# Patient Record
Sex: Female | Born: 1950 | Race: White | Hispanic: No | Marital: Married | Smoking: Never smoker
Health system: Southern US, Community
[De-identification: ages and names within clinical notes are randomized; demographics above are authoritative.]

## PROBLEM LIST (undated history)

## (undated) DIAGNOSIS — M8580 Other specified disorders of bone density and structure, unspecified site: Secondary | ICD-10-CM

## (undated) DIAGNOSIS — K219 Gastro-esophageal reflux disease without esophagitis: Secondary | ICD-10-CM

## (undated) DIAGNOSIS — H3090 Unspecified chorioretinal inflammation, unspecified eye: Secondary | ICD-10-CM

## (undated) DIAGNOSIS — D509 Iron deficiency anemia, unspecified: Secondary | ICD-10-CM

## (undated) DIAGNOSIS — Z8719 Personal history of other diseases of the digestive system: Secondary | ICD-10-CM

## (undated) DIAGNOSIS — I219 Acute myocardial infarction, unspecified: Secondary | ICD-10-CM

## (undated) DIAGNOSIS — N289 Disorder of kidney and ureter, unspecified: Secondary | ICD-10-CM

## (undated) DIAGNOSIS — R790 Abnormal level of blood mineral: Secondary | ICD-10-CM

## (undated) DIAGNOSIS — I10 Essential (primary) hypertension: Secondary | ICD-10-CM

## (undated) DIAGNOSIS — I471 Supraventricular tachycardia, unspecified: Secondary | ICD-10-CM

## (undated) DIAGNOSIS — K222 Esophageal obstruction: Secondary | ICD-10-CM

## (undated) DIAGNOSIS — H547 Unspecified visual loss: Secondary | ICD-10-CM

## (undated) DIAGNOSIS — I213 ST elevation (STEMI) myocardial infarction of unspecified site: Secondary | ICD-10-CM

## (undated) DIAGNOSIS — E785 Hyperlipidemia, unspecified: Secondary | ICD-10-CM

## (undated) DIAGNOSIS — M810 Age-related osteoporosis without current pathological fracture: Secondary | ICD-10-CM

## (undated) DIAGNOSIS — F419 Anxiety disorder, unspecified: Secondary | ICD-10-CM

## (undated) DIAGNOSIS — E039 Hypothyroidism, unspecified: Secondary | ICD-10-CM

## (undated) DIAGNOSIS — E871 Hypo-osmolality and hyponatremia: Secondary | ICD-10-CM

## (undated) DIAGNOSIS — E538 Deficiency of other specified B group vitamins: Secondary | ICD-10-CM

## (undated) DIAGNOSIS — D649 Anemia, unspecified: Secondary | ICD-10-CM

## (undated) DIAGNOSIS — R112 Nausea with vomiting, unspecified: Secondary | ICD-10-CM

## (undated) DIAGNOSIS — Z9889 Other specified postprocedural states: Secondary | ICD-10-CM

## (undated) DIAGNOSIS — N189 Chronic kidney disease, unspecified: Secondary | ICD-10-CM

## (undated) HISTORY — DX: Supraventricular tachycardia, unspecified: I47.10

## (undated) HISTORY — DX: Abnormal level of blood mineral: R79.0

## (undated) HISTORY — PX: COLONOSCOPY: SHX174

## (undated) HISTORY — DX: Esophageal obstruction: K22.2

## (undated) HISTORY — DX: Disorder of kidney and ureter, unspecified: N28.9

## (undated) HISTORY — PX: HERNIA REPAIR: SHX51

## (undated) HISTORY — DX: Unspecified visual loss: H54.7

## (undated) HISTORY — DX: Hypothyroidism, unspecified: E03.9

## (undated) HISTORY — DX: Iron deficiency anemia, unspecified: D50.9

## (undated) HISTORY — DX: Supraventricular tachycardia: I47.1

## (undated) HISTORY — DX: Chronic kidney disease, unspecified: N18.9

## (undated) HISTORY — PX: BACK SURGERY: SHX140

## (undated) HISTORY — PX: UPPER GASTROINTESTINAL ENDOSCOPY: SHX188

## (undated) HISTORY — DX: Hyperlipidemia, unspecified: E78.5

## (undated) HISTORY — PX: CARPAL TUNNEL RELEASE: SHX101

## (undated) HISTORY — DX: Anemia, unspecified: D64.9

## (undated) HISTORY — DX: Hypo-osmolality and hyponatremia: E87.1

## (undated) HISTORY — DX: Other specified disorders of bone density and structure, unspecified site: M85.80

## (undated) HISTORY — DX: Unspecified chorioretinal inflammation, unspecified eye: H30.90

## (undated) HISTORY — DX: Deficiency of other specified B group vitamins: E53.8

## (undated) NOTE — *Deleted (*Deleted)
Transition of Care [facility]) - Initial/Assessment Note    Patient Details  Name: Jessica [name] MRN: [id number] Date of Birth: April 25, 1951  Transition of Care [facility]) CM/SW Contact:    Ella Bodo, RN Phone Number: 05/15/2020, 3:51 PM  Clinical Narrative:  24 y.o. year-old female with a history of CAD with MI and congenital blindness presenting to the ED with chief complaint of fall against countertop. Pt found to have large left abdominal wall hematoma.  PTA, pt independent, lives at home with spouse.  PT recommending OP follow up at discharge.                 Expected Discharge Plan: OP Rehab Barriers to Discharge: Continued Medical Work up   Patient Goals and CMS Choice        Expected Discharge Plan and Services Expected Discharge Plan: OP Rehab   Discharge Planning Services: CM Consult                                          Prior Living Arrangements/Services   Lives with:: Spouse Patient language and need for interpreter reviewed:: Yes Do you feel safe going back to the place where you live?: Yes      Need for Family Participation in Patient Care: Yes (Comment) Care giver support system in place?: Yes (comment)   Criminal Activity/Legal Involvement Pertinent to Current Situation/Hospitalization: No - Comment as needed  Activities of Daily Living Home Assistive Devices/Equipment: Communication device (specify type) (Talking clock) ADL Screening (condition at time of admission) Patient's cognitive ability adequate to safely complete daily activities?: Yes Is the patient deaf or have difficulty hearing?: No Does the patient have difficulty seeing, even when wearing glasses/contacts?: Yes Does the patient have difficulty concentrating, remembering, or making decisions?: No Patient able to express need for assistance with ADLs?: Yes Does the patient have difficulty dressing or bathing?: No Independently performs ADLs?: Yes (appropriate for developmental  age) Does the patient have difficulty walking or climbing stairs?: No Weakness of Legs: None Weakness of Arms/Hands: None  Permission Sought/Granted                  Emotional Assessment Appearance:: Appears stated age Attitude/Demeanor/Rapport: Engaged Affect (typically observed): Accepting Orientation: : Oriented to Self, Oriented to Place, Oriented to  Time, Oriented to Situation      Admission diagnosis:  Fall [W19.XXXA] Anticoagulated [Z79.01] Abdominal wall hematoma [S30.1XXA] Abdominal wall hematoma, initial encounter [S30.1XXA] Patient Active Problem List   Diagnosis Date Noted  . Abdominal wall hematoma 05/13/2020   PCP:  Marton Redwood, MD Pharmacy:  No Pharmacies Listed    Social Determinants of Health (SDOH) Interventions    Readmission Risk Interventions No flowsheet data found.

---
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
## 1983-07-27 HISTORY — PX: CHOLECYSTECTOMY: SHX55

## 1991-07-27 HISTORY — PX: OTHER SURGICAL HISTORY: SHX169

## 1991-07-27 HISTORY — PX: TOTAL ABDOMINAL HYSTERECTOMY W/ BILATERAL SALPINGOOPHORECTOMY: SHX83

## 1998-05-11 ENCOUNTER — Emergency Department (HOSPITAL_COMMUNITY): Admission: EM | Admit: 1998-05-11 | Discharge: 1998-05-11 | Payer: Self-pay | Admitting: Emergency Medicine

## 1999-10-11 ENCOUNTER — Inpatient Hospital Stay (HOSPITAL_COMMUNITY): Admission: EM | Admit: 1999-10-11 | Discharge: 1999-10-16 | Payer: Self-pay | Admitting: Emergency Medicine

## 1999-10-11 ENCOUNTER — Encounter: Payer: Self-pay | Admitting: Otolaryngology

## 1999-10-12 ENCOUNTER — Encounter: Payer: Self-pay | Admitting: Otolaryngology

## 1999-12-16 ENCOUNTER — Encounter: Admission: RE | Admit: 1999-12-16 | Discharge: 1999-12-16 | Payer: Self-pay | Admitting: Surgery

## 1999-12-16 ENCOUNTER — Encounter: Payer: Self-pay | Admitting: Surgery

## 2005-01-12 ENCOUNTER — Encounter: Admission: RE | Admit: 2005-01-12 | Discharge: 2005-01-12 | Payer: Self-pay | Admitting: Internal Medicine

## 2005-05-14 ENCOUNTER — Encounter: Admission: RE | Admit: 2005-05-14 | Discharge: 2005-05-14 | Payer: Self-pay | Admitting: Internal Medicine

## 2005-05-26 ENCOUNTER — Encounter: Admission: RE | Admit: 2005-05-26 | Discharge: 2005-05-26 | Payer: Self-pay | Admitting: Internal Medicine

## 2005-06-07 ENCOUNTER — Encounter (INDEPENDENT_AMBULATORY_CARE_PROVIDER_SITE_OTHER): Payer: Self-pay | Admitting: Specialist

## 2005-06-07 ENCOUNTER — Encounter: Admission: RE | Admit: 2005-06-07 | Discharge: 2005-06-07 | Payer: Self-pay | Admitting: Internal Medicine

## 2005-06-07 ENCOUNTER — Other Ambulatory Visit: Admission: RE | Admit: 2005-06-07 | Discharge: 2005-06-07 | Payer: Self-pay | Admitting: Interventional Radiology

## 2005-07-26 HISTORY — PX: OTHER SURGICAL HISTORY: SHX169

## 2005-07-29 ENCOUNTER — Emergency Department (HOSPITAL_COMMUNITY): Admission: EM | Admit: 2005-07-29 | Discharge: 2005-07-29 | Payer: Self-pay | Admitting: Emergency Medicine

## 2005-07-31 ENCOUNTER — Emergency Department (HOSPITAL_COMMUNITY): Admission: EM | Admit: 2005-07-31 | Discharge: 2005-07-31 | Payer: Self-pay | Admitting: Emergency Medicine

## 2005-09-02 ENCOUNTER — Encounter (INDEPENDENT_AMBULATORY_CARE_PROVIDER_SITE_OTHER): Payer: Self-pay | Admitting: Specialist

## 2005-09-02 ENCOUNTER — Ambulatory Visit (HOSPITAL_COMMUNITY): Admission: RE | Admit: 2005-09-02 | Discharge: 2005-09-03 | Payer: Self-pay | Admitting: Surgery

## 2005-10-17 ENCOUNTER — Emergency Department (HOSPITAL_COMMUNITY): Admission: EM | Admit: 2005-10-17 | Discharge: 2005-10-17 | Payer: Self-pay | Admitting: Emergency Medicine

## 2006-02-01 ENCOUNTER — Encounter: Admission: RE | Admit: 2006-02-01 | Discharge: 2006-02-01 | Payer: Self-pay | Admitting: Internal Medicine

## 2006-04-17 ENCOUNTER — Emergency Department (HOSPITAL_COMMUNITY): Admission: EM | Admit: 2006-04-17 | Discharge: 2006-04-18 | Payer: Self-pay | Admitting: Emergency Medicine

## 2006-12-29 ENCOUNTER — Inpatient Hospital Stay (HOSPITAL_COMMUNITY): Admission: EM | Admit: 2006-12-29 | Discharge: 2006-12-30 | Payer: Self-pay | Admitting: Emergency Medicine

## 2006-12-29 ENCOUNTER — Ambulatory Visit: Payer: Self-pay | Admitting: Cardiology

## 2006-12-30 ENCOUNTER — Encounter: Payer: Self-pay | Admitting: Cardiology

## 2007-01-13 ENCOUNTER — Encounter: Admission: RE | Admit: 2007-01-13 | Discharge: 2007-01-13 | Payer: Self-pay | Admitting: Internal Medicine

## 2007-02-03 ENCOUNTER — Ambulatory Visit: Payer: Self-pay | Admitting: Internal Medicine

## 2007-02-08 ENCOUNTER — Ambulatory Visit: Payer: Self-pay | Admitting: Internal Medicine

## 2007-02-08 DIAGNOSIS — K219 Gastro-esophageal reflux disease without esophagitis: Secondary | ICD-10-CM | POA: Insufficient documentation

## 2007-02-08 DIAGNOSIS — K222 Esophageal obstruction: Secondary | ICD-10-CM | POA: Insufficient documentation

## 2007-02-08 DIAGNOSIS — K449 Diaphragmatic hernia without obstruction or gangrene: Secondary | ICD-10-CM | POA: Insufficient documentation

## 2007-02-16 ENCOUNTER — Ambulatory Visit: Payer: Self-pay | Admitting: Cardiology

## 2007-02-24 ENCOUNTER — Ambulatory Visit: Payer: Self-pay | Admitting: Internal Medicine

## 2007-03-06 ENCOUNTER — Ambulatory Visit (HOSPITAL_COMMUNITY): Admission: RE | Admit: 2007-03-06 | Discharge: 2007-03-06 | Payer: Self-pay | Admitting: Internal Medicine

## 2007-03-06 ENCOUNTER — Encounter: Payer: Self-pay | Admitting: Internal Medicine

## 2007-03-14 ENCOUNTER — Ambulatory Visit: Payer: Self-pay | Admitting: Internal Medicine

## 2007-11-15 DIAGNOSIS — E119 Type 2 diabetes mellitus without complications: Secondary | ICD-10-CM | POA: Insufficient documentation

## 2007-11-15 DIAGNOSIS — H5310 Unspecified subjective visual disturbances: Secondary | ICD-10-CM | POA: Insufficient documentation

## 2007-11-15 DIAGNOSIS — E118 Type 2 diabetes mellitus with unspecified complications: Secondary | ICD-10-CM | POA: Insufficient documentation

## 2007-11-15 DIAGNOSIS — H547 Unspecified visual loss: Secondary | ICD-10-CM | POA: Insufficient documentation

## 2007-11-15 DIAGNOSIS — E785 Hyperlipidemia, unspecified: Secondary | ICD-10-CM | POA: Insufficient documentation

## 2007-11-15 DIAGNOSIS — E039 Hypothyroidism, unspecified: Secondary | ICD-10-CM | POA: Insufficient documentation

## 2010-12-08 ENCOUNTER — Other Ambulatory Visit: Payer: Self-pay | Admitting: Internal Medicine

## 2010-12-08 DIAGNOSIS — Z1231 Encounter for screening mammogram for malignant neoplasm of breast: Secondary | ICD-10-CM

## 2010-12-08 NOTE — Assessment & Plan Note (Signed)
[facility]                         GASTROENTEROLOGY OFFICE NOTE   NAME:Jessica [name], Jessica [name]                      MRN:          [id number]  DATE:02/03/2007                            DOB:          [birth year]    REFERRING PHYSICIAN:  Janalyn Rouse, M.D.   OFFICE CONSULTATION NOTE.   REASON FOR CONSULTATION:  Reflux disease, dysphagia, iron deficiency  anemia.   HISTORY:  This is a 60 year old white female with a history of  hypothyroidism, dyslipidemia, type 2 diabetes mellitus, gastroesophageal  reflux disease complicated by peptic stricture, and visual impairment.  She is referred through the courtesy of Dr. Brigitte Pulse regarding the above  listed problems.  The patient was recently noted to have a microcytic  anemia with a hemoglobin of 9.1, MCV of 78.1.  White blood cell count  and platelets were normal.  Ferritin level was low at 21.  The patient  has been on B12, though I do not see a B12 level documented.  This has  not affected her hemoglobin.  She is intolerant to oral iron therapy.  She has undergone prior GI evaluations in [place].  Approximately 3  years ago she underwent upper endoscopy with esophageal dilatation  because of dysphagia.  This helped.  She also underwent colonoscopy.  She cannot recall that she had any problem such as polyps.  For her  reflux, currently she is on Protonix 40 mg daily.  This controls  heartburn and water brash.  However, she still continues with  intermittent problems with regurgitation.  She has had worsening  intermittent solid food dysphagia over the past 6 months.  Rarely  problems with liquids.  For breakthrough heartburn, she will take Zantac  or an additional Protonix.  There is associated nausea.  She also has  postprandial epigastric discomfort on occasion.  She does tell me that  her stools tend to be loose and she does have a history of  diverticulitis  No lower abdominal pain or fevers currently.  No  melena  or hematochezia.  She has had a slight decrease in her appetite as well  as 5 pound weight loss over the past 3 months.   PAST MEDICAL HISTORY:  As above.   PAST SURGICAL HISTORY:  1. Cholecystectomy.  2. Hysterectomy.   ALLERGIES:  1. LEVAQUIN.  2. ERYTHROMYCIN.  3. CODEINE.   CURRENT MEDICATIONS:  1. Zocor 40 mg daily.  2. Synthroid 50 mcg daily.  3. Cardizem 120 mg daily.  4. Metformin 1000 mg b.i.d.  5. Ativan 0.5 mg daily.  6. Protonix 40 mg daily.  7. Diovan/HCT 160/12.5 mg daily.   FAMILY HISTORY:  Negative for gastrointestinal malignancy.   SOCIAL HISTORY:  1. The patient is married with 4 children.  She is accompanied by her      daughter.  2. She lives with her husband.  3. She is retired from [employer].  4. She does not smoke or use alcohol.   REVIEW OF SYSTEMS:  Per diagnostic evaluation form.   PHYSICAL EXAM:  A pleasant female.  She appears older than her stated  age.  She is alert and oriented and in no acute distress.  Blood  pressure is 124/72, heart rate is 80, weight is 134 pounds.  HEENT:  Sclera anicteric, conjunctiva are pale.  Oral mucosa is intact.  There is no adenopathy.  LUNGS:  Clear.  HEART:  Regular.  ABDOMEN:  Soft without tenderness, mass or hernia.  Good bowel sounds  heard.  EXTREMITIES:  Without edema.   X-RAY FINDINGS:  A CT scan of the abdomen and pelvis without contrast  was performed in September of 2007, for nausea and abdominal pain.  This  was negative.   IMPRESSION:  1. Gastroesophageal reflux disease with recurrent dysphagia likely due      to recurrent peptic stricture.  2. Problems with epigastric pain with meals and mild weight loss.      Possibly due to refractory reflux.  Rule out ulcer disease.  Rule      out neoplasm.  Rule out pancreatic causes.  3. History of diverticulitis.  4. Iron deficiency anemia.  5. Intolerance to oral iron therapy.  6. Multiple other general medical problems as  discussed.   RECOMMENDATIONS:  1. Colonoscopy to evaluate iron deficiency anemia.  The nature of the      procedure as well as the risks, benefits and alternatives have been      reviewed.  She understood and agreed to proceed.  2. Upper endoscopy to evaluate iron deficiency anemia and epigastric      pain.  As well, esophageal dilation to address recurrent dysphagia.      The nature of the procedure as well as the risks, benefits and      alternatives have been reviewed.  She understood and agreed to      proceed.  3. Patient will need iron replacement therapy.  If she cannot tolerate      oral iron therapy then she will need intravenous iron therapy.      This should be under the direction of Dr. Brigitte Pulse.     Docia Chuck. Henrene Pastor, MD  Electronically Signed    JNP/MedQ  DD: 02/03/2007  DT: 02/05/2007  Job #: [id number]   cc:   Janalyn Rouse, M.D.

## 2010-12-08 NOTE — Assessment & Plan Note (Signed)
[facility] OFFICE NOTE   NAME:[name], Jessica DINGA                      MRN:          [id number]  DATE:02/16/2007                            DOB:          [birth year]    Jessica [name] comes back today post hospitalization.  She was admitted  with SVT, terminated with Adenosine via EMS.   Follow-up 2D echo in the office on December 30, 2006 was normal.   She does have several risk factors for coronary disease, including  hypertension, hyperlipidemia, and diabetes.   She has some chest pressure, but it is very hard to know if this is from  her esophageal stricture or reflux.  Sometimes she has classic water  brash and reflux symptoms, but other times she just has a dull ache in  her chest.  It does not seem to be exertional-related.   Although she is legally blind, she is very active.   She is scheduled for an esophageal dilatation with Dr. Henrene Pastor.  Recent  endoscopy and colonoscopy for severe anemia was negative except for  esophageal stricture.   CURRENT MEDICATIONS:  1. Zocor 40 mg a day.  2. Synthroid 50 mcg a day.  3. Cardizem 120 daily.  4. Metformin 1000 mg b.i.d.  5. Ativan 0.5 daily.  6. Protonix 40 mg daily.  7. Diovan/HCTZ 160/12.5 daily.   PHYSICAL EXAMINATION:  Blood pressure today is 126/78.  Her pulse is 60  and irregular.  Her electrocardiogram is normal.  Specifically, a normal  P-R interval.  HEENT:  Normocephalic and atraumatic.  Sclerae are clear.  Facial  symmetry is normal.  NECK:  Carotids are equal bilaterally without bruits.  No JVD.  Thyroid  is not enlarged.  Trachea is midline.  LUNGS:  Clear.  HEART:  A nondisplaced PMI.  She has normal S1 and S2.  No gallop.  ABDOMEN:  Soft with good bowel sounds.  EXTREMITIES:  No edema.  Pulses are intact.  NEUROLOGIC:  Grossly intact.   ASSESSMENT:  1. Supraventricular tachycardia, currently without recurrence on low      dose Cardizem.  2.  Hypertension.  3. Hyperlipidemia.  4. Chest discomfort.   PLAN:  1. Adenosine rest/stress Myoview to rule out obstructive coronary      disease.  2. Esophageal dilatation per Dr. Henrene Pastor.  3. Continue current medications.  4. Follow up with me in a year.   If she continues to have recurrent SVT, her and her daughter were made  aware of the radiofrequency ablation procedure.     Thomas C. Verl Blalock, MD, [facility]  Electronically Signed    TCW/MedQ  DD: 02/16/2007  DT: 02/16/2007  Job #: [id number]   cc:   Janalyn Rouse, M.D.

## 2010-12-08 NOTE — Discharge Summary (Signed)
NAMEREATHER, HALILOVIC NO.:  [id number]   MEDICAL RECORD NO.:  [id number]          PATIENT TYPE:  INP   LOCATION:  [id number]                         FACILITY:  [facility]   PHYSICIAN:  Deboraha Sprang, MD, FACCDATE OF BIRTH:  1951/03/01   DATE OF ADMISSION:  12/29/2006  DATE OF DISCHARGE:  12/30/2006                               DISCHARGE SUMMARY   PRIMARY CARDIOLOGIST:  Dr. Jenell Milliner.   PRIMARY CARE Rosealyn Little:  Dr. Janalyn Rouse.   DISCHARGE DIAGNOSIS:  Supraventricular tachycardia.   SECONDARY DIAGNOSES:  1. Hyperlipidemia.  2. Hypertension.  3. Type 2 diabetes mellitus.  4. Gastroesophageal reflux disease.  5. Hiatal hernia.  6. Cone-rod dystrophy causing blindness.  7. Right colon diverticulitis.  8. History of lung nodules.  9. Anemia with monthly B12 shots.  10.Cesarean section.  11.Right thyroid nodule.  12.Right thyroid lobe removal.  13.Cholecystectomy.  14.Hysterectomy.  15.Bilateral tubal ligation with subsequent reversal.  16.Removal of right ovary secondary to right ovarian pregnancy.   ALLERGIES:  1. CODEINE.  2. ERYTHROMYCIN.  3. LEVAQUIN.   PROCEDURE:  None.   HISTORY OF PRESENT ILLNESS:  A 60 year old Caucasian female who was in  her usual state of health until the evening of December 29, 2006, when while  washing dishes, she developed the sudden onset of tachy-palpitations  with mild nausea and lightheadedness.  She called the primary care  Willia Lampert, who recommended that she active EMS.  Upon their arrival, she  was noted to be in SVT at a rate of 180 beats per minute. She was given  adenosine 6 mg IV x1 with immediate reversion to sinus rhythm. The  patient was taken to [facility] for further evaluation.  ECG in the ED  showed some lateral T-wave flattening in V5 and V6, as well as flattened  T-wave in lead III.  She was admitted for further evaluation.   HOSPITAL COURSE:  [name] ruled out for MI, and has had no  additional  arrhythmias on the monitor.  We have discontinued her Norvasc  in favor of Diltiazem therapy, given her SVT, and she will be discharged  home today in satisfactory condition.   Discharge laboratory studies are hemoglobin 9.7, hematocrit 29.6, WBC  6.3, platelets 492, MCV 77.4.  Sodium 136, potassium 3.9, chloride 102,  CO2 22, BUN 8, creatinine 1.1, glucose 110.  PT 12.2, INR 8.9, PTT 30.  Cardiac enzymes negative x3.  Total cholesterol 126, triglycerides 130,  HDL 34, LDL 66.  Calcium 9.4, magnesium 1.4.  Urinalysis was negative.  TSH 2.409, free T4 1.22, free T3 2.4.   DISPOSITION:  The patient is being discharged home today in good  condition.  Followup plans and appointment:  She is asked to follow up  with her primary care Kannan Proia in 1-2 weeks. She can follow up with  cardiology p.r.n.   DISCHARGE MEDICATIONS:  1. Diltiazem ER 120 mg daily.  2. Diovan 160 mg daily.  3. Hydrochlorothiazide 12.5 mg daily.  4. Zocor 40 mg daily.  5. Metformin 1000 mg b.i.d.  6. Prilosec 20 mg daily.  7. Ativan 0.25 mg p.r.n. at bedtime.  8. B12 injection as previously prescribed.   OUTSTANDING LAB STUDIES:  None.   DURATION OF DISCHARGE ENCOUNTER:  Twenty-five minutes including  physician time.      Murray Hodgkins, ANP      Deboraha Sprang, MD, [facility]  Electronically Signed    CB/MEDQ  D:  12/30/2006  T:  12/30/2006  Job:  [id number]   cc:   Janalyn Rouse, M.D.

## 2010-12-08 NOTE — H&P (Signed)
NAMEFRANI, BOGUE               ACCOUNT NO.:  [id number]   MEDICAL RECORD NO.:  [id number]          PATIENT TYPE:  EMS   LOCATION:  MAJO                         FACILITY:  [facility] [facility]   PHYSICIAN:  Thomas C. Wall, MD, FACCDATE OF BIRTH:  31-Dec-1950   DATE OF ADMISSION:  12/29/2006  DATE OF DISCHARGE:                              HISTORY & PHYSICAL   PRIMARY CARDIOLOGIST:  Marcello Moores C. Verl Blalock, MD, [facility]   PRIMARY CARE PHYSICIAN:  Janalyn Rouse, M.D.   HISTORY OF PRESENT ILLNESS:  This is a 60 year old Caucasian female who,  while washing dishes this morning, began to feel weak and had sudden  onset of diaphoresis and then began to feel her heart racing.  She had  some low-grade nausea and she had a presyncopal episode.  The patient  laid down on the couch and her heart began to race more.  She got up and  she said that she felt worse and her heart rate went up further.  She  called her primary care physician, Dr. Brigitte Pulse, who advised her to call the  EMS squad.  EMS did arrive.  An EKG strip was obtained showing sinus  tachycardia, SVT, at a rate of 180 beats per minute. The patient was  given IV adenosine 6 mg x1 with return to normal sinus rhythm.  There  were no flutter waves noted after adenosine was instituted.  The patient  immediately began to feel better and breathing status improved.  The  patient was transferred to [facility] emergency room where we are asked  to evaluate.   The patient states that she had one episode like this approximately  three years ago and had no cardiac workup at that time.  The patient  states that, over the last two to three years, she continued to have  palpitations on and off but they usually go away on their own and this,  again, was the worst episode she had experienced in the last three  years.   REVIEW OF SYSTEMS:  CARDIAC:  Positive for chest pressure, shortness of  breath, near syncope, diaphoresis and weakness.  Otherwise, negative.   PAST  MEDICAL HISTORY:  Includes:  1. Hiatal hernia  2. GERD.  3. Hypercholesterolemia.  4. Cone-rod dystrophy causing blindness.  5. Right colon diverticulitis.  6. Diabetes.  7. Lung nodules.  8. Hypertension.  9. Anemia with monthly B12 shots.   PAST SURGICAL HISTORY:  1. C-section.  2. Right thyroid nodule.  3. Right thyroid lobe removal.  4. Cholecystectomy.  5. Hysterectomy.  6. Bilateral tubal ligation with subsequent reversal.  7. Removal of right ovary secondary to right ovarian pregnancy.   SOCIAL HISTORY:  The patient lives in [place] with her husband.  She  is retired.  She has four children.  She does not smoke, she does not  drink, she does not use illicit drugs.   FAMILY HISTORY:  Her mother died of CHF and CVA.  Her father died of an  MI at age 52.  She also has a younger sister that has nonobstructive  coronary artery disease.  CURRENT MEDICATIONS:  1. Ativan 0.5 mg t.i.d. p.r.n.  2. Simvastatin 40 mg at h.s.  3. Diovan 160 mg once a day.  4. Metformin 1000 mg b.i.d.  5. Prilosec 40 mg once a day.  6. Norvasc 5 mg once a day.  7. Levothyroxine once a day.  8. B12 shots monthly.   CURRENT LABS:  Hemoglobin 9.7, hematocrit 29.6, white blood cells 6.3,  platelets 492.  Sodium 135, potassium 3.8, chloride 104, CO2 21, BUN 7,  creatinine 0.9, glucose 130.  Point of care markers:  Troponin less than  0.05, CK-MB at less than 1, myoglobin 110.   PHYSICAL EXAMINATION:  VITAL SIGNS:  Blood pressure 153/97, pulse 115,  respirations 19, temperature 97.2, O2 saturation 100% on 2 liters.  GENERAL:  A 60 year old Caucasian female looking older than stated age,  chronically ill appearing, in no acute distress.  HEENT:  Head is normocephalic and atraumatic.  Eyes do move  independently.  NECK:  Supple without bruits.  There is no JVD, no thyromegaly is noted.  CARDIOVASCULAR:  Regular rate and rhythm without murmurs, rubs or  gallops.  Pulses are 1+ and equal  bilaterally.  LUNGS:  Are clear to auscultation.  ABDOMEN:  Is soft with some lower quadrant tenderness noted on  palpation.  EXTREMITIES:  Without clubbing, cyanosis, or edema.  SKIN:  Tanned.  NEUROLOGIC:  Intact with the exception of vision diminished secondary  the above mentioned diagnosis.   EKG revealing normal sinus rhythm with a ventricular rate of 113 beats  per minute.  There is some lateral T-wave flattening in V5, V6 and  flattening of the T wave in lead 3.   IMPRESSION:  1. Supraventricular tachycardia with  recurrent signs and symptoms in      the past.  2. Hypertension.  3. Hypercholesterolemia.  4. Diabetes, type 2.  5. History of thyroid nodule, status post right thyroidectomy.  6. Anemia, multifactorial, with B12 shots.   PLAN:  This is a 60 year old Caucasian female looking older than stated  age who had sudden onset of supraventricular tachycardia which was  converted to normal sinus rhythm with 6 mg of adenosine IV per EMS with  prior history of same event approximately three to four years ago with  no prior cardiac workup.  The patient has been seen and examined by  myself and Dr. Marcello Moores C. Wall in the emergency room.  The patient will  be admitted for observation with cardiovascular risk factors to include  hypertension, hypercholesterolemia, diabetes and family history.  We  will cycle cardiac enzymes.  We will check TSH, T3 and T4.  A 2D  echocardiogram will be completed.  We will discontinue Norvasc and begin  diltiazem 60 mg q.i.d. and monitor the patient's heart rate and response  to treatment.  The patient will have fasting lipids and liver function  tests as well.  We will make further recommendations throughout the  hospital course.  An EP consultation has been requested for further  cardiac intervention if necessary at their discretion after their  assessment.      Phill Myron. Purcell Nails, NP     [place] Verl Blalock, MD, [facility]  Electronically  Signed    KML/MEDQ  D:  12/29/2006  T:  12/29/2006  Job:  [id number]   cc:   Janalyn Rouse, M.D.

## 2010-12-08 NOTE — Consult Note (Signed)
Jessica [name], ALBERTO NO.:  [id number]   MEDICAL RECORD NO.:  [id number]          PATIENT TYPE:  INP   LOCATION:  [id number]                         FACILITY:  [facility]   PHYSICIAN:  Deboraha Sprang, MD, FACCDATE OF BIRTH:  Mar 19, 1951   DATE OF CONSULTATION:  12/30/2006  DATE OF DISCHARGE:                                 CONSULTATION   Thank you very much for asking [place] to see Ms. Higgins in  electrophysiological consultation for recurrent supraventricular  tachycardia.   She his a 60 year old woman with a cone-rod dystrophy causing blindness,  who has multiple cardiac risk factors, including diabetes,  hypercholesterolemia, hypertension, and who has had a recurrent history  over the last 8 years of abrupt onset or offset tachy palpitations.  These are not associated particularly with any position.  Their symptoms  include severe lightheadedness, shortness of breath, chest discomfort  and diaphoresis.  She has had to seek attention on a number of  occasions, but this was the first time that the rhythm was recorded.  Previously, she had been diagnosed with panic attacks.  She also has a  history of nonsustained palpitations.   I should note that these episodes are frog negative and diuretic  negative.   Her cardiac evaluation here to date has included routine stress testing  in Dr. Trena Platt office, which has been unremarkable.   REVIEW OF SYSTEMS:  Otherwise broadly negative.   PAST MEDICAL HISTORY:  Notable for:  1. Hiatal hernia and GE reflux disease.  2. Diverticulitis.  3. Lung nodules.  4. Anemia with B12 shots.   PAST SURGICAL HISTORY:  Notable for:  1. C-section.  2. Thyroid nodule removal.  3. Cholecystectomy.  4. Hysterectomy.  5. Bilaterally tubal ligation with reversal.   SOCIAL HISTORY:  She lives with her husband.  Both of them are visually  impaired.  She has 4 children.   MEDICATIONS:  Included:  1. Simvastatin 40.  2. Diovan 160.  3.  Metformin 100.  4. Norvasc 5.  5. Prilosec 40.  6. Levothyroxine.   PHYSICAL EXAMINATION:  VITAL SIGNS:  Blood pressure is 104/67, her pulse  is 87.  NECK:  Neck veins were flat.  Carotids were brisk.  LUNGS:  Clear.  HEART:  Heart sounds were regular.  ABDOMEN:  Soft with active bowel sounds, without murmurs, rubs or  gallops.  EXTREMITIES:  Distal pulses were intact.  There is no clubbing, cyanosis  or edema.  NEUROLOGICAL:  Notable for disconjugate gaze and visual impairment.   We have no electrocardiogram with a tachycardia.  There is a 3-lead  rhythm strip of the II, III and F of the tachycardia, which __________  cycle length of about 300 milliseconds.  It is not clear where it is  inscribed the retrograde P wave.  There appears to be an upward  deflection in the proximal portion of the ST segment.   Her cardiac enzymes were negative.   Other laboratories were notable for a normal creatinine.  Hemoglobin was  11.  LDL was 66 with an HDL of 34.   IMPRESSION:  1. 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  2. Multiple cardiac risk factors, including:      a.     Hypertension.      b.     Hypercholesterolemia.      c.     Diabetes.  3. Visual impairment.  4. Anemia.   [name] has recurrent supraventricular tachycardia.  These episodes  are infrequent, but quite symptomatic.  We discussed a variety of  treatment options, including daily medication, p.r.n. medication,  carotid sinus maneuvers as well as EP study and RF catheter ablation.  At this point, she would like to just continue on medical course.  I  think that this is reasonable.  Dr. Winnifred Friar recommendation was to change  the Norvasc to a long-acting, non-dihydropyridine calcium blocker, which  I think is very reasonable.  So we will plan to discharge her on  Cardizem 120.   She is to follow up with Dr. Manuella Ghazi.  We will plan to see her again as  needed.   We will plan to review her echo as  well.      Deboraha Sprang, MD, [facility]  Electronically Signed     SCK/MEDQ  D:  12/30/2006  T:  12/30/2006  Job:  [id number]   cc:   Bimal R. Manuella Ghazi, MD

## 2010-12-11 NOTE — H&P (Signed)
[place]. [facility]  Patient:    Jessica [name], LABAN                     MRN: [id number] Adm. Date:  10/11/99 Attending:  Fenton Malling. Lucia Gaskins, M.D. CC:         [facility]                         History and Physical  DATE OF BIRTH:  1950/12/21.  HISTORY OF PRESENT ILLNESS:  This is a 60 year old white female who identifies he [facility] as her primary physicians.  She started having vague diffuse abdominal pain on Friday evening, October 09, 1999.  She had some nausea with this. The pain was fairly diffuse but seemed to localize more to the right lower quadrant as time went by.  She went to see [facility] this morning.  There was a concern  about a possible appendicitis, more need for further evaluation, and was sent to [facility] Emergency Room.  She has had some nausea and vomiting.  She has had some loose stools this morning. She has had multiple prior abdominal operations which include as best I can tell a cholecystectomy in 1985.  She had tubal ligation then reversal of a tubal ligation. She had a right ovarian pregnancy which required removal of right tube and ovary. She had a cesarean then had a vaginal hysterectomy in 1991 that required a left  ovarian removal in 1993.  She cannot remember specifically if her appendix was ever removed.  She has had a history of reflux with a hiatal hernia for which she is on Zantac. She had jaundice as a teenager but had no further liver trouble or problem. She has had no pancreatic trouble and no prior colonic trouble that she is aware of  such as diverticulosis.  PAST MEDICAL HISTORY:  ALLERGIES:  She is allergic to both ERYTHROMYCIN and LEVAQUIN which lead to shortness of breath.  CURRENT MEDICATIONS:  Zantac and Premarin.  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  Pulmonary: She has no history of pneumonia or  tuberculosis though she had pneumonia as a teenager.  Cardiac: She says she has had some rapid heartbeats but has never been evaluated by a cardiologist.  Her last event was ome two years ago.  Gastrointestinal: See history of present illness.  Urologic: No  history of kidney infections.  SOCIAL HISTORY:  She works at the [employer] center and she is accompanied by her daughter.  PHYSICAL EXAMINATION:  VITAL SIGNS:  Her temperature is 100.4, pulse 111, respirations 20.  GENERAL:  She is a well-nourished, white female.  HEENT:  Kind of wandering nonconjugate eyes.  NECK:  Her neck is supple.  She has no masses or thyromegaly.  LUNGS:  Her lungs are clear to auscultation.  ABDOMEN:  Her abdomen is mildly distended.  She is slightly obese.  She has a right upper quadrant incision and a lower midline incision.  She has bowel sounds present on the left side of her abdomen.  She has some tenderness referred to her right  lower quadrant.  She has some guarding but not really much rebound.  It is attributed to the right lower quadrant.  I do not feel any masses.  I do not see any hernias.  RECTAL:  Her rectal exam reveals guaiac-negative stool without  palpable rectal mass.  LABORATORY DATA:  She has a white blood count of 12,200 with 79% neutrophils. er serum electrolytes were okay.  IMPRESSION: 1. Abdominal pain of unclear etiology.  I think there is about a 60% chance of    having appendicitis.  I have a big question whether she actually even has an    appendix.  She certainly has some symptoms that would go along with    gastroenteritis such as her diarrheal stools.  Because of her age and her    somewhat atypical symptoms, a question of whether she has an appendix, I am    going to get a CT of her abdomen first. 2. Reflux - on Zantac. 3. Retinitis pigmentosa. 4. Multiple prior abdominal operations. DD:  10/11/99 TD:  10/11/99 Job: [id number]

## 2010-12-11 NOTE — Discharge Summary (Signed)
[place]. [facility]  Patient:    Jessica [name], Jessica [name]                        MRN: [id number] Adm. Date:  10/11/99 Disc. Date: 10/16/99 Attending:  Fenton Malling. Lucia Gaskins, M.D.                           Discharge Summary  DATE OF BIRTH:  June 08, 1952  DISCHARGE DIAGNOSES:  1. Right colon diverticulitis.  2. Reflux esophagitis.  3. Retinitis pigmentosa.  4. Remote history of tachycardia without specific diagnosis.  5. Jaundice as a teenager.  OPERATION PERFORMED:  None.  HISTORY OF PRESENT ILLNESS:  The patient is a 60 year old white female patient of [facility] who started having abdominal pain on the evening of October 09, 1999.  She describes the pain as being diffuse in her abdomen and causing some nausea, then localized to her right side right lower quadrant.  She was seen in [place] then sent to the [facility] Emergency Department for further evaluation.  She had had multiple prior abdominal operations including cholecystectomy in 1985, a tubal ligation and then a reversal of her tubal ligation.  She had a right ovarian pregnancy.  She had a vaginal hysterectomy in 1991 and then a left ovary removed in 1993.  She was unsure whether she still had her appendix through all these operations.  PAST MEDICAL HISTORY:  She has allergies to ERYTHROMYCIN and LEVAQUIN.  CURRENT MEDICATIONS:  Zantac and Premarin.  REVIEW OF SYSTEMS:  Significant in that she had retinitis pigmentosa.  She had a history of some tachycardia some two years prior to this admission but had no cardiac evaluation.  She remembers being jaundiced as a teenager.  PHYSICAL EXAMINATION:  VITAL SIGNS:  Her temperature is 100.4.  She had good bowel sounds with diffuse tenderness probably more localized to the right lower quadrant and a white blood cell count of 12,200.  She underwent a CT scan which showed inflammatory changes involving the cecum and right ascending  colon which was felt to be most likely due to a right-sided diverticulitis.  She was placed on IV antibiotics and cefotetan, kept n.p.o. and over the next two or three days, her pain and symptoms slowly improved.  About the third hospital day.  Her white blood cell count was 5000 with a normal differential.  She had minimal tenderness. Diet was advanced. By the fifth postoperative day she was afebrile.  She had no abdominal tenderness.  She was tolerating her diet well.  Her last white blood cell count was 3600.  She was ready for discharge.  DISCHARGE INSTRUCTIONS:  Being on Augmentin for seven more days.  She will resume her home medication.  She will be on a low residue diet.  She will see me back in two weeks with probable plan to do either colonoscopy or barium enema some six to eight weeks after discharge to make sure there is no other identified problem with the right colon. DD:  07/28/00 TD:  07/28/00 Job: [id number]

## 2010-12-11 NOTE — Op Note (Signed)
Jessica, [name]               ACCOUNT NO.:  [id number]   MEDICAL RECORD NO.:  [id number]          PATIENT TYPE:  OIB   LOCATION:  2550                         FACILITY:  [facility]   PHYSICIAN:  Fenton Malling. Lucia Gaskins, M.D.  DATE OF BIRTH:  1951-05-04   DATE OF PROCEDURE:  09/02/2005  DATE OF DISCHARGE:                                 OPERATIVE REPORT   PREOPERATIVE DIAGNOSIS:  Right thyroid nodule.   POSTOPERATIVE DIAGNOSIS:  Right superior pole thyroid nodule, benign on  frozen section by Dr. Hetty Ely.   PROCEDURE:  Right thyroid lobectomy.   SURGEON:  Fenton Malling. Lucia Gaskins, M.D.   FIRST ASSISTANT:  Timothy E. Rosana Hoes, M.D.   ANESTHESIA:  General endotracheal.   ESTIMATED BLOOD LOSS:  Minimal.   INDICATIONS FOR PROCEDURE:  Ms. Jessica [name] is a 60 year old white female who  has a right thyroid nodule which gives her some sensations of choking.  On  biopsy, has some follicular lesion but no clear malignant changes.  She now  comes for excision of this right thyroid mass.   The indication and potential complications were explained to the patient.  Potential complications include but are not limited to bleeding, infection,  recurrent laryngeal nerve injury and parathyroid injury.   OPERATIVE NOTE:  The patient was placed in a supine position with her head  mildly hyperextended with a roll under her shoulders to expose her neck.  The neck was prepped with Betadine solution and sterilely draped.  A skin  incision was made approximately 2.5 cm above her supracervical notch.  Sharp  dissection was carried down through the platysma muscle.  Then, the platysma  muscle was elevated superiorly and inferiorly, superiorly to the thyroid  cartilage notch, inferiorly to the sternal notch.  The strap muscles were  then divided in the midline, and sharp dissection carried down to the  thyroid gland itself.   Attention was turned mainly to the right thyroid gland.  It was noted during  the dissection  that she had exceptionally fragile tissues, something like  someone who is much older  or on chronic steroids.   I identified the upper pole, which I ligated with a 2-0 silk suture and  doubly endoclipped.  I identified the recurrent laryngeal nerve that I  thought I dropped posterior to the dissection.  Interestingly, the thyroid  itself was doubled back, where the upper pole was actually posterior to the  main thyroid gland, and it was bent in a 180-degree fashion, but once I got  the upper pole freed I was able to flip up this nodule on the upper pole.  I  divided this with baby clips.  I never did identify a parathyroid gland on  the right side.  However, there was a piece of fat off the inferior pole  that would probably housed a parathyroid gland.  Again, I thought I had  identified the recurrent laryngeal nerve and stayed away from this during  the dissection.   I divided the thyroid at the isthmus.  The isthmus was only about 1 cm in  length and fairly  tenuous.  I then divided the right thyroid gland and sent  it to pathology.  I ligated the isthmus with a 3-0 chromic suture.   Dr. Hetty Ely said the lesion looked like a cystic lesion with some  fibrosis.  He said it was benign initially, with final pathology pending.   I then irrigated the wound with saline, placed Surgicel in the bed of the  thyroid gland.  There was no bleeding noted.  I then closed the strap  muscles with interrupted 3-0 Vicryl sutures.  I closed the platysma with 3-0  Vicryl sutures and the skin with a 5-0 Vicryl.  I painted the wound with  tincture of Benzoin and Steri-Stripped it.   The patient tolerated the procedure well and was transported to the recovery  room in good condition.  Sponge and needle count were correct at the end of  the case.      Fenton Malling. Lucia Gaskins, M.D.  Electronically Signed     DHN/MEDQ  D:  09/02/2005  T:  09/02/2005  Job:  [id number]   cc:   Janalyn Rouse, M.D.  Fax:  [phone]

## 2010-12-16 ENCOUNTER — Ambulatory Visit
Admission: RE | Admit: 2010-12-16 | Discharge: 2010-12-16 | Disposition: A | Discharge status: Home / Self Care | Payer: Medicare HMO | Source: Ambulatory Visit | Attending: Internal Medicine | Admitting: Internal Medicine

## 2010-12-16 DIAGNOSIS — Z1231 Encounter for screening mammogram for malignant neoplasm of breast: Secondary | ICD-10-CM

## 2010-12-17 ENCOUNTER — Other Ambulatory Visit: Payer: Self-pay | Admitting: Internal Medicine

## 2010-12-17 DIAGNOSIS — R928 Other abnormal and inconclusive findings on diagnostic imaging of breast: Secondary | ICD-10-CM

## 2010-12-25 ENCOUNTER — Ambulatory Visit
Admission: RE | Admit: 2010-12-25 | Discharge: 2010-12-25 | Disposition: A | Discharge status: Home / Self Care | Payer: Medicare HMO | Source: Ambulatory Visit | Attending: Internal Medicine | Admitting: Internal Medicine

## 2010-12-25 DIAGNOSIS — R928 Other abnormal and inconclusive findings on diagnostic imaging of breast: Secondary | ICD-10-CM

## 2010-12-30 ENCOUNTER — Other Ambulatory Visit: Payer: Self-pay | Admitting: Internal Medicine

## 2010-12-30 DIAGNOSIS — N63 Unspecified lump in unspecified breast: Secondary | ICD-10-CM

## 2011-01-05 ENCOUNTER — Other Ambulatory Visit: Payer: Self-pay | Admitting: Internal Medicine

## 2011-01-05 ENCOUNTER — Ambulatory Visit
Admission: RE | Admit: 2011-01-05 | Discharge: 2011-01-05 | Disposition: A | Discharge status: Home / Self Care | Payer: Medicare HMO | Source: Ambulatory Visit | Attending: Internal Medicine | Admitting: Internal Medicine

## 2011-01-05 ENCOUNTER — Other Ambulatory Visit: Payer: Self-pay | Admitting: Radiology

## 2011-01-05 DIAGNOSIS — N63 Unspecified lump in unspecified breast: Secondary | ICD-10-CM

## 2011-05-13 LAB — CBC
HCT: 29.6 — ABNORMAL LOW
Hemoglobin: 9.1 — ABNORMAL LOW
MCV: 77.4 — ABNORMAL LOW
Platelets: 492 — ABNORMAL HIGH
RBC: 3.65 — ABNORMAL LOW
RBC: 3.83 — ABNORMAL LOW
RDW: 15.6 — ABNORMAL HIGH
RDW: 16 — ABNORMAL HIGH
WBC: 6.3

## 2011-05-13 LAB — CK TOTAL AND CKMB (NOT AT ARMC)
CK, MB: 0.8
Total CK: 77

## 2011-05-13 LAB — I-STAT 8, (EC8 V) (CONVERTED LAB)
Bicarbonate: 21.5
Chloride: 104
Glucose, Bld: 130 — ABNORMAL HIGH
Hemoglobin: 11.2 — ABNORMAL LOW
pH, Ven: 7.531 — ABNORMAL HIGH

## 2011-05-13 LAB — MAGNESIUM: Magnesium: 1.4 — ABNORMAL LOW

## 2011-05-13 LAB — DIFFERENTIAL
Basophils Relative: 0
Eosinophils Absolute: 0.1
Lymphocytes Relative: 22
Monocytes Relative: 11
Neutro Abs: 4.1

## 2011-05-13 LAB — URINALYSIS, ROUTINE W REFLEX MICROSCOPIC
Bilirubin Urine: NEGATIVE
Hgb urine dipstick: NEGATIVE
Ketones, ur: NEGATIVE
Protein, ur: NEGATIVE
pH: 7.5

## 2011-05-13 LAB — T4, FREE: Free T4: 1.22

## 2011-05-13 LAB — POCT CARDIAC MARKERS
CKMB, poc: <1 — ABNORMAL LOW
Myoglobin, poc: 110
Operator id: 146091

## 2011-05-13 LAB — CARDIAC PANEL(CRET KIN+CKTOT+MB+TROPI)
CK, MB: 0.7
CK, MB: 0.8
Total CK: 68

## 2011-05-13 LAB — POCT I-STAT CREATININE
Creatinine, Ser: 0.9
Operator id: 146091

## 2011-05-13 LAB — BASIC METABOLIC PANEL
Calcium: 9.4
Chloride: 102
GFR calc non Af Amer: 51 — ABNORMAL LOW
Sodium: 136

## 2011-05-13 LAB — LIPID PANEL
HDL: 34 — ABNORMAL LOW
LDL Cholesterol: 66
Total CHOL/HDL Ratio: 3.7
Triglycerides: 130
VLDL: 26

## 2011-05-13 LAB — T3, FREE: T3, Free: 2.4 (ref 2.3–4.2)

## 2011-12-17 ENCOUNTER — Ambulatory Visit (HOSPITAL_COMMUNITY)
Admission: RE | Admit: 2011-12-17 | Discharge: 2011-12-17 | Disposition: A | Discharge status: Home / Self Care | Payer: Medicare HMO | Source: Ambulatory Visit | Attending: Internal Medicine | Admitting: Internal Medicine

## 2011-12-17 ENCOUNTER — Encounter (HOSPITAL_COMMUNITY): Payer: Self-pay

## 2011-12-17 DIAGNOSIS — M810 Age-related osteoporosis without current pathological fracture: Secondary | ICD-10-CM | POA: Insufficient documentation

## 2011-12-17 HISTORY — DX: Essential (primary) hypertension: I10

## 2011-12-17 HISTORY — DX: Gastro-esophageal reflux disease without esophagitis: K21.9

## 2011-12-17 HISTORY — DX: Other specified postprocedural states: R11.2

## 2011-12-17 HISTORY — DX: Personal history of other diseases of the digestive system: Z87.19

## 2011-12-17 HISTORY — DX: Anxiety disorder, unspecified: F41.9

## 2011-12-17 HISTORY — DX: Other specified postprocedural states: Z98.890

## 2011-12-17 MED ORDER — ZOLEDRONIC ACID 5 MG/100ML IV SOLN
5.0000 mg | Freq: Once | INTRAVENOUS | Status: AC
  Administered 2011-12-17: 5 mg via INTRAVENOUS
  Filled 2011-12-17: qty 100

## 2011-12-17 MED ORDER — SODIUM CHLORIDE 0.9 % IV SOLN
Freq: Once | INTRAVENOUS | Status: AC
  Administered 2011-12-17: 250 mL via INTRAVENOUS

## 2011-12-17 NOTE — Discharge Instructions (Signed)
Zoledronic Acid injection (Paget's Disease, Osteoporosis) What is this medicine? ZOLEDRONIC ACID (ZOE le dron ik AS id) lowers the amount of calcium loss from bone. It is used to treat Paget's disease and osteoporosis in women. This medicine may be used for other purposes; ask your health care provider or pharmacist if you have questions. What should I tell my health care provider before I take this medicine? They need to know if you have any of these conditions: -aspirin-sensitive asthma -dental disease -kidney disease -low levels of calcium in the blood -past surgery on the parathyroid gland or intestines -an unusual or allergic reaction to zoledronic acid, other medicines, foods, dyes, or preservatives -pregnant or trying to get pregnant -breast-feeding How should I use this medicine? This medicine is for infusion into a vein. It is given by a health care professional in a hospital or clinic setting. Talk to your pediatrician regarding the use of this medicine in children. This medicine is not approved for use in children. Overdosage: If you think you have taken too much of this medicine contact a poison control center or emergency room at once. NOTE: This medicine is only for you. Do not share this medicine with others. What if I miss a dose? It is important not to miss your dose. Call your doctor or health care professional if you are unable to keep an appointment. What may interact with this medicine? -certain antibiotics given by injection -NSAIDs, medicines for pain and inflammation, like ibuprofen or naproxen -some diuretics like bumetanide, furosemide -teriparatide This list may not describe all possible interactions. Give your health care provider a list of all the medicines, herbs, non-prescription drugs, or dietary supplements you use. Also tell them if you smoke, drink alcohol, or use illegal drugs. Some items may interact with your medicine. What should I watch for while  using this medicine? Visit your doctor or health care professional for regular checkups. It may be some time before you see the benefit from this medicine. Do not stop taking your medicine unless your doctor tells you to. Your doctor may order blood tests or other tests to see how you are doing. Women should inform their doctor if they wish to become pregnant or think they might be pregnant. There is a potential for serious side effects to an unborn child. Talk to your health care professional or pharmacist for more information. You should make sure that you get enough calcium and vitamin D while you are taking this medicine. Discuss the foods you eat and the vitamins you take with your health care professional. Some people who take this medicine have severe bone, joint, and/or muscle pain. This medicine may also increase your risk for a broken thigh bone. Tell your doctor right away if you have pain in your upper leg or groin. Tell your doctor if you have any pain that does not go away or that gets worse. What side effects may I notice from receiving this medicine? Side effects that you should report to your doctor or health care professional as soon as possible: -allergic reactions like skin rash, itching or hives, swelling of the face, lips, or tongue -breathing problems -changes in vision -feeling faint or lightheaded, falls -jaw burning, cramping, or pain -muscle cramps, stiffness, or weakness -trouble passing urine or change in the amount of urine Side effects that usually do not require medical attention (report to your doctor or health care professional if they continue or are bothersome): -bone, joint, or muscle pain -fever -  irritation at site where injected -loss of appetite -nausea, vomiting -stomach upset -tired This list may not describe all possible side effects. Call your doctor for medical advice about side effects. You may report side effects to FDA at 1-800-FDA-1088. Where  should I keep my medicine? This drug is given in a hospital or clinic and will not be stored at home. NOTE: This sheet is a summary. It may not cover all possible information. If you have questions about this medicine, talk to your doctor, pharmacist, or health care provider.  2012, Elsevier/Gold Standard. (01/08/2011 9:08:15 AM) 

## 2013-03-09 ENCOUNTER — Ambulatory Visit (HOSPITAL_COMMUNITY): Admission: RE | Admit: 2013-03-09 | Payer: Medicare HMO | Source: Ambulatory Visit

## 2013-03-09 ENCOUNTER — Other Ambulatory Visit (HOSPITAL_COMMUNITY): Payer: Self-pay | Admitting: Internal Medicine

## 2013-03-16 ENCOUNTER — Ambulatory Visit (HOSPITAL_COMMUNITY)
Admission: RE | Admit: 2013-03-16 | Discharge: 2013-03-16 | Disposition: A | Discharge status: Home / Self Care | Payer: Medicare HMO | Source: Ambulatory Visit | Attending: Internal Medicine | Admitting: Internal Medicine

## 2013-03-16 ENCOUNTER — Encounter (HOSPITAL_COMMUNITY): Payer: Self-pay

## 2013-03-16 DIAGNOSIS — M810 Age-related osteoporosis without current pathological fracture: Secondary | ICD-10-CM | POA: Insufficient documentation

## 2013-03-16 DIAGNOSIS — H543 Unqualified visual loss, both eyes: Secondary | ICD-10-CM | POA: Insufficient documentation

## 2013-03-16 HISTORY — DX: Age-related osteoporosis without current pathological fracture: M81.0

## 2013-03-16 MED ORDER — ZOLEDRONIC ACID 5 MG/100ML IV SOLN
5.0000 mg | Freq: Once | INTRAVENOUS | Status: AC
  Administered 2013-03-16: 5 mg via INTRAVENOUS
  Filled 2013-03-16: qty 100

## 2013-03-16 MED ORDER — SODIUM CHLORIDE 0.9 % IV SOLN
Freq: Once | INTRAVENOUS | Status: AC
  Administered 2013-03-16: 11:00:00 via INTRAVENOUS

## 2013-05-15 ENCOUNTER — Other Ambulatory Visit (HOSPITAL_COMMUNITY): Payer: Self-pay | Admitting: Internal Medicine

## 2013-05-15 ENCOUNTER — Encounter: Payer: Self-pay | Admitting: Nurse Practitioner

## 2013-05-15 DIAGNOSIS — R112 Nausea with vomiting, unspecified: Secondary | ICD-10-CM

## 2013-05-21 ENCOUNTER — Ambulatory Visit (INDEPENDENT_AMBULATORY_CARE_PROVIDER_SITE_OTHER): Payer: Medicare HMO | Admitting: Nurse Practitioner

## 2013-05-21 ENCOUNTER — Encounter: Payer: Self-pay | Admitting: *Deleted

## 2013-05-21 VITALS — BP 124/60 | HR 92 | Ht 61.5 in | Wt 124.0 lb

## 2013-05-21 DIAGNOSIS — R110 Nausea: Secondary | ICD-10-CM

## 2013-05-21 DIAGNOSIS — R1310 Dysphagia, unspecified: Secondary | ICD-10-CM

## 2013-05-21 DIAGNOSIS — R112 Nausea with vomiting, unspecified: Secondary | ICD-10-CM | POA: Insufficient documentation

## 2013-05-21 DIAGNOSIS — K219 Gastro-esophageal reflux disease without esophagitis: Secondary | ICD-10-CM

## 2013-05-21 NOTE — Patient Instructions (Signed)
You have been scheduled for an endoscopy with propofol. Please follow written instructions given to you at your visit today. If you use inhalers (even only as needed), please bring them with you on the day of your procedure. 

## 2013-05-21 NOTE — Progress Notes (Signed)
Agree with initial assessment and plans 

## 2013-05-21 NOTE — Progress Notes (Signed)
HPI :  Patient is a 62 year old female known remotely to Dr. Henrene Pastor. She was evaluated in Aug 2008 for dysphagia and found on EGD to have a stricture. 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  Patient referred for evaluation of nausea, vomiting and recurrent solid food dysphagia, a two month history of nausea and vomiting, and heartburn refractory to BID PPI plus an H2 antagonist. Zofran doesn't help. Phenergan makes her sleepy though she does take it. Weight is stable. CBGs in 90's. Patient is scheduled for gastric emptying study on Thursday. She hasn't started any new medications recently. No involuntary weight loss. She had a normal colonoscopy (done for anemia) in 2008.   Past Medical History  Diagnosis Date  . Postoperative nausea and vomiting   . Hypertension   . Diabetes mellitus   . Anxiety   . GERD (gastroesophageal reflux disease)   . H/O hiatal hernia   . Diverticula of small intestine   . Osteoporosis   . Hyperlipidemia   . Hypothyroidism   . Iron deficiency anemia   . Congenital blindness   . Retinitis   . CKD (chronic kidney disease)     CKD III per PCP notes    Family History  Problem Relation Age of Onset  . Coronary artery disease Father   . COPD Father   . CVA Mother   . Diabetes Mellitus II Mother   . Diabetes Mellitus II Maternal Grandmother   . Uterine cancer Sister   . Hypertension Sister     Brother also  . Hyperlipidemia Sister     Brother also  . Colon cancer Neg Hx    History  Substance Use Topics  . Smoking status: Never Smoker   . Smokeless tobacco: Never Used  . Alcohol Use: No   Current Outpatient Prescriptions  Medication Sig Dispense Refill  . atorvastatin (LIPITOR) 20 MG tablet Take 20 mg by mouth daily.      . Calcium Carbonate-Vitamin D (CALTRATE 600+D PO) Take by mouth 2 (two) times daily. One tablet by mouth twice daily      . cholecalciferol (VITAMIN D) 1000 UNITS tablet Take  1,000 Units by mouth 2 (two) times daily.      . diclofenac sodium (VOLTAREN) 1 % GEL Apply 4 g topically every 6 (six) hours as needed.      Marland Kitchen levothyroxine (SYNTHROID, LEVOTHROID) 50 MCG tablet Take 50 mcg by mouth daily.      Marland Kitchen LORazepam (ATIVAN) 0.5 MG tablet Take 0.5 mg by mouth daily.      . metFORMIN (GLUCOPHAGE) 1000 MG tablet Take 1,000 mg by mouth 2 (two) times daily with a meal.      . metoprolol succinate (TOPROL-XL) 25 MG 24 hr tablet Take 25 mg by mouth daily.      . Omeprazole ([employer] OMEPRAZOLE) 20 MG TBEC Take 20 mg by mouth 2 (two) times daily.      . promethazine (PHENERGAN) 25 MG tablet Take 25 mg by mouth every 8 (eight) hours as needed for nausea.      . ranitidine (ZANTAC) 300 MG tablet Take 300 mg by mouth daily.      . sertraline (ZOLOFT) 50 MG tablet Take 50 mg by mouth daily.      . traMADol (ULTRAM) 50 MG tablet Take 50 mg by mouth every 8 (eight) hours as needed for pain.      . valsartan-hydrochlorothiazide (DIOVAN-HCT) 320-25 MG  per tablet Take 1 tablet by mouth daily.      . zoledronic acid (RECLAST) 5 MG/100ML SOLN injection Inject 5 mg into the vein once. Once per year       No current facility-administered medications for this visit.   Allergies  Allergen Reactions  . Codeine   . Erythromycin    Review of Systems: Positive for muscle pain, swelling of feet / legs. All other systems reviewed and negative except where noted in HPI.   Physical Exam: BP 124/60  Pulse 92  Ht 5' 1.5" (1.562 m)  Wt 124 lb (56.246 kg)  BMI 23.05 kg/m2 Constitutional: Pleasant,well-developed, white female in no acute distress. HEENT: Normocephalic and atraumatic. Conjunctivae are normal. No scleral icterus. Neck supple.  Cardiovascular: Normal rate, regular rhythm.  Pulmonary/chest: Effort normal and breath sounds normal. No wheezing, rales or rhonchi. Abdominal: Soft, nondistended, nontender. Bowel sounds active throughout. There are no masses palpable. No  hepatomegaly. Extremities: trace BLE edema Lymphadenopathy: No cervical adenopathy noted. Neurological: Alert and oriented to person place and time. Skin: Skin is warm and dry. No rashes noted. Psychiatric: Normal mood and affect. Behavior is normal.   ASSESSMENT AND PLAN: 102. 62 year old female with 6 month history of solid food dysphagia. She is having breakthrough heartburn on high dose PPI plus H2 antagonist.We dilated a distal esophageal stricture in 2008. Rule out recurrent stricture. For further evaluation patient will be scheduled for EGD. The benefits, risks, and potential complications of EGD with possible biopsies and/or dilation were discussed with the patient and she agrees to proceed.   2. Two month history of nausea and vomiting, mainly postprandial.. Further evaluation at time of EGD. PCP scheduled her for gastric emptying study on Thursday  3. GERD, symptoms suboptimally controlled on high dose PPI plus an H2 antagonish  3. Colon cancer screening. She is up to date on screening.   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

## 2013-05-22 ENCOUNTER — Encounter: Payer: Self-pay | Admitting: Internal Medicine

## 2013-05-24 ENCOUNTER — Encounter (HOSPITAL_COMMUNITY): Payer: Medicare HMO

## 2013-05-28 ENCOUNTER — Telehealth: Payer: Self-pay | Admitting: Internal Medicine

## 2013-05-28 NOTE — Telephone Encounter (Signed)
Jessica [name], Agree if vomiting blood then should go to ED. She is scheduled for EGD for evaluation of nausea and dysphagia. Her PCP ordered a gastric emptying study but for some reason it was cancelled??  Hopefully EGD will give [place] some answers.

## 2013-05-28 NOTE — Telephone Encounter (Signed)
Spoke with patient and she states she had nausea and vomiting when riding the [place] to work. She took Phenergan after she had nausea but still vomited. She is at home now resting and is feeling better. She states she thought she tasted blood when she vomited but it was dark and she could not see it. Patient advised that if she is vomiting blood she will need to go to ED. She reports she is not vomiting now. She has eaten dry toast and is sipping on water. Suggested she try taking the Phenergan on a routine basis to avoid nausea and vomiting. She is taking her Omeprazole and Zantac.

## 2013-05-28 NOTE — Telephone Encounter (Signed)
Patient notified of recommendations. 

## 2013-06-06 ENCOUNTER — Encounter: Payer: Self-pay | Admitting: Internal Medicine

## 2013-06-06 ENCOUNTER — Ambulatory Visit (AMBULATORY_SURGERY_CENTER): Payer: Medicare HMO | Admitting: Internal Medicine

## 2013-06-06 VITALS — BP 116/78 | HR 81 | Temp 96.8°F | Resp 16 | Ht 61.5 in | Wt 124.0 lb

## 2013-06-06 DIAGNOSIS — K219 Gastro-esophageal reflux disease without esophagitis: Secondary | ICD-10-CM

## 2013-06-06 DIAGNOSIS — K222 Esophageal obstruction: Secondary | ICD-10-CM

## 2013-06-06 DIAGNOSIS — R112 Nausea with vomiting, unspecified: Secondary | ICD-10-CM

## 2013-06-06 DIAGNOSIS — R1310 Dysphagia, unspecified: Secondary | ICD-10-CM

## 2013-06-06 LAB — GLUCOSE, CAPILLARY
Glucose-Capillary: 95 mg/dL (ref 70–99)
Glucose-Capillary: 103 mg/dL — ABNORMAL HIGH (ref 70–99)

## 2013-06-06 MED ORDER — SODIUM CHLORIDE 0.9 % IV SOLN: 500.0000 mL | INTRAVENOUS | Status: DC

## 2013-06-06 NOTE — Progress Notes (Signed)
Called to room to assist during endoscopic procedure.  Patient ID and intended procedure confirmed with present staff. Received instructions for my participation in the procedure from the performing physician.  

## 2013-06-06 NOTE — Progress Notes (Signed)
Patient did not have preoperative order for IV antibiotic SSI prophylaxis. (G8918)  Patient did not experience any of the following events: a burn prior to discharge; a fall within the facility; wrong site/side/patient/procedure/implant event; or a hospital transfer or hospital admission upon discharge from the facility. (G8907)  

## 2013-06-06 NOTE — Progress Notes (Signed)
No egg or soy allergy. ewm Pt states she has hx of post op nausea/vomiting. ewm

## 2013-06-06 NOTE — Op Note (Signed)
[facility]  [address] [place], [id number]   ENDOSCOPY PROCEDURE REPORT  PATIENT: Jessica, [name].  MR#: [id number] BIRTHDATE: [birth year] , 36  yrs. old GENDER: Female ENDOSCOPIST: Eustace Quail, MD REFERRED BY:  Alva Garnet, M.D. PROCEDURE DATE:  06/06/2013 PROCEDURE:  Balloon dilation of esophagus - 15,16.5,29mm ASA CLASS:     Class III INDICATIONS:  Nausea.   Vomiting.   Dysphagia.   Therapeutic procedure. MEDICATIONS: MAC sedation, administered by CRNA and propofol (Diprivan) 140mg  IV TOPICAL ANESTHETIC: none  DESCRIPTION OF PROCEDURE: After the risks benefits and alternatives of the procedure were thoroughly explained, informed consent was obtained.  The LB GIF-H180 Loaner [id number] endoscope was introduced through the mouth and advanced to the second portion of the duodenum. Without limitations.  The instrument was slowly withdrawn as the mucosa was fully examined.  EXAM:The esophagus was normal except for a ringlike stricture at 30 cm.  The stomach revealed a sliding hernia but was otherwise normal.  The duodenum was normal.  Retroflexed views revealed a hiatal hernia.     The scope was then withdrawn from the patient and the procedure completed. THERAPY: TTS sequential balloon was passed through the endoscope and placed across the stricture. This was sequentially insufflated from 15, 16.5, and 18 mm. No significant resistance or heme. Tolerated well  COMPLICATIONS: There were no complications. ENDOSCOPIC IMPRESSION: 1. Distal esophageal stricture status post balloon dilation to 18 mm 2. Otherwise normal EGD 3. GERD 4. Chronic nausea with intermittent vomiting. Etiology yet to be determined.  RECOMMENDATIONS: 1.  Clear liquids until 6 PM, then soft foods rest of day.  Resume prior diet tomorrow. 2.  Continue current meds 3. Keep plans for your already scheduled gastric emptying scan. 4. Followup with Dr. Henrene Pastor in his office after gastric  emptying scan completed. Call for appointment  REPEAT EXAM:  eSigned:  Eustace Quail, MD 06/06/2013 4:25 PM   CC:W.  Lutricia Feil, MD and The Patient

## 2013-06-06 NOTE — Patient Instructions (Signed)
GERD - handout given. Esophageal stricture - handout given Dilation diet - handout given  YOU HAD AN ENDOSCOPIC PROCEDURE TODAY AT [facility]: Refer to the procedure report that was given to you for any specific questions about what was found during the examination.  If the procedure report does not answer your questions, please call your gastroenterologist to clarify.  If you requested that your care partner not be given the details of your procedure findings, then the procedure report has been included in a sealed envelope for you to review at your convenience later.  YOU SHOULD EXPECT: Some feelings of bloating in the abdomen. Passage of more gas than usual.  Walking can help get rid of the air that was put into your GI tract during the procedure and reduce the bloating. If you had a lower endoscopy (such as a colonoscopy or flexible sigmoidoscopy) you may notice spotting of blood in your stool or on the toilet paper. If you underwent a bowel prep for your procedure, then you may not have a normal bowel movement for a few days.  DIET: Your first meal following the procedure should be a light meal and then it is ok to progress to your normal diet.  A half-sandwich or bowl of soup is an example of a good first meal.  Heavy or fried foods are harder to digest and may make you feel nauseous or bloated.  Likewise meals heavy in dairy and vegetables can cause extra gas to form and this can also increase the bloating.  Drink plenty of fluids but you should avoid alcoholic beverages for 24 hours.  ACTIVITY: Your care partner should take you home directly after the procedure.  You should plan to take it easy, moving slowly for the rest of the day.  You can resume normal activity the day after the procedure however you should NOT DRIVE or use heavy machinery for 24 hours (because of the sedation medicines used during the test).    SYMPTOMS TO REPORT IMMEDIATELY: A gastroenterologist can be  reached at any hour.  During normal business hours, 8:30 AM to 5:00 PM Monday through Friday, call [phone].  After hours and on weekends, please call the GI answering service at [phone] who will take a message and have the physician on call contact you.   Following upper endoscopy (EGD)  Vomiting of blood or coffee ground material  New chest pain or pain under the shoulder blades  Painful or persistently difficult swallowing  New shortness of breath  Fever of 100F or higher  Black, tarry-looking stools  FOLLOW UP: If any biopsies were taken you will be contacted by phone or by letter within the next 1-3 weeks.  Call your gastroenterologist if you have not heard about the biopsies in 3 weeks.  Our staff will call the home number listed on your records the next business day following your procedure to check on you and address any questions or concerns that you may have at that time regarding the information given to you following your procedure. This is a courtesy call and so if there is no answer at the home number and we have not heard from you through the emergency physician on call, we will assume that you have returned to your regular daily activities without incident.  SIGNATURES/CONFIDENTIALITY: You and/or your care partner have signed paperwork which will be entered into your electronic medical record.  These signatures attest to the fact that that the information  above on your After Visit Summary has been reviewed and is understood.  Full responsibility of the confidentiality of this discharge information lies with you and/or your care-partner. 

## 2013-06-06 NOTE — Progress Notes (Signed)
A/ox3 pleased with MAC, report to Karol RN 

## 2013-06-07 ENCOUNTER — Telehealth: Payer: Self-pay | Admitting: *Deleted

## 2013-06-07 NOTE — Telephone Encounter (Signed)
Left message that we called for f/u 

## 2013-06-25 ENCOUNTER — Encounter (HOSPITAL_COMMUNITY)
Admission: RE | Admit: 2013-06-25 | Discharge: 2013-06-25 | Disposition: A | Discharge status: Home / Self Care | Payer: Medicare HMO | Source: Ambulatory Visit | Attending: Internal Medicine | Admitting: Internal Medicine

## 2013-06-25 DIAGNOSIS — R112 Nausea with vomiting, unspecified: Secondary | ICD-10-CM | POA: Insufficient documentation

## 2013-06-25 MED ORDER — TECHNETIUM TC 99M SULFUR COLLOID
2.0000 | Freq: Once | INTRAVENOUS | Status: AC | PRN
  Administered 2013-06-25: 2 via INTRAVENOUS

## 2013-07-09 ENCOUNTER — Ambulatory Visit: Payer: Medicare HMO | Admitting: Internal Medicine

## 2013-08-17 ENCOUNTER — Other Ambulatory Visit: Payer: Self-pay | Admitting: Internal Medicine

## 2013-08-17 DIAGNOSIS — Z1231 Encounter for screening mammogram for malignant neoplasm of breast: Secondary | ICD-10-CM

## 2013-08-31 ENCOUNTER — Ambulatory Visit: Payer: Medicare HMO

## 2014-04-10 ENCOUNTER — Ambulatory Visit (HOSPITAL_COMMUNITY): Admission: RE | Admit: 2014-04-10 | Payer: Medicare HMO | Source: Ambulatory Visit

## 2014-04-10 ENCOUNTER — Other Ambulatory Visit (HOSPITAL_COMMUNITY): Payer: Self-pay | Admitting: Internal Medicine

## 2014-04-22 ENCOUNTER — Encounter: Payer: Self-pay | Admitting: Internal Medicine

## 2014-04-22 ENCOUNTER — Encounter (HOSPITAL_COMMUNITY): Payer: Medicare HMO

## 2014-04-22 MED ORDER — SODIUM CHLORIDE 0.9 % IV SOLN: 250.0000 mL | Freq: Once | INTRAVENOUS | Status: DC

## 2014-04-22 MED ORDER — ZOLEDRONIC ACID 5 MG/100ML IV SOLN: 5.0000 mg | Freq: Once | INTRAVENOUS | Status: DC

## 2014-04-25 ENCOUNTER — Encounter (HOSPITAL_COMMUNITY): Payer: Self-pay

## 2014-04-25 ENCOUNTER — Ambulatory Visit (HOSPITAL_COMMUNITY)
Admission: RE | Admit: 2014-04-25 | Discharge: 2014-04-25 | Disposition: A | Discharge status: Home / Self Care | Payer: Medicare HMO | Source: Ambulatory Visit | Attending: Internal Medicine | Admitting: Internal Medicine

## 2014-04-25 ENCOUNTER — Other Ambulatory Visit (HOSPITAL_COMMUNITY): Payer: Self-pay | Admitting: Internal Medicine

## 2014-04-25 DIAGNOSIS — M810 Age-related osteoporosis without current pathological fracture: Secondary | ICD-10-CM | POA: Insufficient documentation

## 2014-04-25 MED ORDER — ZOLEDRONIC ACID 5 MG/100ML IV SOLN
5.0000 mg | Freq: Once | INTRAVENOUS | Status: AC
  Administered 2014-04-25: 5 mg via INTRAVENOUS
  Filled 2014-04-25: qty 100

## 2014-04-25 MED ORDER — SODIUM CHLORIDE 0.9 % IV SOLN
Freq: Once | INTRAVENOUS | Status: AC
  Administered 2014-04-25: 11:00:00 via INTRAVENOUS

## 2014-04-25 NOTE — Discharge Instructions (Signed)

## 2014-09-02 DIAGNOSIS — E1129 Type 2 diabetes mellitus with other diabetic kidney complication: Secondary | ICD-10-CM | POA: Diagnosis not present

## 2014-09-02 DIAGNOSIS — I10 Essential (primary) hypertension: Secondary | ICD-10-CM | POA: Diagnosis not present

## 2014-09-02 DIAGNOSIS — N183 Chronic kidney disease, stage 3 (moderate): Secondary | ICD-10-CM | POA: Diagnosis not present

## 2014-09-02 DIAGNOSIS — D509 Iron deficiency anemia, unspecified: Secondary | ICD-10-CM | POA: Diagnosis not present

## 2014-09-02 DIAGNOSIS — E039 Hypothyroidism, unspecified: Secondary | ICD-10-CM | POA: Diagnosis not present

## 2014-09-02 DIAGNOSIS — E538 Deficiency of other specified B group vitamins: Secondary | ICD-10-CM | POA: Diagnosis not present

## 2014-09-02 DIAGNOSIS — M8580 Other specified disorders of bone density and structure, unspecified site: Secondary | ICD-10-CM | POA: Diagnosis not present

## 2014-09-02 DIAGNOSIS — E785 Hyperlipidemia, unspecified: Secondary | ICD-10-CM | POA: Diagnosis not present

## 2014-11-04 DIAGNOSIS — G5602 Carpal tunnel syndrome, left upper limb: Secondary | ICD-10-CM | POA: Diagnosis not present

## 2014-11-04 DIAGNOSIS — M67432 Ganglion, left wrist: Secondary | ICD-10-CM | POA: Diagnosis not present

## 2014-11-04 DIAGNOSIS — M65331 Trigger finger, right middle finger: Secondary | ICD-10-CM | POA: Diagnosis not present

## 2014-11-04 DIAGNOSIS — G5601 Carpal tunnel syndrome, right upper limb: Secondary | ICD-10-CM | POA: Diagnosis not present

## 2015-01-23 DIAGNOSIS — N183 Chronic kidney disease, stage 3 (moderate): Secondary | ICD-10-CM | POA: Diagnosis not present

## 2015-01-23 DIAGNOSIS — E1129 Type 2 diabetes mellitus with other diabetic kidney complication: Secondary | ICD-10-CM | POA: Diagnosis not present

## 2015-01-23 DIAGNOSIS — D509 Iron deficiency anemia, unspecified: Secondary | ICD-10-CM | POA: Diagnosis not present

## 2015-01-23 DIAGNOSIS — I10 Essential (primary) hypertension: Secondary | ICD-10-CM | POA: Diagnosis not present

## 2015-01-23 DIAGNOSIS — E785 Hyperlipidemia, unspecified: Secondary | ICD-10-CM | POA: Diagnosis not present

## 2015-01-23 DIAGNOSIS — E039 Hypothyroidism, unspecified: Secondary | ICD-10-CM | POA: Diagnosis not present

## 2015-01-23 DIAGNOSIS — Z1389 Encounter for screening for other disorder: Secondary | ICD-10-CM | POA: Diagnosis not present

## 2015-01-23 DIAGNOSIS — M8580 Other specified disorders of bone density and structure, unspecified site: Secondary | ICD-10-CM | POA: Diagnosis not present

## 2015-05-05 DIAGNOSIS — E538 Deficiency of other specified B group vitamins: Secondary | ICD-10-CM | POA: Diagnosis not present

## 2015-05-06 DIAGNOSIS — E1129 Type 2 diabetes mellitus with other diabetic kidney complication: Secondary | ICD-10-CM | POA: Diagnosis not present

## 2015-05-06 DIAGNOSIS — E039 Hypothyroidism, unspecified: Secondary | ICD-10-CM | POA: Diagnosis not present

## 2015-05-06 DIAGNOSIS — M8580 Other specified disorders of bone density and structure, unspecified site: Secondary | ICD-10-CM | POA: Diagnosis not present

## 2015-05-06 DIAGNOSIS — Z0000 Encounter for general adult medical examination without abnormal findings: Secondary | ICD-10-CM | POA: Diagnosis not present

## 2015-05-12 DIAGNOSIS — I10 Essential (primary) hypertension: Secondary | ICD-10-CM | POA: Diagnosis not present

## 2015-05-12 DIAGNOSIS — E1129 Type 2 diabetes mellitus with other diabetic kidney complication: Secondary | ICD-10-CM | POA: Diagnosis not present

## 2015-05-12 DIAGNOSIS — E039 Hypothyroidism, unspecified: Secondary | ICD-10-CM | POA: Diagnosis not present

## 2015-05-12 DIAGNOSIS — Z0000 Encounter for general adult medical examination without abnormal findings: Secondary | ICD-10-CM | POA: Diagnosis not present

## 2015-05-12 DIAGNOSIS — R05 Cough: Secondary | ICD-10-CM | POA: Diagnosis not present

## 2015-05-12 DIAGNOSIS — M8580 Other specified disorders of bone density and structure, unspecified site: Secondary | ICD-10-CM | POA: Diagnosis not present

## 2015-05-12 DIAGNOSIS — Z1389 Encounter for screening for other disorder: Secondary | ICD-10-CM | POA: Diagnosis not present

## 2015-05-12 DIAGNOSIS — E785 Hyperlipidemia, unspecified: Secondary | ICD-10-CM | POA: Diagnosis not present

## 2015-10-09 DIAGNOSIS — K760 Fatty (change of) liver, not elsewhere classified: Secondary | ICD-10-CM | POA: Diagnosis not present

## 2015-10-09 DIAGNOSIS — R918 Other nonspecific abnormal finding of lung field: Secondary | ICD-10-CM | POA: Diagnosis not present

## 2015-10-09 DIAGNOSIS — Z9049 Acquired absence of other specified parts of digestive tract: Secondary | ICD-10-CM | POA: Diagnosis not present

## 2015-10-09 DIAGNOSIS — K449 Diaphragmatic hernia without obstruction or gangrene: Secondary | ICD-10-CM | POA: Diagnosis not present

## 2015-10-09 DIAGNOSIS — R911 Solitary pulmonary nodule: Secondary | ICD-10-CM | POA: Diagnosis not present

## 2015-10-09 DIAGNOSIS — R0602 Shortness of breath: Secondary | ICD-10-CM | POA: Diagnosis not present

## 2015-10-09 DIAGNOSIS — K210 Gastro-esophageal reflux disease with esophagitis: Secondary | ICD-10-CM | POA: Diagnosis not present

## 2015-10-09 DIAGNOSIS — R079 Chest pain, unspecified: Secondary | ICD-10-CM | POA: Diagnosis not present

## 2015-10-09 DIAGNOSIS — I10 Essential (primary) hypertension: Secondary | ICD-10-CM | POA: Diagnosis not present

## 2015-10-09 DIAGNOSIS — R069 Unspecified abnormalities of breathing: Secondary | ICD-10-CM | POA: Diagnosis not present

## 2015-10-09 DIAGNOSIS — E119 Type 2 diabetes mellitus without complications: Secondary | ICD-10-CM | POA: Diagnosis not present

## 2015-10-10 DIAGNOSIS — E1129 Type 2 diabetes mellitus with other diabetic kidney complication: Secondary | ICD-10-CM | POA: Diagnosis not present

## 2015-10-10 DIAGNOSIS — K219 Gastro-esophageal reflux disease without esophagitis: Secondary | ICD-10-CM | POA: Diagnosis not present

## 2015-10-10 DIAGNOSIS — Z6823 Body mass index (BMI) 23.0-23.9, adult: Secondary | ICD-10-CM | POA: Diagnosis not present

## 2015-10-10 DIAGNOSIS — I10 Essential (primary) hypertension: Secondary | ICD-10-CM | POA: Diagnosis not present

## 2015-10-10 DIAGNOSIS — K209 Esophagitis, unspecified: Secondary | ICD-10-CM | POA: Diagnosis not present

## 2015-12-09 ENCOUNTER — Ambulatory Visit: Payer: Medicare HMO | Admitting: Internal Medicine

## 2016-02-09 DIAGNOSIS — I10 Essential (primary) hypertension: Secondary | ICD-10-CM | POA: Diagnosis not present

## 2016-02-09 DIAGNOSIS — E1129 Type 2 diabetes mellitus with other diabetic kidney complication: Secondary | ICD-10-CM | POA: Diagnosis not present

## 2016-02-09 DIAGNOSIS — E784 Other hyperlipidemia: Secondary | ICD-10-CM | POA: Diagnosis not present

## 2016-02-09 DIAGNOSIS — Z6823 Body mass index (BMI) 23.0-23.9, adult: Secondary | ICD-10-CM | POA: Diagnosis not present

## 2016-02-09 DIAGNOSIS — R1013 Epigastric pain: Secondary | ICD-10-CM | POA: Diagnosis not present

## 2016-02-09 DIAGNOSIS — E038 Other specified hypothyroidism: Secondary | ICD-10-CM | POA: Diagnosis not present

## 2016-02-09 DIAGNOSIS — E538 Deficiency of other specified B group vitamins: Secondary | ICD-10-CM | POA: Diagnosis not present

## 2016-03-31 DIAGNOSIS — Z6823 Body mass index (BMI) 23.0-23.9, adult: Secondary | ICD-10-CM | POA: Diagnosis not present

## 2016-03-31 DIAGNOSIS — S60922A Unspecified superficial injury of left hand, initial encounter: Secondary | ICD-10-CM | POA: Diagnosis not present

## 2016-05-11 DIAGNOSIS — E538 Deficiency of other specified B group vitamins: Secondary | ICD-10-CM | POA: Diagnosis not present

## 2016-05-11 DIAGNOSIS — M859 Disorder of bone density and structure, unspecified: Secondary | ICD-10-CM | POA: Diagnosis not present

## 2016-05-11 DIAGNOSIS — E038 Other specified hypothyroidism: Secondary | ICD-10-CM | POA: Diagnosis not present

## 2016-05-11 DIAGNOSIS — I10 Essential (primary) hypertension: Secondary | ICD-10-CM | POA: Diagnosis not present

## 2016-05-11 DIAGNOSIS — E1129 Type 2 diabetes mellitus with other diabetic kidney complication: Secondary | ICD-10-CM | POA: Diagnosis not present

## 2016-05-18 DIAGNOSIS — K219 Gastro-esophageal reflux disease without esophagitis: Secondary | ICD-10-CM | POA: Diagnosis not present

## 2016-05-18 DIAGNOSIS — I10 Essential (primary) hypertension: Secondary | ICD-10-CM | POA: Diagnosis not present

## 2016-05-18 DIAGNOSIS — E538 Deficiency of other specified B group vitamins: Secondary | ICD-10-CM | POA: Diagnosis not present

## 2016-05-18 DIAGNOSIS — R1319 Other dysphagia: Secondary | ICD-10-CM | POA: Diagnosis not present

## 2016-05-18 DIAGNOSIS — E784 Other hyperlipidemia: Secondary | ICD-10-CM | POA: Diagnosis not present

## 2016-05-18 DIAGNOSIS — N183 Chronic kidney disease, stage 3 (moderate): Secondary | ICD-10-CM | POA: Diagnosis not present

## 2016-05-18 DIAGNOSIS — E038 Other specified hypothyroidism: Secondary | ICD-10-CM | POA: Diagnosis not present

## 2016-05-18 DIAGNOSIS — M8580 Other specified disorders of bone density and structure, unspecified site: Secondary | ICD-10-CM | POA: Diagnosis not present

## 2016-05-18 DIAGNOSIS — E1129 Type 2 diabetes mellitus with other diabetic kidney complication: Secondary | ICD-10-CM | POA: Diagnosis not present

## 2016-08-23 ENCOUNTER — Ambulatory Visit: Payer: Commercial Managed Care - HMO | Admitting: Internal Medicine

## 2016-10-12 DIAGNOSIS — M859 Disorder of bone density and structure, unspecified: Secondary | ICD-10-CM | POA: Diagnosis not present

## 2016-10-12 DIAGNOSIS — Z6824 Body mass index (BMI) 24.0-24.9, adult: Secondary | ICD-10-CM | POA: Diagnosis not present

## 2016-10-12 DIAGNOSIS — R309 Painful micturition, unspecified: Secondary | ICD-10-CM | POA: Diagnosis not present

## 2016-10-12 DIAGNOSIS — L729 Follicular cyst of the skin and subcutaneous tissue, unspecified: Secondary | ICD-10-CM | POA: Diagnosis not present

## 2016-10-12 DIAGNOSIS — E1129 Type 2 diabetes mellitus with other diabetic kidney complication: Secondary | ICD-10-CM | POA: Diagnosis not present

## 2016-10-12 DIAGNOSIS — I10 Essential (primary) hypertension: Secondary | ICD-10-CM | POA: Diagnosis not present

## 2016-10-12 DIAGNOSIS — E785 Hyperlipidemia, unspecified: Secondary | ICD-10-CM | POA: Diagnosis not present

## 2016-10-12 DIAGNOSIS — Z1389 Encounter for screening for other disorder: Secondary | ICD-10-CM | POA: Diagnosis not present

## 2016-10-14 ENCOUNTER — Other Ambulatory Visit: Payer: Self-pay | Admitting: Internal Medicine

## 2016-10-14 DIAGNOSIS — L729 Follicular cyst of the skin and subcutaneous tissue, unspecified: Secondary | ICD-10-CM

## 2016-10-22 ENCOUNTER — Ambulatory Visit
Admission: RE | Admit: 2016-10-22 | Discharge: 2016-10-22 | Disposition: A | Discharge status: Home / Self Care | Payer: Medicare HMO | Source: Ambulatory Visit | Attending: Internal Medicine | Admitting: Internal Medicine

## 2016-10-22 DIAGNOSIS — R1900 Intra-abdominal and pelvic swelling, mass and lump, unspecified site: Secondary | ICD-10-CM | POA: Diagnosis not present

## 2016-10-22 DIAGNOSIS — L729 Follicular cyst of the skin and subcutaneous tissue, unspecified: Secondary | ICD-10-CM

## 2016-10-29 ENCOUNTER — Other Ambulatory Visit: Payer: Self-pay | Admitting: Internal Medicine

## 2016-10-29 DIAGNOSIS — G9589 Other specified diseases of spinal cord: Secondary | ICD-10-CM

## 2016-11-10 ENCOUNTER — Other Ambulatory Visit: Payer: Medicare HMO

## 2016-11-12 DIAGNOSIS — M5127 Other intervertebral disc displacement, lumbosacral region: Secondary | ICD-10-CM | POA: Diagnosis not present

## 2016-11-12 DIAGNOSIS — M5136 Other intervertebral disc degeneration, lumbar region: Secondary | ICD-10-CM | POA: Diagnosis not present

## 2016-11-12 DIAGNOSIS — M5126 Other intervertebral disc displacement, lumbar region: Secondary | ICD-10-CM | POA: Diagnosis not present

## 2016-11-12 DIAGNOSIS — R222 Localized swelling, mass and lump, trunk: Secondary | ICD-10-CM | POA: Diagnosis not present

## 2016-11-12 DIAGNOSIS — M47896 Other spondylosis, lumbar region: Secondary | ICD-10-CM | POA: Diagnosis not present

## 2016-12-02 ENCOUNTER — Encounter: Payer: Self-pay | Admitting: Internal Medicine

## 2017-03-09 DIAGNOSIS — M79651 Pain in right thigh: Secondary | ICD-10-CM | POA: Diagnosis not present

## 2017-03-09 DIAGNOSIS — J209 Acute bronchitis, unspecified: Secondary | ICD-10-CM | POA: Diagnosis not present

## 2017-03-09 DIAGNOSIS — R2689 Other abnormalities of gait and mobility: Secondary | ICD-10-CM | POA: Diagnosis not present

## 2017-04-18 DIAGNOSIS — E784 Other hyperlipidemia: Secondary | ICD-10-CM | POA: Diagnosis not present

## 2017-04-18 DIAGNOSIS — E538 Deficiency of other specified B group vitamins: Secondary | ICD-10-CM | POA: Diagnosis not present

## 2017-04-18 DIAGNOSIS — Z23 Encounter for immunization: Secondary | ICD-10-CM | POA: Diagnosis not present

## 2017-04-18 DIAGNOSIS — I10 Essential (primary) hypertension: Secondary | ICD-10-CM | POA: Diagnosis not present

## 2017-04-18 DIAGNOSIS — E038 Other specified hypothyroidism: Secondary | ICD-10-CM | POA: Diagnosis not present

## 2017-04-18 DIAGNOSIS — N390 Urinary tract infection, site not specified: Secondary | ICD-10-CM | POA: Diagnosis not present

## 2017-04-18 DIAGNOSIS — Z6824 Body mass index (BMI) 24.0-24.9, adult: Secondary | ICD-10-CM | POA: Diagnosis not present

## 2017-04-18 DIAGNOSIS — E1129 Type 2 diabetes mellitus with other diabetic kidney complication: Secondary | ICD-10-CM | POA: Diagnosis not present

## 2017-04-18 DIAGNOSIS — R309 Painful micturition, unspecified: Secondary | ICD-10-CM | POA: Diagnosis not present

## 2017-06-28 DIAGNOSIS — E1129 Type 2 diabetes mellitus with other diabetic kidney complication: Secondary | ICD-10-CM | POA: Diagnosis not present

## 2017-06-28 DIAGNOSIS — E7849 Other hyperlipidemia: Secondary | ICD-10-CM | POA: Diagnosis not present

## 2017-06-28 DIAGNOSIS — M859 Disorder of bone density and structure, unspecified: Secondary | ICD-10-CM | POA: Diagnosis not present

## 2017-06-28 DIAGNOSIS — E038 Other specified hypothyroidism: Secondary | ICD-10-CM | POA: Diagnosis not present

## 2017-06-28 DIAGNOSIS — I10 Essential (primary) hypertension: Secondary | ICD-10-CM | POA: Diagnosis not present

## 2017-06-28 DIAGNOSIS — R82998 Other abnormal findings in urine: Secondary | ICD-10-CM | POA: Diagnosis not present

## 2017-06-28 DIAGNOSIS — E538 Deficiency of other specified B group vitamins: Secondary | ICD-10-CM | POA: Diagnosis not present

## 2017-07-05 DIAGNOSIS — I10 Essential (primary) hypertension: Secondary | ICD-10-CM | POA: Diagnosis not present

## 2017-07-05 DIAGNOSIS — E038 Other specified hypothyroidism: Secondary | ICD-10-CM | POA: Diagnosis not present

## 2017-07-05 DIAGNOSIS — M8580 Other specified disorders of bone density and structure, unspecified site: Secondary | ICD-10-CM | POA: Diagnosis not present

## 2017-07-05 DIAGNOSIS — E1129 Type 2 diabetes mellitus with other diabetic kidney complication: Secondary | ICD-10-CM | POA: Diagnosis not present

## 2017-07-05 DIAGNOSIS — Z0000 Encounter for general adult medical examination without abnormal findings: Secondary | ICD-10-CM | POA: Diagnosis not present

## 2017-07-05 DIAGNOSIS — E538 Deficiency of other specified B group vitamins: Secondary | ICD-10-CM | POA: Diagnosis not present

## 2017-07-05 DIAGNOSIS — N183 Chronic kidney disease, stage 3 (moderate): Secondary | ICD-10-CM | POA: Diagnosis not present

## 2017-07-05 DIAGNOSIS — K219 Gastro-esophageal reflux disease without esophagitis: Secondary | ICD-10-CM | POA: Diagnosis not present

## 2017-07-05 DIAGNOSIS — E7849 Other hyperlipidemia: Secondary | ICD-10-CM | POA: Diagnosis not present

## 2017-09-20 DIAGNOSIS — E7849 Other hyperlipidemia: Secondary | ICD-10-CM | POA: Diagnosis not present

## 2017-09-20 DIAGNOSIS — E538 Deficiency of other specified B group vitamins: Secondary | ICD-10-CM | POA: Diagnosis not present

## 2017-09-20 DIAGNOSIS — I10 Essential (primary) hypertension: Secondary | ICD-10-CM | POA: Diagnosis not present

## 2017-09-20 DIAGNOSIS — M7661 Achilles tendinitis, right leg: Secondary | ICD-10-CM | POA: Diagnosis not present

## 2017-09-20 DIAGNOSIS — Z6823 Body mass index (BMI) 23.0-23.9, adult: Secondary | ICD-10-CM | POA: Diagnosis not present

## 2017-09-20 DIAGNOSIS — M791 Myalgia, unspecified site: Secondary | ICD-10-CM | POA: Diagnosis not present

## 2017-09-20 DIAGNOSIS — R29898 Other symptoms and signs involving the musculoskeletal system: Secondary | ICD-10-CM | POA: Diagnosis not present

## 2017-09-21 ENCOUNTER — Emergency Department (HOSPITAL_COMMUNITY): Payer: Medicare HMO

## 2017-09-21 ENCOUNTER — Other Ambulatory Visit: Payer: Self-pay

## 2017-09-21 ENCOUNTER — Encounter (HOSPITAL_COMMUNITY): Payer: Self-pay | Admitting: Emergency Medicine

## 2017-09-21 DIAGNOSIS — M62838 Other muscle spasm: Secondary | ICD-10-CM | POA: Diagnosis not present

## 2017-09-21 DIAGNOSIS — E1122 Type 2 diabetes mellitus with diabetic chronic kidney disease: Secondary | ICD-10-CM | POA: Insufficient documentation

## 2017-09-21 DIAGNOSIS — N289 Disorder of kidney and ureter, unspecified: Secondary | ICD-10-CM | POA: Diagnosis not present

## 2017-09-21 DIAGNOSIS — Z7984 Long term (current) use of oral hypoglycemic drugs: Secondary | ICD-10-CM | POA: Insufficient documentation

## 2017-09-21 DIAGNOSIS — Z79899 Other long term (current) drug therapy: Secondary | ICD-10-CM | POA: Diagnosis not present

## 2017-09-21 DIAGNOSIS — D649 Anemia, unspecified: Secondary | ICD-10-CM | POA: Insufficient documentation

## 2017-09-21 DIAGNOSIS — I129 Hypertensive chronic kidney disease with stage 1 through stage 4 chronic kidney disease, or unspecified chronic kidney disease: Secondary | ICD-10-CM | POA: Diagnosis not present

## 2017-09-21 DIAGNOSIS — N183 Chronic kidney disease, stage 3 (moderate): Secondary | ICD-10-CM | POA: Diagnosis not present

## 2017-09-21 DIAGNOSIS — E871 Hypo-osmolality and hyponatremia: Secondary | ICD-10-CM | POA: Insufficient documentation

## 2017-09-21 DIAGNOSIS — E039 Hypothyroidism, unspecified: Secondary | ICD-10-CM | POA: Diagnosis not present

## 2017-09-21 DIAGNOSIS — M542 Cervicalgia: Secondary | ICD-10-CM | POA: Diagnosis present

## 2017-09-21 DIAGNOSIS — R0602 Shortness of breath: Secondary | ICD-10-CM | POA: Insufficient documentation

## 2017-09-21 LAB — BASIC METABOLIC PANEL
Anion gap: 10 (ref 5–15)
BUN: 20 mg/dL (ref 6–20)
CALCIUM: 9.2 mg/dL (ref 8.9–10.3)
CO2: 21 mmol/L — ABNORMAL LOW (ref 22–32)
Chloride: 92 mmol/L — ABNORMAL LOW (ref 101–111)
Creatinine, Ser: 1.36 mg/dL — ABNORMAL HIGH (ref 0.44–1.00)
GFR calc Af Amer: 45 mL/min — ABNORMAL LOW (ref >60)
GFR calc non Af Amer: 39 mL/min — ABNORMAL LOW (ref >60)
Glucose, Bld: 157 mg/dL — ABNORMAL HIGH (ref 65–99)
Potassium: 5.1 mmol/L (ref 3.5–5.1)
Sodium: 123 mmol/L — ABNORMAL LOW (ref 135–145)

## 2017-09-21 LAB — CBC
HCT: 32.6 % — ABNORMAL LOW (ref 36.0–46.0)
HEMOGLOBIN: 11.3 g/dL — AB (ref 12.0–15.0)
MCH: 31 pg (ref 26.0–34.0)
MCHC: 34.7 g/dL (ref 30.0–36.0)
MCV: 89.6 fL (ref 78.0–100.0)
Platelets: 345 10*3/uL (ref 150–400)
RBC: 3.64 MIL/uL — AB (ref 3.87–5.11)
RDW: 12.8 % (ref 11.5–15.5)
WBC: 8.3 10*3/uL (ref 4.0–10.5)

## 2017-09-21 LAB — I-STAT TROPONIN, ED: Troponin i, poc: 0 ng/mL (ref 0.00–0.08)

## 2017-09-21 NOTE — ED Triage Notes (Signed)
Patient here from home with neck pain, shortness of breath and unable to keep her head up.  She states she was seen in her PCP office and they said she had low sodium.  She denies any chest pain but she feels like she is having a tightness in her throat.

## 2017-09-22 ENCOUNTER — Emergency Department (HOSPITAL_COMMUNITY)
Admission: EM | Admit: 2017-09-22 | Discharge: 2017-09-22 | Disposition: A | Discharge status: Home / Self Care | Payer: Medicare HMO | Attending: Emergency Medicine | Admitting: Emergency Medicine

## 2017-09-22 DIAGNOSIS — N289 Disorder of kidney and ureter, unspecified: Secondary | ICD-10-CM

## 2017-09-22 DIAGNOSIS — D649 Anemia, unspecified: Secondary | ICD-10-CM

## 2017-09-22 DIAGNOSIS — E871 Hypo-osmolality and hyponatremia: Secondary | ICD-10-CM

## 2017-09-22 DIAGNOSIS — M62838 Other muscle spasm: Secondary | ICD-10-CM | POA: Diagnosis not present

## 2017-09-22 LAB — CK: Total CK: 68 U/L (ref 38–234)

## 2017-09-22 LAB — SEDIMENTATION RATE: Sed Rate: 4 mm/hr (ref 0–22)

## 2017-09-22 LAB — BRAIN NATRIURETIC PEPTIDE: B NATRIURETIC PEPTIDE 5: 15.6 pg/mL (ref 0.0–100.0)

## 2017-09-22 MED ORDER — SODIUM CHLORIDE 0.9 % IV BOLUS (SEPSIS)
1000.0000 mL | Freq: Once | INTRAVENOUS | Status: AC
  Administered 2017-09-22: 1000 mL via INTRAVENOUS

## 2017-09-22 MED ORDER — METOCLOPRAMIDE HCL 5 MG/ML IJ SOLN
10.0000 mg | Freq: Once | INTRAMUSCULAR | Status: AC
  Administered 2017-09-22: 10 mg via INTRAVENOUS
  Filled 2017-09-22: qty 2

## 2017-09-22 MED ORDER — DIPHENHYDRAMINE HCL 50 MG/ML IJ SOLN
25.0000 mg | Freq: Once | INTRAMUSCULAR | Status: AC
  Administered 2017-09-22: 25 mg via INTRAVENOUS
  Filled 2017-09-22: qty 1

## 2017-09-22 NOTE — Discharge Instructions (Signed)
Return if your symptoms are getting worse. 

## 2017-09-22 NOTE — ED Provider Notes (Signed)
[facility] Provider Note   CSN: [id number] Arrival date & time: 09/21/17  2235     History   Chief Complaint Chief Complaint  Patient presents with  . Shortness of Breath  . Neck Pain    HPI Jessica [name] is a 67 y.o. female.  The history is provided by the patient.  Shortness of Breath  Associated symptoms include neck pain.  Neck Pain    She has a history of diabetes, hypertension, hyperlipidemia, chronic kidney disease and comes in with a one-week history of spasms in her legs with associated episodes of feeling like her legs were dragging.  In between episodes, her legs seem to be working normally.  Leg spasms now seem to be going up to involve her neck.  For the last 3 days, she has had a right retroauricular headache which is very unusual for her.  His headache is worse when she has her neck spasms and pain will get a severe as 9/10.  She is visually impaired, so has not noticed any change in her vision.  She has not noticed any weakness of her arms.  She has had some episodes where she feels some spasms in her neck and feels like she is not able to breathe well.  She denies chest pain, heaviness, tightness, pressure.  There is been no difficulty swallowing.  She saw her PCP who told her that her sodium and potassium are low, stopped her diuretic, and also stopped her cholesterol medicine because it might be affecting her muscles.  Past Medical History:  Diagnosis Date  . Anxiety   . CKD (chronic kidney disease)    CKD III per PCP notes-pt denies  . Congenital blindness   . Diabetes mellitus   . Diverticula of small intestine   . Esophageal stricture   . GERD (gastroesophageal reflux disease)   . H/O hiatal hernia   . Hyperlipidemia   . Hypertension   . Hypothyroidism   . Iron deficiency anemia   . Osteoporosis   . Postoperative nausea and vomiting   . Retinitis   . SVT (supraventricular tachycardia) [facility])     Patient  Active Problem List   Diagnosis Date Noted  . Nausea with vomiting 05/21/2013  . HYPOTHYROIDISM 11/15/2007  . DIABETES MELLITUS, TYPE II 11/15/2007  . DYSLIPIDEMIA 11/15/2007  . VISUAL IMPAIRMENT 11/15/2007  . ESOPHAGEAL STRICTURE 02/08/2007  . GASTROESOPHAGEAL REFLUX DISEASE 02/08/2007  . HIATAL HERNIA 02/08/2007    Past Surgical History:  Procedure Laterality Date  . BACK SURGERY    . CARPAL TUNNEL RELEASE     Right   . CESAREAN SECTION    . CHOLECYSTECTOMY  1985  . COLONOSCOPY    . HERNIA REPAIR    . Right knee arthroscopy  1993  . Right thyroidectomy  2007  . TOTAL ABDOMINAL HYSTERECTOMY W/ BILATERAL SALPINGOOPHORECTOMY  1993  . UPPER GASTROINTESTINAL ENDOSCOPY      OB History    No data available       Home Medications    Prior to Admission medications   Medication Sig Start Date End Date Taking? Authorizing Provider  atorvastatin (LIPITOR) 20 MG tablet Take 20 mg by mouth daily.    [provider]  Calcium Carbonate-Vitamin D (CALTRATE 600+D PO) Take by mouth 2 (two) times daily. One tablet by mouth twice daily    [provider]  cholecalciferol (VITAMIN D) 1000 UNITS tablet Take 1,000 Units by mouth 2 (two) times daily.  [provider]  cyanocobalamin (,VITAMIN B-12,) 1000 MCG/ML injection Inject 1,000 mcg into the muscle every 30 (thirty) days.  06/05/13   [provider]  diclofenac sodium (VOLTAREN) 1 % GEL Apply 4 g topically every 6 (six) hours as needed.    [provider]  levothyroxine (SYNTHROID, LEVOTHROID) 50 MCG tablet Take 50 mcg by mouth daily.    [provider]  LORazepam (ATIVAN) 0.5 MG tablet Take 0.5 mg by mouth daily.    [provider]  metFORMIN (GLUCOPHAGE) 1000 MG tablet Take 1,000 mg by mouth 2 (two) times daily with a meal.    [provider]  metoprolol succinate (TOPROL-XL) 25 MG 24 hr tablet Take 25 mg by mouth daily.    [provider]  Omeprazole  ([employer] OMEPRAZOLE) 20 MG TBEC Take 20 mg by mouth 2 (two) times daily.    [provider]  ondansetron (ZOFRAN) 4 MG tablet Take 4 mg by mouth 2 (two) times daily.  05/22/13   [provider]  promethazine (PHENERGAN) 25 MG tablet Take 25 mg by mouth every 8 (eight) hours as needed for nausea.    [provider]  ranitidine (ZANTAC) 300 MG tablet Take 300 mg by mouth daily.    [provider]  traMADol (ULTRAM) 50 MG tablet Take 50 mg by mouth every 8 (eight) hours as needed for pain.    [provider]  valsartan-hydrochlorothiazide (DIOVAN-HCT) 320-25 MG per tablet Take 1 tablet by mouth daily.    [provider]  Vitamin D, Ergocalciferol, (DRISDOL) 50000 UNITS CAPS capsule Take 50,000 Units by mouth every 7 (seven) days.  04/07/13   [provider]  zoledronic acid (RECLAST) 5 MG/100ML SOLN injection Inject 5 mg into the vein once. Once per year    [provider]    Family History Family History  Problem Relation Age of Onset  . Coronary artery disease Father   . COPD Father   . CVA Mother   . Diabetes Mellitus II Mother   . Diabetes Mellitus II Maternal Grandmother   . Uterine cancer Sister   . Hypertension Sister        Brother also  . Hyperlipidemia Sister        Brother also  . Colon cancer Neg Hx   . Rectal cancer Neg Hx   . Stomach cancer Neg Hx   . Esophageal cancer Neg Hx     Social History Social History   Tobacco Use  . Smoking status: Never Smoker  . Smokeless tobacco: Never Used  Substance Use Topics  . Alcohol use: No  . Drug use: No     Allergies   Levaquin [levofloxacin]; Codeine; and Erythromycin   Review of Systems Review of Systems  Respiratory: Positive for shortness of breath.   Musculoskeletal: Positive for neck pain.  All other systems reviewed and are negative.    Physical Exam Updated Vital Signs BP (!) 144/78   Pulse 71   Temp 97.9 F (36.6 C) (Oral)   Resp 17    SpO2 99%   Physical Exam  Nursing note and vitals reviewed.  67 year old female, resting comfortably and in no acute distress. Vital signs are significant for mildly elevated systolic blood pressure. Oxygen saturation is 99%, which is normal.   Head is normocephalic and atraumatic.  Pupils are 4 mm and minimally reactive.  Eyes are disconjugate and she does a poor job of cooperating with EOM exam.. Oropharynx is  clear.  There is tenderness over both temporal arteries. Neck is supple without adenopathy or JVD.  There is tenderness over the paracervical muscles bilaterally. Back is nontender and there is no CVA tenderness. Lungs are clear without rales, wheezes, or rhonchi. Chest is nontender. Heart has regular rate and rhythm without murmur. Abdomen is soft, flat, nontender without masses or hepatosplenomegaly and peristalsis is normoactive. Extremities have no cyanosis or edema, full range of motion is present. Skin is warm and dry without rash. Neurologic: Mental status is normal, cranial nerves are intact, there are no motor or sensory deficits.  ED Treatments / Results  Labs (all labs ordered are listed, but only abnormal results are displayed) Labs Reviewed  BASIC METABOLIC PANEL - Abnormal; Notable for the following components:      Result Value   Sodium 123 (*)    Chloride 92 (*)    CO2 21 (*)    Glucose, Bld 157 (*)    Creatinine, Ser 1.36 (*)    GFR calc non Af Amer 39 (*)    GFR calc Af Amer 45 (*)    All other components within normal limits  CBC - Abnormal; Notable for the following components:   RBC 3.64 (*)    Hemoglobin 11.3 (*)    HCT 32.6 (*)    All other components within normal limits  BRAIN NATRIURETIC PEPTIDE  SEDIMENTATION RATE  CK  I-STAT TROPONIN, ED    EKG  EKG Interpretation  Date/Time:  Wednesday September 21 2017 22:41:59 EST Ventricular Rate:  81 PR Interval:  180 QRS Duration: 84 QT Interval:  372 QTC Calculation: 432 R Axis:   18 Text  Interpretation:  Normal sinus rhythm Cannot rule out Anterior infarct , age undetermined Abnormal ECG When compared with ECG of 12/30/2006, No significant change was found Confirmed by Delora Fuel ([id number]) on 09/21/2017 11:04:52 PM       Radiology Dg Chest 2 View  Result Date: 09/21/2017 CLINICAL DATA:  Initial evaluation for acute shortness of breath, neck pain. EXAM: CHEST  2 VIEW COMPARISON:  Prior radiograph from 12/29/2006. FINDINGS: The cardiac and mediastinal silhouettes are stable in size and contour, and remain within normal limits. Aortic atherosclerosis. The lungs are normally inflated. No airspace consolidation, pleural effusion, or pulmonary edema is identified. There is no pneumothorax. No acute osseous abnormality identified. Remote compression fracture with sequelae of prior vertebral augmentation noted within the upper lumbar spine. IMPRESSION: No active cardiopulmonary disease. Electronically Signed   By: Jeannine Boga M.D.   On: 09/21/2017 23:07    Procedures Procedures   Medications Ordered in ED Medications  sodium chloride 0.9 % bolus 1,000 mL (0 mLs Intravenous Stopped 09/22/17 0609)  metoCLOPramide (REGLAN) injection 10 mg (10 mg Intravenous Given 09/22/17 0455)  diphenhydrAMINE (BENADRYL) injection 25 mg (25 mg Intravenous Given 09/22/17 0455)     Initial Impression / Assessment and Plan / ED Course  I have reviewed the triage vital signs and the nursing notes.  Pertinent labs & imaging results that were available during my care of the patient were reviewed by me and considered in my medical decision making (see chart for details).  Neck and leg spasms which could conceivably related to her hyponatremia.  Sodium is indeed low today-123.  Potassium is normal today.  There is mild renal insufficiency and mild anemia which patient states have been present previously.  Also, I wonder if some of her symptoms might be polymyalgia rheumatica.  Will check sedimentation  rate  and also check CK.  She will be given IV fluids and metoclopramide to see if this helps her headache and neck spasms.  Old records are reviewed, and she has no relevant past visits.  Mentation rate and CK are both normal.  Patient had considerable improvement, but not complete improvement, with above-noted treatment.  It is possible that her spasms are entirely related to hyponatremia.  She has a follow-up appointment scheduled with her primary care physician tomorrow.  She is discharged with instructions to keep that appointment, return precautions discussed.  Final Clinical Impressions(s) / ED Diagnoses   Final diagnoses:  Hyponatremia  Neck muscle spasm  Muscle spasms of both lower extremities  Renal insufficiency  Normochromic normocytic anemia    ED Discharge Orders    None       Delora Fuel, MD 37/50/51 [phone]

## 2017-09-23 DIAGNOSIS — E871 Hypo-osmolality and hyponatremia: Secondary | ICD-10-CM | POA: Diagnosis not present

## 2017-09-28 ENCOUNTER — Encounter (HOSPITAL_COMMUNITY): Payer: Self-pay

## 2017-09-28 DIAGNOSIS — R5383 Other fatigue: Secondary | ICD-10-CM | POA: Diagnosis not present

## 2017-09-28 DIAGNOSIS — E039 Hypothyroidism, unspecified: Secondary | ICD-10-CM | POA: Insufficient documentation

## 2017-09-28 DIAGNOSIS — N3000 Acute cystitis without hematuria: Secondary | ICD-10-CM | POA: Insufficient documentation

## 2017-09-28 DIAGNOSIS — N183 Chronic kidney disease, stage 3 (moderate): Secondary | ICD-10-CM | POA: Diagnosis not present

## 2017-09-28 DIAGNOSIS — I129 Hypertensive chronic kidney disease with stage 1 through stage 4 chronic kidney disease, or unspecified chronic kidney disease: Secondary | ICD-10-CM | POA: Insufficient documentation

## 2017-09-28 DIAGNOSIS — M6281 Muscle weakness (generalized): Secondary | ICD-10-CM | POA: Diagnosis not present

## 2017-09-28 DIAGNOSIS — E1122 Type 2 diabetes mellitus with diabetic chronic kidney disease: Secondary | ICD-10-CM | POA: Insufficient documentation

## 2017-09-28 DIAGNOSIS — Z79899 Other long term (current) drug therapy: Secondary | ICD-10-CM | POA: Insufficient documentation

## 2017-09-28 DIAGNOSIS — Z7984 Long term (current) use of oral hypoglycemic drugs: Secondary | ICD-10-CM | POA: Diagnosis not present

## 2017-09-28 DIAGNOSIS — R531 Weakness: Secondary | ICD-10-CM | POA: Diagnosis not present

## 2017-09-28 DIAGNOSIS — R55 Syncope and collapse: Secondary | ICD-10-CM | POA: Diagnosis not present

## 2017-09-28 LAB — URINALYSIS, ROUTINE W REFLEX MICROSCOPIC
BILIRUBIN URINE: NEGATIVE
Glucose, UA: NEGATIVE mg/dL
KETONES UR: NEGATIVE mg/dL
NITRITE: NEGATIVE
PROTEIN: NEGATIVE mg/dL
Specific Gravity, Urine: 1.003 — ABNORMAL LOW (ref 1.005–1.030)
pH: 6 (ref 5.0–8.0)

## 2017-09-28 LAB — BASIC METABOLIC PANEL
ANION GAP: 11 (ref 5–15)
BUN: 17 mg/dL (ref 6–20)
CHLORIDE: 101 mmol/L (ref 101–111)
CO2: 21 mmol/L — ABNORMAL LOW (ref 22–32)
Calcium: 9.4 mg/dL (ref 8.9–10.3)
Creatinine, Ser: 1.33 mg/dL — ABNORMAL HIGH (ref 0.44–1.00)
GFR calc Af Amer: 46 mL/min — ABNORMAL LOW (ref >60)
GFR, EST NON AFRICAN AMERICAN: 40 mL/min — AB (ref >60)
Glucose, Bld: 159 mg/dL — ABNORMAL HIGH (ref 65–99)
POTASSIUM: 4.8 mmol/L (ref 3.5–5.1)
SODIUM: 133 mmol/L — AB (ref 135–145)

## 2017-09-28 LAB — CBG MONITORING, ED: GLUCOSE-CAPILLARY: 139 mg/dL — AB (ref 65–99)

## 2017-09-28 LAB — CBC
HEMATOCRIT: 32.6 % — AB (ref 36.0–46.0)
HEMOGLOBIN: 11 g/dL — AB (ref 12.0–15.0)
MCH: 31.9 pg (ref 26.0–34.0)
MCHC: 33.7 g/dL (ref 30.0–36.0)
MCV: 94.5 fL (ref 78.0–100.0)
Platelets: 327 10*3/uL (ref 150–400)
RBC: 3.45 MIL/uL — ABNORMAL LOW (ref 3.87–5.11)
RDW: 13.2 % (ref 11.5–15.5)
WBC: 7 10*3/uL (ref 4.0–10.5)

## 2017-09-28 NOTE — ED Triage Notes (Signed)
Pt c/o of headache, neck pain, back pain, and generalized weakness since 7pm. Neuro intact bilaterally

## 2017-09-29 ENCOUNTER — Emergency Department (HOSPITAL_COMMUNITY)
Admission: EM | Admit: 2017-09-29 | Discharge: 2017-09-29 | Disposition: A | Discharge status: Home / Self Care | Payer: Medicare HMO | Attending: Emergency Medicine | Admitting: Emergency Medicine

## 2017-09-29 DIAGNOSIS — R55 Syncope and collapse: Secondary | ICD-10-CM

## 2017-09-29 DIAGNOSIS — N3000 Acute cystitis without hematuria: Secondary | ICD-10-CM

## 2017-09-29 MED ORDER — CEPHALEXIN 500 MG PO CAPS: 500.0000 mg | ORAL_CAPSULE | Freq: Three times a day (TID) | ORAL | 0 refills | Status: DC

## 2017-09-29 MED ORDER — SODIUM CHLORIDE 0.9 % IV SOLN
1.0000 g | Freq: Once | INTRAVENOUS | Status: AC
  Administered 2017-09-29: 1 g via INTRAVENOUS
  Filled 2017-09-29: qty 10

## 2017-09-29 MED ORDER — SODIUM CHLORIDE 0.9 % IV BOLUS (SEPSIS)
500.0000 mL | Freq: Once | INTRAVENOUS | Status: AC
  Administered 2017-09-29: 500 mL via INTRAVENOUS

## 2017-09-29 NOTE — ED Notes (Signed)
Pt ambulated well with assistance. Pt moves all extremities well. Pt denied dizziness when walking. Maintain oxygen sats when walking.

## 2017-09-29 NOTE — ED Notes (Signed)
GCS 15. Skin warm and dry. Respirations equal and unlabored. Abdomen soft and non distended. Pt complaint of weakness. Pt able to move all extremities well. Pt required some assistance with mobility.

## 2017-09-29 NOTE — ED Provider Notes (Signed)
[facility] EMERGENCY DEPARTMENT Provider Note   CSN: [id number] Arrival date & time: 09/28/17  2031     History   Chief Complaint Chief Complaint  Patient presents with  . Weakness    HPI Jessica [name] is a 67 y.o. female.  The history is provided by the patient and a friend.  Weakness  This is a new problem. Episode onset: Several hours ago. The problem has not changed since onset.There has been no fever. Pertinent negatives include no shortness of breath, no chest pain and no vomiting.  Patient presents with generalized weakness.  She presents with her husband and her pastor.  While she was standing at church she began to feel lightheaded and had generalized weakness.  She did not have syncope, but did have to be helped to sit down.  Since that time she reports generalized weakness.  She reports mild suprapubic pain and dysuria.  No fevers or vomiting.  She also reports neck pain and some headache, but she has had this previously.  She was recently in  the emergency department for dehydration/ hyponatremia, but has had medications adjusted since that time  Past Medical History:  Diagnosis Date  . Anxiety   . CKD (chronic kidney disease)    CKD III per PCP notes-pt denies  . Congenital blindness   . Diabetes mellitus   . Diverticula of small intestine   . Esophageal stricture   . GERD (gastroesophageal reflux disease)   . H/O hiatal hernia   . Hyperlipidemia   . Hypertension   . Hypothyroidism   . Iron deficiency anemia   . Osteoporosis   . Postoperative nausea and vomiting   . Retinitis   . SVT (supraventricular tachycardia) [facility])     Patient Active Problem List   Diagnosis Date Noted  . Nausea with vomiting 05/21/2013  . HYPOTHYROIDISM 11/15/2007  . DIABETES MELLITUS, TYPE II 11/15/2007  . DYSLIPIDEMIA 11/15/2007  . VISUAL IMPAIRMENT 11/15/2007  . ESOPHAGEAL STRICTURE 02/08/2007  . GASTROESOPHAGEAL REFLUX DISEASE 02/08/2007  . HIATAL HERNIA  02/08/2007    Past Surgical History:  Procedure Laterality Date  . BACK SURGERY    . CARPAL TUNNEL RELEASE     Right   . CESAREAN SECTION    . CHOLECYSTECTOMY  1985  . COLONOSCOPY    . HERNIA REPAIR    . Right knee arthroscopy  1993  . Right thyroidectomy  2007  . TOTAL ABDOMINAL HYSTERECTOMY W/ BILATERAL SALPINGOOPHORECTOMY  1993  . UPPER GASTROINTESTINAL ENDOSCOPY      OB History    No data available       Home Medications    Prior to Admission medications   Medication Sig Start Date End Date Taking? Authorizing Provider  amLODipine (NORVASC) 5 MG tablet Take 5 mg by mouth daily.    [provider]  carvedilol (COREG) 25 MG tablet Take 25 mg by mouth 2 (two) times daily with a meal.    [provider]  cholecalciferol (VITAMIN D) 1000 units tablet Take 2,000 Units by mouth daily.    [provider]  levothyroxine (SYNTHROID, LEVOTHROID) 50 MCG tablet Take 75 mcg by mouth daily.     [provider]  LORazepam (ATIVAN) 0.5 MG tablet Take 0.5 mg by mouth daily.    [provider]  losartan (COZAAR) 50 MG tablet Take 100 mg by mouth daily.    [provider]  Magnesium 250 MG TABS Take 1 tablet by mouth daily.  [provider]  metFORMIN (GLUCOPHAGE) 1000 MG tablet Take 1,000 mg by mouth 2 (two) times daily with a meal.    [provider]  Omeprazole ([facility] OMEPRAZOLE) 20 MG TBEC Take 20 mg by mouth daily.     [provider]  promethazine (PHENERGAN) 25 MG tablet Take 25 mg by mouth every 8 (eight) hours as needed for nausea.    [provider]  ranitidine (ZANTAC) 300 MG tablet Take 300 mg by mouth daily.    [provider]  traMADol (ULTRAM) 50 MG tablet Take 50 mg by mouth every 8 (eight) hours as needed for pain.    [provider]    Family History Family History  Problem Relation Age of Onset  . Coronary artery disease Father   . COPD Father   . CVA Mother     . Diabetes Mellitus II Mother   . Diabetes Mellitus II Maternal Grandmother   . Uterine cancer Sister   . Hypertension Sister        Brother also  . Hyperlipidemia Sister        Brother also  . Colon cancer Neg Hx   . Rectal cancer Neg Hx   . Stomach cancer Neg Hx   . Esophageal cancer Neg Hx     Social History Social History   Tobacco Use  . Smoking status: Never Smoker  . Smokeless tobacco: Never Used  Substance Use Topics  . Alcohol use: No  . Drug use: No     Allergies   Codeine; Erythromycin; and Levaquin [levofloxacin]   Review of Systems Review of Systems  Constitutional: Positive for fatigue. Negative for fever.  Respiratory: Negative for shortness of breath.   Cardiovascular: Negative for chest pain.  Gastrointestinal: Negative for vomiting.  Genitourinary: Positive for dysuria.  Neurological: Positive for weakness. Negative for seizures and syncope.  All other systems reviewed and are negative.    Physical Exam Updated Vital Signs BP (!) 144/86   Pulse 94   Temp 97.7 F (36.5 C) (Oral)   Resp 18   Ht 1.549 m (5\' 1" )   Wt 56.7 kg (125 lb)   SpO2 100%   BMI 23.62 kg/m   Physical Exam CONSTITUTIONAL: Chronically ill-appearing, appears older than stated age HEAD: Normocephalic/atraumatic EYES: Blind ENMT: Mucous membranes moist NECK: supple no meningeal signs CV: S1/S2 noted, no murmurs/rubs/gallops noted LUNGS: Lungs are clear to auscultation bilaterally, no apparent distress ABDOMEN: soft, mild suprapubic tenderness, no rebound or guarding, bowel sounds noted throughout abdomen GU:no cva tenderness NEURO: Pt is awake/alert/appropriate, moves all extremitiesx4.  No facial droop.  No arm or leg drift EXTREMITIES: pulses normal/equal, full ROM SKIN: warm, color normal PSYCH: no abnormalities of mood noted, alert and oriented to situation   ED Treatments / Results  Labs (all labs ordered are listed, but only abnormal results are  displayed) Labs Reviewed  BASIC METABOLIC PANEL - Abnormal; Notable for the following components:      Result Value   Sodium 133 (*)    CO2 21 (*)    Glucose, Bld 159 (*)    Creatinine, Ser 1.33 (*)    GFR calc non Af Amer 40 (*)    GFR calc Af Amer 46 (*)    All other components within normal limits  CBC - Abnormal; Notable for the following components:   RBC 3.45 (*)    Hemoglobin 11.0 (*)    HCT 32.6 (*)    All other components within  normal limits  URINALYSIS, ROUTINE W REFLEX MICROSCOPIC - Abnormal; Notable for the following components:   Color, Urine STRAW (*)    APPearance HAZY (*)    Specific Gravity, Urine 1.003 (*)    Hgb urine dipstick SMALL (*)    Leukocytes, UA MODERATE (*)    Bacteria, UA FEW (*)    Squamous Epithelial / LPF 6-30 (*)    All other components within normal limits  CBG MONITORING, ED - Abnormal; Notable for the following components:   Glucose-Capillary 139 (*)    All other components within normal limits  URINE CULTURE    EKG  EKG Interpretation  Date/Time:  Wednesday September 28 2017 20:45:12 EST Ventricular Rate:  86 PR Interval:  170 QRS Duration: 80 QT Interval:  344 QTC Calculation: 411 R Axis:   17 Text Interpretation:  Normal sinus rhythm Low voltage QRS Cannot rule out Anterior infarct , age undetermined Abnormal ECG No significant change since last tracing Confirmed by Ripley Fraise [phone]) on 09/29/2017 4:49:50 AM       Radiology No results found.  Procedures Procedures   Medications Ordered in ED Medications  cefTRIAXone (ROCEPHIN) 1 g in sodium chloride 0.9 % 100 mL IVPB (0 g Intravenous Stopped 09/29/17 0625)  sodium chloride 0.9 % bolus 500 mL (0 mLs Intravenous Stopped 09/29/17 0625)     Initial Impression / Assessment and Plan / ED Course  I have reviewed the triage vital signs and the nursing notes.  Pertinent labs  results that were available during my care of the patient were reviewed by me and considered in my  medical decision making (see chart for details).     5:39 AM Patient with near syncopal episode.  She appears to have UTI. Plan to rehydrate and give antibiotics and reassess 8:04 AM Overall patient improved.  She was able to ambulate without difficulty.  Denies any new pain.  No focal weakness. She feels comfortable for discharge. Will place on Keflex, 3 times daily dosing due to renal dysfunction She already has follow-up with PCP in 1 week  Suspicion for cardiac or neurologic emergency as cause is low  Extensive discussion of return precautions discussed with patient and family Final Clinical Impressions(s) / ED Diagnoses   Final diagnoses:  Acute cystitis without hematuria  Near syncope    ED Discharge Orders        Ordered    cephALEXin (KEFLEX) 500 MG capsule  3 times daily     09/29/17 0731       Ripley Fraise, MD 09/29/17 [phone]

## 2017-09-29 NOTE — Discharge Instructions (Signed)

## 2017-10-01 LAB — URINE CULTURE: Culture: >=100000 — AB

## 2017-10-02 ENCOUNTER — Telehealth: Payer: Self-pay | Admitting: Emergency Medicine

## 2017-10-02 NOTE — Telephone Encounter (Signed)
Post ED Visit - Positive Culture Follow-up  Culture report reviewed by antimicrobial stewardship pharmacist:  []  Elenor Quinones, Pharm.D. []  Heide Guile, Pharm.D., BCPS AQ-ID []  Parks Neptune, Pharm.D., BCPS []  Alycia Rossetti, Pharm.D., BCPS []  [place], Pharm.D., BCPS, AAHIVP []  Legrand Como, Pharm.D., BCPS, AAHIVP []  Salome Arnt, PharmD, BCPS []  Jalene Mullet, PharmD []  Vincenza Hews, PharmD, BCPS Jimmy Footman PharmD  Positive urine culture Treated with cephalexin, organism sensitive to the same and no further patient follow-up is required at this time.  Hazle Nordmann 10/02/2017, 1:54 PM

## 2017-10-07 DIAGNOSIS — I10 Essential (primary) hypertension: Secondary | ICD-10-CM | POA: Diagnosis not present

## 2017-10-20 ENCOUNTER — Encounter: Payer: Self-pay | Admitting: Internal Medicine

## 2017-10-20 DIAGNOSIS — M7661 Achilles tendinitis, right leg: Secondary | ICD-10-CM | POA: Diagnosis not present

## 2017-10-20 DIAGNOSIS — I10 Essential (primary) hypertension: Secondary | ICD-10-CM | POA: Diagnosis not present

## 2017-10-20 DIAGNOSIS — Z6824 Body mass index (BMI) 24.0-24.9, adult: Secondary | ICD-10-CM | POA: Diagnosis not present

## 2017-10-20 DIAGNOSIS — R1310 Dysphagia, unspecified: Secondary | ICD-10-CM | POA: Diagnosis not present

## 2017-10-20 DIAGNOSIS — E871 Hypo-osmolality and hyponatremia: Secondary | ICD-10-CM | POA: Diagnosis not present

## 2017-11-02 DIAGNOSIS — I2510 Atherosclerotic heart disease of native coronary artery without angina pectoris: Secondary | ICD-10-CM | POA: Diagnosis not present

## 2017-11-02 DIAGNOSIS — Z7984 Long term (current) use of oral hypoglycemic drugs: Secondary | ICD-10-CM | POA: Diagnosis not present

## 2017-11-02 DIAGNOSIS — R000 Tachycardia, unspecified: Secondary | ICD-10-CM | POA: Diagnosis not present

## 2017-11-02 DIAGNOSIS — R079 Chest pain, unspecified: Secondary | ICD-10-CM | POA: Diagnosis not present

## 2017-11-02 DIAGNOSIS — R0602 Shortness of breath: Secondary | ICD-10-CM | POA: Diagnosis not present

## 2017-11-02 DIAGNOSIS — M7989 Other specified soft tissue disorders: Secondary | ICD-10-CM | POA: Diagnosis not present

## 2017-11-02 DIAGNOSIS — I10 Essential (primary) hypertension: Secondary | ICD-10-CM | POA: Diagnosis not present

## 2017-11-02 DIAGNOSIS — E119 Type 2 diabetes mellitus without complications: Secondary | ICD-10-CM | POA: Diagnosis not present

## 2017-11-02 DIAGNOSIS — R072 Precordial pain: Secondary | ICD-10-CM | POA: Diagnosis not present

## 2017-11-02 DIAGNOSIS — R51 Headache: Secondary | ICD-10-CM | POA: Diagnosis not present

## 2017-11-03 DIAGNOSIS — R0602 Shortness of breath: Secondary | ICD-10-CM | POA: Diagnosis not present

## 2017-11-03 DIAGNOSIS — M7989 Other specified soft tissue disorders: Secondary | ICD-10-CM | POA: Diagnosis not present

## 2017-11-03 DIAGNOSIS — R51 Headache: Secondary | ICD-10-CM | POA: Diagnosis not present

## 2017-11-03 DIAGNOSIS — R079 Chest pain, unspecified: Secondary | ICD-10-CM | POA: Diagnosis not present

## 2017-11-08 DIAGNOSIS — E871 Hypo-osmolality and hyponatremia: Secondary | ICD-10-CM | POA: Diagnosis not present

## 2017-11-08 DIAGNOSIS — E038 Other specified hypothyroidism: Secondary | ICD-10-CM | POA: Diagnosis not present

## 2017-11-08 DIAGNOSIS — R1310 Dysphagia, unspecified: Secondary | ICD-10-CM | POA: Diagnosis not present

## 2017-11-08 DIAGNOSIS — Z6824 Body mass index (BMI) 24.0-24.9, adult: Secondary | ICD-10-CM | POA: Diagnosis not present

## 2017-11-08 DIAGNOSIS — M6281 Muscle weakness (generalized): Secondary | ICD-10-CM | POA: Diagnosis not present

## 2017-11-08 DIAGNOSIS — R6883 Chills (without fever): Secondary | ICD-10-CM | POA: Diagnosis not present

## 2017-11-08 DIAGNOSIS — I10 Essential (primary) hypertension: Secondary | ICD-10-CM | POA: Diagnosis not present

## 2017-11-08 DIAGNOSIS — E1129 Type 2 diabetes mellitus with other diabetic kidney complication: Secondary | ICD-10-CM | POA: Diagnosis not present

## 2017-11-10 DIAGNOSIS — R000 Tachycardia, unspecified: Secondary | ICD-10-CM | POA: Diagnosis not present

## 2017-11-10 DIAGNOSIS — E119 Type 2 diabetes mellitus without complications: Secondary | ICD-10-CM | POA: Diagnosis not present

## 2017-11-10 DIAGNOSIS — I2510 Atherosclerotic heart disease of native coronary artery without angina pectoris: Secondary | ICD-10-CM | POA: Diagnosis not present

## 2017-11-10 DIAGNOSIS — I10 Essential (primary) hypertension: Secondary | ICD-10-CM | POA: Diagnosis not present

## 2017-11-10 DIAGNOSIS — R0602 Shortness of breath: Secondary | ICD-10-CM | POA: Diagnosis not present

## 2017-11-10 DIAGNOSIS — E871 Hypo-osmolality and hyponatremia: Secondary | ICD-10-CM | POA: Diagnosis not present

## 2017-11-10 DIAGNOSIS — Z888 Allergy status to other drugs, medicaments and biological substances status: Secondary | ICD-10-CM | POA: Diagnosis not present

## 2017-11-10 DIAGNOSIS — R251 Tremor, unspecified: Secondary | ICD-10-CM | POA: Diagnosis not present

## 2017-11-10 DIAGNOSIS — R350 Frequency of micturition: Secondary | ICD-10-CM | POA: Diagnosis not present

## 2017-11-10 DIAGNOSIS — E079 Disorder of thyroid, unspecified: Secondary | ICD-10-CM | POA: Diagnosis not present

## 2017-11-10 DIAGNOSIS — E785 Hyperlipidemia, unspecified: Secondary | ICD-10-CM | POA: Diagnosis not present

## 2017-11-23 DIAGNOSIS — M7661 Achilles tendinitis, right leg: Secondary | ICD-10-CM | POA: Diagnosis not present

## 2017-11-25 ENCOUNTER — Ambulatory Visit: Payer: Medicare HMO | Admitting: Nurse Practitioner

## 2017-11-29 DIAGNOSIS — Z7984 Long term (current) use of oral hypoglycemic drugs: Secondary | ICD-10-CM | POA: Diagnosis not present

## 2017-11-29 DIAGNOSIS — E119 Type 2 diabetes mellitus without complications: Secondary | ICD-10-CM | POA: Diagnosis not present

## 2017-11-29 DIAGNOSIS — K219 Gastro-esophageal reflux disease without esophagitis: Secondary | ICD-10-CM | POA: Diagnosis not present

## 2017-11-29 DIAGNOSIS — I10 Essential (primary) hypertension: Secondary | ICD-10-CM | POA: Diagnosis not present

## 2017-11-29 DIAGNOSIS — H548 Legal blindness, as defined in USA: Secondary | ICD-10-CM | POA: Diagnosis not present

## 2017-11-29 DIAGNOSIS — M7661 Achilles tendinitis, right leg: Secondary | ICD-10-CM | POA: Diagnosis not present

## 2017-12-01 DIAGNOSIS — I10 Essential (primary) hypertension: Secondary | ICD-10-CM | POA: Diagnosis not present

## 2017-12-01 DIAGNOSIS — K219 Gastro-esophageal reflux disease without esophagitis: Secondary | ICD-10-CM | POA: Diagnosis not present

## 2017-12-01 DIAGNOSIS — E119 Type 2 diabetes mellitus without complications: Secondary | ICD-10-CM | POA: Diagnosis not present

## 2017-12-01 DIAGNOSIS — H548 Legal blindness, as defined in USA: Secondary | ICD-10-CM | POA: Diagnosis not present

## 2017-12-01 DIAGNOSIS — M7661 Achilles tendinitis, right leg: Secondary | ICD-10-CM | POA: Diagnosis not present

## 2017-12-01 DIAGNOSIS — Z7984 Long term (current) use of oral hypoglycemic drugs: Secondary | ICD-10-CM | POA: Diagnosis not present

## 2017-12-02 ENCOUNTER — Ambulatory Visit: Payer: Medicare HMO | Admitting: Internal Medicine

## 2017-12-06 ENCOUNTER — Encounter

## 2017-12-06 ENCOUNTER — Encounter: Payer: Self-pay | Admitting: Gastroenterology

## 2017-12-06 ENCOUNTER — Ambulatory Visit: Payer: Medicare HMO | Admitting: Gastroenterology

## 2017-12-06 VITALS — BP 130/70 | HR 72 | Ht 61.5 in | Wt 128.0 lb

## 2017-12-06 DIAGNOSIS — R1310 Dysphagia, unspecified: Secondary | ICD-10-CM

## 2017-12-06 DIAGNOSIS — Z8719 Personal history of other diseases of the digestive system: Secondary | ICD-10-CM | POA: Diagnosis not present

## 2017-12-06 NOTE — Patient Instructions (Signed)

## 2017-12-06 NOTE — Progress Notes (Signed)
Assessment and plan reviewed 

## 2017-12-06 NOTE — Progress Notes (Signed)
12/06/2017 Jessica [name] [id number] 16-Dec-1950   HISTORY OF PRESENT ILLNESS:  This is a 67 year old female who is known Dr. Henrene Pastor.  Has history of GERD and esophageal stricture, which was dilated in 2008 and 2014.  She has been on omeprazole 20 mg daily most recently along with zantac 300 mg at bedtime.  She was on a higher dose of omeprazole in the past until she read about renal issues and she already has CKD so she decreased the dose.  Overall doing ok in regards to her reflux but still has some symptoms.  Dysphagia returned again about one year ago but much worse over the past 6 months.  Now having dysphagia to almost all foods and liquids as well if she drinks too much or too quickly.   Past Medical History:  Diagnosis Date  . Anxiety   . CKD (chronic kidney disease)    CKD III per PCP notes-pt denies  . Congenital blindness   . Diabetes mellitus   . Diverticula of small intestine   . Esophageal stricture   . GERD (gastroesophageal reflux disease)   . H/O hiatal hernia   . Hyperlipidemia   . Hypertension   . Hypothyroidism   . Iron deficiency anemia   . Low magnesium level   . Low sodium levels   . Osteoporosis   . Postoperative nausea and vomiting   . Retinitis   . SVT (supraventricular tachycardia) ([facility])    Past Surgical History:  Procedure Laterality Date  . BACK SURGERY    . CARPAL TUNNEL RELEASE     Right   . CESAREAN SECTION    . CHOLECYSTECTOMY  1985  . COLONOSCOPY    . HERNIA REPAIR    . Right knee arthroscopy  1993  . Right thyroidectomy  2007  . TOTAL ABDOMINAL HYSTERECTOMY W/ BILATERAL SALPINGOOPHORECTOMY  1993  . UPPER GASTROINTESTINAL ENDOSCOPY      reports that she has never smoked. She has never used smokeless tobacco. She reports that she does not drink alcohol or use drugs. family history includes COPD in her father; CVA in her mother; Coronary artery disease in her father; Diabetes Mellitus II in her maternal grandmother and mother;  Hyperlipidemia in her sister; Hypertension in her sister and sister; Uterine cancer in her sister. Allergies  Allergen Reactions  . Codeine Shortness Of Breath and Nausea And Vomiting  . Erythromycin Nausea And Vomiting  . Levaquin [Levofloxacin] Shortness Of Breath      Outpatient Encounter Medications as of 12/06/2017  Medication Sig  . amLODipine (NORVASC) 5 MG tablet Take 5 mg by mouth daily.  Marland Kitchen aspirin 81 MG tablet Take 81 mg by mouth daily.  . carvedilol (COREG) 25 MG tablet Take 25 mg by mouth 2 (two) times daily with a meal.  . cholecalciferol (VITAMIN D) 1000 units tablet Take 2,000 Units by mouth daily.  . hydrALAZINE (APRESOLINE) 25 MG tablet Take 1 tablet by mouth 3 (three) times daily.  Marland Kitchen levothyroxine (SYNTHROID, LEVOTHROID) 50 MCG tablet Take 75 mcg by mouth daily.   Marland Kitchen LORazepam (ATIVAN) 0.5 MG tablet Take 0.5 mg by mouth daily.  Marland Kitchen losartan (COZAAR) 100 MG tablet Take 100 mg by mouth daily.  . Magnesium 250 MG TABS Take 1 tablet by mouth daily.  . metFORMIN (GLUCOPHAGE) 1000 MG tablet Take 1,000 mg by mouth 2 (two) times daily with a meal.  . Omeprazole ([employer]S OMEPRAZOLE) 20 MG TBEC Take 20 mg by mouth daily.   Marland Kitchen  promethazine (PHENERGAN) 25 MG tablet Take 25 mg by mouth every 8 (eight) hours as needed for nausea.  . ranitidine (ZANTAC) 300 MG tablet Take 300 mg by mouth daily.  . rosuvastatin (CRESTOR) 20 MG tablet Take 1 tablet by mouth daily.  . [DISCONTINUED] cephALEXin (KEFLEX) 500 MG capsule Take 1 capsule (500 mg total) by mouth 3 (three) times daily.  . [DISCONTINUED] traMADol (ULTRAM) 50 MG tablet Take 50 mg by mouth every 8 (eight) hours as needed for pain.   No facility-administered encounter medications on file as of 12/06/2017.      REVIEW OF SYSTEMS  : All other systems reviewed and negative except where noted in the History of Present Illness.   PHYSICAL EXAM: BP 130/70   Pulse 72   Ht 5' 1.5" (1.562 m)   Wt 128 lb (58.1 kg)   BMI 23.79 kg/m    General: Well developed white female in no acute distress Head: Normocephalic and atraumatic Eyes:  Sclerae anicteric, conjunctiva pink. Ears: Normal auditory acuity Lungs: Clear throughout to auscultation; no increased WOB. Heart: Regular rate and rhythm; no M/R/G. Abdomen: Soft, non-distended.  BS present.  Mild epigastric TTP. Musculoskeletal: Symmetrical with no gross deformities  Skin: No lesions on visible extremities Extremities: No edema  Neurological: Alert oriented x 4, grossly non-focal Psychological:  Alert and cooperative. Normal mood and affect  ASSESSMENT AND PLAN: *Dysphagia with history of esophageal stricture:  Symptoms are the same as in the past.  Likely has recurrent stricture.  Will schedule for EGD with dilation with Dr. Henrene Pastor.  **Patient reports she had colonoscopy and WCE in 2010 in [place] for evaluation of anemia.  Will try to obtain those records.  **The risks, benefits, and alternatives to EGD with dilation were discussed with the patient and she consents to proceed.    CC:  Marton Redwood, MD

## 2017-12-07 DIAGNOSIS — E119 Type 2 diabetes mellitus without complications: Secondary | ICD-10-CM | POA: Diagnosis not present

## 2017-12-07 DIAGNOSIS — H548 Legal blindness, as defined in USA: Secondary | ICD-10-CM | POA: Diagnosis not present

## 2017-12-07 DIAGNOSIS — I10 Essential (primary) hypertension: Secondary | ICD-10-CM | POA: Diagnosis not present

## 2017-12-07 DIAGNOSIS — Z7984 Long term (current) use of oral hypoglycemic drugs: Secondary | ICD-10-CM | POA: Diagnosis not present

## 2017-12-07 DIAGNOSIS — M7661 Achilles tendinitis, right leg: Secondary | ICD-10-CM | POA: Diagnosis not present

## 2017-12-07 DIAGNOSIS — K219 Gastro-esophageal reflux disease without esophagitis: Secondary | ICD-10-CM | POA: Diagnosis not present

## 2017-12-08 ENCOUNTER — Encounter: Payer: Self-pay | Admitting: Internal Medicine

## 2017-12-09 DIAGNOSIS — Z7984 Long term (current) use of oral hypoglycemic drugs: Secondary | ICD-10-CM | POA: Diagnosis not present

## 2017-12-09 DIAGNOSIS — K219 Gastro-esophageal reflux disease without esophagitis: Secondary | ICD-10-CM | POA: Diagnosis not present

## 2017-12-09 DIAGNOSIS — I10 Essential (primary) hypertension: Secondary | ICD-10-CM | POA: Diagnosis not present

## 2017-12-09 DIAGNOSIS — E119 Type 2 diabetes mellitus without complications: Secondary | ICD-10-CM | POA: Diagnosis not present

## 2017-12-09 DIAGNOSIS — H548 Legal blindness, as defined in USA: Secondary | ICD-10-CM | POA: Diagnosis not present

## 2017-12-09 DIAGNOSIS — M7661 Achilles tendinitis, right leg: Secondary | ICD-10-CM | POA: Diagnosis not present

## 2017-12-13 DIAGNOSIS — Z7984 Long term (current) use of oral hypoglycemic drugs: Secondary | ICD-10-CM | POA: Diagnosis not present

## 2017-12-13 DIAGNOSIS — E119 Type 2 diabetes mellitus without complications: Secondary | ICD-10-CM | POA: Diagnosis not present

## 2017-12-13 DIAGNOSIS — I10 Essential (primary) hypertension: Secondary | ICD-10-CM | POA: Diagnosis not present

## 2017-12-13 DIAGNOSIS — H548 Legal blindness, as defined in USA: Secondary | ICD-10-CM | POA: Diagnosis not present

## 2017-12-13 DIAGNOSIS — M7661 Achilles tendinitis, right leg: Secondary | ICD-10-CM | POA: Diagnosis not present

## 2017-12-13 DIAGNOSIS — K219 Gastro-esophageal reflux disease without esophagitis: Secondary | ICD-10-CM | POA: Diagnosis not present

## 2017-12-14 ENCOUNTER — Ambulatory Visit (AMBULATORY_SURGERY_CENTER): Payer: Medicare HMO | Admitting: Internal Medicine

## 2017-12-14 ENCOUNTER — Other Ambulatory Visit: Payer: Self-pay

## 2017-12-14 ENCOUNTER — Encounter: Payer: Self-pay | Admitting: Internal Medicine

## 2017-12-14 VITALS — BP 124/80 | HR 89 | Temp 98.6°F | Resp 17 | Ht 61.0 in | Wt 128.0 lb

## 2017-12-14 DIAGNOSIS — R1310 Dysphagia, unspecified: Secondary | ICD-10-CM | POA: Diagnosis present

## 2017-12-14 DIAGNOSIS — K222 Esophageal obstruction: Secondary | ICD-10-CM

## 2017-12-14 MED ORDER — SODIUM CHLORIDE 0.9 % IV SOLN
500.0000 mL | Freq: Once | INTRAVENOUS | Status: DC
Start: 2017-12-14 — End: 2019-02-07

## 2017-12-14 NOTE — Progress Notes (Signed)
Called to room to assist during endoscopic procedure.  Patient ID and intended procedure confirmed with present staff. Received instructions for my participation in the procedure from the performing physician.  

## 2017-12-14 NOTE — Op Note (Signed)
[facility] Patient Name: Jessica [name] Procedure Date: 12/14/2017 1:56 PM MRN: [id number] Endoscopist: Docia Chuck. Henrene Pastor , MD Age: 67 Referring MD:  Date of Birth: 08-05-1950 Gender: Female Account #: [id number] Procedure:                Upper GI endoscopy, with balloon dilation of the                            esophagus 16-18 mm Indications:              Dysphagia Medicines:                Monitored Anesthesia Care Procedure:                Pre-Anesthesia Assessment:                           - Prior to the procedure, a History and Physical                            was performed, and patient medications and                            allergies were reviewed. The patient's tolerance of                            previous anesthesia was also reviewed. The risks                            and benefits of the procedure and the sedation                            options and risks were discussed with the patient.                            All questions were answered, and informed consent                            was obtained. Prior Anticoagulants: The patient has                            taken no previous anticoagulant or antiplatelet                            agents. ASA Grade Assessment: II - A patient with                            mild systemic disease. After reviewing the risks                            and benefits, the patient was deemed in                            satisfactory condition to undergo the procedure.  After obtaining informed consent, the endoscope was                            passed under direct vision. Throughout the                            procedure, the patient's blood pressure, pulse, and                            oxygen saturations were monitored continuously. The                            Model GIF-HQ190 [id number] [id number]) scope was introduced                            through the mouth, and advanced to the second  part                            of duodenum. The upper GI endoscopy was                            accomplished without difficulty. The patient                            tolerated the procedure well. Scope In: Scope Out: Findings:                 One benign-appearing, intrinsic moderate stenosis                            was found 30 cm from the incisors. This stenosis                            measured 1.5 cm (inner diameter). A TTS dilator was                            passed through the scope. Dilation with a                            15-16.5-18 mm balloon dilator was performed to                            58mm. Dilation was performed sequentially. No heme.                            Tolerated well .                           The exam of the esophagus was otherwise normal.                           The stomach revealed a moderate sliding hiatal                            hernia but  was otherwise normal.                           The examined duodenum was normal.                           The cardia and gastric fundus were normal on                            retroflexion. Complications:            No immediate complications. Estimated Blood Loss:     Estimated blood loss: none. Impression:               - Benign-appearing esophageal stenosis. Dilated.                           - Normal stomach with hiatal hernia.                           - Normal examined duodenum.                           - No specimens collected. Recommendation:           - Patient has a contact number available for                            emergencies. The signs and symptoms of potential                            delayed complications were discussed with the                            patient. Return to normal activities tomorrow.                            Written discharge instructions were provided to the                            patient.                           - Post dilation diet.                            - Continue present medications.                           - Follow-up as needed for recurrent swallowing                            issues Stefan Markarian N. Henrene Pastor, MD 12/14/2017 2:26:35 PM This report has been signed electronically.

## 2017-12-14 NOTE — Progress Notes (Signed)
I have reviewed the patient's medical history in detail and updated the computerized patient record.

## 2017-12-14 NOTE — Progress Notes (Signed)
A/ox3, pleased with MAC, report to RN 

## 2017-12-15 ENCOUNTER — Telehealth: Payer: Self-pay

## 2017-12-15 ENCOUNTER — Telehealth: Payer: Self-pay | Admitting: *Deleted

## 2017-12-15 DIAGNOSIS — I10 Essential (primary) hypertension: Secondary | ICD-10-CM | POA: Diagnosis not present

## 2017-12-15 DIAGNOSIS — H548 Legal blindness, as defined in USA: Secondary | ICD-10-CM | POA: Diagnosis not present

## 2017-12-15 DIAGNOSIS — K219 Gastro-esophageal reflux disease without esophagitis: Secondary | ICD-10-CM | POA: Diagnosis not present

## 2017-12-15 DIAGNOSIS — E119 Type 2 diabetes mellitus without complications: Secondary | ICD-10-CM | POA: Diagnosis not present

## 2017-12-15 DIAGNOSIS — Z7984 Long term (current) use of oral hypoglycemic drugs: Secondary | ICD-10-CM | POA: Diagnosis not present

## 2017-12-15 DIAGNOSIS — M7661 Achilles tendinitis, right leg: Secondary | ICD-10-CM | POA: Diagnosis not present

## 2017-12-15 NOTE — Telephone Encounter (Signed)
Left message

## 2017-12-15 NOTE — Telephone Encounter (Signed)
  Follow up Call-  Call back number 12/14/2017  Post procedure Call Back phone  # [phone]  Permission to leave phone message Yes  Some recent data might be hidden     Patient questions:  Do you have a fever, pain , or abdominal swelling? No. Pain Score  0 *  Have you tolerated food without any problems? Yes.    Have you been able to return to your normal activities? Yes.    Do you have any questions about your discharge instructions: Diet   No. Medications  No. Follow up visit  No.  Do you have questions or concerns about your Care? No.  Actions: * If pain score is 4 or above: No action needed, pain <4.

## 2017-12-21 DIAGNOSIS — E119 Type 2 diabetes mellitus without complications: Secondary | ICD-10-CM | POA: Diagnosis not present

## 2017-12-21 DIAGNOSIS — M7661 Achilles tendinitis, right leg: Secondary | ICD-10-CM | POA: Diagnosis not present

## 2017-12-21 DIAGNOSIS — I10 Essential (primary) hypertension: Secondary | ICD-10-CM | POA: Diagnosis not present

## 2017-12-21 DIAGNOSIS — Z7984 Long term (current) use of oral hypoglycemic drugs: Secondary | ICD-10-CM | POA: Diagnosis not present

## 2017-12-21 DIAGNOSIS — H548 Legal blindness, as defined in USA: Secondary | ICD-10-CM | POA: Diagnosis not present

## 2017-12-21 DIAGNOSIS — K219 Gastro-esophageal reflux disease without esophagitis: Secondary | ICD-10-CM | POA: Diagnosis not present

## 2017-12-23 DIAGNOSIS — E119 Type 2 diabetes mellitus without complications: Secondary | ICD-10-CM | POA: Diagnosis not present

## 2017-12-23 DIAGNOSIS — M7661 Achilles tendinitis, right leg: Secondary | ICD-10-CM | POA: Diagnosis not present

## 2017-12-23 DIAGNOSIS — Z7984 Long term (current) use of oral hypoglycemic drugs: Secondary | ICD-10-CM | POA: Diagnosis not present

## 2017-12-23 DIAGNOSIS — H548 Legal blindness, as defined in USA: Secondary | ICD-10-CM | POA: Diagnosis not present

## 2017-12-23 DIAGNOSIS — K219 Gastro-esophageal reflux disease without esophagitis: Secondary | ICD-10-CM | POA: Diagnosis not present

## 2017-12-23 DIAGNOSIS — I10 Essential (primary) hypertension: Secondary | ICD-10-CM | POA: Diagnosis not present

## 2018-01-03 DIAGNOSIS — H548 Legal blindness, as defined in USA: Secondary | ICD-10-CM | POA: Diagnosis not present

## 2018-01-03 DIAGNOSIS — M7661 Achilles tendinitis, right leg: Secondary | ICD-10-CM | POA: Diagnosis not present

## 2018-01-03 DIAGNOSIS — Z7984 Long term (current) use of oral hypoglycemic drugs: Secondary | ICD-10-CM | POA: Diagnosis not present

## 2018-01-03 DIAGNOSIS — I10 Essential (primary) hypertension: Secondary | ICD-10-CM | POA: Diagnosis not present

## 2018-01-03 DIAGNOSIS — K219 Gastro-esophageal reflux disease without esophagitis: Secondary | ICD-10-CM | POA: Diagnosis not present

## 2018-01-03 DIAGNOSIS — E119 Type 2 diabetes mellitus without complications: Secondary | ICD-10-CM | POA: Diagnosis not present

## 2018-02-24 DIAGNOSIS — E871 Hypo-osmolality and hyponatremia: Secondary | ICD-10-CM | POA: Diagnosis not present

## 2018-02-24 DIAGNOSIS — E7849 Other hyperlipidemia: Secondary | ICD-10-CM | POA: Diagnosis not present

## 2018-02-24 DIAGNOSIS — E038 Other specified hypothyroidism: Secondary | ICD-10-CM | POA: Diagnosis not present

## 2018-02-24 DIAGNOSIS — I10 Essential (primary) hypertension: Secondary | ICD-10-CM | POA: Diagnosis not present

## 2018-02-24 DIAGNOSIS — N183 Chronic kidney disease, stage 3 (moderate): Secondary | ICD-10-CM | POA: Diagnosis not present

## 2018-02-24 DIAGNOSIS — R1310 Dysphagia, unspecified: Secondary | ICD-10-CM | POA: Diagnosis not present

## 2018-02-24 DIAGNOSIS — E1129 Type 2 diabetes mellitus with other diabetic kidney complication: Secondary | ICD-10-CM | POA: Diagnosis not present

## 2018-02-24 DIAGNOSIS — D508 Other iron deficiency anemias: Secondary | ICD-10-CM | POA: Diagnosis not present

## 2018-06-25 DIAGNOSIS — R42 Dizziness and giddiness: Secondary | ICD-10-CM | POA: Diagnosis not present

## 2018-06-25 DIAGNOSIS — R079 Chest pain, unspecified: Secondary | ICD-10-CM | POA: Diagnosis not present

## 2018-06-25 DIAGNOSIS — E785 Hyperlipidemia, unspecified: Secondary | ICD-10-CM | POA: Diagnosis not present

## 2018-06-25 DIAGNOSIS — Z79899 Other long term (current) drug therapy: Secondary | ICD-10-CM | POA: Diagnosis not present

## 2018-06-25 DIAGNOSIS — I10 Essential (primary) hypertension: Secondary | ICD-10-CM | POA: Diagnosis not present

## 2018-06-25 DIAGNOSIS — R0602 Shortness of breath: Secondary | ICD-10-CM | POA: Diagnosis not present

## 2018-06-25 DIAGNOSIS — E119 Type 2 diabetes mellitus without complications: Secondary | ICD-10-CM | POA: Diagnosis not present

## 2018-06-25 DIAGNOSIS — Z885 Allergy status to narcotic agent status: Secondary | ICD-10-CM | POA: Diagnosis not present

## 2018-06-25 DIAGNOSIS — R112 Nausea with vomiting, unspecified: Secondary | ICD-10-CM | POA: Diagnosis not present

## 2018-06-25 DIAGNOSIS — E079 Disorder of thyroid, unspecified: Secondary | ICD-10-CM | POA: Diagnosis not present

## 2018-06-25 DIAGNOSIS — G319 Degenerative disease of nervous system, unspecified: Secondary | ICD-10-CM | POA: Diagnosis not present

## 2018-06-25 DIAGNOSIS — J45909 Unspecified asthma, uncomplicated: Secondary | ICD-10-CM | POA: Diagnosis not present

## 2018-07-17 DIAGNOSIS — E038 Other specified hypothyroidism: Secondary | ICD-10-CM | POA: Diagnosis not present

## 2018-07-17 DIAGNOSIS — E538 Deficiency of other specified B group vitamins: Secondary | ICD-10-CM | POA: Diagnosis not present

## 2018-07-17 DIAGNOSIS — N183 Chronic kidney disease, stage 3 (moderate): Secondary | ICD-10-CM | POA: Diagnosis not present

## 2018-07-17 DIAGNOSIS — E1129 Type 2 diabetes mellitus with other diabetic kidney complication: Secondary | ICD-10-CM | POA: Diagnosis not present

## 2018-07-17 DIAGNOSIS — E7849 Other hyperlipidemia: Secondary | ICD-10-CM | POA: Diagnosis not present

## 2018-07-17 DIAGNOSIS — M859 Disorder of bone density and structure, unspecified: Secondary | ICD-10-CM | POA: Diagnosis not present

## 2018-07-17 DIAGNOSIS — R82998 Other abnormal findings in urine: Secondary | ICD-10-CM | POA: Diagnosis not present

## 2018-07-25 ENCOUNTER — Other Ambulatory Visit: Payer: Self-pay | Admitting: Internal Medicine

## 2018-07-25 DIAGNOSIS — R1013 Epigastric pain: Secondary | ICD-10-CM | POA: Diagnosis not present

## 2018-07-25 DIAGNOSIS — Z1231 Encounter for screening mammogram for malignant neoplasm of breast: Secondary | ICD-10-CM

## 2018-07-25 DIAGNOSIS — Z0000 Encounter for general adult medical examination without abnormal findings: Secondary | ICD-10-CM | POA: Diagnosis not present

## 2018-07-25 DIAGNOSIS — I10 Essential (primary) hypertension: Secondary | ICD-10-CM | POA: Diagnosis not present

## 2018-07-25 DIAGNOSIS — E1129 Type 2 diabetes mellitus with other diabetic kidney complication: Secondary | ICD-10-CM | POA: Diagnosis not present

## 2018-07-25 DIAGNOSIS — D649 Anemia, unspecified: Secondary | ICD-10-CM | POA: Diagnosis not present

## 2018-07-25 DIAGNOSIS — E038 Other specified hypothyroidism: Secondary | ICD-10-CM | POA: Diagnosis not present

## 2018-07-25 DIAGNOSIS — E538 Deficiency of other specified B group vitamins: Secondary | ICD-10-CM | POA: Diagnosis not present

## 2018-07-25 DIAGNOSIS — E7849 Other hyperlipidemia: Secondary | ICD-10-CM | POA: Diagnosis not present

## 2018-09-08 ENCOUNTER — Ambulatory Visit
Admission: RE | Admit: 2018-09-08 | Discharge: 2018-09-08 | Disposition: A | Discharge status: Home / Self Care | Payer: Medicare HMO | Source: Ambulatory Visit | Attending: Internal Medicine | Admitting: Internal Medicine

## 2018-09-08 DIAGNOSIS — Z1231 Encounter for screening mammogram for malignant neoplasm of breast: Secondary | ICD-10-CM | POA: Diagnosis not present

## 2018-09-15 ENCOUNTER — Ambulatory Visit: Payer: Medicare HMO | Admitting: Internal Medicine

## 2018-09-21 DIAGNOSIS — F418 Other specified anxiety disorders: Secondary | ICD-10-CM | POA: Diagnosis not present

## 2018-09-21 DIAGNOSIS — R0609 Other forms of dyspnea: Secondary | ICD-10-CM | POA: Diagnosis not present

## 2018-09-21 DIAGNOSIS — R0600 Dyspnea, unspecified: Secondary | ICD-10-CM | POA: Diagnosis not present

## 2018-09-21 DIAGNOSIS — D6489 Other specified anemias: Secondary | ICD-10-CM | POA: Diagnosis not present

## 2018-09-21 DIAGNOSIS — Z6824 Body mass index (BMI) 24.0-24.9, adult: Secondary | ICD-10-CM | POA: Diagnosis not present

## 2018-11-30 DIAGNOSIS — R911 Solitary pulmonary nodule: Secondary | ICD-10-CM | POA: Diagnosis not present

## 2018-12-11 ENCOUNTER — Other Ambulatory Visit: Payer: Self-pay | Admitting: Internal Medicine

## 2018-12-11 DIAGNOSIS — R911 Solitary pulmonary nodule: Secondary | ICD-10-CM

## 2018-12-15 ENCOUNTER — Other Ambulatory Visit: Payer: Medicare HMO

## 2019-01-12 ENCOUNTER — Ambulatory Visit
Admission: RE | Admit: 2019-01-12 | Discharge: 2019-01-12 | Disposition: A | Discharge status: Home / Self Care | Payer: Medicare HMO | Source: Ambulatory Visit | Attending: Internal Medicine | Admitting: Internal Medicine

## 2019-01-12 DIAGNOSIS — R911 Solitary pulmonary nodule: Secondary | ICD-10-CM | POA: Diagnosis not present

## 2019-01-24 ENCOUNTER — Telehealth: Payer: Self-pay

## 2019-01-25 ENCOUNTER — Encounter: Payer: Self-pay | Admitting: Internal Medicine

## 2019-01-25 ENCOUNTER — Ambulatory Visit (INDEPENDENT_AMBULATORY_CARE_PROVIDER_SITE_OTHER): Payer: Medicare HMO | Admitting: Internal Medicine

## 2019-01-25 VITALS — Ht 61.5 in | Wt 125.0 lb

## 2019-01-25 DIAGNOSIS — K222 Esophageal obstruction: Secondary | ICD-10-CM

## 2019-01-25 DIAGNOSIS — R195 Other fecal abnormalities: Secondary | ICD-10-CM | POA: Diagnosis not present

## 2019-01-25 DIAGNOSIS — K219 Gastro-esophageal reflux disease without esophagitis: Secondary | ICD-10-CM | POA: Diagnosis not present

## 2019-01-25 DIAGNOSIS — R1032 Left lower quadrant pain: Secondary | ICD-10-CM | POA: Diagnosis not present

## 2019-01-25 DIAGNOSIS — R151 Fecal smearing: Secondary | ICD-10-CM

## 2019-01-25 DIAGNOSIS — R1310 Dysphagia, unspecified: Secondary | ICD-10-CM | POA: Diagnosis not present

## 2019-01-25 NOTE — Progress Notes (Signed)
HISTORY OF PRESENT ILLNESS:  Jessica [name] is a 68 y.o. adult female with multiple medical problems as listed below who presents today regarding ongoing problems with reflux disease, dysphasia, abdominal pain, loose stools, and fecal incontinence.  Also the need for follow-up colonoscopy.  The patient underwent complete colonoscopy July 2008 to evaluate iron deficiency anemia.  The examination was normal.  Upper endoscopy at that time revealed distal esophageal stricture and 4 cm hiatal hernia.  No erosions.  Duodenal biopsies were negative for celiac disease.  She tells me that she subsequently had colonoscopy and wireless capsule endoscopy in [facility] in 2010.  She tells me both examinations were unrevealing.  She was told to have follow-up colonoscopy in 10 years.  I saw the patient Dec 14, 2017 for upper endoscopy due to the chief complaint of dysphasia.  She had a benign distal esophageal stricture which was dilated with a balloon up to 18 mm.  She tells me that she had no significant improvement in her intermittent solid food dysphasia despite the dilation.  She also complains of throat discomfort with singing which she thinks may be related to reflux.  She describes dyspeptic symptoms.  All of this despite omeprazole 40 mg twice daily and famotidine 20 mg twice daily.  She continues with intermittent solid food dysphasia items such as bread.  Next, she reports intermittent problems with abdominal pain 2 or 3 times per week.  Often left lower quadrant pain.  May last a few hours.  No obvious exacerbating or relieving factors.  She does describe having had problems with loose stools and fecal leakage which had improved somewhat with Metamucil.  No problems with recurrent anemia.  Review of outside blood work from September 21, 2018 finds normal CBC with hemoglobin 12.1.  Last hemoglobin A1c 6.6.  TSH 0.3.  CT scan 2007 showed that the patient is status post cholecystectomy and hysterectomy.  No  other abnormalities.  REVIEW OF SYSTEMS:  All non-GI ROS negative unless otherwise stated in the HPI except for anxiety  Past Medical History:  Diagnosis Date  . Anemia   . Anxiety   . CKD (chronic kidney disease)    CKD III per PCP notes-pt denies  . Congenital blindness   . Diabetes mellitus   . Diverticula of small intestine   . Esophageal stricture   . GERD (gastroesophageal reflux disease)   . H/O hiatal hernia   . Hyperlipidemia   . Hypertension   . Hypothyroidism   . Iron deficiency anemia   . Low magnesium level   . Low sodium levels   . Osteopenia   . Osteoporosis   . Postoperative nausea and vomiting   . Renal disease   . Retinitis   . SVT (supraventricular tachycardia) ([place])   . Vitamin B 12 deficiency     Past Surgical History:  Procedure Laterality Date  . BACK SURGERY    . CARPAL TUNNEL RELEASE     Right   . CESAREAN SECTION    . CHOLECYSTECTOMY  1985  . COLONOSCOPY    . HERNIA REPAIR    . Right knee arthroscopy  1993  . Right thyroidectomy  2007  . TOTAL ABDOMINAL HYSTERECTOMY W/ BILATERAL SALPINGOOPHORECTOMY  1993  . UPPER GASTROINTESTINAL ENDOSCOPY      Social History Jessica [name]  reports that he has never smoked. He has never used smokeless tobacco. He reports that he does not drink alcohol or use drugs.  family history includes  COPD in his father; CVA in his mother; Coronary artery disease in his father; Diabetes Mellitus II in his maternal grandmother and mother; Hyperlipidemia in his sister; Hypertension in his sister and sister; Uterine cancer in his sister.  Allergies  Allergen Reactions  . Codeine Shortness Of Breath and Nausea And Vomiting  . Erythromycin Nausea And Vomiting  . Levaquin [Levofloxacin] Shortness Of Breath       PHYSICAL EXAMINATION: No physical examination with telehealth visit    ASSESSMENT:  1.  GERD.  Not clear to me that the dyspeptic or throat symptoms she describes are attributable to GERD.  She is  on high-dose acid suppressive therapies. 2.  Intermittent solid food dysphasia.  This despite previous balloon dilation to 18 mm.  Suspect persistent stricture 3.  Intermittent problems with abdominal pain.  No alarm features.  Not advancing 4.  Routine colonoscopy 2010.  Described as normal.  Normal exam 2008.  Due for follow-up 5.  Intermittent problems with loose stools and urgency.  May be bile salt related diarrhea.  Blood microscopic colitis.  Possibly medication as she is on metformin and high-dose acid suppressive therapy   PLAN:  1.  Reflux precautions 2.  Continue current acid suppressive regimen for the time being 3.  SCHEDULE EGD with esophageal dilation.  Will use larger dilator as she did not respond to 18 mm dilation.The nature of the procedure, as well as the risks, benefits, and alternatives were carefully and thoroughly reviewed with the patient. Ample time for discussion and questions allowed. The patient understood, was satisfied, and agreed to proceed. 4.  SCHEDULE COLONOSCOPY with biopsies to provide colon cancer screening and evaluate change in bowel habits.The nature of the procedure, as well as the risks, benefits, and alternatives were carefully and thoroughly reviewed with the patient. Ample time for discussion and questions allowed. The patient understood, was satisfied, and agreed to proceed. 5.  Continue Metamucil for loose stools and incontinence as this has been helpful.  This telehealth medicine visit was initiated by and consented for by the patient.  She was in her home and I was in my office during the encounter.  She understands her may be a professional charge associated for this service.

## 2019-01-29 ENCOUNTER — Telehealth: Payer: Self-pay | Admitting: Internal Medicine

## 2019-01-29 MED ORDER — NA SULFATE-K SULFATE-MG SULF 17.5-3.13-1.6 GM/177ML PO SOLN: 1.0000 | Freq: Once | ORAL | 0 refills | Status: AC

## 2019-01-29 NOTE — Addendum Note (Signed)
Addended by: Audrea Muscat on: 01/29/2019 05:02 PM   Modules accepted: Orders

## 2019-01-30 NOTE — Telephone Encounter (Signed)
Scheduled endo/ colon and mailed instructions

## 2019-01-30 NOTE — Telephone Encounter (Signed)
Scheduled egd/colon and mailed instructions

## 2019-01-30 NOTE — Patient Instructions (Signed)
1.  Reflux precautions  2.  Continue current acid suppressive regimen for the time being  3.  You have been scheduled for an endoscopy and colonoscopy. Please follow the written instructions given to you at your visit today. Please pick up your prep supplies at the pharmacy within the next 1-3 days. If you use inhalers (even only as needed), please bring them with you on the day of your procedure.  4. Continue Metamucil for loose stools and incontinence as this has been helpful.

## 2019-01-31 DIAGNOSIS — I700 Atherosclerosis of aorta: Secondary | ICD-10-CM | POA: Diagnosis not present

## 2019-01-31 DIAGNOSIS — R1032 Left lower quadrant pain: Secondary | ICD-10-CM | POA: Diagnosis not present

## 2019-01-31 DIAGNOSIS — E785 Hyperlipidemia, unspecified: Secondary | ICD-10-CM | POA: Diagnosis not present

## 2019-01-31 DIAGNOSIS — R911 Solitary pulmonary nodule: Secondary | ICD-10-CM | POA: Diagnosis not present

## 2019-01-31 DIAGNOSIS — R1310 Dysphagia, unspecified: Secondary | ICD-10-CM | POA: Diagnosis not present

## 2019-01-31 DIAGNOSIS — E1129 Type 2 diabetes mellitus with other diabetic kidney complication: Secondary | ICD-10-CM | POA: Diagnosis not present

## 2019-01-31 DIAGNOSIS — I10 Essential (primary) hypertension: Secondary | ICD-10-CM | POA: Diagnosis not present

## 2019-02-01 ENCOUNTER — Encounter: Payer: Self-pay | Admitting: Internal Medicine

## 2019-02-06 ENCOUNTER — Telehealth: Payer: Self-pay | Admitting: Internal Medicine

## 2019-02-06 NOTE — Telephone Encounter (Signed)

## 2019-02-07 ENCOUNTER — Other Ambulatory Visit: Payer: Self-pay

## 2019-02-07 ENCOUNTER — Ambulatory Visit (AMBULATORY_SURGERY_CENTER): Payer: Medicare HMO | Admitting: Internal Medicine

## 2019-02-07 ENCOUNTER — Encounter: Payer: Self-pay | Admitting: Internal Medicine

## 2019-02-07 VITALS — BP 111/72 | HR 75 | Temp 97.2°F | Resp 19 | Ht 61.5 in | Wt 125.0 lb

## 2019-02-07 DIAGNOSIS — E1129 Type 2 diabetes mellitus with other diabetic kidney complication: Secondary | ICD-10-CM | POA: Diagnosis not present

## 2019-02-07 DIAGNOSIS — K219 Gastro-esophageal reflux disease without esophagitis: Secondary | ICD-10-CM | POA: Diagnosis not present

## 2019-02-07 DIAGNOSIS — R1032 Left lower quadrant pain: Secondary | ICD-10-CM | POA: Diagnosis not present

## 2019-02-07 DIAGNOSIS — K222 Esophageal obstruction: Secondary | ICD-10-CM | POA: Diagnosis not present

## 2019-02-07 DIAGNOSIS — Z1211 Encounter for screening for malignant neoplasm of colon: Secondary | ICD-10-CM

## 2019-02-07 DIAGNOSIS — R1310 Dysphagia, unspecified: Secondary | ICD-10-CM

## 2019-02-07 DIAGNOSIS — D122 Benign neoplasm of ascending colon: Secondary | ICD-10-CM | POA: Diagnosis not present

## 2019-02-07 DIAGNOSIS — R151 Fecal smearing: Secondary | ICD-10-CM

## 2019-02-07 MED ORDER — SODIUM CHLORIDE 0.9 % IV SOLN: 500.0000 mL | Freq: Once | INTRAVENOUS | Status: DC

## 2019-02-07 NOTE — Op Note (Signed)
[place] Patient Name: Jessica [name] Procedure Date: 02/07/2019 2:16 PM MRN: [id number] Endoscopist: Docia Chuck. Henrene Pastor , MD Age: 68 Referring MD:  Date of Birth: 04-01-1951 Gender: Female Account #: [id number] Procedure:                Colonoscopy with cold snare polypectomy x 2 Indications:              Screening for colorectal malignant neoplasm.                            Previous negative colonoscopy 2008 and 2010 Medicines:                Monitored Anesthesia Care Procedure:                Pre-Anesthesia Assessment:                           - Prior to the procedure, a History and Physical                            was performed, and patient medications and                            allergies were reviewed. The patient's tolerance of                            previous anesthesia was also reviewed. The risks                            and benefits of the procedure and the sedation                            options and risks were discussed with the patient.                            All questions were answered, and informed consent                            was obtained. Prior Anticoagulants: The patient has                            taken no previous anticoagulant or antiplatelet                            agents. ASA Grade Assessment: II - A patient with                            mild systemic disease. After reviewing the risks                            and benefits, the patient was deemed in                            satisfactory condition to undergo the procedure.  After obtaining informed consent, the colonoscope                            was passed under direct vision. Throughout the                            procedure, the patient's blood pressure, pulse, and                            oxygen saturations were monitored continuously. The                            Colonoscope was introduced through the anus and         advanced to the the cecum, identified by                            appendiceal orifice and ileocecal valve. The                            ileocecal valve, appendiceal orifice, and rectum                            were photographed. The quality of the bowel                            preparation was excellent. The colonoscopy was                            performed without difficulty. The patient tolerated                            the procedure well. The bowel preparation used was                            SUPREP via split dose instruction. Scope In: 2:56:12 PM Scope Out: 3:14:11 PM Scope Withdrawal Time: 0 hours 11 minutes 53 seconds  Total Procedure Duration: 0 hours 17 minutes 59 seconds  Findings:                 Two polyps were found in the ascending colon. The                            polyps were 1 to 2 mm in size. These polyps were                            removed with a cold snare. Resection and retrieval                            were complete.                           Multiple diverticula were found in the sigmoid  colon and right colon. The sigmoid colon was                            somewhat fixed and angulated                           The exam was otherwise without abnormality on                            direct and retroflexion views. Complications:            No immediate complications. Estimated blood loss:                            None. Estimated Blood Loss:     Estimated blood loss: none. Impression:               - Two 1 to 2 mm polyps in the ascending colon,                            removed with a cold snare. Resected and retrieved.                           - Diverticulosis in the sigmoid colon and in the                            right colon. Somewhat fixed and angulated sigmoid                            colon.                           - The examination was otherwise normal on direct                             and retroflexion views. Recommendation:           - Repeat colonoscopy is not recommended for                            surveillance.                           - Patient has a contact number available for                            emergencies. The signs and symptoms of potential                            delayed complications were discussed with the                            patient. Return to normal activities tomorrow.                            Written discharge instructions were provided to the  patient.                           - Resume previous diet.                           - Continue present medications.                           - Await pathology results. Docia Chuck. Henrene Pastor, MD 02/07/2019 3:30:05 PM This report has been signed electronically.

## 2019-02-07 NOTE — Progress Notes (Signed)
Called to room to assist during endoscopic procedure.  Patient ID and intended procedure confirmed with present staff. Received instructions for my participation in the procedure from the performing physician.  

## 2019-02-07 NOTE — Patient Instructions (Addendum)
2 polyps removed today--Dr Henrene Pastor will mail you a letter in about 2 weeks/just call us for results over telephone Diverticulosis was noted today--a normal part of aging Your esophagus was dilated so clear liquids for one hour, then soft foods for the rest of today     YOU HAD AN ENDOSCOPIC PROCEDURE TODAY AT [place]:   Refer to the procedure report that was given to you for any specific questions about what was found during the examination.  If the procedure report does not answer your questions, please call your gastroenterologist to clarify.  If you requested that your care partner not be given the details of your procedure findings, then the procedure report has been included in a sealed envelope for you to review at your convenience later.  YOU SHOULD EXPECT: Some feelings of bloating in the abdomen. Passage of more gas than usual.  Walking can help get rid of the air that was put into your GI tract during the procedure and reduce the bloating. If you had a lower endoscopy (such as a colonoscopy or flexible sigmoidoscopy) you may notice spotting of blood in your stool or on the toilet paper. If you underwent a bowel prep for your procedure, you may not have a normal bowel movement for a few days.  Please Note:  You might notice some irritation and congestion in your nose or some drainage.  This is from the oxygen used during your procedure.  There is no need for concern and it should clear up in a day or so.  SYMPTOMS TO REPORT IMMEDIATELY:   Following lower endoscopy (colonoscopy or flexible sigmoidoscopy):  Excessive amounts of blood in the stool  Significant tenderness or worsening of abdominal pains  Swelling of the abdomen that is new, acute  Fever of 100F or higher   Following upper endoscopy (EGD)  Vomiting of blood or coffee ground material  New chest pain or pain under the shoulder blades  Painful or persistently difficult swallowing  New shortness of  breath  Fever of 100F or higher  Black, tarry-looking stools  For urgent or emergent issues, a gastroenterologist can be reached at any hour by calling [phone].   DIET:  We do recommend a small meal at first, but then you may proceed to your regular diet.  Drink plenty of fluids but you should avoid alcoholic beverages for 24 hours.  ACTIVITY:  You should plan to take it easy for the rest of today and you should NOT DRIVE or use heavy machinery until tomorrow (because of the sedation medicines used during the test).    FOLLOW UP: Our staff will call the number listed on your records 48-72 hours following your procedure to check on you and address any questions or concerns that you may have regarding the information given to you following your procedure. If we do not reach you, we will leave a message.  We will attempt to reach you two times.  During this call, we will ask if you have developed any symptoms of COVID 19. If you develop any symptoms (ie: fever, flu-like symptoms, shortness of breath, cough etc.) before then, please call [phone].  If you test positive for Covid 19 in the 2 weeks post procedure, please call and report this information to [place].    If any biopsies were taken you will be contacted by phone or by letter within the next 1-3 weeks.  Please call us at [phone] if you have not  heard about the biopsies in 3 weeks.    SIGNATURES/CONFIDENTIALITY: You and/or your care partner have signed paperwork which will be entered into your electronic medical record.  These signatures attest to the fact that that the information above on your After Visit Summary has been reviewed and is understood.  Full responsibility of the confidentiality of this discharge information lies with you and/or your care-partner.

## 2019-02-07 NOTE — Progress Notes (Signed)
Jessica [name] VS- Rica Mote

## 2019-02-07 NOTE — Progress Notes (Signed)
PT taken to PACU. Monitors in place. VSS. Report given to RN. 

## 2019-02-07 NOTE — Op Note (Signed)
[place] Patient Name: Jessica [name] Procedure Date: 02/07/2019 2:15 PM MRN: [id number] Endoscopist: Docia Chuck. Henrene Pastor , MD Age: 68 Referring MD:  Date of Birth: [birth year] Gender: Female Account #: [id number] Procedure:                Upper GI endoscopy with balloon dilation of the                            esophagus?"20 mm Indications:              Dysphagia. GERD Medicines:                Monitored Anesthesia Care Procedure:                Pre-Anesthesia Assessment:                           - Prior to the procedure, a History and Physical                            was performed, and patient medications and                            allergies were reviewed. The patient's tolerance of                            previous anesthesia was also reviewed. The risks                            and benefits of the procedure and the sedation                            options and risks were discussed with the patient.                            All questions were answered, and informed consent                            was obtained. Prior Anticoagulants: The patient has                            taken no previous anticoagulant or antiplatelet                            agents. ASA Grade Assessment: II - A patient with                            mild systemic disease. After reviewing the risks                            and benefits, the patient was deemed in                            satisfactory condition to undergo the procedure.  After obtaining informed consent, the endoscope was                            passed under direct vision. Throughout the                            procedure, the patient's blood pressure, pulse, and                            oxygen saturations were monitored continuously. The                            Endoscope was introduced through the mouth, and                            advanced to the second part of duodenum. The  upper                            GI endoscopy was accomplished without difficulty.                            The patient tolerated the procedure well. Scope In: Scope Out: Findings:                 One benign-appearing, intrinsic mild stenosis was                            found 30 cm from the incisors. This stenosis                            measured 1.5 cm (inner diameter). A TTS dilator was                            passed through the scope. Dilation with an 18-19-20                            mm balloon dilator was performed to 20 mm.                           The stomach was normal and revealed a moderate                            hiatal hernia.                           The examined duodenum was normal.                           The cardia and gastric fundus were normal on                            retroflexion. Complications:            No immediate complications. Estimated Blood Loss:     Estimated blood loss: none. Impression:               -  Benign-appearing esophageal stenosis. Dilated.                           - Normal stomach.                           - Normal examined duodenum.                           - No specimens collected. Recommendation:           1. Patient has a contact number available for                            emergencies. The signs and symptoms of potential                            delayed complications were discussed with the                            patient. Return to normal activities tomorrow.                            Written discharge instructions were provided to the                            patient.                           2. Post dilation diet                           3. Continue current medications                           4. Return to the care of your primary provider. GI                            follow-up as needed. Docia Chuck. Henrene Pastor, MD 02/07/2019 3:34:10 PM This report has been signed electronically.

## 2019-02-09 ENCOUNTER — Telehealth: Payer: Self-pay | Admitting: *Deleted

## 2019-02-09 NOTE — Telephone Encounter (Signed)
Have you developed a fever since your procedure? no 2.   Have you had an respiratory symptoms (SOB or cough) since your procedure? no  3.   Have you tested positive for COVID 19 since your procedure no  4.   Have you had any family members/close contacts diagnosed with the COVID 19 since your procedure?  no   If yes to any of these questions please route to Joylene John, RN and Alphonsa Gin, Therapist, sports.  Follow up Call-  Call back number 02/07/2019 12/14/2017  Post procedure Call Back phone  # [phone]  Permission to leave phone message Yes Yes  Some recent data might be hidden     Patient questions:  Do you have a fever, pain , or abdominal swelling? No. Pain Score  0 *  Have you tolerated food without any problems? Yes.    Have you been able to return to your normal activities? Yes.    Do you have any questions about your discharge instructions: Diet   No. Medications  No. Follow up visit  No.  Do you have questions or concerns about your Care? No.  Actions: * If pain score is 4 or above: No action needed, pain <4.

## 2019-02-13 ENCOUNTER — Encounter: Payer: Self-pay | Admitting: Internal Medicine

## 2019-03-21 DIAGNOSIS — Z888 Allergy status to other drugs, medicaments and biological substances status: Secondary | ICD-10-CM | POA: Diagnosis not present

## 2019-03-21 DIAGNOSIS — J209 Acute bronchitis, unspecified: Secondary | ICD-10-CM | POA: Diagnosis not present

## 2019-03-21 DIAGNOSIS — I2510 Atherosclerotic heart disease of native coronary artery without angina pectoris: Secondary | ICD-10-CM | POA: Diagnosis not present

## 2019-03-21 DIAGNOSIS — Z885 Allergy status to narcotic agent status: Secondary | ICD-10-CM | POA: Diagnosis not present

## 2019-03-21 DIAGNOSIS — J40 Bronchitis, not specified as acute or chronic: Secondary | ICD-10-CM | POA: Diagnosis not present

## 2019-03-21 DIAGNOSIS — E1165 Type 2 diabetes mellitus with hyperglycemia: Secondary | ICD-10-CM | POA: Diagnosis not present

## 2019-03-21 DIAGNOSIS — E119 Type 2 diabetes mellitus without complications: Secondary | ICD-10-CM | POA: Diagnosis not present

## 2019-03-21 DIAGNOSIS — R0602 Shortness of breath: Secondary | ICD-10-CM | POA: Diagnosis not present

## 2019-03-21 DIAGNOSIS — Z881 Allergy status to other antibiotic agents status: Secondary | ICD-10-CM | POA: Diagnosis not present

## 2019-03-21 DIAGNOSIS — I10 Essential (primary) hypertension: Secondary | ICD-10-CM | POA: Diagnosis not present

## 2019-03-21 DIAGNOSIS — E079 Disorder of thyroid, unspecified: Secondary | ICD-10-CM | POA: Diagnosis not present

## 2019-07-12 ENCOUNTER — Other Ambulatory Visit: Payer: Self-pay | Admitting: Internal Medicine

## 2019-07-12 DIAGNOSIS — R911 Solitary pulmonary nodule: Secondary | ICD-10-CM

## 2019-07-24 ENCOUNTER — Other Ambulatory Visit: Payer: Medicare HMO

## 2019-07-30 DIAGNOSIS — E7849 Other hyperlipidemia: Secondary | ICD-10-CM | POA: Diagnosis not present

## 2019-07-30 DIAGNOSIS — E038 Other specified hypothyroidism: Secondary | ICD-10-CM | POA: Diagnosis not present

## 2019-07-30 DIAGNOSIS — E538 Deficiency of other specified B group vitamins: Secondary | ICD-10-CM | POA: Diagnosis not present

## 2019-07-30 DIAGNOSIS — M859 Disorder of bone density and structure, unspecified: Secondary | ICD-10-CM | POA: Diagnosis not present

## 2019-07-30 DIAGNOSIS — E1129 Type 2 diabetes mellitus with other diabetic kidney complication: Secondary | ICD-10-CM | POA: Diagnosis not present

## 2019-08-01 ENCOUNTER — Ambulatory Visit
Admission: RE | Admit: 2019-08-01 | Discharge: 2019-08-01 | Disposition: A | Discharge status: Home / Self Care | Payer: Medicare HMO | Source: Ambulatory Visit | Attending: Internal Medicine | Admitting: Internal Medicine

## 2019-08-01 ENCOUNTER — Other Ambulatory Visit: Payer: Self-pay

## 2019-08-01 DIAGNOSIS — R7889 Finding of other specified substances, not normally found in blood: Secondary | ICD-10-CM | POA: Diagnosis not present

## 2019-08-01 DIAGNOSIS — Z1331 Encounter for screening for depression: Secondary | ICD-10-CM | POA: Diagnosis not present

## 2019-08-01 DIAGNOSIS — R911 Solitary pulmonary nodule: Secondary | ICD-10-CM | POA: Diagnosis not present

## 2019-08-01 DIAGNOSIS — D649 Anemia, unspecified: Secondary | ICD-10-CM | POA: Diagnosis not present

## 2019-08-01 DIAGNOSIS — D6489 Other specified anemias: Secondary | ICD-10-CM | POA: Diagnosis not present

## 2019-08-01 DIAGNOSIS — E538 Deficiency of other specified B group vitamins: Secondary | ICD-10-CM | POA: Diagnosis not present

## 2019-08-01 DIAGNOSIS — Z0000 Encounter for general adult medical examination without abnormal findings: Secondary | ICD-10-CM | POA: Diagnosis not present

## 2019-08-01 DIAGNOSIS — R82998 Other abnormal findings in urine: Secondary | ICD-10-CM | POA: Diagnosis not present

## 2019-08-01 DIAGNOSIS — D692 Other nonthrombocytopenic purpura: Secondary | ICD-10-CM | POA: Diagnosis not present

## 2019-08-01 DIAGNOSIS — E1129 Type 2 diabetes mellitus with other diabetic kidney complication: Secondary | ICD-10-CM | POA: Diagnosis not present

## 2019-08-01 DIAGNOSIS — E785 Hyperlipidemia, unspecified: Secondary | ICD-10-CM | POA: Diagnosis not present

## 2019-08-01 DIAGNOSIS — Z1339 Encounter for screening examination for other mental health and behavioral disorders: Secondary | ICD-10-CM | POA: Diagnosis not present

## 2019-08-01 DIAGNOSIS — N179 Acute kidney failure, unspecified: Secondary | ICD-10-CM | POA: Diagnosis not present

## 2019-08-01 DIAGNOSIS — I10 Essential (primary) hypertension: Secondary | ICD-10-CM | POA: Diagnosis not present

## 2019-08-01 DIAGNOSIS — E039 Hypothyroidism, unspecified: Secondary | ICD-10-CM | POA: Diagnosis not present

## 2019-08-02 DIAGNOSIS — R7889 Finding of other specified substances, not normally found in blood: Secondary | ICD-10-CM | POA: Diagnosis not present

## 2019-09-17 DIAGNOSIS — E7849 Other hyperlipidemia: Secondary | ICD-10-CM | POA: Diagnosis not present

## 2019-10-22 DIAGNOSIS — M65351 Trigger finger, right little finger: Secondary | ICD-10-CM | POA: Diagnosis not present

## 2019-10-22 DIAGNOSIS — M79641 Pain in right hand: Secondary | ICD-10-CM | POA: Diagnosis not present

## 2019-10-22 DIAGNOSIS — M67432 Ganglion, left wrist: Secondary | ICD-10-CM | POA: Diagnosis not present

## 2019-10-22 DIAGNOSIS — M79642 Pain in left hand: Secondary | ICD-10-CM | POA: Diagnosis not present

## 2019-10-22 DIAGNOSIS — G5602 Carpal tunnel syndrome, left upper limb: Secondary | ICD-10-CM | POA: Diagnosis not present

## 2019-11-07 DIAGNOSIS — M67432 Ganglion, left wrist: Secondary | ICD-10-CM | POA: Diagnosis not present

## 2019-11-07 DIAGNOSIS — G5602 Carpal tunnel syndrome, left upper limb: Secondary | ICD-10-CM | POA: Diagnosis not present

## 2019-11-07 DIAGNOSIS — M65351 Trigger finger, right little finger: Secondary | ICD-10-CM | POA: Diagnosis not present

## 2019-12-21 ENCOUNTER — Other Ambulatory Visit: Payer: Self-pay | Admitting: Orthopedic Surgery

## 2019-12-21 DIAGNOSIS — G5602 Carpal tunnel syndrome, left upper limb: Secondary | ICD-10-CM | POA: Diagnosis not present

## 2019-12-21 DIAGNOSIS — M67432 Ganglion, left wrist: Secondary | ICD-10-CM | POA: Diagnosis not present

## 2019-12-30 ENCOUNTER — Encounter (HOSPITAL_COMMUNITY): Payer: Self-pay | Admitting: Emergency Medicine

## 2019-12-30 ENCOUNTER — Inpatient Hospital Stay (HOSPITAL_COMMUNITY)
Admission: EM | Admit: 2019-12-30 | Discharge: 2020-01-01 | DRG: 251 | Disposition: A | Discharge status: Home / Self Care | Payer: Medicare HMO | Attending: Cardiovascular Disease | Admitting: Cardiovascular Disease

## 2019-12-30 ENCOUNTER — Encounter (HOSPITAL_COMMUNITY)
Admission: EM | Disposition: A | Discharge status: Home / Self Care | Payer: Self-pay | Source: Home / Self Care | Attending: Cardiovascular Disease

## 2019-12-30 ENCOUNTER — Emergency Department (HOSPITAL_COMMUNITY): Payer: Medicare HMO

## 2019-12-30 ENCOUNTER — Other Ambulatory Visit: Payer: Self-pay

## 2019-12-30 DIAGNOSIS — Z825 Family history of asthma and other chronic lower respiratory diseases: Secondary | ICD-10-CM

## 2019-12-30 DIAGNOSIS — Z8679 Personal history of other diseases of the circulatory system: Secondary | ICD-10-CM

## 2019-12-30 DIAGNOSIS — Z9861 Coronary angioplasty status: Secondary | ICD-10-CM

## 2019-12-30 DIAGNOSIS — Z8249 Family history of ischemic heart disease and other diseases of the circulatory system: Secondary | ICD-10-CM | POA: Diagnosis not present

## 2019-12-30 DIAGNOSIS — F419 Anxiety disorder, unspecified: Secondary | ICD-10-CM | POA: Diagnosis present

## 2019-12-30 DIAGNOSIS — Z83438 Family history of other disorder of lipoprotein metabolism and other lipidemia: Secondary | ICD-10-CM | POA: Diagnosis not present

## 2019-12-30 DIAGNOSIS — I2111 ST elevation (STEMI) myocardial infarction involving right coronary artery: Secondary | ICD-10-CM | POA: Diagnosis not present

## 2019-12-30 DIAGNOSIS — Z90710 Acquired absence of both cervix and uterus: Secondary | ICD-10-CM

## 2019-12-30 DIAGNOSIS — Z823 Family history of stroke: Secondary | ICD-10-CM | POA: Diagnosis not present

## 2019-12-30 DIAGNOSIS — I9581 Postprocedural hypotension: Secondary | ICD-10-CM | POA: Diagnosis not present

## 2019-12-30 DIAGNOSIS — H547 Unspecified visual loss: Secondary | ICD-10-CM | POA: Diagnosis not present

## 2019-12-30 DIAGNOSIS — Z885 Allergy status to narcotic agent status: Secondary | ICD-10-CM

## 2019-12-30 DIAGNOSIS — I213 ST elevation (STEMI) myocardial infarction of unspecified site: Secondary | ICD-10-CM | POA: Diagnosis present

## 2019-12-30 DIAGNOSIS — I129 Hypertensive chronic kidney disease with stage 1 through stage 4 chronic kidney disease, or unspecified chronic kidney disease: Secondary | ICD-10-CM | POA: Diagnosis present

## 2019-12-30 DIAGNOSIS — E785 Hyperlipidemia, unspecified: Secondary | ICD-10-CM | POA: Diagnosis present

## 2019-12-30 DIAGNOSIS — Z833 Family history of diabetes mellitus: Secondary | ICD-10-CM | POA: Diagnosis not present

## 2019-12-30 DIAGNOSIS — E890 Postprocedural hypothyroidism: Secondary | ICD-10-CM | POA: Diagnosis present

## 2019-12-30 DIAGNOSIS — E1122 Type 2 diabetes mellitus with diabetic chronic kidney disease: Secondary | ICD-10-CM | POA: Diagnosis present

## 2019-12-30 DIAGNOSIS — Z881 Allergy status to other antibiotic agents status: Secondary | ICD-10-CM

## 2019-12-30 DIAGNOSIS — R54 Age-related physical debility: Secondary | ICD-10-CM | POA: Diagnosis present

## 2019-12-30 DIAGNOSIS — D62 Acute posthemorrhagic anemia: Secondary | ICD-10-CM | POA: Diagnosis not present

## 2019-12-30 DIAGNOSIS — I97638 Postprocedural hematoma of a circulatory system organ or structure following other circulatory system procedure: Secondary | ICD-10-CM | POA: Diagnosis not present

## 2019-12-30 DIAGNOSIS — Z20822 Contact with and (suspected) exposure to covid-19: Secondary | ICD-10-CM | POA: Diagnosis present

## 2019-12-30 DIAGNOSIS — L7632 Postprocedural hematoma of skin and subcutaneous tissue following other procedure: Secondary | ICD-10-CM | POA: Diagnosis not present

## 2019-12-30 DIAGNOSIS — N179 Acute kidney failure, unspecified: Secondary | ICD-10-CM | POA: Diagnosis not present

## 2019-12-30 DIAGNOSIS — K219 Gastro-esophageal reflux disease without esophagitis: Secondary | ICD-10-CM | POA: Diagnosis present

## 2019-12-30 DIAGNOSIS — I10 Essential (primary) hypertension: Secondary | ICD-10-CM | POA: Diagnosis not present

## 2019-12-30 DIAGNOSIS — R05 Cough: Secondary | ICD-10-CM | POA: Diagnosis not present

## 2019-12-30 DIAGNOSIS — D72829 Elevated white blood cell count, unspecified: Secondary | ICD-10-CM | POA: Diagnosis present

## 2019-12-30 DIAGNOSIS — Z9049 Acquired absence of other specified parts of digestive tract: Secondary | ICD-10-CM | POA: Diagnosis not present

## 2019-12-30 DIAGNOSIS — N1831 Chronic kidney disease, stage 3a: Secondary | ICD-10-CM | POA: Diagnosis present

## 2019-12-30 DIAGNOSIS — R079 Chest pain, unspecified: Secondary | ICD-10-CM | POA: Diagnosis not present

## 2019-12-30 DIAGNOSIS — M810 Age-related osteoporosis without current pathological fracture: Secondary | ICD-10-CM | POA: Diagnosis present

## 2019-12-30 DIAGNOSIS — I2510 Atherosclerotic heart disease of native coronary artery without angina pectoris: Secondary | ICD-10-CM

## 2019-12-30 DIAGNOSIS — I2119 ST elevation (STEMI) myocardial infarction involving other coronary artery of inferior wall: Principal | ICD-10-CM | POA: Diagnosis present

## 2019-12-30 DIAGNOSIS — Z90722 Acquired absence of ovaries, bilateral: Secondary | ICD-10-CM

## 2019-12-30 DIAGNOSIS — Z79899 Other long term (current) drug therapy: Secondary | ICD-10-CM

## 2019-12-30 DIAGNOSIS — Z8049 Family history of malignant neoplasm of other genital organs: Secondary | ICD-10-CM | POA: Diagnosis not present

## 2019-12-30 DIAGNOSIS — R0789 Other chest pain: Secondary | ICD-10-CM | POA: Diagnosis not present

## 2019-12-30 DIAGNOSIS — Z79890 Hormone replacement therapy (postmenopausal): Secondary | ICD-10-CM

## 2019-12-30 HISTORY — PX: CORONARY BALLOON ANGIOPLASTY: CATH118233

## 2019-12-30 HISTORY — PX: LEFT HEART CATH AND CORONARY ANGIOGRAPHY: CATH118249

## 2019-12-30 HISTORY — PX: CORONARY/GRAFT ACUTE MI REVASCULARIZATION: CATH118305

## 2019-12-30 LAB — BASIC METABOLIC PANEL
Anion gap: 8 (ref 5–15)
BUN: 33 mg/dL — ABNORMAL HIGH (ref 8–23)
CO2: 22 mmol/L (ref 22–32)
Calcium: 9.4 mg/dL (ref 8.9–10.3)
Chloride: 101 mmol/L (ref 98–111)
Creatinine, Ser: 1.82 mg/dL — ABNORMAL HIGH (ref 0.44–1.00)
GFR calc Af Amer: 32 mL/min — ABNORMAL LOW (ref >60)
GFR calc non Af Amer: 28 mL/min — ABNORMAL LOW (ref >60)
Glucose, Bld: 203 mg/dL — ABNORMAL HIGH (ref 70–99)
Potassium: 5 mmol/L (ref 3.5–5.1)
Sodium: 131 mmol/L — ABNORMAL LOW (ref 135–145)

## 2019-12-30 LAB — CBC WITH DIFFERENTIAL/PLATELET
Abs Immature Granulocytes: 0.08 10*3/uL — ABNORMAL HIGH (ref 0.00–0.07)
Basophils Absolute: 0 10*3/uL (ref 0.0–0.1)
Basophils Relative: 0 %
Eosinophils Absolute: 0.2 10*3/uL (ref 0.0–0.5)
Eosinophils Relative: 2 %
HCT: 29.8 % — ABNORMAL LOW (ref 36.0–46.0)
Hemoglobin: 9.8 g/dL — ABNORMAL LOW (ref 12.0–15.0)
Immature Granulocytes: 1 %
Lymphocytes Relative: 10 %
Lymphs Abs: 1.1 10*3/uL (ref 0.7–4.0)
MCH: 31.4 pg (ref 26.0–34.0)
MCHC: 32.9 g/dL (ref 30.0–36.0)
MCV: 95.5 fL (ref 80.0–100.0)
Monocytes Absolute: 0.9 10*3/uL (ref 0.1–1.0)
Monocytes Relative: 8 %
Neutro Abs: 8.7 10*3/uL — ABNORMAL HIGH (ref 1.7–7.7)
Neutrophils Relative %: 79 %
Platelets: 263 10*3/uL (ref 150–400)
RBC: 3.12 MIL/uL — ABNORMAL LOW (ref 3.87–5.11)
RDW: 12.7 % (ref 11.5–15.5)
WBC: 10.9 10*3/uL — ABNORMAL HIGH (ref 4.0–10.5)
nRBC: 0 % (ref 0.0–0.2)

## 2019-12-30 LAB — HEPATIC FUNCTION PANEL
ALT: 51 U/L — ABNORMAL HIGH (ref 0–44)
AST: 53 U/L — ABNORMAL HIGH (ref 15–41)
Albumin: 3.5 g/dL (ref 3.5–5.0)
Alkaline Phosphatase: 31 U/L — ABNORMAL LOW (ref 38–126)
Bilirubin, Direct: 0.1 mg/dL (ref 0.0–0.2)
Indirect Bilirubin: 0.3 mg/dL (ref 0.3–0.9)
Total Bilirubin: 0.4 mg/dL (ref 0.3–1.2)
Total Protein: 6.4 g/dL — ABNORMAL LOW (ref 6.5–8.1)

## 2019-12-30 LAB — TROPONIN I (HIGH SENSITIVITY)
Troponin I (High Sensitivity): 1089 ng/L (ref <18)
Troponin I (High Sensitivity): 11353 ng/L (ref <18)
Troponin I (High Sensitivity): 16469 ng/L (ref <18)
Troponin I (High Sensitivity): 13084 ng/L (ref <18)

## 2019-12-30 LAB — HIV ANTIBODY (ROUTINE TESTING W REFLEX): HIV Screen 4th Generation wRfx: NONREACTIVE

## 2019-12-30 LAB — CBC
HCT: 39 % (ref 36.0–46.0)
Hemoglobin: 12.5 g/dL (ref 12.0–15.0)
MCH: 31.1 pg (ref 26.0–34.0)
MCHC: 32.1 g/dL (ref 30.0–36.0)
MCV: 97 fL (ref 80.0–100.0)
Platelets: 298 10*3/uL (ref 150–400)
RBC: 4.02 MIL/uL (ref 3.87–5.11)
RDW: 12.7 % (ref 11.5–15.5)
WBC: 10.7 10*3/uL — ABNORMAL HIGH (ref 4.0–10.5)
nRBC: 0 % (ref 0.0–0.2)

## 2019-12-30 LAB — PREPARE RBC (CROSSMATCH)

## 2019-12-30 LAB — POCT ACTIVATED CLOTTING TIME
Activated Clotting Time: 290 seconds
Activated Clotting Time: 472 seconds
Activated Clotting Time: 279 seconds
Activated Clotting Time: 235 seconds
Activated Clotting Time: 175 seconds

## 2019-12-30 LAB — APTT: aPTT: 30 seconds (ref 24–36)

## 2019-12-30 LAB — LIPID PANEL
Cholesterol: 137 mg/dL (ref 0–200)
HDL: 40 mg/dL — ABNORMAL LOW (ref >40)
LDL Cholesterol: 71 mg/dL (ref 0–99)
Total CHOL/HDL Ratio: 3.4 RATIO
Triglycerides: 132 mg/dL (ref <150)
VLDL: 26 mg/dL (ref 0–40)

## 2019-12-30 LAB — GLUCOSE, CAPILLARY
Glucose-Capillary: 242 mg/dL — ABNORMAL HIGH (ref 70–99)
Glucose-Capillary: 118 mg/dL — ABNORMAL HIGH (ref 70–99)

## 2019-12-30 LAB — SARS CORONAVIRUS 2 BY RT PCR (HOSPITAL ORDER, PERFORMED IN ~~LOC~~ HOSPITAL LAB): SARS Coronavirus 2: NEGATIVE

## 2019-12-30 LAB — ABO/RH: ABO/RH(D): A POS

## 2019-12-30 LAB — MRSA PCR SCREENING: MRSA by PCR: NEGATIVE

## 2019-12-30 LAB — HEMOGLOBIN A1C
Hgb A1c MFr Bld: 8.2 % — ABNORMAL HIGH (ref 4.8–5.6)
Mean Plasma Glucose: 188.64 mg/dL

## 2019-12-30 LAB — PROTIME-INR
INR: 1 (ref 0.8–1.2)
Prothrombin Time: 12.3 seconds (ref 11.4–15.2)

## 2019-12-30 LAB — TSH: TSH: 0.203 u[IU]/mL — ABNORMAL LOW (ref 0.350–4.500)

## 2019-12-30 SURGERY — CORONARY/GRAFT ACUTE MI REVASCULARIZATION
Anesthesia: LOCAL

## 2019-12-30 MED ORDER — SODIUM CHLORIDE 0.9 % IV SOLN: INTRAVENOUS | Status: AC

## 2019-12-30 MED ORDER — NITROGLYCERIN IN D5W 200-5 MCG/ML-% IV SOLN
INTRAVENOUS | Status: AC
  Filled 2019-12-30: qty 250

## 2019-12-30 MED ORDER — ONDANSETRON HCL 4 MG/2ML IJ SOLN: 4.0000 mg | Freq: Once | INTRAMUSCULAR | Status: DC

## 2019-12-30 MED ORDER — TICAGRELOR 90 MG PO TABS
90.0000 mg | ORAL_TABLET | Freq: Two times a day (BID) | ORAL | Status: DC
  Administered 2019-12-30 – 2020-01-01 (×4): 90 mg via ORAL
  Filled 2019-12-30 (×4): qty 1

## 2019-12-30 MED ORDER — ACETAMINOPHEN 325 MG PO TABS: 650.0000 mg | ORAL_TABLET | ORAL | Status: DC | PRN

## 2019-12-30 MED ORDER — NITROGLYCERIN 0.4 MG SL SUBL: 0.4000 mg | SUBLINGUAL_TABLET | SUBLINGUAL | Status: DC | PRN

## 2019-12-30 MED ORDER — NITROGLYCERIN 1 MG/10 ML FOR IR/CATH LAB
INTRA_ARTERIAL | Status: DC | PRN
  Administered 2019-12-30 (×2): 100 ug via INTRACORONARY

## 2019-12-30 MED ORDER — LIDOCAINE HCL (PF) 1 % IJ SOLN
INTRAMUSCULAR | Status: AC
  Filled 2019-12-30: qty 30

## 2019-12-30 MED ORDER — NITROGLYCERIN IN D5W 200-5 MCG/ML-% IV SOLN
0.0000 ug/min | INTRAVENOUS | Status: DC
  Administered 2019-12-30: 5 ug/min via INTRAVENOUS

## 2019-12-30 MED ORDER — HEPARIN SODIUM (PORCINE) 5000 UNIT/ML IJ SOLN
3500.0000 [IU] | Freq: Once | INTRAMUSCULAR | Status: AC
  Administered 2019-12-30: 3500 [IU] via INTRAVENOUS

## 2019-12-30 MED ORDER — IOHEXOL 350 MG/ML SOLN
INTRAVENOUS | Status: AC
  Filled 2019-12-30: qty 1

## 2019-12-30 MED ORDER — BIVALIRUDIN TRIFLUOROACETATE 250 MG IV SOLR
INTRAVENOUS | Status: AC
  Filled 2019-12-30: qty 250

## 2019-12-30 MED ORDER — HEPARIN (PORCINE) IN NACL 1000-0.9 UT/500ML-% IV SOLN
INTRAVENOUS | Status: DC | PRN
  Administered 2019-12-30 (×2): 500 mL

## 2019-12-30 MED ORDER — HYDRALAZINE HCL 20 MG/ML IJ SOLN: 10.0000 mg | INTRAMUSCULAR | Status: AC | PRN

## 2019-12-30 MED ORDER — BIVALIRUDIN BOLUS VIA INFUSION - CUPID
INTRAVENOUS | Status: DC | PRN
  Administered 2019-12-30: 42.525 mg via INTRAVENOUS

## 2019-12-30 MED ORDER — NOREPINEPHRINE 4 MG/250ML-% IV SOLN
2.0000 ug/min | INTRAVENOUS | Status: DC
  Administered 2019-12-30: 5 ug/min via INTRAVENOUS

## 2019-12-30 MED ORDER — ATORVASTATIN CALCIUM 80 MG PO TABS
80.0000 mg | ORAL_TABLET | Freq: Every day | ORAL | Status: DC
  Administered 2019-12-31 – 2020-01-01 (×2): 80 mg via ORAL
  Filled 2019-12-30 (×2): qty 1

## 2019-12-30 MED ORDER — ONDANSETRON HCL 4 MG/2ML IJ SOLN
INTRAMUSCULAR | Status: AC
  Administered 2019-12-30: 4 mg
  Filled 2019-12-30: qty 2

## 2019-12-30 MED ORDER — NOREPINEPHRINE 4 MG/250ML-% IV SOLN: 0.0000 ug/min | INTRAVENOUS | Status: DC

## 2019-12-30 MED ORDER — SODIUM CHLORIDE 0.9 % IV SOLN: 250.0000 mL | INTRAVENOUS | Status: DC

## 2019-12-30 MED ORDER — SODIUM CHLORIDE 0.9 % IV BOLUS
1000.0000 mL | Freq: Once | INTRAVENOUS | Status: AC
  Administered 2019-12-30: 1000 mL via INTRAVENOUS

## 2019-12-30 MED ORDER — SODIUM CHLORIDE 0.9 % IV SOLN: INTRAVENOUS | Status: DC

## 2019-12-30 MED ORDER — NOREPINEPHRINE 4 MG/250ML-% IV SOLN
INTRAVENOUS | Status: AC
  Filled 2019-12-30: qty 250

## 2019-12-30 MED ORDER — ASPIRIN 81 MG PO CHEW
324.0000 mg | CHEWABLE_TABLET | Freq: Once | ORAL | Status: AC
  Administered 2019-12-30: 324 mg via ORAL

## 2019-12-30 MED ORDER — TICAGRELOR 90 MG PO TABS
ORAL_TABLET | ORAL | Status: AC
  Filled 2019-12-30: qty 2

## 2019-12-30 MED ORDER — LIDOCAINE HCL (PF) 1 % IJ SOLN
INTRAMUSCULAR | Status: DC | PRN
  Administered 2019-12-30: 13 mL

## 2019-12-30 MED ORDER — DIAZEPAM 5 MG PO TABS: 5.0000 mg | ORAL_TABLET | ORAL | Status: DC | PRN

## 2019-12-30 MED ORDER — IOHEXOL 350 MG/ML SOLN
INTRAVENOUS | Status: DC | PRN
  Administered 2019-12-30: 140 mL

## 2019-12-30 MED ORDER — SODIUM CHLORIDE 0.9% FLUSH
3.0000 mL | Freq: Two times a day (BID) | INTRAVENOUS | Status: DC
  Administered 2019-12-30 – 2020-01-01 (×4): 3 mL via INTRAVENOUS

## 2019-12-30 MED ORDER — SODIUM CHLORIDE 0.9 % IV SOLN: 250.0000 mL | INTRAVENOUS | Status: DC | PRN

## 2019-12-30 MED ORDER — INSULIN ASPART 100 UNIT/ML ~~LOC~~ SOLN
0.0000 [IU] | Freq: Three times a day (TID) | SUBCUTANEOUS | Status: DC
  Administered 2019-12-30: 3 [IU] via SUBCUTANEOUS
  Administered 2019-12-31: 1 [IU] via SUBCUTANEOUS
  Administered 2019-12-31 – 2020-01-01 (×2): 5 [IU] via SUBCUTANEOUS
  Administered 2020-01-01: 2 [IU] via SUBCUTANEOUS

## 2019-12-30 MED ORDER — SODIUM CHLORIDE 0.9% IV SOLUTION: Freq: Once | INTRAVENOUS | Status: AC

## 2019-12-30 MED ORDER — SODIUM CHLORIDE 0.9% FLUSH: 3.0000 mL | Freq: Once | INTRAVENOUS | Status: DC

## 2019-12-30 MED ORDER — ASPIRIN 81 MG PO CHEW
81.0000 mg | CHEWABLE_TABLET | Freq: Every day | ORAL | Status: DC
  Administered 2019-12-31 – 2020-01-01 (×2): 81 mg via ORAL
  Filled 2019-12-30 (×2): qty 1

## 2019-12-30 MED ORDER — SODIUM CHLORIDE 0.9% FLUSH: 3.0000 mL | INTRAVENOUS | Status: DC | PRN

## 2019-12-30 MED ORDER — ONDANSETRON HCL 4 MG/2ML IJ SOLN: 4.0000 mg | Freq: Four times a day (QID) | INTRAMUSCULAR | Status: DC | PRN

## 2019-12-30 MED ORDER — FENTANYL CITRATE (PF) 100 MCG/2ML IJ SOLN
12.5000 ug | Freq: Once | INTRAMUSCULAR | Status: AC
  Administered 2019-12-30: 12.5 ug via INTRAVENOUS
  Filled 2019-12-30: qty 2

## 2019-12-30 MED ORDER — HEPARIN (PORCINE) IN NACL 1000-0.9 UT/500ML-% IV SOLN
INTRAVENOUS | Status: AC
  Filled 2019-12-30: qty 1000

## 2019-12-30 MED ORDER — SODIUM CHLORIDE 0.9 % IV SOLN
INTRAVENOUS | Status: AC | PRN
  Administered 2019-12-30 (×2): 1.75 mg/kg/h via INTRAVENOUS

## 2019-12-30 MED ORDER — TICAGRELOR 90 MG PO TABS
ORAL_TABLET | ORAL | Status: DC | PRN
  Administered 2019-12-30: 180 mg via ORAL

## 2019-12-30 MED ORDER — SODIUM CHLORIDE 0.9 % IV SOLN
INTRAVENOUS | Status: AC | PRN
  Administered 2019-12-30: 20 mL/h via INTRAVENOUS

## 2019-12-30 MED ORDER — LABETALOL HCL 5 MG/ML IV SOLN: 10.0000 mg | INTRAVENOUS | Status: AC | PRN

## 2019-12-30 MED ORDER — ORAL CARE MOUTH RINSE
15.0000 mL | Freq: Two times a day (BID) | OROMUCOSAL | Status: DC
  Administered 2019-12-31 – 2020-01-01 (×2): 15 mL via OROMUCOSAL

## 2019-12-30 MED FILL — Medication: Qty: 1 | Status: AC

## 2019-12-30 SURGICAL SUPPLY — 16 items
BALLN SAPPHIRE 2.0X12 (BALLOONS) ×2
BALLOON SAPPHIRE 2.0X12 (BALLOONS) ×1 IMPLANT
CATH INFINITI 5FR MULTPACK ANG (CATHETERS) ×2 IMPLANT
CATH LAUNCHER 6FR JR4 (CATHETERS) ×2 IMPLANT
GLIDESHEATH SLEND SS 6F .021 (SHEATH) IMPLANT
GUIDEWIRE INQWIRE 1.5J.035X260 (WIRE) IMPLANT
INQWIRE 1.5J .035X260CM (WIRE)
KIT ENCORE 26 ADVANTAGE (KITS) ×2 IMPLANT
KIT HEART LEFT (KITS) ×2 IMPLANT
PACK CARDIAC CATHETERIZATION (CUSTOM PROCEDURE TRAY) ×2 IMPLANT
SHEATH PINNACLE 6F 10CM (SHEATH) ×2 IMPLANT
TRANSDUCER W/STOPCOCK (MISCELLANEOUS) ×2 IMPLANT
TUBING CIL FLEX 10 FLL-RA (TUBING) ×2 IMPLANT
WIRE COUGAR XT STRL 190CM (WIRE) ×2 IMPLANT
WIRE EMERALD 3MM-J .035X150CM (WIRE) ×2 IMPLANT
WIRE PT2 MS 185 (WIRE) ×2 IMPLANT

## 2019-12-30 NOTE — ED Notes (Signed)
PT reports CP radiates to back

## 2019-12-30 NOTE — Progress Notes (Signed)
Chaplain responded to page and is available if needed. Rev. Tamsen Snider Pager [phone]

## 2019-12-30 NOTE — H&P (Addendum)
Cardiology Admission History and Physical:   Patient ID: Jessica [name] MRN: [id number]; DOB: [birth year]   Admission date: 12/30/2019  Primary Care Provider: Marton Redwood, MD [facility] Cardiologist: Shelva Majestic, MD - new [facility] Electrophysiologist:  None   Chief Complaint:  Chest pain  Patient Profile:   Jessica [name] is a 69 y.o. adult with HTN, HLD, hiatal hernia, GERD, DM, congenital blindness, CKD stage III per notes, anemia, anxiety, osteoporosis, SVT  History of Present Illness:   Patient denies prior cardiac history although SVT is listed in chart. She reports h/o HTN, HLD, DM, other history as previously entered in [facility]. She reports she's been more SOB the last few days with exertion. She also has occasional nonproductive cough. This morning she developed chest pain radiating to her back around 7am a/w mild nausea and SOB. She also started coughing this morning. She presented to ED for evaluation and EKG showed NSR with low voltage tracing and concern for subtle ST elevation in II, III, avF, V5-V6. She received 324mg  ASA, 3500 heparin, and NTG drip. Code STEMI was called and she was taken emergently to the cath lab. Labs pending, mild leukocytosis noted and normal Hgb. Mother and father had heart disease, but patient does not recall details. She has not had Covid vaccine. Afebrile on arrival with normal POx.   Past Medical History:  Diagnosis Date  . Anemia   . Anxiety   . CKD (chronic kidney disease)    CKD III per PCP notes-pt denies  . Congenital blindness   . Diabetes mellitus   . Diverticula of small intestine   . Esophageal stricture   . GERD (gastroesophageal reflux disease)   . H/O hiatal hernia   . Hyperlipidemia   . Hypertension   . Hypothyroidism   . Iron deficiency anemia   . Low magnesium level   . Low sodium levels   . Osteopenia   . Osteoporosis   . Postoperative nausea and vomiting   . Renal disease   . Retinitis   . SVT  (supraventricular tachycardia) ([place])   . Vitamin B 12 deficiency     Past Surgical History:  Procedure Laterality Date  . BACK SURGERY    . CARPAL TUNNEL RELEASE     Right   . CESAREAN SECTION    . CHOLECYSTECTOMY  1985  . COLONOSCOPY    . HERNIA REPAIR    . Right knee arthroscopy  1993  . Right thyroidectomy  2007  . TOTAL ABDOMINAL HYSTERECTOMY W/ BILATERAL SALPINGOOPHORECTOMY  1993  . UPPER GASTROINTESTINAL ENDOSCOPY       Medications Prior to Admission: Prior to Admission medications   Medication Sig Start Date End Date Taking? Authorizing Provider  amLODipine (NORVASC) 5 MG tablet Take 5 mg by mouth daily.    [provider]  carvedilol (COREG) 25 MG tablet Take 25 mg by mouth 2 (two) times daily with a meal.    [provider]  Coenzyme Q10 (CO Q 10 PO) Take 1 tablet by mouth daily.    [provider]  famotidine (PEPCID) 20 MG tablet Take 20 mg by mouth 2 (two) times daily.    [provider]  hydrALAZINE (APRESOLINE) 25 MG tablet Take 1 tablet by mouth 3 (three) times daily. 11/09/17   [provider]  levothyroxine (SYNTHROID, LEVOTHROID) 50 MCG tablet Take 75 mcg by mouth daily.     [provider]  LORazepam (ATIVAN) 0.5 MG tablet Take 0.5 mg by  mouth daily.    [provider]  losartan (COZAAR) 100 MG tablet Take 100 mg by mouth daily. 09/20/17   [provider]  Magnesium 250 MG TABS Take 1 tablet by mouth daily.    [provider]  metFORMIN (GLUCOPHAGE) 1000 MG tablet Take 1,000 mg by mouth 2 (two) times daily with a meal.    [provider]  omeprazole (PRILOSEC) 40 MG capsule Take 40 mg by mouth 2 (two) times a day.    [provider]  promethazine (PHENERGAN) 25 MG tablet Take 25 mg by mouth every 8 (eight) hours as needed for nausea.    [provider]  psyllium (METAMUCIL) 58.6 % packet Take 1 packet by mouth daily.    [provider]  rosuvastatin  (CRESTOR) 20 MG tablet Take 1 tablet by mouth daily.    [provider]  sertraline (ZOLOFT) 50 MG tablet daily. 11/22/18   [provider]     Allergies:    Allergies  Allergen Reactions  . Codeine Shortness Of Breath and Nausea And Vomiting  . Erythromycin Nausea And Vomiting  . Levaquin [Levofloxacin] Shortness Of Breath    Social History:   Social History   Socioeconomic History  . Marital status: Married    Spouse name: Not on file  . Number of children: 4  . Years of education: Not on file  . Highest education level: Not on file  Occupational History  . Occupation: retired    Fish farm manager: [employer]  Tobacco Use  . Smoking status: Never Smoker  . Smokeless tobacco: Never Used  Substance and Sexual Activity  . Alcohol use: No  . Drug use: No  . Sexual activity: Never  Other Topics Concern  . Not on file  Social History Narrative  . Not on file   Social Determinants of Health   Financial Resource Strain:   . Difficulty of Paying Living Expenses:   Food Insecurity:   . Worried About Charity fundraiser in the Last Year:   . Arboriculturist in the Last Year:   Transportation Needs:   . Film/video editor (Medical):   Marland Kitchen Lack of Transportation (Non-Medical):   Physical Activity:   . Days of Exercise per Week:   . Minutes of Exercise per Session:   Stress:   . Feeling of Stress :   Social Connections:   . Frequency of Communication with Friends and Family:   . Frequency of Social Gatherings with Friends and Family:   . Attends Religious Services:   . Active Member of Clubs or Organizations:   . Attends Archivist Meetings:   Marland Kitchen Marital Status:   Intimate Partner Violence:   . Fear of Current or Ex-Partner:   . Emotionally Abused:   Marland Kitchen Physically Abused:   . Sexually Abused:     Family History:   The patient's family history includes COPD in his father; CVA in his mother; Coronary artery disease in his father;  Diabetes Mellitus II in his maternal grandmother and mother; Heart disease in his mother; Hyperlipidemia in his sister; Hypertension in his sister and sister; Uterine cancer in his sister. There is no history of Colon cancer, Rectal cancer, Stomach cancer, or Esophageal cancer.    ROS:  Please see the history of present illness. No fevers or chills. All other ROS reviewed and negative.     Physical Exam/Data:   Vitals:   12/30/19 1121 12/30/19 1122 12/30/19  1130 12/30/19 1206  BP: (!) 167/81     Pulse:  81    Resp: (!) 35 19    Temp:      TempSrc:      SpO2:  99%  100%  Weight:   56.7 kg   Height:   5' 1.5" (1.562 m)    No intake or output data in the 24 hours ending 12/30/19 1227 Last 3 Weights 12/30/2019 02/07/2019 01/25/2019  Weight (lbs) 125 lb 125 lb 125 lb  Weight (kg) 56.7 kg 56.7 kg 56.7 kg     Body mass index is 23.24 kg/m.  General: Frail appearing elderly WF in no acute distress. Head: Normocephalic, atraumatic, sclera clear but cornea are blurry, no xanthomas, nares are without discharge. Neck: Negative for carotid bruits. JVP not elevated. Lungs: Clear bilaterally to auscultation without wheezes, rales, or rhonchi. Breathing is unlabored. Heart: RRR S1 S2 without murmurs, rubs, or gallops.  Abdomen: Soft, non-tender, non-distended with normoactive bowel sounds. No rebound/guarding. Extremities: No clubbing or cyanosis. No edema. Distal pedal pulses are 2+ and equal bilaterally. Neuro: Alert and oriented X 3. Moves all extremities spontaneously. Psych:  Responds to questions appropriately with a normal affect.  EKG:  The ECG that was done today was personally reviewed and demonstrates NSR 68bpm, low voltage, subtle ST elevation II, II, avF, V5-V6, downsloped TWI avL, nonspecific STT change in V2  Relevant CV Studies: N/a  Laboratory Data:  Hematology Recent Labs  Lab 12/30/19 1112  WBC 10.7*  RBC 4.02  HGB 12.5  HCT 39.0  MCV 97.0  MCH 31.1  MCHC 32.1  RDW  12.7  PLT 298    Radiology/Studies:  DG Chest Portable 1 View  Result Date: 12/30/2019 CLINICAL DATA:  Onset of coughing this morning, central chest pain, RIGHT chest pain radiating across back, shortness of breath, STEMI EXAM: PORTABLE CHEST 1 VIEW COMPARISON:  Portable exam 1149 hours compared to 09/21/2017 FINDINGS: Normal heart size, mediastinal contours, and pulmonary vascularity. Minimal atelectasis or infiltrate at LEFT base. Lungs otherwise clear. No pulmonary infiltrate, pleural effusion or pneumothorax. Surgical clips RIGHT cervical region question prior thyroid surgery. Bones demineralized. IMPRESSION: Minimal LEFT basilar atelectasis versus infiltrate. Electronically Signed   By: Lavonia Dana M.D.   On: 12/30/2019 12:03       TIMI Risk Score for ST  Elevation MI:   The patient's TIMI risk score is 7, which indicates a 23.4% risk of all cause mortality at 30 days.       Assessment and Plan:   1. Chest pain/dyspnea concerning for STEMI - patient was taken urgently to the cath lab - further recs pending outcome of cath.  2. Cough - CXR with minimal left basilar atelectasis versus infiltrate. Dr. Claiborne Billings to review post-cath. Follow WBC. Afebrile. Covid test pending. Normal O2 sat.  3. Essential HTN - mildly HTNive in ED, follow post-cath.  4. Hyperlipidemia - anticipate continuing statin.  5. CKD stage III - kidney function pending.  6. DM - previous home list included Metformin, await pharmacy to clarify. Anticipate holding with SSI.  Severity of Illness: The appropriate patient status for this patient is INPATIENT. Inpatient status is judged to be reasonable and necessary in order to provide the required intensity of service to ensure the patient's safety. The patient's presenting symptoms, physical exam findings, and initial radiographic and laboratory data in the context of their chronic comorbidities is felt to place them at high risk for further clinical deterioration.  Furthermore, it is  not anticipated that the patient will be medically stable for discharge from the hospital within 2 midnights of admission. The following factors support the patient status of inpatient.   " The patient's presenting symptoms include chest pain . " The worrisome physical exam findings include indicating chest pain. " The initial radiographic and laboratory data are worrisome because of ST elevation on EKG concerning for STEMI. " The chronic co-morbidities include DM, HTN, HLD, CKD.   * I certify that at the point of admission it is my clinical judgment that the patient will require inpatient hospital care spanning beyond 2 midnights from the point of admission due to high intensity of service, high risk for further deterioration and high frequency of surveillance required.*    For questions or updates, please contact [place] Please consult [email or web address] for contact info under     Signed, Charlie Pitter, PA-C  12/30/2019 12:27 PM     Patient seen and examined. Agree with assessment and plan.  Jessica [name] is a 69 year old female who has a history of diabetes mellitus and hypertension.  She has noticed more shortness of breath over the last several days.  This morning at approximately 7 AM she started to develop substernal chest tightness with pain radiating to her back.  Her chest pain persisted for several hours leading to her emergency room presentation.  In the emergency room her initial ECG showed early ST segment elevation in inferiorly.  A subsequent ECG showed slight progression in the patient complained of chest pain getting worse.  In the emergency room she received 4000 units of heparin and I recommended initiation of intravenous nitroglycerin.  Due to ongoing chest pain she is brought emergently to the cardiac catheterization laboratory.  I discussed with the patient in the emergency room my concern for blockage in the distribution of the RCA.  I discussed the  emergent catheterization procedure with her and she agreed for this to be done.   Troy Sine, MD, [facility] 12/30/2019 2:01 PM

## 2019-12-30 NOTE — ED Notes (Signed)
Blue top also collected and sent to lab.

## 2019-12-30 NOTE — ED Triage Notes (Signed)
Pt states she started coughing this morning while getting ready for church and then had pain to center of chest and R chest that radiates across back with SOB.  Denies nausea and vomiting.

## 2019-12-30 NOTE — Progress Notes (Signed)
See Dr. Marlou Porch' note re: hematoma, hypotension, ABL anemia. Awaiting pharmacy assistance with home med reconciliation - not yet complete. Asked pharmacy to notify me when complete, and if after 5pm, to page on-call fellow for review. Anticipate holding antihypertensives per d/w Dr. Marlou Porch. Did note Dr. Claiborne Billings has started atorvastatin, so this will take over for rosuvastatin if she was still on that. SSI ordered. Ronella Plunk PA-C

## 2019-12-30 NOTE — Progress Notes (Addendum)
Came back from lunch ~1420 to pt status change.  Per covering RN, neurovascular check revealed very large hematoma at sheath/vascular site with continued oozing, uncontrolled.  Pressure held for 20 min w/ minimal relief.  Pt then became nauseas, vomited, BP dropped to low of 65/44, HR dropped to low of 38.  CCM MD called to bedside. Pt given zofran, fluid bolus, and levo gtt started. HR and BP corrected, fem-stop pressure device applied on vascular site, Cardiology called and updated,  Now at bedside.

## 2019-12-30 NOTE — Progress Notes (Signed)
Pts ACTs this shift;   1535: 290 1600: 391 7921: 783 7542: 175  Will relay to night shift RN, sheath may be pulled

## 2019-12-30 NOTE — ED Provider Notes (Signed)
[facility] EMERGENCY DEPARTMENT Provider Note   CSN: [id number] Arrival date & time: 12/30/19  1046  LEVEL 5 CAVEAT - ACUITY OF CONDITION History Chief Complaint  Patient presents with  . Chest Pain/STEMI    Jessica [name] is a 69 y.o. adult.  HPI 69 year old female presents with chest pain and back pain.  Started around 7 AM.  Feels like a pressure.  She also feels short of breath.  Has never had this before.  The chest pain seems to be coming and going and right now is gone.  However she still hurting in her back and is short of breath.  Has never had this before.  Denies any significant past coronary history.   Past Medical History:  Diagnosis Date  . Anemia   . Anxiety   . CKD (chronic kidney disease)    CKD III per PCP notes-pt denies  . Congenital blindness   . Diabetes mellitus   . Diverticula of small intestine   . Esophageal stricture   . GERD (gastroesophageal reflux disease)   . H/O hiatal hernia   . Hyperlipidemia   . Hypertension   . Hypothyroidism   . Iron deficiency anemia   . Low magnesium level   . Low sodium levels   . Osteopenia   . Osteoporosis   . Postoperative nausea and vomiting   . Renal disease   . Retinitis   . SVT (supraventricular tachycardia) ([place])   . Vitamin B 12 deficiency     Patient Active Problem List   Diagnosis Date Noted  . Dysphagia 12/06/2017  . History of esophageal stricture 12/06/2017  . Nausea with vomiting 05/21/2013  . HYPOTHYROIDISM 11/15/2007  . DIABETES MELLITUS, TYPE II 11/15/2007  . DYSLIPIDEMIA 11/15/2007  . VISUAL IMPAIRMENT 11/15/2007  . ESOPHAGEAL STRICTURE 02/08/2007  . GASTROESOPHAGEAL REFLUX DISEASE 02/08/2007  . HIATAL HERNIA 02/08/2007    Past Surgical History:  Procedure Laterality Date  . BACK SURGERY    . CARPAL TUNNEL RELEASE     Right   . CESAREAN SECTION    . CHOLECYSTECTOMY  1985  . COLONOSCOPY    . HERNIA REPAIR    . Right knee arthroscopy  1993  . Right  thyroidectomy  2007  . TOTAL ABDOMINAL HYSTERECTOMY W/ BILATERAL SALPINGOOPHORECTOMY  1993  . UPPER GASTROINTESTINAL ENDOSCOPY       OB History   No obstetric history on file.     Family History  Problem Relation Age of Onset  . Coronary artery disease Father   . COPD Father   . CVA Mother   . Diabetes Mellitus II Mother   . Diabetes Mellitus II Maternal Grandmother   . Uterine cancer Sister   . Hypertension Sister   . Hypertension Sister        Brother also  . Hyperlipidemia Sister        Brother also  . Colon cancer Neg Hx   . Rectal cancer Neg Hx   . Stomach cancer Neg Hx   . Esophageal cancer Neg Hx     Social History   Tobacco Use  . Smoking status: Never Smoker  . Smokeless tobacco: Never Used  Substance Use Topics  . Alcohol use: No  . Drug use: No    Home Medications Prior to Admission medications   Medication Sig Start Date End Date Taking? Authorizing Provider  amLODipine (NORVASC) 5 MG tablet Take 5 mg by mouth daily.    [provider]  carvedilol (  COREG) 25 MG tablet Take 25 mg by mouth 2 (two) times daily with a meal.    [provider]  Coenzyme Q10 (CO Q 10 PO) Take 1 tablet by mouth daily.    [provider]  famotidine (PEPCID) 20 MG tablet Take 20 mg by mouth 2 (two) times daily.    [provider]  hydrALAZINE (APRESOLINE) 25 MG tablet Take 1 tablet by mouth 3 (three) times daily. 11/09/17   [provider]  levothyroxine (SYNTHROID, LEVOTHROID) 50 MCG tablet Take 75 mcg by mouth daily.     [provider]  LORazepam (ATIVAN) 0.5 MG tablet Take 0.5 mg by mouth daily.    [provider]  losartan (COZAAR) 100 MG tablet Take 100 mg by mouth daily. 09/20/17   [provider]  Magnesium 250 MG TABS Take 1 tablet by mouth daily.    [provider]  metFORMIN (GLUCOPHAGE) 1000 MG tablet Take 1,000 mg by mouth 2 (two) times daily with a meal.    [provider]    omeprazole (PRILOSEC) 40 MG capsule Take 40 mg by mouth 2 (two) times a day.    [provider]  promethazine (PHENERGAN) 25 MG tablet Take 25 mg by mouth every 8 (eight) hours as needed for nausea.    [provider]  psyllium (METAMUCIL) 58.6 % packet Take 1 packet by mouth daily.    [provider]  rosuvastatin (CRESTOR) 20 MG tablet Take 1 tablet by mouth daily.    [provider]  sertraline (ZOLOFT) 50 MG tablet daily. 11/22/18   [provider]    Allergies    Codeine, Erythromycin, and Levaquin [levofloxacin]  Review of Systems   Review of Systems  Unable to perform ROS: Acuity of condition    Physical Exam Updated Vital Signs BP (!) 167/81   Pulse 81   Temp 98.1 F (36.7 C) (Oral)   Resp 19   Ht 5' 1.5" (1.562 m)   Wt 56.7 kg   SpO2 100%   BMI 23.24 kg/m   Physical Exam Vitals and nursing note reviewed.  Constitutional:      General: He is not in acute distress.    Appearance: He is well-developed. He is not ill-appearing or diaphoretic.  HENT:     Head: Normocephalic and atraumatic.     Right Ear: External ear normal.     Left Ear: External ear normal.     Nose: Nose normal.  Eyes:     General:        Right eye: No discharge.        Left eye: No discharge.  Cardiovascular:     Rate and Rhythm: Normal rate and regular rhythm.     Pulses:          Radial pulses are 2+ on the right side and 2+ on the left side.     Heart sounds: Normal heart sounds.  Pulmonary:     Effort: Pulmonary effort is normal.     Breath sounds: Normal breath sounds.  Abdominal:     Palpations: Abdomen is soft.     Tenderness: There is no abdominal tenderness.  Skin:    General: Skin is warm and dry.  Neurological:     Mental Status: He is alert.  Psychiatric:        Mood and Affect: Mood is not anxious.     ED Results / Procedures / Treatments   Labs (all labs ordered  are listed, but only abnormal results are displayed) Labs  Reviewed  CBC - Abnormal; Notable for the following components:      Result Value   WBC 10.7 (*)    All other components within normal limits  SARS CORONAVIRUS 2 BY RT PCR (HOSPITAL ORDER, [facility])  PROTIME-INR  APTT  BASIC METABOLIC PANEL  HEMOGLOBIN A1C  LIPID PANEL  TROPONIN I (HIGH SENSITIVITY)    EKG EKG Interpretation  Date/Time:  Sunday December 30 2019 11:33:34 EDT Ventricular Rate:  77 PR Interval:  180 QRS Duration: 83 QT Interval:  368 QTC Calculation: 417 R Axis:   44 Text Interpretation: Sinus rhythm Inferoposterior infarct, acute (LCx) >>> Acute MI <<< ST elevations worsening compared ot earlier Confirmed by Sherwood Gambler [phone]) on 12/30/2019 11:42:21 AM   Radiology DG Chest Portable 1 View  Result Date: 12/30/2019 CLINICAL DATA:  Onset of coughing this morning, central chest pain, RIGHT chest pain radiating across back, shortness of breath, STEMI EXAM: PORTABLE CHEST 1 VIEW COMPARISON:  Portable exam 1149 hours compared to 09/21/2017 FINDINGS: Normal heart size, mediastinal contours, and pulmonary vascularity. Minimal atelectasis or infiltrate at LEFT base. Lungs otherwise clear. No pulmonary infiltrate, pleural effusion or pneumothorax. Surgical clips RIGHT cervical region question prior thyroid surgery. Bones demineralized. IMPRESSION: Minimal LEFT basilar atelectasis versus infiltrate. Electronically Signed   By: Lavonia Dana M.D.   On: 12/30/2019 12:03    Procedures .Critical Care Performed by: Sherwood Gambler, MD Authorized by: Sherwood Gambler, MD   Critical care provider statement:    Critical care time (minutes):  35   Critical care was time spent personally by me on the following activities:  Discussions with consultants, evaluation of patient's response to treatment, examination of patient, ordering and performing treatments and interventions, ordering and review of laboratory studies, ordering and review of radiographic  studies, pulse oximetry, re-evaluation of patient's condition, obtaining history from patient or surrogate and review of old charts   (including critical care time)  Medications Ordered in ED Medications  sodium chloride flush (NS) 0.9 % injection 3 mL ( Intravenous MAR Hold 12/30/19 1158)  0.9 %  sodium chloride infusion (has no administration in time range)  nitroGLYCERIN 50 mg in dextrose 5 % 250 mL (0.2 mg/mL) infusion (5 mcg/min Intravenous New Bag/Given 12/30/19 1146)  nitroGLYCERIN 0.2 mg/mL in dextrose 5 % infusion (has no administration in time range)  0.9 %  sodium chloride infusion (20 mL/hr Intravenous New Bag/Given 12/30/19 1207)  Heparin (Porcine) in NaCl 1000-0.9 UT/500ML-% SOLN (500 mLs  Given 12/30/19 1208)  lidocaine (PF) (XYLOCAINE) 1 % injection (13 mLs  Given 12/30/19 1212)  aspirin chewable tablet 324 mg (324 mg Oral Given 12/30/19 1126)  heparin injection 3,500 Units (3,500 Units Intravenous Given 12/30/19 1126)    ED Course  I have reviewed the triage vital signs and the nursing notes.  Pertinent labs & imaging results that were available during my care of the patient were reviewed by me and considered in my medical decision making (see chart for details).    MDM Rules/Calculators/A&P                      Patient's presentation and ECG is consistent with STEMI.  Discussed with Dr. Claiborne Billings who evaluated at the bedside and repeat ECG looks more classic.  She was given aspirin, heparin, and started on nitroglycerin drip per his recommendations.  Taken emergently to the Cath Lab. Final Clinical Impression(s) /  ED Diagnoses Final diagnoses:  ST elevation myocardial infarction (STEMI), unspecified artery [facility])    Rx / DC Orders ED Discharge Orders    None       Sherwood Gambler, MD 12/30/19 1216

## 2019-12-30 NOTE — Progress Notes (Signed)
   Responded to nurse call regarding hematoma development and bleeding surrounding sheath site in patient with recent STEMI, with balloon angioplasty only to a posterior lateral segment.  During inspection, suture was intact however close to the skin site, there did appear to be a kink in the exposed portion of sheath.  This was originally dampening the waveform arterially however upon mild correction, waveform returned.  We were unable to draw blood from the sheath.  Given the fact that sheath was still producing a waveform, we kept it in place.  ACT was in the 430 range.  Bivalirudin was stopped approximately 10 minutes prior to this.  She did vagal with hypotension nausea.  Quickly rebounded.  Hematoma surrounding sheath is now soft however there is still some leaking that is being controlled with FemoStop.  Her stat hemoglobin was 9.8 down from 12.9.  Still appears pale.  I decided to go ahead and transfuse 2 units to stay ahead.  She is on low-dose norepinephrine.  Prior to blood, we are giving her a 500 mL bolus of IV normal saline.  LVEDP was 22.  I discussed with nursing team.  Adjusted FemoStop.  Held pressure as well.  Discussed with patient.  We will continue to hold home blood pressure medications.  Discussed with Dr. Claiborne Billings.  CRITICAL CARE Performed by: Candee Furbish   Total critical care time: 40 minutes  Critical care time was exclusive of separately billable procedures and treating other patients.  Critical care was necessary to treat or prevent imminent or life-threatening deterioration.  Critical care was time spent personally by me on the following activities: development of treatment plan with patient and/or surrogate as well as nursing, discussions with consultants, evaluation of patient's response to treatment, examination of patient, obtaining history from patient or surrogate, ordering and performing treatments and interventions, ordering and review of laboratory studies,  ordering and review of radiographic studies, pulse oximetry and re-evaluation of patient's condition.     Candee Furbish, MD

## 2019-12-31 ENCOUNTER — Inpatient Hospital Stay (HOSPITAL_COMMUNITY): Payer: Medicare HMO

## 2019-12-31 DIAGNOSIS — R079 Chest pain, unspecified: Secondary | ICD-10-CM

## 2019-12-31 DIAGNOSIS — E785 Hyperlipidemia, unspecified: Secondary | ICD-10-CM

## 2019-12-31 DIAGNOSIS — I2119 ST elevation (STEMI) myocardial infarction involving other coronary artery of inferior wall: Secondary | ICD-10-CM

## 2019-12-31 DIAGNOSIS — I10 Essential (primary) hypertension: Secondary | ICD-10-CM

## 2019-12-31 LAB — BASIC METABOLIC PANEL
Anion gap: 10 (ref 5–15)
BUN: 29 mg/dL — ABNORMAL HIGH (ref 8–23)
CO2: 19 mmol/L — ABNORMAL LOW (ref 22–32)
Calcium: 8.6 mg/dL — ABNORMAL LOW (ref 8.9–10.3)
Chloride: 107 mmol/L (ref 98–111)
Creatinine, Ser: 1.6 mg/dL — ABNORMAL HIGH (ref 0.44–1.00)
GFR calc Af Amer: 37 mL/min — ABNORMAL LOW (ref >60)
GFR calc non Af Amer: 32 mL/min — ABNORMAL LOW (ref >60)
Glucose, Bld: 112 mg/dL — ABNORMAL HIGH (ref 70–99)
Potassium: 4.6 mmol/L (ref 3.5–5.1)
Sodium: 136 mmol/L (ref 135–145)

## 2019-12-31 LAB — CBC
HCT: 35.2 % — ABNORMAL LOW (ref 36.0–46.0)
Hemoglobin: 11.6 g/dL — ABNORMAL LOW (ref 12.0–15.0)
MCH: 30.4 pg (ref 26.0–34.0)
MCHC: 33 g/dL (ref 30.0–36.0)
MCV: 92.4 fL (ref 80.0–100.0)
Platelets: 246 10*3/uL (ref 150–400)
RBC: 3.81 MIL/uL — ABNORMAL LOW (ref 3.87–5.11)
RDW: 14.8 % (ref 11.5–15.5)
WBC: 10.6 10*3/uL — ABNORMAL HIGH (ref 4.0–10.5)
nRBC: 0 % (ref 0.0–0.2)

## 2019-12-31 LAB — LIPID PANEL
Cholesterol: 116 mg/dL (ref 0–200)
HDL: 26 mg/dL — ABNORMAL LOW (ref >40)
LDL Cholesterol: 47 mg/dL (ref 0–99)
Total CHOL/HDL Ratio: 4.5 RATIO
Triglycerides: 216 mg/dL — ABNORMAL HIGH (ref <150)
VLDL: 43 mg/dL — ABNORMAL HIGH (ref 0–40)

## 2019-12-31 LAB — ECHOCARDIOGRAM COMPLETE
Height: 61.5 in
Weight: 2116.42 oz

## 2019-12-31 LAB — GLUCOSE, CAPILLARY
Glucose-Capillary: 98 mg/dL (ref 70–99)
Glucose-Capillary: 270 mg/dL — ABNORMAL HIGH (ref 70–99)
Glucose-Capillary: 146 mg/dL — ABNORMAL HIGH (ref 70–99)
Glucose-Capillary: 133 mg/dL — ABNORMAL HIGH (ref 70–99)
Glucose-Capillary: 155 mg/dL — ABNORMAL HIGH (ref 70–99)

## 2019-12-31 LAB — TYPE AND SCREEN
ABO/RH(D): A POS
Antibody Screen: NEGATIVE
Unit division: 0

## 2019-12-31 LAB — BPAM RBC
Blood Product Expiration Date: 202106242359
ISSUE DATE / TIME: 202106061639
Unit Type and Rh: 6200

## 2019-12-31 LAB — POCT ACTIVATED CLOTTING TIME: Activated Clotting Time: 505 seconds

## 2019-12-31 MED ORDER — SERTRALINE HCL 25 MG PO TABS
50.0000 mg | ORAL_TABLET | Freq: Every day | ORAL | Status: DC
  Administered 2019-12-31: 50 mg via ORAL
  Filled 2019-12-31: qty 2

## 2019-12-31 MED ORDER — CO Q 10 100 MG PO CAPS: 100.0000 mg | ORAL_CAPSULE | Freq: Every day | ORAL | Status: DC

## 2019-12-31 MED ORDER — CHLORHEXIDINE GLUCONATE CLOTH 2 % EX PADS: 6.0000 | MEDICATED_PAD | Freq: Every day | CUTANEOUS | Status: DC

## 2019-12-31 MED ORDER — LEVOTHYROXINE SODIUM 75 MCG PO TABS
75.0000 ug | ORAL_TABLET | Freq: Every day | ORAL | Status: DC
  Administered 2019-12-31 – 2020-01-01 (×2): 75 ug via ORAL
  Filled 2019-12-31 (×3): qty 1

## 2019-12-31 MED ORDER — FUROSEMIDE 10 MG/ML IJ SOLN
80.0000 mg | Freq: Once | INTRAMUSCULAR | Status: AC
  Administered 2019-12-31: 80 mg via INTRAVENOUS
  Filled 2019-12-31: qty 8

## 2019-12-31 MED ORDER — MAGNESIUM OXIDE 400 (241.3 MG) MG PO TABS
400.0000 mg | ORAL_TABLET | Freq: Every day | ORAL | Status: DC
  Administered 2019-12-31 – 2020-01-01 (×2): 400 mg via ORAL
  Filled 2019-12-31 (×4): qty 1

## 2019-12-31 MED ORDER — FAMOTIDINE 20 MG PO TABS
20.0000 mg | ORAL_TABLET | Freq: Two times a day (BID) | ORAL | Status: DC
  Administered 2019-12-31 – 2020-01-01 (×3): 20 mg via ORAL
  Filled 2019-12-31 (×3): qty 1

## 2019-12-31 MED ORDER — PANTOPRAZOLE SODIUM 40 MG PO TBEC
40.0000 mg | DELAYED_RELEASE_TABLET | Freq: Every day | ORAL | Status: DC
  Administered 2019-12-31 – 2020-01-01 (×2): 40 mg via ORAL
  Filled 2019-12-31 (×2): qty 1

## 2019-12-31 NOTE — Progress Notes (Signed)
[id number] MI education completed with pt who voiced understanding. Stressed importance of brilinta use. Reviewed NTG use, watching carbs, walking for ex, MI restrictions, and CRP 2. Referred to [place] CRP 2. Will return tomorrow to see if pt or family has additional questions. Graylon Good RN BSN 12/31/2019 2:24 PM  hc

## 2019-12-31 NOTE — Progress Notes (Signed)
Site Area: Right Groin  Site prior to removal: Level 2 soft   Manual Pressure applied for: 20 Minutes beginning at 1950 ending at 2010   Patient Status During Pull: VSS throughout, vascular assessment stable throughout  Post Pull Groin Site: Level 2. Soft   Post Pull Instructions Given: Yes, pt educated on bedrest and need to keep RLE straight and flat. Pt also educated on the need to call for help if any warmness or wetness felt near site and to hold pressure during sneezing, coughing.   Post Pull Pulses Present: R Fem 3+ Strong, R Dorsalis Pedal +2 Moderate  Dressing Applied: Gauze dressing applied, clean, dry and intact.     Rexanne Mano, RN

## 2019-12-31 NOTE — Progress Notes (Signed)
CARDIAC REHAB PHASE I   PRE:  Rate/Rhythm: 81 SR  BP:  Supine:   Sitting: 124/74  Standing:    SaO2: 99%RA  MODE:  Ambulation: 370 ft   POST:  Rate/Rhythm: 89  BP:  Supine:   Sitting: 140/68  Standing:    SaO2: 99%RA 1100-1138 Pt walked 370 ft on RA with asst x 2 for hand held assistance. Pt tolerated well with no CP. Set up lunch for pt. Call light within reach.   Graylon Good, RN BSN  12/31/2019 11:35 AM

## 2019-12-31 NOTE — Progress Notes (Signed)
  Echocardiogram 2D Echocardiogram has been performed.  Jessica [name] G Malacai Grantz 12/31/2019, 9:29 AM

## 2019-12-31 NOTE — Progress Notes (Addendum)
Progress Note  Patient Name: Jessica [name] Date of Encounter: 12/31/2019  [facility] Cardiologist: Shelva Majestic, MD   Subjective   Patient feels great   No CP   No SOB   Inpatient Medications    Scheduled Meds: . aspirin  81 mg Oral Daily  . atorvastatin  80 mg Oral Daily  . Chlorhexidine Gluconate Cloth  6 each Topical Daily  . insulin aspart  0-9 Units Subcutaneous TID WC  . mouth rinse  15 mL Mouth Rinse BID  . ondansetron (ZOFRAN) IV  4 mg Intravenous Once  . sodium chloride flush  3 mL Intravenous Q12H  . ticagrelor  90 mg Oral BID   Continuous Infusions: . sodium chloride    . sodium chloride    . norepinephrine (LEVOPHED) Adult infusion Stopped (12/30/19 1659)   PRN Meds: sodium chloride, acetaminophen, diazepam, nitroGLYCERIN, ondansetron (ZOFRAN) IV, sodium chloride flush   Vital Signs    Vitals:   12/31/19 0600 12/31/19 0630 12/31/19 0700 12/31/19 0800  BP: 119/68 126/73 112/75 112/62  Pulse: 78 77 87 80  Resp: (!) 24 18 16 15   Temp:      TempSrc:      SpO2: 91% 95% 92% 94%  Weight: 60 kg     Height:        Intake/Output Summary (Last 24 hours) at 12/31/2019 0924 Last data filed at 12/31/2019 0800 Gross per 24 hour  Intake 1993.96 ml  Output 1350 ml  Net 643.96 ml   Last 3 Weights 12/31/2019 12/30/2019 02/07/2019  Weight (lbs) 132 lb 4.4 oz 125 lb 125 lb  Weight (kg) 60 kg 56.7 kg 56.7 kg      Telemetry    SR  - Personally Reviewed  ECG    None reviewed today  - Personally Reviewed  Physical Exam    GEN: No acute distress.   Neck: No JVD Cardiac: RRR, no murmurs, rubs, or gallops.  Respiratory: Clear to auscultation bilaterally. GI: Soft, nontender, non-distended  MS: No LE  edema;   Labs    High Sensitivity Troponin:   Recent Labs  Lab 12/30/19 1112 12/30/19 1352 12/30/19 1527 12/30/19 2250  TROPONINIHS 1,089* 11,353* 16,469* 13,084*      Chemistry Recent Labs  Lab 12/30/19 1112 12/30/19 1527 12/31/19 0325  NA 131*   --  136  K 5.0  --  4.6  CL 101  --  107  CO2 22  --  19*  GLUCOSE 203*  --  112*  BUN 33*  --  29*  CREATININE 1.82*  --  1.60*  CALCIUM 9.4  --  8.6*  PROT  --  6.4*  --   ALBUMIN  --  3.5  --   AST  --  53*  --   ALT  --  51*  --   ALKPHOS  --  31*  --   BILITOT  --  0.4  --   GFRNONAA 28*  --  32*  GFRAA 32*  --  37*  ANIONGAP 8  --  10     Hematology Recent Labs  Lab 12/30/19 1112 12/30/19 1433 12/31/19 0325  WBC 10.7* 10.9* 10.6*  RBC 4.02 3.12* 3.81*  HGB 12.5 9.8* 11.6*  HCT 39.0 29.8* 35.2*  MCV 97.0 95.5 92.4  MCH 31.1 31.4 30.4  MCHC 32.1 32.9 33.0  RDW 12.7 12.7 14.8  PLT 298 263 246    BNPNo results for input(s): BNP, PROBNP in the last 168  hours.   DDimer No results for input(s): DDIMER in the last 168 hours.   Radiology    CARDIAC CATHETERIZATION  Result Date: 12/30/2019  Prox LAD lesion is 30% stenosed.  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  Post intervention, there is a 5% residual stenosis.  Mild nonobstructive stenosis of 30% in the proximal LAD in the region of the first diagonal and septal perforating artery. Normal ramus intermediate and left circumflex coronary arteries. Dominant RCA with total mid distal occlusion of the PDA vessel. LVEDP 22 mmHg. RECOMMENDATION: DAPT initially but since no stenting was performed duration may be less than 1 year.  Will DC bivalirudin several hours post procedure.  Hydrate.  Aggressive lipid-lowering therapy with target LDL less than 70 and optimal blood pressure control.   DG Chest Portable 1 View  Result Date: 12/30/2019 CLINICAL DATA:  Onset of coughing this morning, central chest pain, RIGHT chest pain radiating across back, shortness of breath, STEMI EXAM: PORTABLE CHEST 1 VIEW COMPARISON:  Portable exam 1149 hours compared to 09/21/2017 FINDINGS: Normal heart size, mediastinal contours, and pulmonary vascularity. Minimal atelectasis or infiltrate at LEFT base. Lungs otherwise clear. No pulmonary infiltrate,  pleural effusion or pneumothorax. Surgical clips RIGHT cervical region question prior thyroid surgery. Bones demineralized. IMPRESSION: Minimal LEFT basilar atelectasis versus infiltrate. Electronically Signed   By: Lavonia Dana M.D.   On: 12/30/2019 12:03    Cardiac Studies   L heart cath   Prox LAD lesion is 30% stenosed.  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  Post intervention, there is a 5% residual stenosis.   Mild nonobstructive stenosis of 30% in the proximal LAD in the region of the first diagonal and septal perforating artery.  Normal ramus intermediate and left circumflex coronary arteries.  Dominant RCA with total mid distal occlusion of the PDA vessel.  LVEDP 22 mmHg.  RECOMMENDATION: DAPT initially but since no stenting was performed duration may be less than 1 year.  Will DC bivalirudin several hours post procedure.  Hydrate.  Aggressive lipid-lowering therapy with target LDL less than 70 and optimal blood pressure control.   Patient Profile     Jessica [name] is a 69 y.o. adult with HTN, HLD, hiatal hernia, GERD, DM, congenital blindness, CKD stage III per notes, anemia, anxiety, osteoporosis, SVT  Assessment & Plan    1  Chest pain  Peak trop 13,000  Pt underwent L heart cath as noted with PCTA to R PDA    Plan for medical Rx   Echo this am   Will review images    With LVEDP will give lasix 80 x 1 now.   2  HTN  BP is controlled   Follow    3  Hx SVT  No recurrence  4  HL    Lipids this am LDL 47   HDL 26  Trieg 216      Continue meds   5  CKD     Cr 1.6 today     6  DM  Continue medical Rx   Tx to floor today   Work with cardiac rehab   Probable d/c tomorrow    For questions or updates, please contact [facility] Please consult [email or web address] for contact info under        Signed, Dorris Carnes, MD  12/31/2019, 9:24 AM

## 2020-01-01 ENCOUNTER — Telehealth: Payer: Self-pay | Admitting: Cardiovascular Disease

## 2020-01-01 LAB — BASIC METABOLIC PANEL
Anion gap: 13 (ref 5–15)
BUN: 35 mg/dL — ABNORMAL HIGH (ref 8–23)
CO2: 21 mmol/L — ABNORMAL LOW (ref 22–32)
Calcium: 9.1 mg/dL (ref 8.9–10.3)
Chloride: 99 mmol/L (ref 98–111)
Creatinine, Ser: 2.22 mg/dL — ABNORMAL HIGH (ref 0.44–1.00)
GFR calc Af Amer: 25 mL/min — ABNORMAL LOW (ref >60)
GFR calc non Af Amer: 22 mL/min — ABNORMAL LOW (ref >60)
Glucose, Bld: 240 mg/dL — ABNORMAL HIGH (ref 70–99)
Potassium: 4.5 mmol/L (ref 3.5–5.1)
Sodium: 133 mmol/L — ABNORMAL LOW (ref 135–145)

## 2020-01-01 LAB — CBC
HCT: 38.3 % (ref 36.0–46.0)
Hemoglobin: 12.6 g/dL (ref 12.0–15.0)
MCH: 30.4 pg (ref 26.0–34.0)
MCHC: 32.9 g/dL (ref 30.0–36.0)
MCV: 92.5 fL (ref 80.0–100.0)
Platelets: 279 10*3/uL (ref 150–400)
RBC: 4.14 MIL/uL (ref 3.87–5.11)
RDW: 14.3 % (ref 11.5–15.5)
WBC: 10.2 10*3/uL (ref 4.0–10.5)
nRBC: 0 % (ref 0.0–0.2)

## 2020-01-01 LAB — GLUCOSE, CAPILLARY
Glucose-Capillary: 176 mg/dL — ABNORMAL HIGH (ref 70–99)
Glucose-Capillary: 283 mg/dL — ABNORMAL HIGH (ref 70–99)

## 2020-01-01 MED ORDER — NITROGLYCERIN 0.4 MG SL SUBL: 0.4000 mg | SUBLINGUAL_TABLET | SUBLINGUAL | 12 refills | Status: DC | PRN

## 2020-01-01 MED ORDER — ROSUVASTATIN CALCIUM 40 MG PO TABS: 40.0000 mg | ORAL_TABLET | Freq: Every day | ORAL | 6 refills | Status: DC

## 2020-01-01 MED ORDER — CARVEDILOL 6.25 MG PO TABS
6.2500 mg | ORAL_TABLET | Freq: Two times a day (BID) | ORAL | Status: DC
  Administered 2020-01-01: 6.25 mg via ORAL
  Filled 2020-01-01: qty 1

## 2020-01-01 MED ORDER — ASPIRIN EC 81 MG PO TBEC
81.0000 mg | DELAYED_RELEASE_TABLET | Freq: Every day | ORAL | 11 refills | Status: DC
Start: 2020-01-01 — End: 2020-06-11

## 2020-01-01 MED ORDER — TICAGRELOR 90 MG PO TABS: 90.0000 mg | ORAL_TABLET | Freq: Two times a day (BID) | ORAL | 11 refills | Status: DC

## 2020-01-01 MED ORDER — CARVEDILOL 6.25 MG PO TABS: 6.2500 mg | ORAL_TABLET | Freq: Two times a day (BID) | ORAL | 3 refills | Status: DC

## 2020-01-01 MED ORDER — ROSUVASTATIN CALCIUM 20 MG PO TABS: 40.0000 mg | ORAL_TABLET | Freq: Every day | ORAL | Status: DC

## 2020-01-01 NOTE — Discharge Summary (Addendum)
Discharge Summary    Patient ID: Jessica [name] MRN: [id number]; DOB: 1950/08/17  Admit date: 12/30/2019 Discharge date: 01/01/2020  Primary Care Provider: Marton Redwood, MD  Primary Cardiologist: Shelva Majestic, MD   Discharge Diagnoses    Active Problems:   Acute ST elevation myocardial infarction (STEMI) [facility])   STEMI (ST elevation myocardial infarction) [facility])   STEMI involving oth coronary artery of inferior wall ([place])   Hematoma with transient hypotension    HTN   HLD  DM Acute kidney injury on stage 3a CKD  Diagnostic Studies/Procedures   Echo 12/31/2019 1. Left ventricular ejection fraction, by estimation, is 50 to 55%. The  left ventricle has low normal function. The left ventricle demonstrates  regional wall motion abnormalities (see scoring diagram/findings for  description). There is mild left  ventricular hypertrophy. Left ventricular diastolic parameters are  consistent with Grade I diastolic dysfunction (impaired relaxation). There  is moderate hypokinesis of the left ventricular, entire inferior wall,  apical segment and inferoseptal wall.   2. Right ventricular systolic function is normal. The right ventricular  size is normal.   3. The mitral valve is grossly normal. No evidence of mitral valve  regurgitation.   4. The aortic valve is tricuspid. Aortic valve regurgitation is not  visualized.   5. The inferior vena cava is normal in size with greater than 50%  respiratory variability, suggesting right atrial pressure of 3 mmHg.    Coronary/Graft Acute MI Revascularization  12/30/2019  CORONARY BALLOON ANGIOPLASTY  LEFT HEART CATH AND CORONARY ANGIOGRAPHY  Conclusion    Prox LAD lesion is 30% stenosed. 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 Post intervention, there is a 5% residual stenosis.   Mild nonobstructive stenosis of 30% in the proximal LAD in the region of the first diagonal and septal perforating artery.   Normal ramus intermediate and left circumflex  coronary arteries.   Dominant RCA with total mid distal occlusion of the PDA vessel.   LVEDP 22 mmHg.   RECOMMENDATION: DAPT initially but since no stenting was performed duration may be less than 1 year.  Will DC bivalirudin several hours post procedure.  Hydrate.  Aggressive lipid-lowering therapy with target LDL less than 70 and optimal blood pressure control.     Diagnostic Dominance: Right  Intervention       History of Present Illness     Jessica [name] is a 69 y.o. adult with hx of HTN, HLD, hiatal hernia, GERD, DM, congenital blindness, CKD stage III per notes, anemia, anxiety, osteoporosis, SVT presented with STEMI.   Patient denies prior cardiac history although SVT is listed in chart  She reported she's been more SOB the last few days with exertion. She also has occasional nonproductive cough. She developed chest pain radiating to her back around 7am 12/30/19 a/w mild nausea and SOB and mild cough. She presented to ED for evaluation and EKG showed NSR with low voltage tracing and concern for subtle ST elevation in II, III, avF, V5-V6. She received 324mg  ASA, 3500 heparin, and NTG drip. Code STEMI was called and she was taken emergently to the cath lab.    Hospital Course     Consultants: None  1. STEMI Emergent cath showed 100% stenosed RPDA s/p ballon angioplasty only  with restoration of TIMI 3 flow. mild nonobstructive stenosis of 30% in the proximal LAD in the region of the first diagonal and septal perforating artery. Hs-troponin [id number].  Started on DAPT with ASA and Brillinta. Echo showed LVEF of  50-55%, grade 1 DD. There  is moderate hypokinesis of the left ventricular, entire inferior wall,  apical segment and inferoseptal wall. Restarted BB at low dose as below.   2. R groin hematoma - Following PCI patient had hematoma development and bleeding surrounding sheath site. Stopped Bivalirudin. Had vagal episode with hypotension and nausea. However quickly rebounded.  Stat Hgb was down to 9.8 from 12.9. She was transfused 2 unit of PRBCS. Given NS bolus and started on low dose norepinephrine. Vital gradually improved.   3. HTN - Home antihypertensive held 2nd to hypotension. BP improved gradually. Restarted Coreg at lower dose 6.25mg  BID. Continue to hold Losartan and Amlodipine.   4. HLD - 12/31/2019: Cholesterol 116; HDL 26; LDL Cholesterol 47; Triglycerides 216; VLDL 43  - Increased home Crestor to 40mg  qd from 10mg  qd    5. Acute on CKD III  - Scr of 1.8 on admit>> then 1.6. However 2.2 just prior to discharge.  - She got Iv lasix 80mg  yesterday (likely cause bump) - Not volume overloaded - Stopped Losartan as above - Consider repeat BMET to follow up.   Did the patient have an acute coronary syndrome (MI, NSTEMI, STEMI, etc) this admission?:  Yes                               AHA/ACC Clinical Performance & Quality Measures: Aspirin prescribed? - Yes ADP Receptor Inhibitor (Plavix/Clopidogrel, Brilinta/Ticagrelor or Effient/Prasugrel) prescribed (includes medically managed patients)? - Yes Beta Blocker prescribed? - Yes High Intensity Statin (Lipitor 40-80mg  or Crestor 20-40mg ) prescribed? - Yes EF assessed during THIS hospitalization? - Yes For EF <40%, was ACEI/ARB prescribed? - Not Applicable (EF >/= 84%) For EF <40%, Aldosterone Antagonist (Spironolactone or Eplerenone) prescribed? - Not Applicable (EF >/= 13%) Cardiac Rehab Phase II ordered (including medically managed patients)? - Yes   Discharge Vitals Blood pressure 117/82, pulse 92, temperature 98 F (36.7 C), temperature source Oral, resp. rate 19, height 5' 1.5" (1.562 m), weight 57.5 kg, SpO2 100 %.  Filed Weights   12/30/19 1130 12/31/19 0600 01/01/20 0436  Weight: 56.7 kg 60 kg 57.5 kg   Physical Exam  Constitutional: He is oriented to person, place, and time and well-developed, well-nourished, and in no distress.  HENT:  Head: Normocephalic and atraumatic.  Eyes: Pupils are  equal, round, and reactive to light. Conjunctivae and EOM are normal.  Cardiovascular: Normal rate and regular rhythm.  Right groin has ecchymosis but no hematoma   Pulmonary/Chest: Effort normal and breath sounds normal.  Abdominal: Soft. Bowel sounds are normal.  Musculoskeletal:     Cervical back: Normal range of motion and neck supple.  Neurological: He is alert and oriented to person, place, and time.  Skin: Skin is warm and dry.  Psychiatric: Affect normal.   Labs & Radiologic Studies    CBC Recent Labs    12/30/19 1433 12/31/19 0325  WBC 10.9* 10.6*  NEUTROABS 8.7*  --   HGB 9.8* 11.6*  HCT 29.8* 35.2*  MCV 95.5 92.4  PLT 263 244   Basic Metabolic Panel Recent Labs    12/30/19 1112 12/31/19 0325  NA 131* 136  K 5.0 4.6  CL 101 107  CO2 22 19*  GLUCOSE 203* 112*  BUN 33* 29*  CREATININE 1.82* 1.60*  CALCIUM 9.4 8.6*   Liver Function Tests Recent Labs    12/30/19 1527  AST 53*  ALT 51*  ALKPHOS 31*  BILITOT 0.4  PROT 6.4*  ALBUMIN 3.5   High Sensitivity Troponin:   Recent Labs  Lab 12/30/19 1112 12/30/19 1352 12/30/19 1527 12/30/19 2250  TROPONINIHS 1,089* 11,353* 16,469* 13,084*    Hemoglobin A1C Recent Labs    12/30/19 1112  HGBA1C 8.2*   Fasting Lipid Panel Recent Labs    12/31/19 0325  CHOL 116  HDL 26*  LDLCALC 47  TRIG 216*  CHOLHDL 4.5   Thyroid Function Tests Recent Labs    12/30/19 1527  TSH 0.203*   _____________  CARDIAC CATHETERIZATION  Result Date: 12/30/2019  Prox LAD lesion is 30% stenosed.  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  Post intervention, there is a 5% residual stenosis.  Mild nonobstructive stenosis of 30% in the proximal LAD in the region of the first diagonal and septal perforating artery. Normal ramus intermediate and left circumflex coronary arteries. Dominant RCA with total mid distal occlusion of the PDA vessel. LVEDP 22 mmHg. RECOMMENDATION: DAPT initially but since no stenting was performed duration  may be less than 1 year.  Will DC bivalirudin several hours post procedure.  Hydrate.  Aggressive lipid-lowering therapy with target LDL less than 70 and optimal blood pressure control.   DG Chest Portable 1 View  Result Date: 12/30/2019 CLINICAL DATA:  Onset of coughing this morning, central chest pain, RIGHT chest pain radiating across back, shortness of breath, STEMI EXAM: PORTABLE CHEST 1 VIEW COMPARISON:  Portable exam 1149 hours compared to 09/21/2017 FINDINGS: Normal heart size, mediastinal contours, and pulmonary vascularity. Minimal atelectasis or infiltrate at LEFT base. Lungs otherwise clear. No pulmonary infiltrate, pleural effusion or pneumothorax. Surgical clips RIGHT cervical region question prior thyroid surgery. Bones demineralized. IMPRESSION: Minimal LEFT basilar atelectasis versus infiltrate. Electronically Signed   By: Lavonia Dana M.D.   On: 12/30/2019 12:03   ECHOCARDIOGRAM COMPLETE  Result Date: 12/31/2019    ECHOCARDIOGRAM REPORT   Patient Name:   Jessica [name] Date of Exam: 12/31/2019 Medical Rec #:  [id number]     Height:       61.5 in Accession #:    [id number]    Weight:       132.3 lb Date of Birth:  1950-11-21    BSA:          1.594 m Patient Age:    18 years      BP:           117/62 mmHg Patient Gender: F             HR:           79 bpm. Exam Location:  Inpatient Procedure: 2D Echo, Cardiac Doppler and Color Doppler Indications:    STEMI I21.3  History:        Patient has no prior history of Echocardiogram examinations.                 Risk Factors:Hypertension, Diabetes and Dyslipidemia.                 Hypothyroidism. GERD. CKD.  Sonographer:    Jonelle Sidle Dance Referring Phys: [address] [place]  1. Left ventricular ejection fraction, by estimation, is 50 to 55%. The left ventricle has low normal function. The left ventricle demonstrates regional wall motion abnormalities (see scoring diagram/findings for description). There is mild left ventricular hypertrophy. Left  ventricular diastolic parameters are consistent with Grade I diastolic dysfunction (impaired relaxation). There is moderate hypokinesis of the left ventricular, entire inferior wall, apical segment  and inferoseptal wall.  2. Right ventricular systolic function is normal. The right ventricular size is normal.  3. The mitral valve is grossly normal. No evidence of mitral valve regurgitation.  4. The aortic valve is tricuspid. Aortic valve regurgitation is not visualized.  5. The inferior vena cava is normal in size with greater than 50% respiratory variability, suggesting right atrial pressure of 3 mmHg. FINDINGS  Left Ventricle: Left ventricular ejection fraction, by estimation, is 50 to 55%. The left ventricle has low normal function. The left ventricle demonstrates regional wall motion abnormalities. Moderate hypokinesis of the left ventricular, entire inferior wall, apical segment and inferoseptal wall. The left ventricular internal cavity size was normal in size. There is mild left ventricular hypertrophy. Left ventricular diastolic parameters are consistent with Grade I diastolic dysfunction (impaired relaxation). Indeterminate filling pressures. Right Ventricle: The right ventricular size is normal. No increase in right ventricular wall thickness. Right ventricular systolic function is normal. Left Atrium: Left atrial size was normal in size. Right Atrium: Right atrial size was normal in size. Pericardium: There is no evidence of pericardial effusion. Mitral Valve: The mitral valve is grossly normal. No evidence of mitral valve regurgitation. Tricuspid Valve: The tricuspid valve is grossly normal. Tricuspid valve regurgitation is trivial. Aortic Valve: The aortic valve is tricuspid. Aortic valve regurgitation is not visualized. Pulmonic Valve: The pulmonic valve was normal in structure. Pulmonic valve regurgitation is not visualized. Aorta: The aortic root and ascending aorta are structurally normal, with no  evidence of dilitation. Venous: The inferior vena cava is normal in size with greater than 50% respiratory variability, suggesting right atrial pressure of 3 mmHg. IAS/Shunts: No atrial level shunt detected by color flow Doppler.  LEFT VENTRICLE PLAX 2D LVIDd:         4.09 cm  Diastology LVIDs:         2.96 cm  LV e' lateral:   4.79 cm/s LV PW:         0.92 cm  LV E/e' lateral: 13.3 LV IVS:        1.02 cm  LV e' medial:    4.35 cm/s LVOT diam:     1.80 cm  LV E/e' medial:  14.6 LV SV:         46 LV SV Index:   29 LVOT Area:     2.54 cm  RIGHT VENTRICLE            IVC RV Basal diam:  2.13 cm    IVC diam: 1.30 cm RV S prime:     8.05 cm/s TAPSE (M-mode): 1.7 cm LEFT ATRIUM             Index       RIGHT ATRIUM          Index LA diam:        3.60 cm 2.26 cm/m  RA Area:     6.53 cm LA Vol (A2C):   36.8 ml 23.09 ml/m RA Volume:   10.00 ml 6.27 ml/m LA Vol (A4C):   22.6 ml 14.18 ml/m LA Biplane Vol: 29.1 ml 18.26 ml/m  AORTIC VALVE LVOT Vmax:   90.90 cm/s LVOT Vmean:  55.900 cm/s LVOT VTI:    0.180 m  AORTA Ao Root diam: 2.60 cm Ao Asc diam:  2.80 cm MITRAL VALVE MV Area (PHT): 3.17 cm     SHUNTS MV Decel Time: 239 msec     Systemic VTI:  0.18 m MV E velocity: 63.50 cm/s  Systemic Diam: 1.80 cm MV A velocity: 112.00 cm/s MV E/A ratio:  0.57 Lyman Bishop MD Electronically signed by Lyman Bishop MD Signature Date/Time: 12/31/2019/10:43:02 AM    Final    Disposition   Pt is being discharged home today in good condition.  Follow-up Plans & Appointments      Discharge Instructions     Amb Referral to Cardiac Rehabilitation   Complete by: As directed    Referring to [facility] CRP   Diagnosis:  PTCA STEMI     After initial evaluation and assessments completed: Virtual Based Care may be provided alone or in conjunction with Phase 2 Cardiac Rehab based on patient barriers.: Yes       Discharge Medications   Allergies as of 01/01/2020       Reactions   Codeine Shortness Of Breath, Nausea And  Vomiting   Erythromycin Nausea And Vomiting   Levaquin [levofloxacin] Shortness Of Breath        Medication List     STOP taking these medications    amLODipine 5 MG tablet Commonly known as: NORVASC   losartan 100 MG tablet Commonly known as: COZAAR       TAKE these medications    acetaminophen 500 MG tablet Commonly known as: TYLENOL Take 500-1,000 mg by mouth every 6 (six) hours as needed for headache (pain).   aspirin EC 81 MG tablet Take 1 tablet (81 mg total) by mouth daily.   carvedilol 6.25 MG tablet Commonly known as: COREG Take 1 tablet (6.25 mg total) by mouth 2 (two) times daily with a meal. What changed:  medication strength how much to take   Co Q 10 100 MG Caps Take 100 mg by mouth daily.   cyanocobalamin 1000 MCG/ML injection Commonly known as: (VITAMIN B-12) Inject 1,000 mcg into the muscle every 30 (thirty) days.   famotidine 20 MG tablet Commonly known as: PEPCID Take 20 mg by mouth 2 (two) times daily.   glipiZIDE 10 MG tablet Commonly known as: GLUCOTROL Take 10 mg by mouth 2 (two) times daily.   levothyroxine 75 MCG tablet Commonly known as: SYNTHROID Take 75 mcg by mouth daily.   LORazepam 0.5 MG tablet Commonly known as: ATIVAN Take 0.5 mg by mouth See admin instructions. Take one tablet (0.5 mg) by mouth daily at bedtime, may also take one tablet (0.5 mg) during the day as needed for anxiety.   magnesium oxide 400 MG tablet Commonly known as: MAG-OX Take 400 mg by mouth daily.   naproxen sodium 220 MG tablet Commonly known as: ALEVE Take 220-440 mg by mouth 2 (two) times daily as needed (pain/headache).   nitroGLYCERIN 0.4 MG SL tablet Commonly known as: NITROSTAT Place 1 tablet (0.4 mg total) under the tongue every 5 (five) minutes x 3 doses as needed for chest pain.   omeprazole 40 MG capsule Commonly known as: PRILOSEC Take 40 mg by mouth 2 (two) times a day.   rosuvastatin 40 MG tablet Commonly known as:  CRESTOR Take 1 tablet (40 mg total) by mouth at bedtime. What changed:  medication strength how much to take   sertraline 50 MG tablet Commonly known as: ZOLOFT Take 50 mg by mouth daily at 6 PM.   ticagrelor 90 MG Tabs tablet Commonly known as: BRILINTA Take 1 tablet (90 mg total) by mouth 2 (two) times daily.           Outstanding Labs/Studies   BMET at follow up   Duration of Discharge  Encounter   Greater than 30 minutes including physician time.  Jarrett Soho, PA 01/01/2020, 11:16 AM   Patient seen, examined. Available data reviewed. Agree with findings, assessment, and plan as outlined by Robbie Lis, PA.  The patient is alert, oriented, in no distress.  Lungs are clear, heart is regular rate and rhythm with no murmur gallop, abdomen soft and nontender, extremities have no edema.  The right groin has ecchymoses but no firm hematoma.  The patient appears stable and has progressed well, now ready for hospital discharge.  Her hemoglobin has stabilized after packed red blood cell transfusion.  There is no further evidence of active bleeding.  She will remain on dual antiplatelet therapy with aspirin and ticagrelor for 1 year as tolerated.  She is on a high intensity statin drug with rosuvastatin.  She will continue to hold losartan and amlodipine because of low blood pressure, and these can be started back as an outpatient as tolerated.  Otherwise we will continue her medications as above.  Sherren Mocha, M.D. 01/01/2020 11:33 AM

## 2020-01-01 NOTE — Progress Notes (Signed)
CARDIAC REHAB PHASE I   PRE:  Rate/Rhythm: 97 SR  BP:  Supine:   Sitting: 117/82  Standing:    SaO2: 98%RA  MODE:  Ambulation: 340 ft   POST:  Rate/Rhythm: 98 SR  BP:  Supine:   Sitting: 120/88  Standing:    SaO2: 99%RA 1120-1145 Pt walked 340 ft on RA with hand held asst. No CP. Tolerated well. No questions re ed done.   Graylon Good, RN BSN  01/01/2020 11:40 AM

## 2020-01-01 NOTE — Plan of Care (Signed)
  Problem: Education: Goal: Knowledge of General Education information will improve Description: Including pain rating scale, medication(s)/side effects and non-pharmacologic comfort measures Outcome: Completed/Met   Problem: Health Behavior/Discharge Planning: Goal: Ability to manage health-related needs will improve Outcome: Completed/Met   Problem: Clinical Measurements: Goal: Ability to maintain clinical measurements within normal limits will improve Outcome: Completed/Met Goal: Will remain free from infection Outcome: Completed/Met Goal: Diagnostic test results will improve Outcome: Completed/Met Goal: Respiratory complications will improve Outcome: Completed/Met Goal: Cardiovascular complication will be avoided Outcome: Completed/Met   Problem: Activity: Goal: Risk for activity intolerance will decrease Outcome: Completed/Met   Problem: Nutrition: Goal: Adequate nutrition will be maintained Outcome: Completed/Met   Problem: Coping: Goal: Level of anxiety will decrease Outcome: Completed/Met   Problem: Elimination: Goal: Will not experience complications related to bowel motility Outcome: Completed/Met Goal: Will not experience complications related to urinary retention Outcome: Completed/Met   Problem: Pain Managment: Goal: General experience of comfort will improve Outcome: Completed/Met   Problem: Safety: Goal: Ability to remain free from injury will improve Outcome: Completed/Met   Problem: Skin Integrity: Goal: Risk for impaired skin integrity will decrease Outcome: Completed/Met   Problem: Education: Goal: Understanding of CV disease, CV risk reduction, and recovery process will improve Outcome: Completed/Met Goal: Individualized Educational Video(s) Outcome: Completed/Met   Problem: Activity: Goal: Ability to return to baseline activity level will improve Outcome: Completed/Met   Problem: Cardiovascular: Goal: Ability to achieve and maintain  adequate cardiovascular perfusion will improve Outcome: Completed/Met Goal: Vascular access site(s) Level 0-1 will be maintained Outcome: Completed/Met   Problem: Health Behavior/Discharge Planning: Goal: Ability to safely manage health-related needs after discharge will improve Outcome: Completed/Met

## 2020-01-01 NOTE — TOC Transition Note (Signed)
Transition of Care [facility]) - CM/SW Discharge Note   Patient Details  Name: Jessica [name] MRN: [id number] Date of Birth: [birth year]  Transition of Care [facility]) CM/SW Contact:  Zenon Mayo, RN Phone Number: 01/01/2020, 12:58 PM   Clinical Narrative:    Patient for possible dc today, NCM spoke with patient , gave her the information about her brilinta co pay for refills.  She states her spouse and daughter will be transporting her home at dc.     Final next level of care: Home/Self Care Barriers to Discharge: No Barriers Identified   Patient Goals and CMS Choice Patient states their goals for this hospitalization and ongoing recovery are:: get better   Choice offered to / list presented to : NA  Discharge Placement                       Discharge Plan and Services                  DME Agency: NA       HH Arranged: NA          Social Determinants of Health (SDOH) Interventions     Readmission Risk Interventions No flowsheet data found.

## 2020-01-01 NOTE — TOC Benefit Eligibility Note (Signed)
Transition of Care [facility]) Benefit Eligibility Note    Patient Details  Name: Jessica [name] MRN: [id number] Date of Birth: Mar 07, 1951   Medication/Dose: Kary Kos 90mg . bid for a 30 day supply  Covered?: Yes  Tier: 3 Drug  Prescription Coverage Preferred Pharmacy: [facility] with Person/Company/Phone Number:: [employer] PH# [phone]  Co-Pay: $47.00  Prior Approval: No  Deductible: (No Deductible)       Shelda Altes Phone Number: 01/01/2020, 11:36 AM

## 2020-01-01 NOTE — Discharge Instructions (Signed)
Heart Attack A heart attack occurs when blood and oxygen supply to the heart is cut off. A heart attack causes damage to the heart that cannot be fixed. A heart attack is also called a myocardial infarction, or MI. If you think you are having a heart attack, do not wait to see if the symptoms will go away. Get medical help right away. What are the causes? This condition may be caused by:  A fatty substance (plaque) in the blood vessels (arteries). This can block the flow of blood to the heart.  A blood clot in the blood vessels that go to the heart. The blood clot blocks blood flow.  Low blood pressure.  An abnormal heartbeat.  Some diseases, such as problems in red blood cells (anemia)orproblems in breathing (respiratory failure).  Tightening (spasm) of a blood vessel that cuts off blood to the heart.  A tear in a blood vessel of the heart.  High blood pressure. What increases the risk? The following factors may make you more likely to develop this condition:  Aging. The older you are, the higher your risk.  Having a personal or family history of chest pain, heart attack, stroke, or narrowing of the arteries in the legs, arms, head, or stomach (peripheral artery disease).  Being female.  Smoking.  Not getting regular exercise.  Being overweight or obese.  Having high blood pressure.  Having high cholesterol.  Having diabetes.  Drinking too much alcohol.  Using illegal drugs, such as cocaine or methamphetamine. What are the signs or symptoms? Symptoms of this condition include:  Chest pain. It may feel like: ? Crushing or squeezing. ? Tightness, pressure, fullness, or heaviness.  Pain in the arm, neck, jaw, back, or upper body.  Shortness of breath.  Heartburn.  Upset stomach (indigestion).  Feeling like you may vomit (nauseous).  Cold sweats.  Feeling tired.  Sudden light-headedness. How is this treated? A heart attack must be treated as soon as  possible. Treatment may include:  Medicines to: ? Break up or dissolve blood clots. ? Thin blood and help prevent blood clots. ? Treat blood pressure. ? Improve blood flow to the heart. ? Reduce pain. ? Reduce cholesterol.  Procedures to widen a blocked artery and keep it open.  Open heart surgery.  Receiving oxygen.  Making your heart strong again (cardiac rehabilitation) through exercise, education, and counseling. Follow these instructions at home: Medicines  Take over-the-counter and prescription medicines only as told by your doctor. You may need to take medicine: ? To keep your blood from clotting too easily. ? To control blood pressure. ? To lower cholesterol. ? To control heart rhythms.  Do not take these medicines unless your doctor says it is okay: ? NSAIDs, such as ibuprofen. ? Supplements that have vitamin A, vitamin E, or both. ? Hormone replacement therapy that has estrogen with or without progestin. Lifestyle      Do not use any products that have nicotine or tobacco, such as cigarettes, e-cigarettes, and chewing tobacco. If you need help quitting, ask your doctor.  Avoid secondhand smoke.  Exercise regularly. Ask your doctor about a cardiac rehab program.  Eat heart-healthy foods. Your doctor will tell you what foods to eat.  Stay at a healthy weight.  Lower your stress level.  Do not use illegal drugs. Alcohol use  Do not drink alcohol if: ? Your doctor tells you not to drink. ? You are pregnant, may be pregnant, or are planning to become pregnant.    If you drink alcohol: ? Limit how much you use to:  0-1 drink a day for women.  0-2 drinks a day for men. ? Know how much alcohol is in your drink. In the [place] one drink equals one 12 oz bottle of beer (355 mL), one 5 oz glass of wine (148 mL), or one 1 oz glass of hard liquor (44 mL). General instructions  Work with your doctor to treat other problems you may have, such as diabetes or high  blood pressure.  Get screened for depression. Get treatment if needed.  Keep your vaccines up to date. Get the flu shot (influenza vaccine) every year.  Keep all follow-up visits as told by your doctor. This is important. Contact a doctor if:  You feel very sad.  You have trouble doing your daily activities. Get help right away if:  You have sudden, unexplained discomfort in your chest, arms, back, neck, jaw, or upper body.  You have shortness of breath.  You have sudden sweating or clammy skin.  You feel like you may vomit.  You vomit.  You feel tired or weak.  You get light-headed or dizzy.  You feel your heart beating fast.  You feel your heart skipping beats.  You have blood pressure that is higher than 180/120. These symptoms may be an emergency. Do not wait to see if the symptoms will go away. Get medical help right away. Call your local emergency services (911 in the [place] Do not drive yourself to the hospital. Summary  A heart attack occurs when blood and oxygen supply to the heart is cut off.  Do not take NSAIDs unless your doctor says it is okay.  Do not smoke. Avoid secondhand smoke.  Exercise regularly. Ask your doctor about a cardiac rehab program. This information is not intended to replace advice given to you by your health care provider. Make sure you discuss any questions you have with your health care provider. Document Revised: 10/23/2018 Document Reviewed: 10/23/2018 Elsevier Patient Education  2020 Elsevier Inc.  

## 2020-01-01 NOTE — Progress Notes (Signed)
Explained and discussed discharge instructions, made pt aware of appointments, prescriptions. Going home with husband. No complaints voiced.

## 2020-01-01 NOTE — Telephone Encounter (Signed)
Patient has TOC appt with Jory Sims on 6/16 requested by [facility].

## 2020-01-03 NOTE — Telephone Encounter (Signed)
Patient contacted regarding discharge from [facility] on 01/01/20.  Patient understands to follow up with provider Jory Sims NP on 01/09/20 at 2:15 PM at [facility]. Patient understands discharge instructions? YES Patient understands medications and regiment? YES Patient understands to bring all medications to this visit? YES

## 2020-01-04 ENCOUNTER — Telehealth (HOSPITAL_COMMUNITY): Payer: Self-pay

## 2020-01-04 ENCOUNTER — Other Ambulatory Visit: Payer: Self-pay

## 2020-01-04 NOTE — Telephone Encounter (Signed)
Faxed pt referral to [facility] cardiac rehab per Phase I

## 2020-01-04 NOTE — Patient Outreach (Signed)
[facility]) Care Management  01/04/2020  Jessica [name] [birth year] [id number]    EMMI-General Discharge RED ON EMMI ALERT Day # 1 Date: 01/03/2020 Red Alert Reason: "Know who to call about changes in condition? No"   Outreach attempt # 1 to patient. Spoke with patient who denies any acute issues or concerns at present. She voices that she has very supportive spouse and daughter assisting her as needed.Reviewed and addressed red alert with patient. She reports she id not understand question fully. However, she is able to verbalize that she is to call 911 for any life threatening conditions or s/s of heart attack.She confirms she has PCP and cardiology office contact info and knows when to call them. She has follow up scheduled and family with taking her to appt. Patient confirms she has all her meds in the home and no issues or concerns regarding them. She denies any RN CM needs or concerns at this time.      Plan: RN CM will close case at this time.   Enzo Montgomery, RN,BSN,CCM [facility] Management Telephonic Care Management Coordinator Direct Phone: [phone] Toll Free: [phone] Fax: [phone]

## 2020-01-07 ENCOUNTER — Other Ambulatory Visit: Payer: Self-pay

## 2020-01-07 ENCOUNTER — Inpatient Hospital Stay (HOSPITAL_COMMUNITY): Admission: RE | Admit: 2020-01-07 | Payer: Medicare HMO | Source: Ambulatory Visit

## 2020-01-07 NOTE — Patient Outreach (Signed)
[facility]) Care Management  01/07/2020  Jessica [name] 12-27-1950 [id number]    EMMI-General Discharge RED ON EMMI ALERT Day # 4 Date: 01/06/2020 Red Alert Reason: "Sad/hopeless/anxious/empty? Yes Other questions/problems? Yes"   Outreach attempt # 1 to patient.Spoke with patient who reports overall she has being doing well. She does report that she had some indigestion over the weekend. She states she ate a lot of fresh fruit which normally causes that problem for her. She took Famotidine and sxs were relieved. She reports that she had a very mild and short nosebleed a few days ago. She applied pressure and sxs were relieved. RN CM discussed abnormal bleeding, s/s, ways to treat and when to seek medical attention due to blood thinner usage. She voiced understanding. She reports that she had been feeling a little down as she has not been able to do all the things she physically once was able to do and not as active. She is aware that this is normal and that it will take time for her body to heal and recover. She has supportive family to encourage and assist her. She has completed post discharge automated calls. She goes for MD follow up appt Wed. She denies any RN CM needs or concerns at this time.     Plan: RN CM will close case at this time.   Enzo Montgomery, RN,BSN,CCM [facility] Management Telephonic Care Management Coordinator Direct Phone: [phone] Toll Free: [phone] Fax: [phone]

## 2020-01-08 NOTE — Progress Notes (Signed)
Cardiology Office Note   Date:  01/09/2020   ID:  Jessica [name], DOB 01-26-1951, MRN [id number]  PCP:  Marton Redwood, MD  Cardiologist:  Dr. Claiborne Billings  CC: Follow Up post hospitalization.   History of Present Illness: Jessica [name] is a 69 y.o. adult who presents for ongoing assessment and management of CAD status post hospitalization for STEMI.  The patient also has a history of hypertension hyperlipidemia diabetes, congenital blindness, chronic kidney disease stage III, anemia, anxiety, hiatal hernia, osteoporosis, and SVT.  She arrived in the emergency room with severe chest pressure and shortness of breath.  Emergent cardiac catheterization revealed 100% stenosed RPDA, status post balloon angioplasty only with restoration of TIMI-3 flow, mild nonobstructive stenosis of 30% in the proximal LAD and in the region of the first diagonal and septal perforating artery.  The patient's troponin was elevated at 13,084.    The patient was started on dual antiplatelet therapy with aspirin and Brilinta.  EF did not reveal any changes in LV systolic function with an EF of 50 to 55% with grade 1 diastolic dysfunction.  There was moderate hypokinesis of the left ventricular entire inferior wall apical segment and inferior wall.  The patient was also started on beta-blocker.  Unfortunately following the procedure the patient had a right groin hematoma with associated bleeding surrounding the sheath site.  He also had a vagal episode with hypotension and nausea but quickly rebounded.  His hemoglobin dropped from 12.9-9.8 and therefore he was transfused 2 units of packed red blood cells and given IV saline bolus and started on low-dose norepinephrine.   Crestor was increased to 40 mg daily from 10 mg daily.  She was continued on amlodipine, but losartan was discontinued.  She comes today with complaints of elevated blood pressure.  She states that she feels her heart rate in her ears.  She is no longer on amlodipine.   Her PCP took her off of that.  Blood pressures have been running high over the last 24 hours in the 400Q and 676P systolic.  Heart rate has been elevated as well.  She denies chest pain or dyspnea on exertion.  She is visually impaired and is able to get her medications and take them as directed.   Past Medical History:  Diagnosis Date  . Anemia   . Anxiety   . CKD (chronic kidney disease)    CKD III per PCP notes-pt denies  . Congenital blindness   . Diabetes mellitus   . Diverticula of small intestine   . Esophageal stricture   . GERD (gastroesophageal reflux disease)   . H/O hiatal hernia   . Hyperlipidemia   . Hypertension   . Hypothyroidism   . Iron deficiency anemia   . Low magnesium level   . Low sodium levels   . Osteopenia   . Osteoporosis   . Postoperative nausea and vomiting   . Renal disease   . Retinitis   . SVT (supraventricular tachycardia) ([place])   . Vitamin B 12 deficiency     Past Surgical History:  Procedure Laterality Date  . BACK SURGERY    . CARPAL TUNNEL RELEASE     Right   . CESAREAN SECTION    . CHOLECYSTECTOMY  1985  . COLONOSCOPY    . CORONARY BALLOON ANGIOPLASTY N/A 12/30/2019   Procedure: CORONARY BALLOON ANGIOPLASTY;  Surgeon: Troy Sine, MD;  Location: [facility];  Service: Cardiovascular;  Laterality: N/A;  . CORONARY/GRAFT ACUTE  MI REVASCULARIZATION N/A 12/30/2019   Procedure: Coronary/Graft Acute MI Revascularization;  Surgeon: Troy Sine, MD;  Location: [facility];  Service: Cardiovascular;  Laterality: N/A;  . HERNIA REPAIR    . LEFT HEART CATH AND CORONARY ANGIOGRAPHY N/A 12/30/2019   Procedure: LEFT HEART CATH AND CORONARY ANGIOGRAPHY;  Surgeon: Troy Sine, MD;  Location: [facility];  Service: Cardiovascular;  Laterality: N/A;  . Right knee arthroscopy  1993  . Right thyroidectomy  2007  . TOTAL ABDOMINAL HYSTERECTOMY W/ BILATERAL SALPINGOOPHORECTOMY  1993  . UPPER GASTROINTESTINAL ENDOSCOPY        Current Outpatient Medications  Medication Sig Dispense Refill  . acetaminophen (TYLENOL) 500 MG tablet Take 500-1,000 mg by mouth every 6 (six) hours as needed for headache (pain).    Marland Kitchen aspirin EC 81 MG tablet Take 1 tablet (81 mg total) by mouth daily. 30 tablet 11  . carvedilol (COREG) 12.5 MG tablet Take 1 tablet (12.5 mg total) by mouth 2 (two) times daily with a meal. 180 tablet 3  . Coenzyme Q10 (CO Q 10) 100 MG CAPS Take 100 mg by mouth daily.     . cyanocobalamin (,VITAMIN B-12,) 1000 MCG/ML injection Inject 1,000 mcg into the muscle every 30 (thirty) days.    . famotidine (PEPCID) 20 MG tablet Take 20 mg by mouth 2 (two) times daily.    Marland Kitchen glipiZIDE (GLUCOTROL) 10 MG tablet Take 10 mg by mouth 2 (two) times daily.    Marland Kitchen levothyroxine (SYNTHROID) 75 MCG tablet Take 75 mcg by mouth daily.    Marland Kitchen LORazepam (ATIVAN) 0.5 MG tablet Take 0.5 mg by mouth See admin instructions. Take one tablet (0.5 mg) by mouth daily at bedtime, may also take one tablet (0.5 mg) during the day as needed for anxiety.    . magnesium oxide (MAG-OX) 400 MG tablet Take 400 mg by mouth daily.    . naproxen sodium (ALEVE) 220 MG tablet Take 220-440 mg by mouth 2 (two) times daily as needed (pain/headache).    . nitroGLYCERIN (NITROSTAT) 0.4 MG SL tablet Place 1 tablet (0.4 mg total) under the tongue every 5 (five) minutes x 3 doses as needed for chest pain. 25 tablet 12  . omeprazole (PRILOSEC) 40 MG capsule Take 40 mg by mouth 2 (two) times a day.    . rosuvastatin (CRESTOR) 40 MG tablet Take 1 tablet (40 mg total) by mouth at bedtime. 30 tablet 6  . sertraline (ZOLOFT) 50 MG tablet Take 50 mg by mouth daily at 6 PM.     . ticagrelor (BRILINTA) 90 MG TABS tablet Take 1 tablet (90 mg total) by mouth 2 (two) times daily. 60 tablet 11   No current facility-administered medications for this visit.    Allergies:   Codeine, Erythromycin, and Levaquin [levofloxacin]    Social History:  The patient  reports that he  has never smoked. He has never used smokeless tobacco. He reports that he does not drink alcohol and does not use drugs.   Family History:  The patient's family history includes COPD in his father; CVA in his mother; Coronary artery disease in his father; Diabetes Mellitus II in his maternal grandmother and mother; Heart disease in his mother; Hyperlipidemia in his sister; Hypertension in his sister and sister; Uterine cancer in his sister.    ROS: All other systems are reviewed and negative. Unless otherwise mentioned in H&P    PHYSICAL EXAM: VS:  BP (!) 172/94  Pulse 75   Ht 5\' 1"  (1.549 m)   Wt 128 lb (58.1 kg)   SpO2 98%   BMI 24.19 kg/m  , BMI Body mass index is 24.19 kg/m. GEN: Well nourished, well developed, in no acute distress HEENT: normal Neck: no JVD, carotid bruits, or masses Cardiac: RRR, tachycardic; no murmurs, rubs, or gallops,no edema  Respiratory:  Clear to auscultation bilaterally, normal work of breathing GI: soft, nontender, nondistended, + BS MS: no deformity or atrophy.  Right groin examined no hematoma no bleeding no signs of infection, old ecchymosis noted distal to the cardiac catheterization insertion site. Skin: warm and dry, no rash Neuro:  Strength and sensation are intact Psych: euthymic mood, full affect   EKG: Not completed this office visit  Recent Labs: 12/30/2019: ALT 51; TSH 0.203 01/01/2020: BUN 35; Creatinine, Ser 2.22; Hemoglobin 12.6; Platelets 279; Potassium 4.5; Sodium 133    Lipid Panel    Component Value Date/Time   CHOL 116 12/31/2019 0325   TRIG 216 (H) 12/31/2019 0325   HDL 26 (L) 12/31/2019 0325   CHOLHDL 4.5 12/31/2019 0325   VLDL 43 (H) 12/31/2019 0325   LDLCALC 47 12/31/2019 0325      Wt Readings from Last 3 Encounters:  01/09/20 128 lb (58.1 kg)  01/01/20 126 lb 12.2 oz (57.5 kg)  02/07/19 125 lb (56.7 kg)      Other studies Reviewed: Echo 12/31/2019 1. Left ventricular ejection fraction, by estimation, is 50 to  55%. The  left ventricle has low normal function. The left ventricle demonstrates  regional wall motion abnormalities (see scoring diagram/findings for  description). There is mild left  ventricular hypertrophy. Left ventricular diastolic parameters are  consistent with Grade I diastolic dysfunction (impaired relaxation). There  is moderate hypokinesis of the left ventricular, entire inferior wall,  apical segment and inferoseptal wall.  2. Right ventricular systolic function is normal. The right ventricular  size is normal.  3. The mitral valve is grossly normal. No evidence of mitral valve  regurgitation.  4. The aortic valve is tricuspid. Aortic valve regurgitation is not  visualized.  5. The inferior vena cava is normal in size with greater than 50%  respiratory variability, suggesting right atrial pressure of 3 mmHg.    Coronary/Graft Acute MI Revascularization  12/30/2019  CORONARY BALLOON ANGIOPLASTY  LEFT HEART CATH AND CORONARY ANGIOGRAPHY  Conclusion    Prox LAD lesion is 30% stenosed.  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  Post intervention, there is a 5% residual stenosis.  Mild nonobstructive stenosis of 30% in the proximal LAD in the region of the first diagonal and septal perforating artery.  Normal ramus intermediate and left circumflex coronary arteries.  Dominant RCA with total mid distal occlusion of the PDA vessel.  LVEDP 22 mmHg.  RECOMMENDATION: DAPT initially but since no stenting was performed duration may be less than 1 year. Will DC bivalirudin several hours post procedure. Hydrate. Aggressive lipid-lowering therapy with target LDL less than 70 and optimal blood pressure control.    Diagnostic Dominance: Right  Intervention        ASSESSMENT AND PLAN:  1.  Hypertension: I have rechecked her blood pressure in the clinic room and found to be 168/76.  We will increase her carvedilol to 12.5 mg twice daily to assist with blood  pressure control and heart rate control.  She is to take her blood pressure daily at the same time every day and record this.  I will see her back  in 1 month to evaluate her response to medications.  If blood pressure does not decrease or she has significant drop in her blood pressure she is to call us immediately.  2.  CAD: Status post cardiac catheterization with angioplasty of the RPDA which was totally occluded.  She denies any further complaints of chest discomfort or chest pressure despite elevated BP.  She will have a follow-up BMET on next office visit.  She will continue on dual antiplatelet therapy with aspirin and Brilinta.  3.  Hyperlipidemia: Continue atorvastatin 40 mg daily.  Follow-up lipids and LFTs in 3 months with goal of LDL less than 70.  4.  Depression: She states she has been having increasing waves of depression since discharge from the hospital.  I have advised her to speak with her PCP as he did start her on Zoloft 50 mg daily.  Any medication titration should be discussed with him.  Current medicines are reviewed at length with the patient today.  I have spent 30 minutes dedicated to the care of this patient on the date of this encounter to include pre-visit review of records, assessment, management and diagnostic testing,with shared decision making.  Labs/ tests ordered today include:  Phill Myron. West Pugh, ANP, AACC   01/09/2020 2:49 PM    [facility] [place] [address] Office [phone] Fax [phone]  Notice: This dictation was prepared with Dragon dictation along with smaller phrase technology. Any transcriptional errors that result from this process are unintentional and may not be corrected upon review.

## 2020-01-09 ENCOUNTER — Encounter: Payer: Self-pay | Admitting: Adult Health

## 2020-01-09 ENCOUNTER — Other Ambulatory Visit: Payer: Self-pay

## 2020-01-09 ENCOUNTER — Ambulatory Visit (INDEPENDENT_AMBULATORY_CARE_PROVIDER_SITE_OTHER): Payer: Medicare HMO | Admitting: Adult Health

## 2020-01-09 VITALS — BP 172/94 | HR 75 | Ht 61.0 in | Wt 128.0 lb

## 2020-01-09 DIAGNOSIS — E7800 Pure hypercholesterolemia, unspecified: Secondary | ICD-10-CM

## 2020-01-09 DIAGNOSIS — F32A Depression, unspecified: Secondary | ICD-10-CM

## 2020-01-09 DIAGNOSIS — I2510 Atherosclerotic heart disease of native coronary artery without angina pectoris: Secondary | ICD-10-CM | POA: Diagnosis not present

## 2020-01-09 DIAGNOSIS — F329 Major depressive disorder, single episode, unspecified: Secondary | ICD-10-CM

## 2020-01-09 DIAGNOSIS — I10 Essential (primary) hypertension: Secondary | ICD-10-CM

## 2020-01-09 MED ORDER — CARVEDILOL 12.5 MG PO TABS: 12.5000 mg | ORAL_TABLET | Freq: Two times a day (BID) | ORAL | 3 refills | Status: DC

## 2020-01-09 NOTE — Patient Instructions (Signed)
Medication Instructions:  INCREASE- Carvedilol 12.5 mg by mouth twice a day  *If you need a refill on your cardiac medications before your next appointment, please call your pharmacy*   Lab Work: None Ordered   Testing/Procedures: None Ordered   Follow-Up: At [facility], you and your health needs are our priority.  As part of our continuing mission to provide you with exceptional heart care, we have created designated Provider Care Teams.  These Care Teams include your primary Cardiologist (physician) and Advanced Practice Providers (APPs -  Physician Assistants and Nurse Practitioners) who all work together to provide you with the care you need, when you need it.  We recommend signing up for the patient portal called "MyChart".  Sign up information is provided on this After Visit Summary.  MyChart is used to connect with patients for Virtual Visits (Telemedicine).  Patients are able to view lab/test results, encounter notes, upcoming appointments, etc.  Non-urgent messages can be sent to your provider as well.   To learn more about what you can do with MyChart, go to [email or web address].    Your next appointment:   Wednesday July 14th @ 2:45 pm  The format for your next appointment:   In Person  Provider:   Jory Sims, DNP, ANP

## 2020-01-10 ENCOUNTER — Ambulatory Visit (HOSPITAL_BASED_OUTPATIENT_CLINIC_OR_DEPARTMENT_OTHER): Admission: RE | Admit: 2020-01-10 | Payer: Medicare HMO | Source: Home / Self Care | Admitting: Orthopedic Surgery

## 2020-01-10 ENCOUNTER — Encounter (HOSPITAL_BASED_OUTPATIENT_CLINIC_OR_DEPARTMENT_OTHER): Admission: RE | Payer: Self-pay | Source: Home / Self Care

## 2020-01-10 SURGERY — CARPAL TUNNEL RELEASE
Anesthesia: Regional | Site: Wrist | Laterality: Left

## 2020-01-13 ENCOUNTER — Emergency Department (HOSPITAL_COMMUNITY): Payer: Medicare HMO

## 2020-01-13 ENCOUNTER — Other Ambulatory Visit: Payer: Self-pay

## 2020-01-13 ENCOUNTER — Encounter (HOSPITAL_COMMUNITY): Payer: Self-pay | Admitting: Emergency Medicine

## 2020-01-13 ENCOUNTER — Observation Stay (HOSPITAL_COMMUNITY)
Admission: EM | Admit: 2020-01-13 | Discharge: 2020-01-14 | Disposition: A | Discharge status: Home / Self Care | Payer: Medicare HMO | Attending: Cardiology | Admitting: Cardiology

## 2020-01-13 DIAGNOSIS — I129 Hypertensive chronic kidney disease with stage 1 through stage 4 chronic kidney disease, or unspecified chronic kidney disease: Secondary | ICD-10-CM | POA: Diagnosis not present

## 2020-01-13 DIAGNOSIS — Z794 Long term (current) use of insulin: Secondary | ICD-10-CM | POA: Insufficient documentation

## 2020-01-13 DIAGNOSIS — I25119 Atherosclerotic heart disease of native coronary artery with unspecified angina pectoris: Secondary | ICD-10-CM

## 2020-01-13 DIAGNOSIS — F419 Anxiety disorder, unspecified: Secondary | ICD-10-CM

## 2020-01-13 DIAGNOSIS — Z9861 Coronary angioplasty status: Secondary | ICD-10-CM | POA: Insufficient documentation

## 2020-01-13 DIAGNOSIS — N183 Chronic kidney disease, stage 3 unspecified: Secondary | ICD-10-CM | POA: Insufficient documentation

## 2020-01-13 DIAGNOSIS — D649 Anemia, unspecified: Secondary | ICD-10-CM

## 2020-01-13 DIAGNOSIS — Z20822 Contact with and (suspected) exposure to covid-19: Secondary | ICD-10-CM | POA: Diagnosis not present

## 2020-01-13 DIAGNOSIS — R079 Chest pain, unspecified: Secondary | ICD-10-CM | POA: Diagnosis not present

## 2020-01-13 DIAGNOSIS — Z7982 Long term (current) use of aspirin: Secondary | ICD-10-CM | POA: Diagnosis not present

## 2020-01-13 DIAGNOSIS — E118 Type 2 diabetes mellitus with unspecified complications: Secondary | ICD-10-CM | POA: Diagnosis present

## 2020-01-13 DIAGNOSIS — R0789 Other chest pain: Secondary | ICD-10-CM | POA: Diagnosis not present

## 2020-01-13 DIAGNOSIS — R42 Dizziness and giddiness: Secondary | ICD-10-CM | POA: Diagnosis not present

## 2020-01-13 DIAGNOSIS — E1122 Type 2 diabetes mellitus with diabetic chronic kidney disease: Secondary | ICD-10-CM | POA: Diagnosis not present

## 2020-01-13 DIAGNOSIS — K219 Gastro-esophageal reflux disease without esophagitis: Secondary | ICD-10-CM | POA: Diagnosis present

## 2020-01-13 DIAGNOSIS — I10 Essential (primary) hypertension: Secondary | ICD-10-CM | POA: Diagnosis not present

## 2020-01-13 DIAGNOSIS — Z79899 Other long term (current) drug therapy: Secondary | ICD-10-CM | POA: Diagnosis not present

## 2020-01-13 DIAGNOSIS — R0602 Shortness of breath: Secondary | ICD-10-CM | POA: Diagnosis not present

## 2020-01-13 DIAGNOSIS — I2109 ST elevation (STEMI) myocardial infarction involving other coronary artery of anterior wall: Secondary | ICD-10-CM | POA: Diagnosis not present

## 2020-01-13 DIAGNOSIS — I2119 ST elevation (STEMI) myocardial infarction involving other coronary artery of inferior wall: Secondary | ICD-10-CM | POA: Diagnosis present

## 2020-01-13 DIAGNOSIS — E785 Hyperlipidemia, unspecified: Secondary | ICD-10-CM | POA: Diagnosis not present

## 2020-01-13 DIAGNOSIS — R000 Tachycardia, unspecified: Secondary | ICD-10-CM | POA: Diagnosis not present

## 2020-01-13 DIAGNOSIS — N184 Chronic kidney disease, stage 4 (severe): Secondary | ICD-10-CM

## 2020-01-13 HISTORY — DX: ST elevation (STEMI) myocardial infarction of unspecified site: I21.3

## 2020-01-13 LAB — CBC
HCT: 34.9 % — ABNORMAL LOW (ref 36.0–46.0)
Hemoglobin: 11.2 g/dL — ABNORMAL LOW (ref 12.0–15.0)
MCH: 30.3 pg (ref 26.0–34.0)
MCHC: 32.1 g/dL (ref 30.0–36.0)
MCV: 94.3 fL (ref 80.0–100.0)
Platelets: 363 10*3/uL (ref 150–400)
RBC: 3.7 MIL/uL — ABNORMAL LOW (ref 3.87–5.11)
RDW: 13.5 % (ref 11.5–15.5)
WBC: 9.9 10*3/uL (ref 4.0–10.5)
nRBC: 0 % (ref 0.0–0.2)

## 2020-01-13 LAB — BASIC METABOLIC PANEL
Anion gap: 9 (ref 5–15)
BUN: 21 mg/dL (ref 8–23)
CO2: 21 mmol/L — ABNORMAL LOW (ref 22–32)
Calcium: 9.1 mg/dL (ref 8.9–10.3)
Chloride: 100 mmol/L (ref 98–111)
Creatinine, Ser: 1.94 mg/dL — ABNORMAL HIGH (ref 0.44–1.00)
GFR calc Af Amer: 30 mL/min — ABNORMAL LOW (ref >60)
GFR calc non Af Amer: 26 mL/min — ABNORMAL LOW (ref >60)
Glucose, Bld: 191 mg/dL — ABNORMAL HIGH (ref 70–99)
Potassium: 5.3 mmol/L — ABNORMAL HIGH (ref 3.5–5.1)
Sodium: 130 mmol/L — ABNORMAL LOW (ref 135–145)

## 2020-01-13 LAB — TROPONIN I (HIGH SENSITIVITY)
Troponin I (High Sensitivity): 29 ng/L — ABNORMAL HIGH (ref <18)
Troponin I (High Sensitivity): 28 ng/L — ABNORMAL HIGH (ref <18)

## 2020-01-13 LAB — GLUCOSE, CAPILLARY: Glucose-Capillary: 242 mg/dL — ABNORMAL HIGH (ref 70–99)

## 2020-01-13 LAB — SARS CORONAVIRUS 2 BY RT PCR (HOSPITAL ORDER, PERFORMED IN ~~LOC~~ HOSPITAL LAB): SARS Coronavirus 2: NEGATIVE

## 2020-01-13 MED ORDER — SERTRALINE HCL 50 MG PO TABS
50.0000 mg | ORAL_TABLET | Freq: Every day | ORAL | Status: DC
  Administered 2020-01-13: 50 mg via ORAL
  Filled 2020-01-13 (×2): qty 1

## 2020-01-13 MED ORDER — NITROGLYCERIN 0.4 MG SL SUBL: 0.4000 mg | SUBLINGUAL_TABLET | SUBLINGUAL | Status: DC | PRN

## 2020-01-13 MED ORDER — TICAGRELOR 90 MG PO TABS
90.0000 mg | ORAL_TABLET | Freq: Two times a day (BID) | ORAL | Status: DC
  Administered 2020-01-13 – 2020-01-14 (×3): 90 mg via ORAL
  Filled 2020-01-13 (×3): qty 1

## 2020-01-13 MED ORDER — ASPIRIN 81 MG PO CHEW: 324.0000 mg | CHEWABLE_TABLET | ORAL | Status: AC

## 2020-01-13 MED ORDER — FAMOTIDINE 20 MG PO TABS
20.0000 mg | ORAL_TABLET | Freq: Two times a day (BID) | ORAL | Status: DC
  Administered 2020-01-13 – 2020-01-14 (×3): 20 mg via ORAL
  Filled 2020-01-13 (×3): qty 1

## 2020-01-13 MED ORDER — PANTOPRAZOLE SODIUM 40 MG PO TBEC
40.0000 mg | DELAYED_RELEASE_TABLET | Freq: Two times a day (BID) | ORAL | Status: DC
  Administered 2020-01-13 – 2020-01-14 (×2): 40 mg via ORAL
  Filled 2020-01-13 (×2): qty 1

## 2020-01-13 MED ORDER — LEVOTHYROXINE SODIUM 75 MCG PO TABS
75.0000 ug | ORAL_TABLET | Freq: Every day | ORAL | Status: DC
  Administered 2020-01-13 – 2020-01-14 (×2): 75 ug via ORAL
  Filled 2020-01-13 (×2): qty 1

## 2020-01-13 MED ORDER — CYANOCOBALAMIN 1000 MCG/ML IJ SOLN: 1000.0000 ug | INTRAMUSCULAR | Status: DC

## 2020-01-13 MED ORDER — LORAZEPAM 0.5 MG PO TABS
0.5000 mg | ORAL_TABLET | Freq: Every evening | ORAL | Status: DC | PRN
  Filled 2020-01-13: qty 1

## 2020-01-13 MED ORDER — MAGNESIUM OXIDE 400 (241.3 MG) MG PO TABS
400.0000 mg | ORAL_TABLET | Freq: Every day | ORAL | Status: DC
  Administered 2020-01-13 – 2020-01-14 (×2): 400 mg via ORAL
  Filled 2020-01-13 (×5): qty 1

## 2020-01-13 MED ORDER — ISOSORBIDE MONONITRATE ER 30 MG PO TB24
15.0000 mg | ORAL_TABLET | Freq: Every day | ORAL | Status: DC
  Administered 2020-01-13 – 2020-01-14 (×2): 15 mg via ORAL
  Filled 2020-01-13 (×2): qty 1

## 2020-01-13 MED ORDER — SODIUM CHLORIDE 0.9% FLUSH
3.0000 mL | Freq: Once | INTRAVENOUS | Status: AC
  Administered 2020-01-13: 3 mL via INTRAVENOUS

## 2020-01-13 MED ORDER — ASPIRIN EC 81 MG PO TBEC: 81.0000 mg | DELAYED_RELEASE_TABLET | Freq: Every day | ORAL | Status: DC

## 2020-01-13 MED ORDER — INSULIN ASPART 100 UNIT/ML ~~LOC~~ SOLN
0.0000 [IU] | Freq: Three times a day (TID) | SUBCUTANEOUS | Status: DC
  Administered 2020-01-13: 5 [IU] via SUBCUTANEOUS
  Administered 2020-01-14: 2 [IU] via SUBCUTANEOUS
  Administered 2020-01-14: 3 [IU] via SUBCUTANEOUS

## 2020-01-13 MED ORDER — ONDANSETRON HCL 4 MG/2ML IJ SOLN: 4.0000 mg | Freq: Four times a day (QID) | INTRAMUSCULAR | Status: DC | PRN

## 2020-01-13 MED ORDER — HEPARIN SODIUM (PORCINE) 5000 UNIT/ML IJ SOLN
5000.0000 [IU] | Freq: Three times a day (TID) | INTRAMUSCULAR | Status: DC
  Administered 2020-01-13: 5000 [IU] via SUBCUTANEOUS
  Filled 2020-01-13 (×2): qty 1

## 2020-01-13 MED ORDER — ASPIRIN EC 81 MG PO TBEC
81.0000 mg | DELAYED_RELEASE_TABLET | Freq: Every day | ORAL | Status: DC
  Administered 2020-01-13 – 2020-01-14 (×2): 81 mg via ORAL
  Filled 2020-01-13: qty 1

## 2020-01-13 MED ORDER — AMLODIPINE BESYLATE 5 MG PO TABS
5.0000 mg | ORAL_TABLET | Freq: Every day | ORAL | Status: DC
  Administered 2020-01-13 – 2020-01-14 (×2): 5 mg via ORAL
  Filled 2020-01-13 (×2): qty 1

## 2020-01-13 MED ORDER — CARVEDILOL 12.5 MG PO TABS
12.5000 mg | ORAL_TABLET | Freq: Two times a day (BID) | ORAL | Status: DC
  Administered 2020-01-13 – 2020-01-14 (×2): 12.5 mg via ORAL
  Filled 2020-01-13 (×2): qty 1

## 2020-01-13 MED ORDER — ACETAMINOPHEN 325 MG PO TABS: 650.0000 mg | ORAL_TABLET | ORAL | Status: DC | PRN

## 2020-01-13 MED ORDER — ROSUVASTATIN CALCIUM 20 MG PO TABS
40.0000 mg | ORAL_TABLET | Freq: Every day | ORAL | Status: DC
  Administered 2020-01-13: 40 mg via ORAL
  Filled 2020-01-13: qty 2

## 2020-01-13 MED ORDER — ASPIRIN 300 MG RE SUPP: 300.0000 mg | RECTAL | Status: AC

## 2020-01-13 NOTE — ED Triage Notes (Signed)
Pt to triage via [facility] from home.  Woke up last night with chest pain.  Pain worse since waking up this morning and felt like she was going to pass out. 7/10 sharp pain to center of chest that radiates to back. Reports SOB on exertion and nausea.  MI 2 weeks ago with stent placement.  Took 2 home NTG and 1 by EMS with slight relief.  Zofran 4mg  IV and ASA 324mg  given PTA.  20g RAC.  CBG 129.  Pain now 4/10.

## 2020-01-13 NOTE — ED Notes (Signed)
Cardiology at bedside.

## 2020-01-13 NOTE — ED Notes (Signed)
Pt asked for food. Informed [place] - RN.

## 2020-01-13 NOTE — ED Provider Notes (Signed)
[facility] Provider Note   CSN: [id number] Arrival date & time: 01/13/20  0901     History Chief Complaint  Patient presents with  . Chest Pain    PORCHA DEBLANC is a 69 y.o. adult.  HPI  HPI: A 69 year old patient with a history of hypertension presents for evaluation of chest pain. Initial onset of pain was more than 6 hours ago. The patient's chest pain is described as heaviness/pressure/tightness, is sharp, is not worse with exertion and is relieved by nitroglycerin. The patient's chest pain is not middle- or left-sided, is not well-localized and does not radiate to the arms/jaw/neck. The patient does not complain of nausea and denies diaphoresis. The patient has no history of stroke, has no history of peripheral artery disease, has not smoked in the past 90 days, denies any history of treated diabetes, has no relevant family history of coronary artery disease (first degree relative at less than age 41), has no history of hypercholesterolemia and does not have an elevated BMI (>=30).   69 year old female with a history of coronary artery disease status post hospitalization for STEMI June 6-8, diabetes, hyperlipidemia, hypertension, presents with concern for chest pain.  Blood pressure getting elevated at night, a lot of indigestion if eating or not eating Last night upper back started hurting, woke up this AM with chest pains and feeling like going to pass out. Took nitroglycerin and didn't ease up much (took 2) did improve after nitro in ambulance.  Felt lightheaded like going to pass out.  Denies associated shortness of breath. Felt nauseas, no vomiting.  Laid down and felt better doing that as wasn't so dizzy.  Pain not pleuritic or positional.  Pain radiated across chest. Was similar feeling that she had with the STEMI.  Light pressure still there 2/10.  Having palpitations off and on since leaving the hospital. Wilburn Mylar most recent.  Past  Medical History:  Diagnosis Date  . Anemia   . Anxiety   . CKD (chronic kidney disease)    CKD III per PCP notes-pt denies  . Congenital blindness   . Diabetes mellitus   . Diverticula of small intestine   . Esophageal stricture   . GERD (gastroesophageal reflux disease)   . H/O hiatal hernia   . Hyperlipidemia   . Hypertension   . Hypothyroidism   . Iron deficiency anemia   . Low magnesium level   . Low sodium levels   . Osteopenia   . Osteoporosis   . Postoperative nausea and vomiting   . Renal disease   . Retinitis   . SVT (supraventricular tachycardia) ([place])   . Vitamin B 12 deficiency     Patient Active Problem List   Diagnosis Date Noted  . Chest pain 01/13/2020  . Acute ST elevation myocardial infarction (STEMI) ([place]) 12/30/2019  . STEMI (ST elevation myocardial infarction) ([place]) 12/30/2019  . STEMI involving oth coronary artery of inferior wall ([place]) 12/30/2019  . Dysphagia 12/06/2017  . History of esophageal stricture 12/06/2017  . Nausea with vomiting 05/21/2013  . HYPOTHYROIDISM 11/15/2007  . DIABETES MELLITUS, TYPE II 11/15/2007  . DYSLIPIDEMIA 11/15/2007  . VISUAL IMPAIRMENT 11/15/2007  . ESOPHAGEAL STRICTURE 02/08/2007  . GASTROESOPHAGEAL REFLUX DISEASE 02/08/2007  . HIATAL HERNIA 02/08/2007    Past Surgical History:  Procedure Laterality Date  . BACK SURGERY    . CARPAL TUNNEL RELEASE     Right   . CESAREAN SECTION    . CHOLECYSTECTOMY  1985  .  COLONOSCOPY    . CORONARY BALLOON ANGIOPLASTY N/A 12/30/2019   Procedure: CORONARY BALLOON ANGIOPLASTY;  Surgeon: Troy Sine, MD;  Location: [facility];  Service: Cardiovascular;  Laterality: N/A;  . CORONARY/GRAFT ACUTE MI REVASCULARIZATION N/A 12/30/2019   Procedure: Coronary/Graft Acute MI Revascularization;  Surgeon: Troy Sine, MD;  Location: [facility];  Service: Cardiovascular;  Laterality: N/A;  . HERNIA REPAIR    . LEFT HEART CATH AND CORONARY ANGIOGRAPHY N/A 12/30/2019    Procedure: LEFT HEART CATH AND CORONARY ANGIOGRAPHY;  Surgeon: Troy Sine, MD;  Location: [facility];  Service: Cardiovascular;  Laterality: N/A;  . Right knee arthroscopy  1993  . Right thyroidectomy  2007  . TOTAL ABDOMINAL HYSTERECTOMY W/ BILATERAL SALPINGOOPHORECTOMY  1993  . UPPER GASTROINTESTINAL ENDOSCOPY       OB History   No obstetric history on file.     Family History  Problem Relation Age of Onset  . Coronary artery disease Father   . COPD Father   . CVA Mother   . Diabetes Mellitus II Mother   . Heart disease Mother   . Diabetes Mellitus II Maternal Grandmother   . Uterine cancer Sister   . Hypertension Sister   . Hypertension Sister        Brother also  . Hyperlipidemia Sister        Brother also  . Colon cancer Neg Hx   . Rectal cancer Neg Hx   . Stomach cancer Neg Hx   . Esophageal cancer Neg Hx     Social History   Tobacco Use  . Smoking status: Never Smoker  . Smokeless tobacco: Never Used  Vaping Use  . Vaping Use: Never used  Substance Use Topics  . Alcohol use: No  . Drug use: No    Home Medications Prior to Admission medications   Medication Sig Start Date End Date Taking? Authorizing Provider  aspirin EC 81 MG tablet Take 1 tablet (81 mg total) by mouth daily. 01/01/20 12/31/20 Yes Bhagat, Bhavinkumar, PA  carvedilol (COREG) 12.5 MG tablet Take 1 tablet (12.5 mg total) by mouth 2 (two) times daily with a meal. 01/09/20  Yes Lendon Colonel, NP  Coenzyme Q10 (CO Q 10) 100 MG CAPS Take 100 mg by mouth daily.    Yes [provider]  famotidine (PEPCID) 20 MG tablet Take 20 mg by mouth 2 (two) times daily.   Yes [provider]  glipiZIDE (GLUCOTROL) 10 MG tablet Take 10 mg by mouth 2 (two) times daily. 12/14/19  Yes [provider]  levothyroxine (SYNTHROID) 75 MCG tablet Take 75 mcg by mouth daily. 12/27/19  Yes [provider]  LORazepam (ATIVAN) 0.5 MG tablet Take 0.5 mg by mouth See admin  instructions. Take one tablet (0.5 mg) by mouth daily at bedtime, may also take one tablet (0.5 mg) during the day as needed for anxiety.   Yes [provider]  magnesium oxide (MAG-OX) 400 MG tablet Take 400 mg by mouth daily.   Yes [provider]  nitroGLYCERIN (NITROSTAT) 0.4 MG SL tablet Place 1 tablet (0.4 mg total) under the tongue every 5 (five) minutes x 3 doses as needed for chest pain. 01/01/20  Yes Bhagat, Bhavinkumar, PA  omeprazole (PRILOSEC) 40 MG capsule Take 40 mg by mouth 2 (two) times a day.   Yes [provider]  rosuvastatin (CRESTOR) 40 MG tablet Take 1 tablet (40 mg total) by mouth at bedtime.  01/01/20  Yes Bhagat, Bhavinkumar, PA  sertraline (ZOLOFT) 50 MG tablet Take 50 mg by mouth daily at 6 PM.  11/22/18  Yes [provider]  ticagrelor (BRILINTA) 90 MG TABS tablet Take 1 tablet (90 mg total) by mouth 2 (two) times daily. 01/01/20  Yes Bhagat, Bhavinkumar, PA  cyanocobalamin (,VITAMIN B-12,) 1000 MCG/ML injection Inject 1,000 mcg into the muscle every 30 (thirty) days. 11/02/19   [provider]  naproxen sodium (ALEVE) 220 MG tablet Take 220-440 mg by mouth 2 (two) times daily as needed (pain/headache).    [provider]    Allergies    Codeine, Erythromycin, and Levaquin [levofloxacin]  Review of Systems   Review of Systems  Constitutional: Negative for fever.  HENT: Negative for sore throat.   Eyes: Negative for visual disturbance.  Respiratory: Negative for cough and shortness of breath.   Cardiovascular: Positive for chest pain.  Gastrointestinal: Positive for nausea. Negative for abdominal pain.  Genitourinary: Negative for difficulty urinating.  Musculoskeletal: Negative for back pain (last night now resolved) and neck stiffness.  Skin: Negative for rash.  Neurological: Positive for light-headedness. Negative for syncope and headaches.    Physical Exam Updated Vital Signs BP 134/77   Pulse 71   Temp 97.9  F (36.6 C) (Oral)   Resp 15   Ht 5\' 1"  (1.549 m)   Wt 57.3 kg   SpO2 99%   BMI 23.88 kg/m   Physical Exam Vitals and nursing note reviewed.  Constitutional:      General: He is not in acute distress.    Appearance: He is well-developed. He is not diaphoretic.  HENT:     Head: Normocephalic and atraumatic.  Eyes:     Conjunctiva/sclera: Conjunctivae normal.  Cardiovascular:     Rate and Rhythm: Normal rate and regular rhythm.     Heart sounds: Normal heart sounds. No murmur heard.  No friction rub. No gallop.   Pulmonary:     Effort: Pulmonary effort is normal. No respiratory distress.     Breath sounds: Normal breath sounds. No wheezing or rales.  Abdominal:     General: There is no distension.     Palpations: Abdomen is soft.     Tenderness: There is no abdominal tenderness. There is no guarding.  Musculoskeletal:        General: No tenderness.     Cervical back: Normal range of motion.  Skin:    General: Skin is warm and dry.     Findings: No erythema or rash.  Neurological:     Mental Status: He is alert and oriented to person, place, and time.     ED Results / Procedures / Treatments   Labs (all labs ordered are listed, but only abnormal results are displayed) Labs Reviewed  BASIC METABOLIC PANEL - Abnormal; Notable for the following components:      Result Value   Sodium 130 (*)    Potassium 5.3 (*)    CO2 21 (*)    Glucose, Bld 191 (*)    Creatinine, Ser 1.94 (*)    GFR calc non Af Amer 26 (*)    GFR calc Af Amer 30 (*)    All other components within normal limits  CBC - Abnormal; Notable for the following components:   RBC 3.70 (*)    Hemoglobin 11.2 (*)    HCT 34.9 (*)    All other components within normal limits  TROPONIN I (HIGH SENSITIVITY) - Abnormal; Notable for  the following components:   Troponin I (High Sensitivity) 29 (*)    All other components within normal limits  TROPONIN I (HIGH SENSITIVITY) - Abnormal; Notable for the following  components:   Troponin I (High Sensitivity) 28 (*)    All other components within normal limits  SARS CORONAVIRUS 2 BY RT PCR (HOSPITAL ORDER, [place] [facility])    EKG None  Radiology DG Chest 2 View  Result Date: 01/13/2020 CLINICAL DATA:  Chest pain EXAM: CHEST - 2 VIEW COMPARISON:  December 30, 2019 FINDINGS: The known nodule in the lingula is again identified. This is been followed with CT imaging. A 6-12 month follow-up CT was recommended in the impression section of the August 01, 2019 CT scan. The heart, hila, mediastinum, lungs, and pleura are otherwise unremarkable. Previous vertebroplasty of an upper lumbar vertebral body. IMPRESSION: 1. The known lingular nodule is again identified. CT follow-up was recommended on the CT scan from August 01, 2019. 2. No acute abnormalities are identified. Electronically Signed   By: Dorise Bullion III M.D   On: 01/13/2020 10:17    Procedures Procedures (including critical care time)  Medications Ordered in ED Medications  isosorbide mononitrate (IMDUR) 24 hr tablet 15 mg (has no administration in time range)  amLODipine (NORVASC) tablet 5 mg (has no administration in time range)  sodium chloride flush (NS) 0.9 % injection 3 mL (3 mLs Intravenous Given 01/13/20 1057)    ED Course  I have reviewed the triage vital signs and the nursing notes.  Pertinent labs & imaging results that were available during my care of the patient were reviewed by me and considered in my medical decision making (see chart for details).    MDM Rules/Calculators/A&P HEAR Score: 68                        69 year old female with a history of coronary artery disease status post hospitalization for STEMI June 6-8, diabetes, hyperlipidemia, hypertension, presents with concern for chest pain.  EKG shows similar T wave inversions to most recent EKG.  No sign of pericarditis.  X-ray shows lingular nodule, which patient states she is aware of and also aware  of the recommended CT scan.  Hemoglobin 11.2.  Very mild hyperkalemia 5.3.  Troponin with mild elevation 28.  No shortness of breath, no hypoxia, no tachycardia, low suspicion for pulmonary embolus.  Low clinical suspicion for aortic dissection by history and exam.  Consulted cardiology given recent STEMI for evaluation.  Cardiology will admit for further care.    Final Clinical Impression(s) / ED Diagnoses Final diagnoses:  Chest pain, unspecified type    Rx / DC Orders ED Discharge Orders    None       Gareth Morgan, MD 01/13/20 1515

## 2020-01-13 NOTE — ED Notes (Signed)
Patient transported to X-ray 

## 2020-01-13 NOTE — H&P (Addendum)
Cardiology Admission History and Physical:   Patient ID: Jessica [name] MRN: [id number]; DOB: 1951-06-28   Admission date: 01/13/2020  Primary Care Provider: Marton Redwood, MD [facility] Cardiologist: Shelva Majestic, MD   Chief Complaint:  Chest pain   Patient Profile:   Jessica [name] is a 69 y.o. adult with a hx of HTN, HLD, hiatal hernia, GERD, DM, congenital blindness, CKD stage III per notes, anemia, anxiety, osteoporosis, SVT and recent STEMI s/p with balloon angioplasty only to the RPDA which was 100% stenosed, also with mild nonobstructive stenosis of 30% in the proximal LAD region of the first diagonal and septal perforating artery 12/30/2019.  History of Present Illness:   On that presentation, she had been feeling more short of breath with exertion for several days prior to presentation. She presented to the ED 12/30/2019 with complaints of mild nausea and shortness of breath with an EKG that showed NSR with concern for subtle ST elevation in leads II, three, aVF, V5 through V6. She was taken emergently to the Cath Lab which showed a 100% stenosis RPDA with balloon angioplasty only with restoration of TIMI-3 flow also noted to have mild nonobstructive stenosis of 30% in the proximal LAD region of the first diagonal and septal perforating artery. HST levels [place] at [address]. She was started on DAPT with ASA and Brilinta. Echocardiogram showed an LVEF at 55 to 60%, G1 DD with moderate akinesis of the left ventricular inferior wall, apical segment and inferior septal walls. She was started on low-dose beta-blocker. She did have issues with hematoma at cath site at which time her hemoglobin dropped from 12.9-9.8 and she was transfused with 2 units of PRBCs.  She was then seen in follow-up 6/16/2021and was doing well from a CV standpoint. Her BP was elevated therefore her carvedilol was increased to 12.5 mg p.o. twice daily with a planned 1 month follow-up. Amlodipine had been discontinued by  her PCP   Today, she presented to the ED with complaints of chest/epigastric pain with radiation to her upper back between her shoulder blades, similar to prior presenting symptoms two weeks ago with her STEMI. She states that her symptoms initially began yesterday evening. She went to lay down in hopes her pain would go away. Upon waking this morning, her pain persisted. Her husband gave her one SL NTG x3 with mild relief. Given her recent MI, she called EMS for transport to the ED for further evaluation.  On EMS arrival, she received one additional nitroglycerin tablet along with ASA 324 with moderate pain improvement. EKG  High-sensitivity troponin levels at 29 with repeat at 28, likely in the setting of recent MI. Peak on last hospitalization 13084. Creatinine elevated at 1.94 however lower than previous lab work at 2.22. With known lingular nodule previously identified on prior CXR otherwise no acute abnormalities noted.  Past Medical History:  Diagnosis Date  . Anemia   . Anxiety   . CKD (chronic kidney disease)    CKD III per PCP notes-pt denies  . Congenital blindness   . Diabetes mellitus   . Diverticula of small intestine   . Esophageal stricture   . GERD (gastroesophageal reflux disease)   . H/O hiatal hernia   . Hyperlipidemia   . Hypertension   . Hypothyroidism   . Iron deficiency anemia   . Low magnesium level   . Low sodium levels   . Osteopenia   . Osteoporosis   . Postoperative nausea and vomiting   .  Renal disease   . Retinitis   . SVT (supraventricular tachycardia) ([place])   . Vitamin B 12 deficiency     Past Surgical History:  Procedure Laterality Date  . BACK SURGERY    . CARPAL TUNNEL RELEASE     Right   . CESAREAN SECTION    . CHOLECYSTECTOMY  1985  . COLONOSCOPY    . CORONARY BALLOON ANGIOPLASTY N/A 12/30/2019   Procedure: CORONARY BALLOON ANGIOPLASTY;  Surgeon: Troy Sine, MD;  Location: [facility];  Service: Cardiovascular;  Laterality:  N/A;  . CORONARY/GRAFT ACUTE MI REVASCULARIZATION N/A 12/30/2019   Procedure: Coronary/Graft Acute MI Revascularization;  Surgeon: Troy Sine, MD;  Location: [facility];  Service: Cardiovascular;  Laterality: N/A;  . HERNIA REPAIR    . LEFT HEART CATH AND CORONARY ANGIOGRAPHY N/A 12/30/2019   Procedure: LEFT HEART CATH AND CORONARY ANGIOGRAPHY;  Surgeon: Troy Sine, MD;  Location: [facility];  Service: Cardiovascular;  Laterality: N/A;  . Right knee arthroscopy  1993  . Right thyroidectomy  2007  . TOTAL ABDOMINAL HYSTERECTOMY W/ BILATERAL SALPINGOOPHORECTOMY  1993  . UPPER GASTROINTESTINAL ENDOSCOPY       Medications Prior to Admission: Prior to Admission medications   Medication Sig Start Date End Date Taking? Authorizing Provider  aspirin EC 81 MG tablet Take 1 tablet (81 mg total) by mouth daily. 01/01/20 12/31/20 Yes Bhagat, Bhavinkumar, PA  carvedilol (COREG) 12.5 MG tablet Take 1 tablet (12.5 mg total) by mouth 2 (two) times daily with a meal. 01/09/20  Yes Lendon Colonel, NP  Coenzyme Q10 (CO Q 10) 100 MG CAPS Take 100 mg by mouth daily.    Yes [provider]  famotidine (PEPCID) 20 MG tablet Take 20 mg by mouth 2 (two) times daily.   Yes [provider]  glipiZIDE (GLUCOTROL) 10 MG tablet Take 10 mg by mouth 2 (two) times daily. 12/14/19  Yes [provider]  levothyroxine (SYNTHROID) 75 MCG tablet Take 75 mcg by mouth daily. 12/27/19  Yes [provider]  LORazepam (ATIVAN) 0.5 MG tablet Take 0.5 mg by mouth See admin instructions. Take one tablet (0.5 mg) by mouth daily at bedtime, may also take one tablet (0.5 mg) during the day as needed for anxiety.   Yes [provider]  magnesium oxide (MAG-OX) 400 MG tablet Take 400 mg by mouth daily.   Yes [provider]  nitroGLYCERIN (NITROSTAT) 0.4 MG SL tablet Place 1 tablet (0.4 mg total) under the tongue every 5 (five) minutes x 3 doses as needed for chest pain.  01/01/20  Yes Bhagat, Bhavinkumar, PA  omeprazole (PRILOSEC) 40 MG capsule Take 40 mg by mouth 2 (two) times a day.   Yes [provider]  rosuvastatin (CRESTOR) 40 MG tablet Take 1 tablet (40 mg total) by mouth at bedtime. 01/01/20  Yes Bhagat, Bhavinkumar, PA  sertraline (ZOLOFT) 50 MG tablet Take 50 mg by mouth daily at 6 PM.  11/22/18  Yes [provider]  ticagrelor (BRILINTA) 90 MG TABS tablet Take 1 tablet (90 mg total) by mouth 2 (two) times daily. 01/01/20  Yes Bhagat, Bhavinkumar, PA  cyanocobalamin (,VITAMIN B-12,) 1000 MCG/ML injection Inject 1,000 mcg into the muscle every 30 (thirty) days. 11/02/19   [provider]  naproxen sodium (ALEVE) 220 MG tablet Take 220-440 mg by mouth 2 (two) times daily as needed (pain/headache).    [provider]     Allergies:  Allergies  Allergen Reactions  . Codeine Shortness Of Breath and Nausea And Vomiting  . Erythromycin Nausea And Vomiting  . Levaquin [Levofloxacin] Shortness Of Breath    Social History:   Social History   Socioeconomic History  . Marital status: Married    Spouse name: Not on file  . Number of children: 4  . Years of education: Not on file  . Highest education level: Not on file  Occupational History  . Occupation: retired    Fish farm manager: [employer]  Tobacco Use  . Smoking status: Never Smoker  . Smokeless tobacco: Never Used  Vaping Use  . Vaping Use: Never used  Substance and Sexual Activity  . Alcohol use: No  . Drug use: No  . Sexual activity: Never  Other Topics Concern  . Not on file  Social History Narrative  . Not on file   Social Determinants of Health   Financial Resource Strain:   . Difficulty of Paying Living Expenses:   Food Insecurity:   . Worried About Charity fundraiser in the Last Year:   . Arboriculturist in the Last Year:   Transportation Needs:   . Film/video editor (Medical):   Marland Kitchen Lack of Transportation (Non-Medical):   Physical  Activity:   . Days of Exercise per Week:   . Minutes of Exercise per Session:   Stress:   . Feeling of Stress :   Social Connections:   . Frequency of Communication with Friends and Family:   . Frequency of Social Gatherings with Friends and Family:   . Attends Religious Services:   . Active Member of Clubs or Organizations:   . Attends Archivist Meetings:   Marland Kitchen Marital Status:   Intimate Partner Violence:   . Fear of Current or Ex-Partner:   . Emotionally Abused:   Marland Kitchen Physically Abused:   . Sexually Abused:     Family History:   The patient's family history includes COPD in his father; CVA in his mother; Coronary artery disease in his father; Diabetes Mellitus II in his maternal grandmother and mother; Heart disease in his mother; Hyperlipidemia in his sister; Hypertension in his sister and sister; Uterine cancer in his sister. There is no history of Colon cancer, Rectal cancer, Stomach cancer, or Esophageal cancer.    ROS:  Please see the history of present illness.  All other ROS reviewed and negative.     Physical Exam/Data:   Vitals:   01/13/20 1248 01/13/20 1249 01/13/20 1250 01/13/20 1251  BP:      Pulse: 70 71 72 71  Resp: 15 16 15 15   Temp:      TempSrc:      SpO2: 100% 99% 99% 99%  Weight:      Height:       No intake or output data in the 24 hours ending 01/13/20 1359 Last 3 Weights 01/13/2020 01/09/2020 01/01/2020  Weight (lbs) 126 lb 6.4 oz 128 lb 126 lb 12.2 oz  Weight (kg) 57.335 kg 58.06 kg 57.5 kg     Body mass index is 23.88 kg/m.   General: Well developed, well nourished, NAD Neck: Negative for carotid bruits. No JVD Lungs:Clear to ausculation bilaterally. No wheezes, rales, or rhonchi. Breathing is unlabored. Cardiovascular: RRR with S1 S2. No murmurs Abdomen: Soft, non-tender, non-distended. No obvious abdominal masses. Extremities: No edema. Radial pulses 2+ bilaterally Neuro: Alert and oriented. No focal deficits. No facial asymmetry. MAE  spontaneously. Psych:  Responds to questions appropriately with normal affect.     EKG:  The ECG that was done  was personally reviewed and demonstrates 01/13/20 NSR with ST change in anterior and inferior leads, consistent to prior MI.   Relevant CV Studies:  Echo 12/31/2019 1. Left ventricular ejection fraction, by estimation, is 50 to 55%. The  left ventricle has low normal function. The left ventricle demonstrates  regional wall motion abnormalities (see scoring diagram/findings for  description). There is mild left  ventricular hypertrophy. Left ventricular diastolic parameters are  consistent with Grade I diastolic dysfunction (impaired relaxation). There  is moderate hypokinesis of the left ventricular, entire inferior wall,  apical segment and inferoseptal wall.  2. Right ventricular systolic function is normal. The right ventricular  size is normal.  3. The mitral valve is grossly normal. No evidence of mitral valve  regurgitation.  4. The aortic valve is tricuspid. Aortic valve regurgitation is not  visualized.  5. The inferior vena cava is normal in size with greater than 50%  respiratory variability, suggesting right atrial pressure of 3 mmHg.    Coronary/Graft Acute MI Revascularization  12/30/2019  CORONARY BALLOON ANGIOPLASTY  LEFT HEART CATH AND CORONARY ANGIOGRAPHY  Conclusion    Prox LAD lesion is 30% stenosed.  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  Post intervention, there is a 5% residual stenosis.  Mild nonobstructive stenosis of 30% in the proximal LAD in the region of the first diagonal and septal perforating artery.  Normal ramus intermediate and left circumflex coronary arteries.  Dominant RCA with total mid distal occlusion of the PDA vessel.  LVEDP 22 mmHg.  RECOMMENDATION: DAPT initially but since no stenting was performed duration may be less than 1 year. Will DC bivalirudin several hours post procedure. Hydrate. Aggressive  lipid-lowering therapy with target LDL less than 70 and optimal blood pressure control.    Diagnostic Dominance: Right  Intervention      Laboratory Data:  High Sensitivity Troponin:   Recent Labs  Lab 12/30/19 1352 12/30/19 1527 12/30/19 2250 01/13/20 0924 01/13/20 1057  TROPONINIHS 11,353* 16,469* 13,084* 29* 28*      Chemistry Recent Labs  Lab 01/13/20 0924  NA 130*  K 5.3*  CL 100  CO2 21*  GLUCOSE 191*  BUN 21  CREATININE 1.94*  CALCIUM 9.1  GFRNONAA 26*  GFRAA 30*  ANIONGAP 9    No results for input(s): PROT, ALBUMIN, AST, ALT, ALKPHOS, BILITOT in the last 168 hours. Hematology Recent Labs  Lab 01/13/20 0924  WBC 9.9  RBC 3.70*  HGB 11.2*  HCT 34.9*  MCV 94.3  MCH 30.3  MCHC 32.1  RDW 13.5  PLT 363   BNPNo results for input(s): BNP, PROBNP in the last 168 hours.  DDimer No results for input(s): DDIMER in the last 168 hours.   Radiology/Studies:  DG Chest 2 View  Result Date: 01/13/2020 CLINICAL DATA:  Chest pain EXAM: CHEST - 2 VIEW COMPARISON:  December 30, 2019 FINDINGS: The known nodule in the lingula is again identified. This is been followed with CT imaging. A 6-12 month follow-up CT was recommended in the impression section of the August 01, 2019 CT scan. The heart, hila, mediastinum, lungs, and pleura are otherwise unremarkable. Previous vertebroplasty of an upper lumbar vertebral body. IMPRESSION: 1. The known lingular nodule is again identified. CT follow-up was recommended on the CT scan from August 01, 2019. 2. No acute abnormalities are identified. Electronically Signed   By: Dorise Bullion III M.D  On: 01/13/2020 10:17       HEAR Score (for undifferentiated chest pain):  HEAR Score: 5       Assessment and Plan:   1. Chest pain with recent STEMI: -Recently admitted and discharged 01/01/2020 s/p STEMI with 100% stenosed RPDA with balloon angioplasty only with restoration of TIMI-3 flow also with mild nonobstructive stenosis of  30% in the proximal LAD region of the first diagonal and septal perforating artery -Patient presented with chest pain which began yesterday evening and persisted intoday today, found to have mild relief with SL NTG. Pain consistent to prior MI however hsT levels have remained relatively low and flat. -Reports compliance with ASA and Brilinta therapies -Prior echocardiogram with LVEF at 50 to 55% and grade 1 DD, moderate hypokinesis of the LV, inferior wall, apical segment and inferior septal walls -Continue ASA, Brilinta, low-dose beta-blocker -Plan to add low dose amlodipine back to regimen along with Imdur 15mg  PO QD and monitor symptoms. RPDA which was previously 100% occluded>>felt to be too small for prior stenting. Low suspicion that PDA has once again blocked off therefore will focus on medical management at this time.   2. Hypertension: -Stable, 134/77>140/82>158/74 -Continue current regimen with carvedilol -Start amlodipine  -Stable EF on echo   3. HLD: -LDL 12/31/2019 with LDL at 47 -Continue Crestor 40 mg p.o. daily  4. CKD stage III: -Creatinine elevated at 1.94 however improved from lab work on 01/01/2020 -Most recent baseline appears to be in the 1.6-1.8 range -Avoid nephrotoxic medications -Follow with daily lab work  5. Anemia: -Previous hematoma after las cath  -Obtain CBC   Severity of Illness: The appropriate patient status for this patient is OBSERVATION. Observation status is judged to be reasonable and necessary in order to provide the required intensity of service to ensure the patient's safety. The patient's presenting symptoms, physical exam findings, and initial radiographic and laboratory data in the context of their medical condition is felt to place them at decreased risk for further clinical deterioration. Furthermore, it is anticipated that the patient will be medically stable for discharge from the hospital within 2 midnights of admission. The following  factors support the patient status of observation.   " The patient's presenting symptoms include cheest pain . " The physical exam findings include chest pain. " The initial radiographic and laboratory data are mild trop elevation .     For questions or updates, please contact [place] Please consult [email or web address] for contact info under     Signed, Kathyrn Drown, NP  01/13/2020 1:59 PM    Patient seen and examined.  Agree with above documentation.  Ms. Janace Hoard is a 69 year old female with a history of CAD status post recent STEMI with balloon angioplasty to RPDA, hypertension, hyperlipidemia, hiatal hernia, GERD, type 2 diabetes who presents to the ED today with chest pain.  She underwent cardiac catheterization on 12/30/2019 for inferior STEMI on 12/30/2019.  High-sensitivity troponin peaked at 164[address].  Cath showed RPDA was occluded.  Underwent balloon angioplasty to RPDA, no stent was placed given small vessel.  Otherwise had 30% proximal LAD lesion.  TTE on 12/31/2019 showed LVEF 50 to 55% with inferior hypokinesis.  She was discharged on aspirin, Brilinta, beta-blocker.  At follow-up visit, her carvedilol was increased to 12.5 mg twice daily.  Today she reported the onset of chest/epigastric pain which was similar to her prior symptoms 2 weeks ago when she had her STEMI.  Did improve with nitroglycerin.  In the ED, vital  signs notable for BP 117/78, pulse 76, SPO2 100% on room air.  EKG shows normal sinus rhythm, rate 74, T wave inversions in leads II, III, aVF, V3-6 (similar to prior EKG on 01/01/2020).  Labs were notable for high-sensitivity troponin 29 then 28, creatinine 1.94 (from 2.22 on 6/8), hemoglobin 11.2.  Telemetry shows sinus rhythm with rate 60s to 70s.  On exam, patient is alert and oriented, regular rate and rhythm, no murmurs, lungs CTAB, no LE edema or JVD.  For her chest pain, no evidence of MI with stable low troponins.  I do not think she would benefit from further  noninvasive testing, as her anatomy is known and if she is having ischemia, it would likely be from the RPDA for which underwent angioplasty but no stenting as it is a small vessel.  Even if she has developed ischemia from this vessel, treatment would be medical management as it is too small to stent.  Will admit for observation and titrate antianginal regimen.  Donato Heinz, MD

## 2020-01-14 ENCOUNTER — Encounter (HOSPITAL_COMMUNITY): Payer: Self-pay | Admitting: Cardiology

## 2020-01-14 DIAGNOSIS — I129 Hypertensive chronic kidney disease with stage 1 through stage 4 chronic kidney disease, or unspecified chronic kidney disease: Secondary | ICD-10-CM | POA: Diagnosis not present

## 2020-01-14 DIAGNOSIS — R0789 Other chest pain: Secondary | ICD-10-CM | POA: Diagnosis not present

## 2020-01-14 DIAGNOSIS — E118 Type 2 diabetes mellitus with unspecified complications: Secondary | ICD-10-CM | POA: Diagnosis not present

## 2020-01-14 DIAGNOSIS — D649 Anemia, unspecified: Secondary | ICD-10-CM | POA: Diagnosis not present

## 2020-01-14 DIAGNOSIS — F419 Anxiety disorder, unspecified: Secondary | ICD-10-CM

## 2020-01-14 DIAGNOSIS — I2109 ST elevation (STEMI) myocardial infarction involving other coronary artery of anterior wall: Secondary | ICD-10-CM | POA: Diagnosis not present

## 2020-01-14 DIAGNOSIS — N183 Chronic kidney disease, stage 3 unspecified: Secondary | ICD-10-CM | POA: Diagnosis not present

## 2020-01-14 DIAGNOSIS — I2119 ST elevation (STEMI) myocardial infarction involving other coronary artery of inferior wall: Secondary | ICD-10-CM | POA: Diagnosis not present

## 2020-01-14 DIAGNOSIS — I10 Essential (primary) hypertension: Secondary | ICD-10-CM

## 2020-01-14 DIAGNOSIS — E785 Hyperlipidemia, unspecified: Secondary | ICD-10-CM | POA: Diagnosis not present

## 2020-01-14 DIAGNOSIS — N184 Chronic kidney disease, stage 4 (severe): Secondary | ICD-10-CM

## 2020-01-14 DIAGNOSIS — Z20822 Contact with and (suspected) exposure to covid-19: Secondary | ICD-10-CM | POA: Diagnosis not present

## 2020-01-14 DIAGNOSIS — K219 Gastro-esophageal reflux disease without esophagitis: Secondary | ICD-10-CM | POA: Diagnosis not present

## 2020-01-14 LAB — BASIC METABOLIC PANEL
Anion gap: 8 (ref 5–15)
BUN: 23 mg/dL (ref 8–23)
CO2: 24 mmol/L (ref 22–32)
Calcium: 9.1 mg/dL (ref 8.9–10.3)
Chloride: 103 mmol/L (ref 98–111)
Creatinine, Ser: 2.07 mg/dL — ABNORMAL HIGH (ref 0.44–1.00)
GFR calc Af Amer: 27 mL/min — ABNORMAL LOW (ref >60)
GFR calc non Af Amer: 24 mL/min — ABNORMAL LOW (ref >60)
Glucose, Bld: 122 mg/dL — ABNORMAL HIGH (ref 70–99)
Potassium: 4.2 mmol/L (ref 3.5–5.1)
Sodium: 135 mmol/L (ref 135–145)

## 2020-01-14 LAB — CBC
HCT: 32 % — ABNORMAL LOW (ref 36.0–46.0)
Hemoglobin: 10.6 g/dL — ABNORMAL LOW (ref 12.0–15.0)
MCH: 30.4 pg (ref 26.0–34.0)
MCHC: 33.1 g/dL (ref 30.0–36.0)
MCV: 91.7 fL (ref 80.0–100.0)
Platelets: 318 10*3/uL (ref 150–400)
RBC: 3.49 MIL/uL — ABNORMAL LOW (ref 3.87–5.11)
RDW: 13.8 % (ref 11.5–15.5)
WBC: 7.1 10*3/uL (ref 4.0–10.5)
nRBC: 0 % (ref 0.0–0.2)

## 2020-01-14 LAB — GLUCOSE, CAPILLARY
Glucose-Capillary: 142 mg/dL — ABNORMAL HIGH (ref 70–99)
Glucose-Capillary: 152 mg/dL — ABNORMAL HIGH (ref 70–99)

## 2020-01-14 MED ORDER — ISOSORBIDE MONONITRATE ER 30 MG PO TB24: 15.0000 mg | ORAL_TABLET | Freq: Every day | ORAL | 3 refills | Status: DC

## 2020-01-14 MED ORDER — PANTOPRAZOLE SODIUM 40 MG PO TBEC
40.0000 mg | DELAYED_RELEASE_TABLET | Freq: Every day | ORAL | 3 refills | Status: DC
Start: 2020-01-14 — End: 2020-06-11

## 2020-01-14 MED ORDER — AMLODIPINE BESYLATE 5 MG PO TABS: 5.0000 mg | ORAL_TABLET | Freq: Every day | ORAL | 3 refills | Status: DC

## 2020-01-14 MED FILL — ISOSORBIDE MN ER 30 MG TAB: 30 | 90 days supply | Qty: 45 | Fill #0

## 2020-01-14 MED FILL — PANTOPRAZOLE SOD DR 40 MG T: 40 | 90 days supply | Qty: 90 | Fill #0

## 2020-01-14 MED FILL — AMLODIPINE BESYLATE 5 MG TA: 5 | 90 days supply | Qty: 90 | Fill #0

## 2020-01-14 NOTE — Discharge Summary (Addendum)
Discharge Summary    Patient ID: Jessica [name] MRN: [id number]; DOB: [birth year]  Admit date: 01/13/2020 Discharge date: 01/14/2020  Primary Care Provider: Marton Redwood, MD  Primary Cardiologist: Shelva Majestic, MD  Primary Electrophysiologist:  None   Discharge Diagnoses    Principal Problem:   Chest pain Active Problems:   Type 2 diabetes mellitus with complication, without long-term current use of insulin ([place])   Hyperlipidemia LDL goal <70   GASTROESOPHAGEAL REFLUX DISEASE   STEMI involving oth coronary artery of inferior wall (HCC)   Hypertension   CKD (chronic kidney disease), stage III   Anemia   Anxiety    Diagnostic Studies/Procedures    Left heart catheterization 12/30/19:  Prox LAD lesion is 30% stenosed.  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  Post intervention, there is a 5% residual stenosis.   Mild nonobstructive stenosis of 30% in the proximal LAD in the region of the first diagonal and septal perforating artery.  Normal ramus intermediate and left circumflex coronary arteries.  Dominant RCA with total mid distal occlusion of the PDA vessel.  LVEDP 22 mmHg.  RECOMMENDATION: DAPT initially but since no stenting was performed duration may be less than 1 year.  Will DC bivalirudin several hours post procedure.  Hydrate.  Aggressive lipid-lowering therapy with target LDL less than 70 and optimal blood pressure control.  _____________   Echo 12/31/19: 1. Left ventricular ejection fraction, by estimation, is 50 to 55%. The  left ventricle has low normal function. The left ventricle demonstrates  regional wall motion abnormalities (see scoring diagram/findings for  description). There is mild left  ventricular hypertrophy. Left ventricular diastolic parameters are  consistent with Grade I diastolic dysfunction (impaired relaxation). There  is moderate hypokinesis of the left ventricular, entire inferior wall,  apical segment and inferoseptal wall.  2. Right  ventricular systolic function is normal. The right ventricular  size is normal.  3. The mitral valve is grossly normal. No evidence of mitral valve  regurgitation.  4. The aortic valve is tricuspid. Aortic valve regurgitation is not  visualized.  5. The inferior vena cava is normal in size with greater than 50%  respiratory variability, suggesting right atrial pressure of 3 mmHg.    History of Present Illness     Jessica [name] is a 69 y.o. adult with a hx ofHTN, HLD, hiatal hernia, GERD, DM, congenital blindness, CKD stage III per notes, anemia, anxiety, osteoporosis, SVTand recent STEMI s/p with balloon angioplasty only to the RPDA which was 100% stenosed, also with mild nonobstructive stenosis of 30% in the proximal LAD region of the first diagonal and septal perforating artery 12/30/2019.  On that presentation, she had been feeling more short of breath with exertion for several days prior to presentation. She presented to the ED 12/30/2019 with complaints of mild nausea and shortness of breath with an EKG that showed NSR with concern for subtle ST elevation in leads II, III, aVF, V5 through V6. She was taken emergently to the Cath Lab which showed a 100% stenosis RPDA with balloon angioplasty only with restoration of TIMI-3 flow also noted to have mild nonobstructive stenosis of 30% in the proximal LAD region of the first diagonal and septal perforating artery. [place]ST [place] at [address]. She was started on DAPT with ASA and Brilinta. Echocardiogram showed an LVEF at 55 to 60%, G1 DD with moderate akinesis of the left ventricular inferior wall, apical segment and inferior septal walls. She was started on low-dose beta-blocker. She did  have issues with hematoma at cath site at which time her hemoglobin dropped from 12.9-9.8 and she was transfused with 2 units of PRBCs.  She was then seen in follow-up 01/09/2020 and was doing well from a CV standpoint. Her BP was elevated therefore her carvedilol  was increased to 12.5 mg p.o. twice daily with a planned 1 month follow-up. Amlodipine had been discontinued by her PCP   She presented to the ED 01/13/20 with complaints of chest/epigastric pain with radiation to her upper back between her shoulder blades, similar to prior presenting symptoms two weeks ago with her STEMI. She states that her symptoms initially began yesterday evening. She went to lay down in hopes her pain would go away. Upon waking this morning, her pain persisted. Her husband gave her one SL NTG x3 with mild relief. Given her recent MI, she called EMS for transport to the ED for further evaluation.  On EMS arrival, she received one additional nitroglycerin tablet along with ASA 324 with moderate pain improvement.   High-sensitivity troponin levels at 29 with repeat at 28, likely in the setting of recent MI. Peak on last hospitalization 13084. Creatinine elevated at 1.94 however lower than previous lab work at 2.22. With known lingular nodule previously identified on prior CXR otherwise no acute abnormalities noted.  Hospital Course     Consultants: none  CAD  Recent STEMI with POBA to RPDA Given her symptoms and recent STEMI, she was admitted to cardiology service for observation. She had mild relief with SL nitro. HS troponin levels remained low and flat. She was compliant with DAPT and low dose BB. Plan for medical management by uptitrating anti-anginals. Amlodipine 5 mg and 15 mg imdur started. She was pain-free overnight. She ambulated in the hall without chest pain.     Hypertension Pressure well-controlled with addition of amlodipine and low dose imdur.    Hyperlipidemia 12/31/2019: Cholesterol 116; HDL 26; LDL Cholesterol 47; Triglycerides 216; VLDL 43 Continue crestor 40 mg.  Will need fasting lipids 6 weeks from from 12/31/19   DM Continue home regimen   CKD stage III - discharge creatinine 2.07 - baseline appears to be 1.6-1.8 - will need a BMP at follow  up    Did the patient have an acute coronary syndrome (MI, NSTEMI, STEMI, etc) this admission?:  No.   The elevated Troponin was due to the acute medical illness (demand ischemia).  _____________  Discharge Vitals Blood pressure 112/62, pulse 68, temperature 97.9 F (36.6 C), temperature source Oral, resp. rate 15, height 5\' 1"  (1.549 m), weight 56.8 kg, SpO2 95 %.  Filed Weights   01/13/20 1058 01/14/20 0426  Weight: 57.3 kg 56.8 kg   Physical Exam Constitutional:      Appearance: Normal appearance.  HENT:     Head: Normocephalic.  Neck:     Vascular: No carotid bruit.  Cardiovascular:     Rate and Rhythm: Normal rate and regular rhythm.     Heart sounds: No murmur heard.   Pulmonary:     Effort: Pulmonary effort is normal.     Breath sounds: Normal breath sounds.  Abdominal:     General: Bowel sounds are normal.  Musculoskeletal:        General: No swelling.  Neurological:     Mental Status: He is alert.  Psychiatric:        Mood and Affect: Mood normal.    Labs & Radiologic Studies    CBC Recent Labs  01/13/20 0924 01/14/20 0504  WBC 9.9 7.1  HGB 11.2* 10.6*  HCT 34.9* 32.0*  MCV 94.3 91.7  PLT 363 099   Basic Metabolic Panel Recent Labs    01/13/20 0924 01/14/20 0504  NA 130* 135  K 5.3* 4.2  CL 100 103  CO2 21* 24  GLUCOSE 191* 122*  BUN 21 23  CREATININE 1.94* 2.07*  CALCIUM 9.1 9.1   Liver Function Tests No results for input(s): AST, ALT, ALKPHOS, BILITOT, PROT, ALBUMIN in the last 72 hours. No results for input(s): LIPASE, AMYLASE in the last 72 hours. High Sensitivity Troponin:   Recent Labs  Lab 12/30/19 1352 12/30/19 1527 12/30/19 2250 01/13/20 0924 01/13/20 1057  TROPONINIHS 11,353* 16,469* 13,084* 29* 28*    BNP Invalid input(s): POCBNP D-Dimer No results for input(s): DDIMER in the last 72 hours. Hemoglobin A1C No results for input(s): HGBA1C in the last 72 hours. Fasting Lipid Panel No results for input(s): CHOL,  HDL, LDLCALC, TRIG, CHOLHDL, LDLDIRECT in the last 72 hours. Thyroid Function Tests No results for input(s): TSH, T4TOTAL, T3FREE, THYROIDAB in the last 72 hours.  Invalid input(s): FREET3 _____________  DG Chest 2 View  Result Date: 01/13/2020 CLINICAL DATA:  Chest pain EXAM: CHEST - 2 VIEW COMPARISON:  December 30, 2019 FINDINGS: The known nodule in the lingula is again identified. This is been followed with CT imaging. A 6-12 month follow-up CT was recommended in the impression section of the August 01, 2019 CT scan. The heart, hila, mediastinum, lungs, and pleura are otherwise unremarkable. Previous vertebroplasty of an upper lumbar vertebral body. IMPRESSION: 1. The known lingular nodule is again identified. CT follow-up was recommended on the CT scan from August 01, 2019. 2. No acute abnormalities are identified. Electronically Signed   By: Dorise Bullion III M.D   On: 01/13/2020 10:17   CARDIAC CATHETERIZATION  Result Date: 12/30/2019  Prox LAD lesion is 30% stenosed.  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  Post intervention, there is a 5% residual stenosis.  Mild nonobstructive stenosis of 30% in the proximal LAD in the region of the first diagonal and septal perforating artery. Normal ramus intermediate and left circumflex coronary arteries. Dominant RCA with total mid distal occlusion of the PDA vessel. LVEDP 22 mmHg. RECOMMENDATION: DAPT initially but since no stenting was performed duration may be less than 1 year.  Will DC bivalirudin several hours post procedure.  Hydrate.  Aggressive lipid-lowering therapy with target LDL less than 70 and optimal blood pressure control.   DG Chest Portable 1 View  Result Date: 12/30/2019 CLINICAL DATA:  Onset of coughing this morning, central chest pain, RIGHT chest pain radiating across back, shortness of breath, STEMI EXAM: PORTABLE CHEST 1 VIEW COMPARISON:  Portable exam 1149 hours compared to 09/21/2017 FINDINGS: Normal heart size, mediastinal contours, and  pulmonary vascularity. Minimal atelectasis or infiltrate at LEFT base. Lungs otherwise clear. No pulmonary infiltrate, pleural effusion or pneumothorax. Surgical clips RIGHT cervical region question prior thyroid surgery. Bones demineralized. IMPRESSION: Minimal LEFT basilar atelectasis versus infiltrate. Electronically Signed   By: Lavonia Dana M.D.   On: 12/30/2019 12:03   ECHOCARDIOGRAM COMPLETE  Result Date: 12/31/2019    ECHOCARDIOGRAM REPORT   Patient Name:   Jessica [name] Date of Exam: 12/31/2019 Medical Rec #:  [id number]     Height:       61.5 in Accession #:    [id number]    Weight:       132.3 lb Date of Birth:  Mar 01, 1951    BSA:          1.594 m Patient Age:    68 years      BP:           117/62 mmHg Patient Gender: F             HR:           79 bpm. Exam Location:  Inpatient Procedure: 2D Echo, Cardiac Doppler and Color Doppler Indications:    STEMI I21.3  History:        Patient has no prior history of Echocardiogram examinations.                 Risk Factors:Hypertension, Diabetes and Dyslipidemia.                 Hypothyroidism. GERD. CKD.  Sonographer:    Jonelle Sidle Dance Referring Phys: 33 [place]  1. Left ventricular ejection fraction, by estimation, is 50 to 55%. The left ventricle has low normal function. The left ventricle demonstrates regional wall motion abnormalities (see scoring diagram/findings for description). There is mild left ventricular hypertrophy. Left ventricular diastolic parameters are consistent with Grade I diastolic dysfunction (impaired relaxation). There is moderate hypokinesis of the left ventricular, entire inferior wall, apical segment and inferoseptal wall.  2. Right ventricular systolic function is normal. The right ventricular size is normal.  3. The mitral valve is grossly normal. No evidence of mitral valve regurgitation.  4. The aortic valve is tricuspid. Aortic valve regurgitation is not visualized.  5. The inferior vena cava is normal in size  with greater than 50% respiratory variability, suggesting right atrial pressure of 3 mmHg. FINDINGS  Left Ventricle: Left ventricular ejection fraction, by estimation, is 50 to 55%. The left ventricle has low normal function. The left ventricle demonstrates regional wall motion abnormalities. Moderate hypokinesis of the left ventricular, entire inferior wall, apical segment and inferoseptal wall. The left ventricular internal cavity size was normal in size. There is mild left ventricular hypertrophy. Left ventricular diastolic parameters are consistent with Grade I diastolic dysfunction (impaired relaxation). Indeterminate filling pressures. Right Ventricle: The right ventricular size is normal. No increase in right ventricular wall thickness. Right ventricular systolic function is normal. Left Atrium: Left atrial size was normal in size. Right Atrium: Right atrial size was normal in size. Pericardium: There is no evidence of pericardial effusion. Mitral Valve: The mitral valve is grossly normal. No evidence of mitral valve regurgitation. Tricuspid Valve: The tricuspid valve is grossly normal. Tricuspid valve regurgitation is trivial. Aortic Valve: The aortic valve is tricuspid. Aortic valve regurgitation is not visualized. Pulmonic Valve: The pulmonic valve was normal in structure. Pulmonic valve regurgitation is not visualized. Aorta: The aortic root and ascending aorta are structurally normal, with no evidence of dilitation. Venous: The inferior vena cava is normal in size with greater than 50% respiratory variability, suggesting right atrial pressure of 3 mmHg. IAS/Shunts: No atrial level shunt detected by color flow Doppler.  LEFT VENTRICLE PLAX 2D LVIDd:         4.09 cm  Diastology LVIDs:         2.96 cm  LV e' lateral:   4.79 cm/s LV PW:         0.92 cm  LV E/e' lateral: 13.3 LV IVS:        1.02 cm  LV e' medial:    4.35 cm/s LVOT diam:     1.80 cm  LV E/e' medial:  14.6 LV SV:         46 LV SV Index:   29  LVOT Area:     2.54 cm  RIGHT VENTRICLE            IVC RV Basal diam:  2.13 cm    IVC diam: 1.30 cm RV S prime:     8.05 cm/s TAPSE (M-mode): 1.7 cm LEFT ATRIUM             Index       RIGHT ATRIUM          Index LA diam:        3.60 cm 2.26 cm/m  RA Area:     6.53 cm LA Vol (A2C):   36.8 ml 23.09 ml/m RA Volume:   10.00 ml 6.27 ml/m LA Vol (A4C):   22.6 ml 14.18 ml/m LA Biplane Vol: 29.1 ml 18.26 ml/m  AORTIC VALVE LVOT Vmax:   90.90 cm/s LVOT Vmean:  55.900 cm/s LVOT VTI:    0.180 m  AORTA Ao Root diam: 2.60 cm Ao Asc diam:  2.80 cm MITRAL VALVE MV Area (PHT): 3.17 cm     SHUNTS MV Decel Time: 239 msec     Systemic VTI:  0.18 m MV E velocity: 63.50 cm/s   Systemic Diam: 1.80 cm MV A velocity: 112.00 cm/s MV E/A ratio:  0.57 Lyman Bishop MD Electronically signed by Lyman Bishop MD Signature Date/Time: 12/31/2019/10:43:02 AM    Final    Disposition   Pt is being discharged home today in good condition.  Follow-up Plans & Appointments     Follow-up Information    Lendon Colonel, NP Follow up on 02/06/2020.   Specialties: Nurse Practitioner, Radiology, Cardiology Why: 2:45 Contact information: [address] [place] [address] [phone]                Discharge Medications   Allergies as of 01/14/2020      Reactions   Codeine Shortness Of Breath, Nausea And Vomiting   Erythromycin Nausea And Vomiting   Levaquin [levofloxacin] Shortness Of Breath      Medication List    STOP taking these medications   naproxen sodium 220 MG tablet Commonly known as: ALEVE   omeprazole 40 MG capsule Commonly known as: PRILOSEC     TAKE these medications   amLODipine 5 MG tablet Commonly known as: NORVASC Take 1 tablet (5 mg total) by mouth daily. Start taking on: January 15, 2020   aspirin EC 81 MG tablet Take 1 tablet (81 mg total) by mouth daily.   carvedilol 12.5 MG tablet Commonly known as: COREG Take 1 tablet (12.5 mg total) by mouth 2 (two) times  daily with a meal.   Co Q 10 100 MG Caps Take 100 mg by mouth daily.   cyanocobalamin 1000 MCG/ML injection Commonly known as: (VITAMIN B-12) Inject 1,000 mcg into the muscle every 30 (thirty) days.   famotidine 20 MG tablet Commonly known as: PEPCID Take 20 mg by mouth 2 (two) times daily.   glipiZIDE 10 MG tablet Commonly known as: GLUCOTROL Take 10 mg by mouth 2 (two) times daily.   isosorbide mononitrate 30 MG 24 hr tablet Commonly known as: IMDUR Take 0.5 tablets (15 mg total) by mouth daily. Start taking on: January 15, 2020   levothyroxine 75 MCG tablet Commonly known as: SYNTHROID Take 75 mcg by mouth daily.   LORazepam 0.5 MG tablet Commonly known as: ATIVAN Take 0.5  mg by mouth See admin instructions. Take one tablet (0.5 mg) by mouth daily at bedtime, may also take one tablet (0.5 mg) during the day as needed for anxiety.   magnesium oxide 400 MG tablet Commonly known as: MAG-OX Take 400 mg by mouth daily.   nitroGLYCERIN 0.4 MG SL tablet Commonly known as: NITROSTAT Place 1 tablet (0.4 mg total) under the tongue every 5 (five) minutes x 3 doses as needed for chest pain.   pantoprazole 40 MG tablet Commonly known as: Protonix Take 1 tablet (40 mg total) by mouth daily.   rosuvastatin 40 MG tablet Commonly known as: CRESTOR Take 1 tablet (40 mg total) by mouth at bedtime.   sertraline 50 MG tablet Commonly known as: ZOLOFT Take 50 mg by mouth daily at 6 PM.   ticagrelor 90 MG Tabs tablet Commonly known as: BRILINTA Take 1 tablet (90 mg total) by mouth 2 (two) times daily.          Outstanding Labs/Studies   BMP at next follow up in 3 weeks  Lipids in 4 weeks  Duration of Discharge Encounter   Greater than 30 minutes including physician time.  Signed, Morgan, PA 01/14/2020, 11:07 AM

## 2020-01-18 ENCOUNTER — Telehealth: Payer: Self-pay | Admitting: Adult Health

## 2020-01-18 DIAGNOSIS — E785 Hyperlipidemia, unspecified: Secondary | ICD-10-CM | POA: Diagnosis not present

## 2020-01-18 DIAGNOSIS — I2510 Atherosclerotic heart disease of native coronary artery without angina pectoris: Secondary | ICD-10-CM | POA: Diagnosis not present

## 2020-01-18 DIAGNOSIS — N184 Chronic kidney disease, stage 4 (severe): Secondary | ICD-10-CM | POA: Diagnosis not present

## 2020-01-18 DIAGNOSIS — E1129 Type 2 diabetes mellitus with other diabetic kidney complication: Secondary | ICD-10-CM | POA: Diagnosis not present

## 2020-01-18 DIAGNOSIS — I129 Hypertensive chronic kidney disease with stage 1 through stage 4 chronic kidney disease, or unspecified chronic kidney disease: Secondary | ICD-10-CM | POA: Diagnosis not present

## 2020-01-18 NOTE — Telephone Encounter (Signed)
Anderson Malta returning call.

## 2020-01-18 NOTE — Telephone Encounter (Signed)
Anderson Malta from [facility] called and stated that the patient does not have transportation for cardiac rehab to [facility]. Please call back ASAP to [facility] to [phone] or to [facility], NP [phone]. Also need to discuss some other things about the patient as well.

## 2020-01-18 NOTE — Telephone Encounter (Signed)
Spoke to Jessica [name] with [employer] she was calling to let Dr.Kelly know patient does not have transportation to [facility] cardiac rehab.Advised I will send message to our social worker Harlow Ohms to see if she can help.

## 2020-01-18 NOTE — Telephone Encounter (Signed)
Returned call to [employer] with [facility].

## 2020-01-22 ENCOUNTER — Telehealth: Payer: Self-pay | Admitting: Licensed Clinical Social Worker

## 2020-01-22 NOTE — Telephone Encounter (Signed)
CSW received referral to assist patient with transportation to cardiac rehab. Patient reports she lives in [place] but has a Mining engineer address which is often confused to be in [place] encouraged patient to notify [facility] [phone] and inquire about transportation options to cardiac rehab. Patient provided number and states she will follow up and return call to CSW if needed. Raquel Sarna, [place], [place]

## 2020-01-25 ENCOUNTER — Telehealth: Payer: Self-pay | Admitting: Licensed Clinical Social Worker

## 2020-01-25 NOTE — Telephone Encounter (Signed)
CSW received call form patient stating that she has made several attempts to reach [facility] with no success. CSW contacted [facility] and spoke with dispatch who will discuss transport request with [place]. CSW will await return call expected early next week. Raquel Sarna, [place], [place]

## 2020-01-26 ENCOUNTER — Telehealth: Payer: Self-pay | Admitting: Cardiology

## 2020-01-26 NOTE — Telephone Encounter (Signed)
Pt called with weakness and BP 69 systolic - she drank a bottle of water and BP now 370 systolic.  She had taken her meds today.   For evening meds if BP > 488 systolic she will take only 6.25 of coreg.  In AM if BP <120 she will call us for further instructions

## 2020-01-30 DIAGNOSIS — D649 Anemia, unspecified: Secondary | ICD-10-CM | POA: Diagnosis not present

## 2020-01-30 DIAGNOSIS — I252 Old myocardial infarction: Secondary | ICD-10-CM | POA: Diagnosis not present

## 2020-01-30 DIAGNOSIS — N184 Chronic kidney disease, stage 4 (severe): Secondary | ICD-10-CM | POA: Diagnosis not present

## 2020-01-30 DIAGNOSIS — I129 Hypertensive chronic kidney disease with stage 1 through stage 4 chronic kidney disease, or unspecified chronic kidney disease: Secondary | ICD-10-CM | POA: Diagnosis not present

## 2020-01-30 DIAGNOSIS — Z23 Encounter for immunization: Secondary | ICD-10-CM | POA: Diagnosis not present

## 2020-01-30 DIAGNOSIS — E871 Hypo-osmolality and hyponatremia: Secondary | ICD-10-CM | POA: Diagnosis not present

## 2020-01-30 DIAGNOSIS — Z9861 Coronary angioplasty status: Secondary | ICD-10-CM | POA: Diagnosis not present

## 2020-01-30 DIAGNOSIS — E1129 Type 2 diabetes mellitus with other diabetic kidney complication: Secondary | ICD-10-CM | POA: Diagnosis not present

## 2020-01-30 DIAGNOSIS — E785 Hyperlipidemia, unspecified: Secondary | ICD-10-CM | POA: Diagnosis not present

## 2020-02-05 NOTE — Progress Notes (Signed)
Cardiology Office Note   Date:  02/06/2020   ID:  [name], DOB 11-12-1950, MRN [id number]  PCP:  Jessica Redwood, MD  Cardiologist:  Dr. Claiborne Billings  CC: [facility] Follow Up   History of Present Illness: Jessica [name] is a 69 y.o. adult who presents for posthospitalization follow-up.  She presented to the emergency room on 01/13/2020 with complaints of worsening shortness of breath, and mild nausea.  There was some concern on her EKG for subtle ST elevation in leads II, III and aVF along with V5 to V6.  She was taken emergently to Cath Lab.  Cath revealed 1% stenosis of the RPDA was balloon angioplasty only with restoration of TIMI-3 flow.  She was also noted to have mild nonobstructive stenosis of 30% in the proximal LAD region of the first diagonal and septal perforating artery.  Post procedure she did have a hematoma at the cath insertion site, with decreasing hemoglobin from 12.9-9.8 progressively and therefore she was transfused 2 units of packed RBCs.  She was placed on dual antiplatelet therapy with aspirin and Brilinta.  Echocardiogram was performed which revealed an LVEF of 55 to 60% with grade 1 diastolic dysfunction with moderate akinesis of the left ventricle inferior wall, apical segment and inferior septal walls.  She was started on low-dose beta-blocker.   Due to hypertension, the patient was started on amlodipine 5 mg daily and 50 mg of isosorbide mononitrate.  Day of discharge she was without any complaints of chest discomfort or shortness of breath.  She was to continue Crestor 40 mg daily with a goal of LDL less than 70.  She is noted to have an LDL level of 47 during hospitalization, with triglycerides 216, total cholesterol of 116 with HDL of 26.  Unfortunately, the patient was readmitted to the hospital after having sudden onset of shortness of breath on 01/13/2020, with associated chest and epigastric pain with radiation to her upper back between her shoulder blades similar to  prior presenting symptoms 2 weeks earlier.  Given her symptoms, the patient was admitted for observation and plan for medical management by of treating antianginals.  Amlodipine 5 mg and Imdur 15 mg were started.  Unfortunately her blood pressure dropped shortly after returning home and she called her office for advisement on 01/26/2020.  She was complaining of weakness.  She drank some water and put her feet up.  Per Jessica Kicks, NP, amlodipine was decreased to 2.5 mg daily and Coreg was decreased to 6.25 mg twice daily.  She comes today feeling well.  No further discomfort or shortness of breath.  She denies any pain at cath site or groin site where previous hematoma had been noted.  Blood pressure is much better without associated fatigue or weakness.  She had Moderna vaccine a few days ago.  She does have a bit of a cough but no fever or chills or muscle aches and pains.  Past Medical History:  Diagnosis Date  . Anemia   . Anxiety   . CKD (chronic kidney disease)    CKD III per PCP notes-pt denies  . Congenital blindness   . Diabetes mellitus   . Diverticula of small intestine   . Esophageal stricture   . GERD (gastroesophageal reflux disease)   . H/O hiatal hernia   . Hyperlipidemia   . Hypertension   . Hypothyroidism   . Iron deficiency anemia   . Low magnesium level   . Low sodium levels   .  Osteopenia   . Osteoporosis   . Postoperative nausea and vomiting   . Renal disease   . Retinitis   . STEMI (ST elevation myocardial infarction) ([place])   . SVT (supraventricular tachycardia) ([place])   . Vitamin B 12 deficiency     Past Surgical History:  Procedure Laterality Date  . BACK SURGERY    . CARPAL TUNNEL RELEASE     Right   . CESAREAN SECTION    . CHOLECYSTECTOMY  1985  . COLONOSCOPY    . CORONARY BALLOON ANGIOPLASTY N/A 12/30/2019   Procedure: CORONARY BALLOON ANGIOPLASTY;  Surgeon: Troy Sine, MD;  Location: [place] [facility];  Service: Cardiovascular;  Laterality:  N/A;  . CORONARY/GRAFT ACUTE MI REVASCULARIZATION N/A 12/30/2019   Procedure: Coronary/Graft Acute MI Revascularization;  Surgeon: Troy Sine, MD;  Location: [place] [facility];  Service: Cardiovascular;  Laterality: N/A;  . HERNIA REPAIR    . LEFT HEART CATH AND CORONARY ANGIOGRAPHY N/A 12/30/2019   Procedure: LEFT HEART CATH AND CORONARY ANGIOGRAPHY;  Surgeon: Troy Sine, MD;  Location: [place] [facility];  Service: Cardiovascular;  Laterality: N/A;  . Right knee arthroscopy  1993  . Right thyroidectomy  2007  . TOTAL ABDOMINAL HYSTERECTOMY W/ BILATERAL SALPINGOOPHORECTOMY  1993  . UPPER GASTROINTESTINAL ENDOSCOPY       Current Outpatient Medications  Medication Sig Dispense Refill  . amLODipine (NORVASC) 5 MG tablet Take 1 tablet (5 mg total) by mouth daily. (Patient taking differently: Take 5 mg by mouth daily. Take a half tablet daily.) 90 tablet 3  . aspirin EC 81 MG tablet Take 1 tablet (81 mg total) by mouth daily. 30 tablet 11  . carvedilol (COREG) 12.5 MG tablet Take 1 tablet (12.5 mg total) by mouth 2 (two) times daily with a meal. 180 tablet 3  . Coenzyme Q10 (CO Q 10) 100 MG CAPS Take 100 mg by mouth daily.     . cyanocobalamin (,VITAMIN B-12,) 1000 MCG/ML injection Inject 1,000 mcg into the muscle every 30 (thirty) days.    . famotidine (PEPCID) 20 MG tablet Take 20 mg by mouth 2 (two) times daily.    Marland Kitchen glipiZIDE (GLUCOTROL) 10 MG tablet Take 10 mg by mouth 2 (two) times daily.    . isosorbide mononitrate (IMDUR) 30 MG 24 hr tablet Take 0.5 tablets (15 mg total) by mouth daily. 90 tablet 3  . levothyroxine (SYNTHROID) 75 MCG tablet Take 75 mcg by mouth daily.    Marland Kitchen LORazepam (ATIVAN) 0.5 MG tablet Take 0.5 mg by mouth See admin instructions. Take one tablet (0.5 mg) by mouth daily at bedtime, may also take one tablet (0.5 mg) during the day as needed for anxiety.    . magnesium oxide (MAG-OX) 400 MG tablet Take 400 mg by mouth daily.    . nitroGLYCERIN (NITROSTAT) 0.4 MG  SL tablet Place 1 tablet (0.4 mg total) under the tongue every 5 (five) minutes x 3 doses as needed for chest pain. 25 tablet 12  . pantoprazole (PROTONIX) 40 MG tablet Take 1 tablet (40 mg total) by mouth daily. 90 tablet 3  . rosuvastatin (CRESTOR) 40 MG tablet Take 1 tablet (40 mg total) by mouth at bedtime. 30 tablet 6  . sertraline (ZOLOFT) 50 MG tablet Take 50 mg by mouth daily at 6 PM.     . ticagrelor (BRILINTA) 90 MG TABS tablet Take 1 tablet (90 mg total) by mouth 2 (two) times daily. 60 tablet 11  No current facility-administered medications for this visit.    Allergies:   Codeine, Erythromycin, and Levaquin [levofloxacin]    Social History:  The patient  reports that he has never smoked. He has never used smokeless tobacco. He reports that he does not drink alcohol and does not use drugs.   Family History:  The patient's family history includes COPD in his father; CVA in his mother; Coronary artery disease in his father; Diabetes Mellitus II in his maternal grandmother and mother; Heart disease in his mother; Hyperlipidemia in his sister; Hypertension in his sister and sister; Uterine cancer in his sister.    ROS: All other systems are reviewed and negative. Unless otherwise mentioned in H&P    PHYSICAL EXAM: VS:  BP (!) 142/54   [name] 79   Ht 5\' 1"  (1.549 m)   Wt 130 lb (59 kg)   SpO2 97%   BMI 24.56 kg/m  , BMI Body mass index is 24.56 kg/m. GEN: Well nourished, well developed, in no acute distress HEENT: normal Neck: no JVD, carotid bruits, or masses Cardiac: RRR; no murmurs, rubs, or gallops,no edema  Respiratory:  Clear to auscultation bilaterally, normal work of breathing GI: soft, nontender, nondistended, + BS MS: no deformity or atrophy Skin: warm and dry, no rash Neuro:  Strength and sensation are intact Psych: euthymic mood, full affect   EKG: Not completed this office visit  Recent Labs: 12/30/2019: ALT 51; TSH 0.203 01/14/2020: BUN 23; Creatinine,  Ser 2.07; Hemoglobin 10.6; Platelets 318; Potassium 4.2; Sodium 135    Lipid Panel    Component Value Date/Time   CHOL 116 12/31/2019 0325   TRIG 216 (H) 12/31/2019 0325   HDL 26 (L) 12/31/2019 0325   CHOLHDL 4.5 12/31/2019 0325   VLDL 43 (H) 12/31/2019 0325   LDLCALC 47 12/31/2019 0325      Wt Readings from Last 3 Encounters:  02/06/20 130 lb (59 kg)  01/14/20 125 lb 4.3 oz (56.8 kg)  01/09/20 128 lb (58.1 kg)      Other studies Reviewed: [facility] 12/30/2019  Prox LAD lesion is 30% stenosed.  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  Post intervention, there is a 5% residual stenosis.   Mild nonobstructive stenosis of 30% in the proximal LAD in the region of the first diagonal and septal perforating artery.  Normal ramus intermediate and left circumflex coronary arteries.  Dominant RCA with total mid distal occlusion of the PDA vessel  DAPT initially but since no stenting was performed duration may be less than 1 year.  Will DC bivalirudin several hours post procedure.  Hydrate.  Aggressive lipid-lowering therapy with target LDL less than 70 and optimal blood pressure control.  Echocardiogram 12/31/2019 1. Left ventricular ejection fraction, by estimation, is 50 to 55%. The  left ventricle has low normal function. The left ventricle demonstrates  regional wall motion abnormalities (see scoring diagram/findings for  description). There is mild left  ventricular hypertrophy. Left ventricular diastolic parameters are  consistent with Grade I diastolic dysfunction (impaired relaxation). There  is moderate hypokinesis of the left ventricular, entire inferior wall,  apical segment and inferoseptal wall.  2. Right ventricular systolic function is normal. The right ventricular  size is normal.  3. The mitral valve is grossly normal. No evidence of mitral valve  regurgitation.  4. The aortic valve is tricuspid. Aortic valve regurgitation is not  visualized.  5. The inferior vena cava  is normal in size with greater than 50%  respiratory variability, suggesting  right atrial pressure of 3 mmHg.   ASSESSMENT AND PLAN:  1.  Coronary artery disease: History of STEMI in June 2021, this required to RPDA balloon angioplasty.  She was noted to have mild nonobstructive stenosis of 30% in the proximal LAD reason of the first diagonal and septal perforating artery.  She did return to the hospital with recurrent discomfort repeat cardiac catheterization was not completed, but antianginal medications were titrated.  She is not had any recurrent discomfort since being discharged on 01/14/2020.  She will need to have close follow-up in 3 months unless she is again symptomatic.  Continue isosorbide mononitrate 15 mg daily.  She will continue dual antiplatelet therapy with aspirin and ticagrelor as directed.  2.  Hypotension: Patient called our office with weakness dizziness and hypotension on 01/26/2020.  Amlodipine was decreased to 2.5 mg and carvedilol was decreased to 6.25 mg.  Blood pressure is better today.  142/54.  She states that it is actually in the 092Z systolically at home.  She is to continue take her blood pressure and report any blood pressures less than 110/50 to our office.  No further changes in regimen.  3.  Hyperlipidemia: Continue rosuvastatin 40 mg at at bedtime.  Goal of LDL less than 70.  She will need fasting lipids and LFTs prior to next office visit with Dr. Claiborne Billings in 3 months.   Current medicines are reviewed at length with the patient today.  I have spent 25 minutes  dedicated to the care of this patient on the date of this encounter to include pre-visit review of records, assessment, management and diagnostic testing,with shared decision making.  Labs/ tests ordered today include: fasting lipids and LFTs in 3 months,  Phill Myron. West Pugh, ANP, AACC   02/06/2020 2:51 PM    [facility] [place] [address] Office [phone] Fax [phone]  Notice: This dictation was prepared with Dragon dictation along with smaller phrase technology. Any transcriptional errors that result from this process are unintentional and may not be corrected upon review.

## 2020-02-06 ENCOUNTER — Ambulatory Visit: Payer: Medicare HMO | Admitting: Adult Health

## 2020-02-06 ENCOUNTER — Other Ambulatory Visit: Payer: Self-pay

## 2020-02-06 ENCOUNTER — Encounter: Payer: Self-pay | Admitting: Adult Health

## 2020-02-06 ENCOUNTER — Telehealth: Payer: Self-pay | Admitting: Cardiovascular Disease

## 2020-02-06 VITALS — BP 142/54 | HR 79 | Ht 61.0 in | Wt 130.0 lb

## 2020-02-06 DIAGNOSIS — I10 Essential (primary) hypertension: Secondary | ICD-10-CM

## 2020-02-06 DIAGNOSIS — E785 Hyperlipidemia, unspecified: Secondary | ICD-10-CM | POA: Diagnosis not present

## 2020-02-06 DIAGNOSIS — I2510 Atherosclerotic heart disease of native coronary artery without angina pectoris: Secondary | ICD-10-CM

## 2020-02-06 MED ORDER — AMLODIPINE BESYLATE 5 MG PO TABS: 2.5000 mg | ORAL_TABLET | Freq: Every day | ORAL | 3 refills | Status: DC

## 2020-02-06 MED ORDER — CARVEDILOL 12.5 MG PO TABS: 6.2500 mg | ORAL_TABLET | Freq: Two times a day (BID) | ORAL | 3 refills | Status: DC

## 2020-02-06 NOTE — Telephone Encounter (Signed)
Pt c/o medication issue:  1. Name of Medication: amLODipine (NORVASC) 5 MG tablet carvedilol (COREG) 12.5 MG tablet  2. How are you currently taking this medication (dosage and times per day)? N/A  3. Are you having a reaction (difficulty breathing--STAT)? N/A  4. What is your medication issue? Lars Mage with [facility] states these medications need to be called in the the correct pharmacy. Lars Mage states she is aware patient has 2 additional pharmacies listed, however she does not know which pharmacy medication needs to be called in to.

## 2020-02-06 NOTE — Patient Instructions (Signed)
Medication Instructions:  Continue current medications  *If you need a refill on your cardiac medications before your next appointment, please call your pharmacy*   Lab Work: None Ordered   Testing/Procedures: None Ordered   Follow-Up: At [facility], you and your health needs are our priority.  As part of our continuing mission to provide you with exceptional heart care, we have created designated Provider Care Teams.  These Care Teams include your primary Cardiologist (physician) and Advanced Practice Providers (APPs -  Physician Assistants and Nurse Practitioners) who all work together to provide you with the care you need, when you need it.  We recommend signing up for the patient portal called "MyChart".  Sign up information is provided on this After Visit Summary.  MyChart is used to connect with patients for Virtual Visits (Telemedicine).  Patients are able to view lab/test results, encounter notes, upcoming appointments, etc.  Non-urgent messages can be sent to your provider as well.   To learn more about what you can do with MyChart, go to [email or web address].    Your next appointment:   3 month(s)  The format for your next appointment:   In Person  Provider:   You may see Marguetta Windish Kelly, MD or one of the following Advanced Practice Providers on your designated Care Team:    Hao Meng, PA-C  Angela Duke, PA-C or   Krista Kroeger, PA-C     

## 2020-02-06 NOTE — Progress Notes (Signed)
Levada Dy from [facility] calling in to say that the meds ordered at patients OV were sent to them in error. Called the patient and confirmed that she would like meds sent to mail order [facility]. Reordered Rx for amlodipine and carvedilol which were originally sent to [facility] after today's office visit. Sent these new prescriptions to [facility].

## 2020-02-07 ENCOUNTER — Telehealth: Payer: Self-pay | Admitting: Cardiovascular Disease

## 2020-02-07 NOTE — Telephone Encounter (Signed)
She was uncertain that her BP monitor was working correctly. She will need to have this correlated with another BP monitor. She can go by a Psychologist, occupational and bring her BP monitor with her to compare. If BP is still low, she is to stop isosorbide. In the office yesterday it was normal.    Thanks  KL

## 2020-02-07 NOTE — Telephone Encounter (Signed)
Pt c/o BP issue: STAT if pt c/o blurred vision, one-sided weakness or slurred speech  1. What are your last 5 BP readings? 93/58, 113/70  2. Are you having any other symptoms (ex. Dizziness, headache, blurred vision, passed out)? Tiredness, real weak  3. What is your BP issue? Experiencing low blood pressure

## 2020-02-07 NOTE — Telephone Encounter (Signed)
Pt aware of recommendations and agrees with plan ./cy 

## 2020-02-07 NOTE — Telephone Encounter (Signed)
Per pt this am B/P was 93/58 HR ? and after drinking water B/P was 113/70 and HR 74 Pt was told by Curt Bears to call if B/P was under 100 Pt complains of being tired only . Will forward to Jory Sims NP for review .Adonis Housekeeper

## 2020-02-18 ENCOUNTER — Other Ambulatory Visit: Payer: Self-pay

## 2020-02-18 ENCOUNTER — Telehealth: Payer: Self-pay | Admitting: Licensed Clinical Social Worker

## 2020-02-18 NOTE — Patient Outreach (Signed)
[facility]) Care Management  02/18/2020  Jessica [name] March 14, 1951 [id number]   Telephone Screen  Referral Date: 02/18/20 Referral Source: MD Office([facility]) Referral Reason: "patient needs transportation to cardiac rehab-patient lives in [place] and the cardiac rehab is in [place] and becomes a challenge to cross county line with [place]: [employer]   Outreach attempt #1 to patient. Spoke with patient who reports she lives in the home along with her spouse. 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 Patient reports cardiologist wants her to start cardiac rehab. However, facility is not in the county in which she [place]. Patient reports she thinks there is a cardiac rehab in the county that she lives in and is nto opposed to going there if that will make transportation more accessible for her to get. She states SW at MD office has tried to get her transportation arranged without success. She voices she never heard back from [facility] and was told she did not qualify for Medicaid or their transportation. Patient unsure if she has transportation benefit with [facility]. RN CM provided patient/spouse with contact info to call to inquire and discussed how their transportation sytem works. RN CM also discussed [facility] SW referral for possible  transportation resources/assistance. Patient gave verbal consent. She denies any other RN CM needs or concerns at this time.      Plan: RN CM sent  [facility] SW referral for transportation assistance.    Jessica Montgomery, RN,BSN,CCM [place] Management Telephonic Care Management Coordinator Direct Phone: [phone] Toll Free: [phone] Fax: [phone]

## 2020-02-18 NOTE — Telephone Encounter (Signed)
CSW contacted patient to follow up to determine if she has heard back from [facility] regarding transport needs to cardiac rehab. Patient states she has not heard back and is still unable to locate any transport options. CSW left another message for [facility] to inquire and forwarded an request to [facility] to see if there are any other options. CSW available as needed. Raquel Sarna, [place], [place]

## 2020-02-22 ENCOUNTER — Other Ambulatory Visit: Payer: Self-pay | Admitting: *Deleted

## 2020-02-22 NOTE — Patient Outreach (Signed)
[facility]) Care Management  02/22/2020  Jessica [name] 1951-07-05 [id number]  CSW received referral to assist pt with transportation needs.  CSW made contact with pt and confirmed pt identity.  CSW introduced self, role and reason for call.  Per pt, she has been able to arrange for some rides through [facility] benefits.  She is limited to a certain # per yer and thus could need alternative options thereafter.  CSW will seek options for her and mail resource material to pt as well as plan a follow up call.  CSW provided pt with direct # to call to reach if urgent transportation needs arise.    Eduard Clos, MSW, [place] Worker  [place] [phone]

## 2020-02-28 DIAGNOSIS — Z20822 Contact with and (suspected) exposure to covid-19: Secondary | ICD-10-CM | POA: Diagnosis not present

## 2020-02-28 DIAGNOSIS — R05 Cough: Secondary | ICD-10-CM | POA: Diagnosis not present

## 2020-02-29 ENCOUNTER — Other Ambulatory Visit: Payer: Self-pay | Admitting: *Deleted

## 2020-02-29 MED ORDER — AMLODIPINE BESYLATE 5 MG PO TABS: 2.5000 mg | ORAL_TABLET | Freq: Every day | ORAL | 3 refills | Status: DC

## 2020-02-29 NOTE — Patient Outreach (Signed)
[facility]) Care Management  02/29/2020  Jessica [name] 07/16/1951 [id number]   CSW made contact with pt who reports no current transportation concerns.  "Human rides will start next week".  Per pt, [employer] will cover 12 trips to and from the outpatient cardiac rehab.  "that gets me 4 weeks of rides to the rehab and they think that will be enough".  Pt reports she did have to cancel an appointment on yesterday because she did not have a ride.  CSW reminded pt to call us if there is a need such as yesterday so we can assist with arrangements.   CSW confirmed pt has contact # to call.  CSW will update [facility] team, PCP and Tressia Danas, Outpatient clinic, of above and will plan to sign off.   Eduard Clos, MSW, [place] Worker  [place] [phone]

## 2020-02-29 NOTE — Telephone Encounter (Signed)
Rx has been sent to the pharmacy electronically. ° °

## 2020-03-06 DIAGNOSIS — R6889 Other general symptoms and signs: Secondary | ICD-10-CM | POA: Diagnosis not present

## 2020-03-06 DIAGNOSIS — Z9861 Coronary angioplasty status: Secondary | ICD-10-CM | POA: Diagnosis not present

## 2020-03-06 DIAGNOSIS — Z48812 Encounter for surgical aftercare following surgery on the circulatory system: Secondary | ICD-10-CM | POA: Diagnosis not present

## 2020-03-06 DIAGNOSIS — I252 Old myocardial infarction: Secondary | ICD-10-CM | POA: Diagnosis not present

## 2020-03-10 DIAGNOSIS — Z48812 Encounter for surgical aftercare following surgery on the circulatory system: Secondary | ICD-10-CM | POA: Diagnosis not present

## 2020-03-10 DIAGNOSIS — I252 Old myocardial infarction: Secondary | ICD-10-CM | POA: Diagnosis not present

## 2020-03-10 DIAGNOSIS — Z9861 Coronary angioplasty status: Secondary | ICD-10-CM | POA: Diagnosis not present

## 2020-03-10 DIAGNOSIS — R6889 Other general symptoms and signs: Secondary | ICD-10-CM | POA: Diagnosis not present

## 2020-03-12 DIAGNOSIS — Z9861 Coronary angioplasty status: Secondary | ICD-10-CM | POA: Diagnosis not present

## 2020-03-12 DIAGNOSIS — R6889 Other general symptoms and signs: Secondary | ICD-10-CM | POA: Diagnosis not present

## 2020-03-12 DIAGNOSIS — Z48812 Encounter for surgical aftercare following surgery on the circulatory system: Secondary | ICD-10-CM | POA: Diagnosis not present

## 2020-03-12 DIAGNOSIS — I252 Old myocardial infarction: Secondary | ICD-10-CM | POA: Diagnosis not present

## 2020-03-13 DIAGNOSIS — Z9861 Coronary angioplasty status: Secondary | ICD-10-CM | POA: Diagnosis not present

## 2020-03-13 DIAGNOSIS — I252 Old myocardial infarction: Secondary | ICD-10-CM | POA: Diagnosis not present

## 2020-03-13 DIAGNOSIS — Z48812 Encounter for surgical aftercare following surgery on the circulatory system: Secondary | ICD-10-CM | POA: Diagnosis not present

## 2020-03-13 DIAGNOSIS — R6889 Other general symptoms and signs: Secondary | ICD-10-CM | POA: Diagnosis not present

## 2020-05-09 ENCOUNTER — Other Ambulatory Visit: Payer: Self-pay | Admitting: Internal Medicine

## 2020-05-09 DIAGNOSIS — E1129 Type 2 diabetes mellitus with other diabetic kidney complication: Secondary | ICD-10-CM | POA: Diagnosis not present

## 2020-05-09 DIAGNOSIS — I2510 Atherosclerotic heart disease of native coronary artery without angina pectoris: Secondary | ICD-10-CM | POA: Diagnosis not present

## 2020-05-09 DIAGNOSIS — E785 Hyperlipidemia, unspecified: Secondary | ICD-10-CM | POA: Diagnosis not present

## 2020-05-09 DIAGNOSIS — I129 Hypertensive chronic kidney disease with stage 1 through stage 4 chronic kidney disease, or unspecified chronic kidney disease: Secondary | ICD-10-CM | POA: Diagnosis not present

## 2020-05-09 DIAGNOSIS — N184 Chronic kidney disease, stage 4 (severe): Secondary | ICD-10-CM | POA: Diagnosis not present

## 2020-05-09 DIAGNOSIS — Z23 Encounter for immunization: Secondary | ICD-10-CM | POA: Diagnosis not present

## 2020-05-09 DIAGNOSIS — Z9861 Coronary angioplasty status: Secondary | ICD-10-CM | POA: Diagnosis not present

## 2020-05-09 DIAGNOSIS — I10 Essential (primary) hypertension: Secondary | ICD-10-CM | POA: Diagnosis not present

## 2020-05-09 DIAGNOSIS — R911 Solitary pulmonary nodule: Secondary | ICD-10-CM

## 2020-05-09 DIAGNOSIS — I700 Atherosclerosis of aorta: Secondary | ICD-10-CM | POA: Diagnosis not present

## 2020-05-13 ENCOUNTER — Other Ambulatory Visit: Payer: Self-pay

## 2020-05-13 ENCOUNTER — Encounter (HOSPITAL_COMMUNITY): Payer: Self-pay | Admitting: Emergency Medicine

## 2020-05-13 ENCOUNTER — Encounter: Payer: Self-pay | Admitting: Cardiovascular Disease

## 2020-05-13 ENCOUNTER — Inpatient Hospital Stay (HOSPITAL_COMMUNITY)
Admission: EM | Admit: 2020-05-13 | Discharge: 2020-05-16 | DRG: 605 | Disposition: A | Discharge status: Home Health | Payer: Medicare HMO | Attending: General Surgery | Admitting: General Surgery

## 2020-05-13 ENCOUNTER — Emergency Department (HOSPITAL_COMMUNITY): Payer: Medicare HMO

## 2020-05-13 ENCOUNTER — Ambulatory Visit: Payer: Medicare HMO | Admitting: Cardiovascular Disease

## 2020-05-13 DIAGNOSIS — Z79890 Hormone replacement therapy (postmenopausal): Secondary | ICD-10-CM

## 2020-05-13 DIAGNOSIS — E1122 Type 2 diabetes mellitus with diabetic chronic kidney disease: Secondary | ICD-10-CM | POA: Diagnosis not present

## 2020-05-13 DIAGNOSIS — Z7984 Long term (current) use of oral hypoglycemic drugs: Secondary | ICD-10-CM | POA: Diagnosis not present

## 2020-05-13 DIAGNOSIS — E1165 Type 2 diabetes mellitus with hyperglycemia: Secondary | ICD-10-CM | POA: Diagnosis not present

## 2020-05-13 DIAGNOSIS — E118 Type 2 diabetes mellitus with unspecified complications: Secondary | ICD-10-CM | POA: Diagnosis not present

## 2020-05-13 DIAGNOSIS — S301XXA Contusion of abdominal wall, initial encounter: Principal | ICD-10-CM | POA: Diagnosis present

## 2020-05-13 DIAGNOSIS — Z79899 Other long term (current) drug therapy: Secondary | ICD-10-CM

## 2020-05-13 DIAGNOSIS — I2510 Atherosclerotic heart disease of native coronary artery without angina pectoris: Secondary | ICD-10-CM | POA: Diagnosis not present

## 2020-05-13 DIAGNOSIS — Z7901 Long term (current) use of anticoagulants: Secondary | ICD-10-CM | POA: Diagnosis not present

## 2020-05-13 DIAGNOSIS — I129 Hypertensive chronic kidney disease with stage 1 through stage 4 chronic kidney disease, or unspecified chronic kidney disease: Secondary | ICD-10-CM | POA: Diagnosis present

## 2020-05-13 DIAGNOSIS — R1084 Generalized abdominal pain: Secondary | ICD-10-CM | POA: Diagnosis not present

## 2020-05-13 DIAGNOSIS — E039 Hypothyroidism, unspecified: Secondary | ICD-10-CM

## 2020-05-13 DIAGNOSIS — Z7982 Long term (current) use of aspirin: Secondary | ICD-10-CM

## 2020-05-13 DIAGNOSIS — W19XXXA Unspecified fall, initial encounter: Secondary | ICD-10-CM | POA: Diagnosis present

## 2020-05-13 DIAGNOSIS — T457X5A Adverse effect of anticoagulant antagonists, vitamin K and other coagulants, initial encounter: Secondary | ICD-10-CM | POA: Diagnosis not present

## 2020-05-13 DIAGNOSIS — N183 Chronic kidney disease, stage 3 unspecified: Secondary | ICD-10-CM | POA: Diagnosis not present

## 2020-05-13 DIAGNOSIS — Y92000 Kitchen of unspecified non-institutional (private) residence as  the place of occurrence of the external cause: Secondary | ICD-10-CM

## 2020-05-13 DIAGNOSIS — R109 Unspecified abdominal pain: Secondary | ICD-10-CM | POA: Diagnosis not present

## 2020-05-13 DIAGNOSIS — E785 Hyperlipidemia, unspecified: Secondary | ICD-10-CM | POA: Diagnosis present

## 2020-05-13 DIAGNOSIS — D62 Acute posthemorrhagic anemia: Secondary | ICD-10-CM | POA: Diagnosis present

## 2020-05-13 DIAGNOSIS — I959 Hypotension, unspecified: Secondary | ICD-10-CM | POA: Diagnosis present

## 2020-05-13 DIAGNOSIS — Z7902 Long term (current) use of antithrombotics/antiplatelets: Secondary | ICD-10-CM | POA: Diagnosis not present

## 2020-05-13 DIAGNOSIS — I10 Essential (primary) hypertension: Secondary | ICD-10-CM | POA: Diagnosis not present

## 2020-05-13 DIAGNOSIS — I2111 ST elevation (STEMI) myocardial infarction involving right coronary artery: Secondary | ICD-10-CM | POA: Diagnosis not present

## 2020-05-13 DIAGNOSIS — I252 Old myocardial infarction: Secondary | ICD-10-CM | POA: Diagnosis not present

## 2020-05-13 DIAGNOSIS — S0083XA Contusion of other part of head, initial encounter: Secondary | ICD-10-CM | POA: Diagnosis not present

## 2020-05-13 DIAGNOSIS — R9431 Abnormal electrocardiogram [ECG] [EKG]: Secondary | ICD-10-CM | POA: Diagnosis not present

## 2020-05-13 DIAGNOSIS — R52 Pain, unspecified: Secondary | ICD-10-CM | POA: Diagnosis not present

## 2020-05-13 DIAGNOSIS — R58 Hemorrhage, not elsewhere classified: Secondary | ICD-10-CM | POA: Diagnosis not present

## 2020-05-13 DIAGNOSIS — Z043 Encounter for examination and observation following other accident: Secondary | ICD-10-CM | POA: Diagnosis not present

## 2020-05-13 DIAGNOSIS — R55 Syncope and collapse: Secondary | ICD-10-CM | POA: Diagnosis not present

## 2020-05-13 DIAGNOSIS — R21 Rash and other nonspecific skin eruption: Secondary | ICD-10-CM | POA: Diagnosis not present

## 2020-05-13 DIAGNOSIS — H547 Unspecified visual loss: Secondary | ICD-10-CM | POA: Diagnosis not present

## 2020-05-13 DIAGNOSIS — S3993XA Unspecified injury of pelvis, initial encounter: Secondary | ICD-10-CM | POA: Diagnosis not present

## 2020-05-13 HISTORY — DX: Acute myocardial infarction, unspecified: I21.9

## 2020-05-13 LAB — CBC
HCT: 25.7 % — ABNORMAL LOW (ref 36.0–46.0)
Hemoglobin: 7.9 g/dL — ABNORMAL LOW (ref 12.0–15.0)
MCH: 30.2 pg (ref 26.0–34.0)
MCHC: 30.7 g/dL (ref 30.0–36.0)
MCV: 98.1 fL (ref 80.0–100.0)
Platelets: 306 10*3/uL (ref 150–400)
RBC: 2.62 MIL/uL — ABNORMAL LOW (ref 3.87–5.11)
RDW: 13 % (ref 11.5–15.5)
WBC: 10.6 10*3/uL — ABNORMAL HIGH (ref 4.0–10.5)
nRBC: 0 % (ref 0.0–0.2)

## 2020-05-13 LAB — URINALYSIS, ROUTINE W REFLEX MICROSCOPIC
Bilirubin Urine: NEGATIVE
Glucose, UA: 50 mg/dL — AB
Hgb urine dipstick: NEGATIVE
Ketones, ur: NEGATIVE mg/dL
Leukocytes,Ua: NEGATIVE
Nitrite: NEGATIVE
Protein, ur: NEGATIVE mg/dL
Specific Gravity, Urine: 1.026 (ref 1.005–1.030)
pH: 7 (ref 5.0–8.0)

## 2020-05-13 LAB — I-STAT CHEM 8, ED
BUN: 31 mg/dL — ABNORMAL HIGH (ref 8–23)
Calcium, Ion: 1.15 mmol/L (ref 1.15–1.40)
Chloride: 99 mmol/L (ref 98–111)
Creatinine, Ser: 1.9 mg/dL — ABNORMAL HIGH (ref 0.44–1.00)
Glucose, Bld: 267 mg/dL — ABNORMAL HIGH (ref 70–99)
HCT: 24 % — ABNORMAL LOW (ref 36.0–46.0)
Hemoglobin: 8.2 g/dL — ABNORMAL LOW (ref 12.0–15.0)
Potassium: 5.4 mmol/L — ABNORMAL HIGH (ref 3.5–5.1)
Sodium: 133 mmol/L — ABNORMAL LOW (ref 135–145)
TCO2: 23 mmol/L (ref 22–32)

## 2020-05-13 LAB — COMPREHENSIVE METABOLIC PANEL
ALT: 54 U/L — ABNORMAL HIGH (ref 0–44)
AST: 43 U/L — ABNORMAL HIGH (ref 15–41)
Albumin: 3.4 g/dL — ABNORMAL LOW (ref 3.5–5.0)
Alkaline Phosphatase: 48 U/L (ref 38–126)
Anion gap: 7 (ref 5–15)
BUN: 30 mg/dL — ABNORMAL HIGH (ref 8–23)
CO2: 24 mmol/L (ref 22–32)
Calcium: 8.5 mg/dL — ABNORMAL LOW (ref 8.9–10.3)
Chloride: 99 mmol/L (ref 98–111)
Creatinine, Ser: 1.97 mg/dL — ABNORMAL HIGH (ref 0.44–1.00)
GFR, Estimated: 25 mL/min — ABNORMAL LOW (ref >60)
Glucose, Bld: 289 mg/dL — ABNORMAL HIGH (ref 70–99)
Potassium: 5.3 mmol/L — ABNORMAL HIGH (ref 3.5–5.1)
Sodium: 130 mmol/L — ABNORMAL LOW (ref 135–145)
Total Bilirubin: 0.6 mg/dL (ref 0.3–1.2)
Total Protein: 5.9 g/dL — ABNORMAL LOW (ref 6.5–8.1)

## 2020-05-13 LAB — ETHANOL: Alcohol, Ethyl (B): <10 mg/dL (ref <10)

## 2020-05-13 LAB — PREPARE RBC (CROSSMATCH)

## 2020-05-13 LAB — LACTIC ACID, PLASMA: Lactic Acid, Venous: 1.4 mmol/L (ref 0.5–1.9)

## 2020-05-13 LAB — PROTIME-INR
INR: 1 (ref 0.8–1.2)
Prothrombin Time: 13.2 seconds (ref 11.4–15.2)

## 2020-05-13 LAB — SAMPLE TO BLOOD BANK

## 2020-05-13 MED ORDER — ONDANSETRON HCL 4 MG/2ML IJ SOLN
4.0000 mg | Freq: Once | INTRAMUSCULAR | Status: AC
  Administered 2020-05-13: 4 mg via INTRAVENOUS
  Filled 2020-05-13: qty 2

## 2020-05-13 MED ORDER — METOPROLOL SUCCINATE ER 25 MG PO TB24
12.5000 mg | ORAL_TABLET | Freq: Every day | ORAL | 3 refills | Status: DC
Start: 2020-05-13 — End: 2020-06-11

## 2020-05-13 MED ORDER — SODIUM CHLORIDE 0.9 % IV BOLUS
1000.0000 mL | Freq: Once | INTRAVENOUS | Status: AC
  Administered 2020-05-13: 1000 mL via INTRAVENOUS

## 2020-05-13 MED ORDER — SODIUM CHLORIDE 0.9% IV SOLUTION: Freq: Once | INTRAVENOUS | Status: AC

## 2020-05-13 MED ORDER — IOHEXOL 300 MG/ML  SOLN
75.0000 mL | Freq: Once | INTRAMUSCULAR | Status: AC | PRN
  Administered 2020-05-13: 75 mL via INTRAVENOUS

## 2020-05-13 MED ORDER — FENTANYL CITRATE (PF) 100 MCG/2ML IJ SOLN
25.0000 ug | Freq: Once | INTRAMUSCULAR | Status: AC
  Administered 2020-05-13: 25 ug via INTRAVENOUS
  Filled 2020-05-13: qty 2

## 2020-05-13 MED ORDER — ONDANSETRON HCL 4 MG/2ML IJ SOLN
4.0000 mg | Freq: Once | INTRAMUSCULAR | Status: AC
  Administered 2020-05-13: 4 mg via INTRAVENOUS

## 2020-05-13 NOTE — ED Provider Notes (Signed)
[facility] Emergency Department Provider Note MRN:  [id number]  Arrival date & time: 05/13/20     Chief Complaint   Fall   History of Present Illness   Jessica [name] is a 69 y.o. year-old female with a history of CAD presenting to the ED with chief complaint of fall.  Per report patient was walking fast in the kitchen and accidentally struck her abdomen against the corner of the countertop.  This area of her abdomen began to swell, she began to feel generally unwell, malaise, fatigue, lightheadedness, had 2 or 3 near syncopal episodes forcing her to lower herself or fall to the ground.  She is endorsing some neck pain due to the fall.  Denies significant head trauma.  She is having persistent nausea vomiting since this event.  Denies chest pain or shortness of breath.  Pain and swelling located on the left lower quadrant of the abdomen.  Symptoms constant, moderate to severe.  Review of Systems  A complete 10 system review of systems was obtained and all systems are negative except as noted in the HPI and PMH.   Patient's Health History    Past Medical History:  Diagnosis Date  . Myocardial infarct [facility])     History reviewed. No pertinent surgical history.  No family history on file.  Social History   Socioeconomic History  . Marital status: Married    Spouse name: Not on file  . Number of children: Not on file  . Years of education: Not on file  . Highest education level: Not on file  Occupational History  . Not on file  Tobacco Use  . Smoking status: Not on file  Substance and Sexual Activity  . Alcohol use: Not on file  . Drug use: Not on file  . Sexual activity: Not on file  Other Topics Concern  . Not on file  Social History Narrative  . Not on file   Social Determinants of Health   Financial Resource Strain:   . Difficulty of Paying Living Expenses: Not on file  Food Insecurity:   . Worried About Charity fundraiser in the Last Year:  Not on file  . Ran Out of Food in the Last Year: Not on file  Transportation Needs:   . Lack of Transportation (Medical): Not on file  . Lack of Transportation (Non-Medical): Not on file  Physical Activity:   . Days of Exercise per Week: Not on file  . Minutes of Exercise per Session: Not on file  Stress:   . Feeling of Stress : Not on file  Social Connections:   . Frequency of Communication with Friends and Family: Not on file  . Frequency of Social Gatherings with Friends and Family: Not on file  . Attends Religious Services: Not on file  . Active Member of Clubs or Organizations: Not on file  . Attends Archivist Meetings: Not on file  . Marital Status: Not on file  Intimate Partner Violence:   . Fear of Current or Ex-Partner: Not on file  . Emotionally Abused: Not on file  . Physically Abused: Not on file  . Sexually Abused: Not on file     Physical Exam   Vitals:   05/13/20 2230 05/13/20 2300  BP: 94/61 (!) 89/67  Pulse: 91 85  Resp: 17 17  Temp:    SpO2: 100% 98%    CONSTITUTIONAL: Well-appearing, NAD NEURO:  Alert and oriented x 3, no focal deficits  EYES:  eyes equal and reactive ENT/NECK:  no LAD, no JVD CARDIO: Regular rate, well-perfused, normal S1 and S2 PULM:  CTAB no wheezing or rhonchi GI/GU:  normal bowel sounds, bruising to the left lower quadrant with underlying dense hematoma MSK/SPINE:  No gross deformities, no edema SKIN:  no rash, atraumatic PSYCH:  Appropriate speech and behavior  *Additional and/or pertinent findings included in MDM below  Diagnostic and Interventional Summary    EKG Interpretation  Date/Time:    Ventricular Rate:    PR Interval:    QRS Duration:   QT Interval:    QTC Calculation:   R Axis:     Text Interpretation:        Labs Reviewed  COMPREHENSIVE METABOLIC PANEL - Abnormal; Notable for the following components:      Result Value   Sodium 130 (*)    Potassium 5.3 (*)    Glucose, Bld 289 (*)    BUN  30 (*)    Creatinine, Ser 1.97 (*)    Calcium 8.5 (*)    Total Protein 5.9 (*)    Albumin 3.4 (*)    AST 43 (*)    ALT 54 (*)    GFR, Estimated 25 (*)    All other components within normal limits  CBC - Abnormal; Notable for the following components:   WBC 10.6 (*)    RBC 2.62 (*)    Hemoglobin 7.9 (*)    HCT 25.7 (*)    All other components within normal limits  URINALYSIS, ROUTINE W REFLEX MICROSCOPIC - Abnormal; Notable for the following components:   Glucose, UA 50 (*)    All other components within normal limits  I-STAT CHEM 8, ED - Abnormal; Notable for the following components:   Sodium 133 (*)    Potassium 5.4 (*)    BUN 31 (*)    Creatinine, Ser 1.90 (*)    Glucose, Bld 267 (*)    Hemoglobin 8.2 (*)    HCT 24.0 (*)    All other components within normal limits  ETHANOL  LACTIC ACID, PLASMA  PROTIME-INR  SAMPLE TO BLOOD BANK    CT HEAD WO CONTRAST  Final Result    CT CERVICAL SPINE WO CONTRAST  Final Result    CT CHEST W CONTRAST  Final Result    CT ABDOMEN PELVIS W CONTRAST  Final Result    DG Chest Port 1 View  Final Result    DG Pelvis Portable  Final Result      Medications  ondansetron (ZOFRAN) injection 4 mg (has no administration in time range)  sodium chloride 0.9 % bolus 1,000 mL (has no administration in time range)  ondansetron (ZOFRAN) injection 4 mg (4 mg Intravenous Given 05/13/20 2111)  iohexol (OMNIPAQUE) 300 MG/ML solution 75 mL (75 mLs Intravenous Contrast Given 05/13/20 2136)  fentaNYL (SUBLIMAZE) injection 25 mcg (25 mcg Intravenous Given 05/13/20 2230)     Procedures  /  Critical Care .Critical Care Performed by: Maudie Flakes, MD Authorized by: Maudie Flakes, MD   Critical care provider statement:    Critical care time (minutes):  32   Critical care was necessary to treat or prevent imminent or life-threatening deterioration of the following conditions:  Trauma (Abdominal wall hematoma with concern for active  extravasation, hypotension.)   Critical care was time spent personally by me on the following activities:  Discussions with consultants, evaluation of patient's response to treatment, examination of patient, ordering and performing treatments  and interventions, ordering and review of laboratory studies, ordering and review of radiographic studies, pulse oximetry, re-evaluation of patient's condition, obtaining history from patient or surrogate and review of old charts Ultrasound ED FAST  Date/Time: 05/13/2020 11:20 PM Performed by: Maudie Flakes, MD Authorized by: Maudie Flakes, MD  Procedure details:    Indications: blunt abdominal trauma      Assess for:  Intra-abdominal fluid and pericardial effusion    Technique:  Abdominal and cardiac    Images: not archived      Abdominal findings:    L kidney:  Visualized   R kidney:  Visualized   Liver:  Visualized   Bladder:  Visualized,    Hepatorenal space visualized: identified     Splenorenal space: identified     Rectovesical free fluid: not identified     Splenorenal free fluid: not identified     Hepatorenal space free fluid: not identified   Cardiac findings:    Heart:  Visualized   Wall motion: identified     Pericardial effusion: not identified      ED Course and Medical Decision Making  I have reviewed the triage vital signs, the nursing notes, and pertinent available records from the EMR.  Listed above are laboratory and imaging tests that I personally ordered, reviewed, and interpreted and then considered in my medical decision making (see below for details).  Recent MI on [place] presenting with blunt abdominal trauma, primary survey reassuring, obvious trauma is isolated to the abdomen with likely abdominal wall hematoma.  CT imaging is obtained confirming abdominal wall hematoma, no signs of blunt significant trauma to the abdominal organs, question of active extravasation in the abdominal wall.  Patient's blood  pressure continues to trend down, continues to have nausea vomiting.  Will provide fluids, Zofran, to be admitted to trauma service for further care.       Barth Kirks. [place]onia Small, [place] [email or web address]  Final Clinical Impressions(s) / ED Diagnoses     ICD-10-CM   1. Anticoagulated  Z79.01   2. Fall  W19.XXXA   3. Abdominal wall hematoma, initial encounter  S30.1XXA     ED Discharge Orders    None       Discharge Instructions Discussed with and Provided to Patient:   Discharge Instructions   None       Maudie Flakes, MD 05/13/20 2322

## 2020-05-13 NOTE — Patient Instructions (Signed)
Medication Instructions:  Stop taking your carvedilol START taking metoprolol succinate 12.5 mg (half a tablet) once a day. *If you need a refill on your cardiac medications before your next appointment, please call your pharmacy*   Lab Work: None ordered If you have labs (blood work) drawn today and your tests are completely normal, you will receive your results only by: Marland Kitchen MyChart Message (if you have MyChart) OR . A paper copy in the mail If you have any lab test that is abnormal or we need to change your treatment, we will call you to review the results.   Testing/Procedures: None ordered   Follow-Up: At [facility], you and your health needs are our priority.  As part of our continuing mission to provide you with exceptional heart care, we have created designated Provider Care Teams.  These Care Teams include your primary Cardiologist (physician) and Advanced Practice Providers (APPs -  Physician Assistants and Nurse Practitioners) who all work together to provide you with the care you need, when you need it.  We recommend signing up for the patient portal called "MyChart".  Sign up information is provided on this After Visit Summary.  MyChart is used to connect with patients for Virtual Visits (Telemedicine).  Patients are able to view lab/test results, encounter notes, upcoming appointments, etc.  Non-urgent messages can be sent to your provider as well.   To learn more about what you can do with MyChart, go to [email or web address].    Your next appointment:   6 month(s)  The format for your next appointment:   In Person  Provider:   Shelva Majestic, MD   Other Instructions None

## 2020-05-13 NOTE — Progress Notes (Signed)
Cardiology Office Note    Date:  05/15/2020   ID:  Jessica [name], Jessica [name] 1951/04/24, MRN [id number]  PCP:  Marton Redwood, MD  Cardiologist:  Shelva Majestic, MD   Initial post hospital office visit with me  History of Present Illness:  Jessica [name] is a 69 y.o. female who suffered an acute coronary syndrome on December 30, 2019.  She was seen by Bunnie Domino for initial post hospital evaluation presents to the office today with me for follow-up evaluation.  Jessica [name] is a 69 year old female who has a history of hypertension and hyperlipidemia.  In early June she began to experience episodic shortness of breath.  On the morning of December 30, 2019 she experienced significant substernal chest pressure and tightness with radiation to her back.  She presented to [facility] emergency room where her ECG initially showed early inferior ST elevation.  A code STEMI was activated.  She was taken emergently to the catheterization laboratory by me and was found to have mild nonobstructive stenosis of 30% in the proximal LAD in the region of the first diagonal septal perforating artery.  She had a normal ramus intermediate and left circumflex vessel.  She had a dominant RCA with total mid distal occlusion of the PDA vessel.  Her vessel caliber was small and she underwent successful balloon angioplasty with restoration of TIMI-3 flow.  When evaluated by Bunnie Domino in July 2021 she continued to be on antiplatelet therapy with aspirin and Brilinta.  An initial echo Doppler study had shown an EF of 55 to 60% with grade 1 diastolic dysfunction with wall motion abnormality involving the inferior wall apical segment inferoseptal segment.  She was readmitted to the hospital on January 13, 2020 with associated chest and epigastric pain with radiation to her upper back and shoulder blades.  Amlodipine and Imdur were added to her medical regimen.  She subsequently developed hypotension and amlodipine dose was decreased to 5 mg  and carvedilol to 6.25 mg twice a day.  Presently, she feels well.  At times she feels like she has to yawn to take a deep breath.  She denies any recurrent chest tightness.  She has continued to be on aspirin and ticagrelor for antiplatelet therapy.  She is on isosorbide 15 mg and carvedilol 6.25 mg twice a day; however she has only been taking carvedilol on a daily basis she is diabetic on Jardiance 10 mg and has hypothyroidism on levothyroxine 75 mcg.  She has been on rosuvastatin 20 mg.  LDL cholesterol was 47.  She presents for initial post hospital evaluation with me.  Past Medical History:  Diagnosis Date  . Anemia   . Anxiety   . CKD (chronic kidney disease)    CKD III per PCP notes-pt denies  . Congenital blindness   . Diabetes mellitus   . Diverticula of small intestine   . Esophageal stricture   . GERD (gastroesophageal reflux disease)   . H/O hiatal hernia   . Hyperlipidemia   . Hypertension   . Hypothyroidism   . Iron deficiency anemia   . Low magnesium level   . Low sodium levels   . Osteopenia   . Osteoporosis   . Postoperative nausea and vomiting   . Renal disease   . Retinitis   . STEMI (ST elevation myocardial infarction) ([place])   . SVT (supraventricular tachycardia) ([place])   . Vitamin B 12 deficiency     Past Surgical History:  Procedure Laterality Date  .  BACK SURGERY    . CARPAL TUNNEL RELEASE     Right   . CESAREAN SECTION    . CHOLECYSTECTOMY  1985  . COLONOSCOPY    . CORONARY BALLOON ANGIOPLASTY N/A 12/30/2019   Procedure: CORONARY BALLOON ANGIOPLASTY;  Surgeon: Troy Sine, MD;  Location: [place] [facility];  Service: Cardiovascular;  Laterality: N/A;  . CORONARY/GRAFT ACUTE MI REVASCULARIZATION N/A 12/30/2019   Procedure: Coronary/Graft Acute MI Revascularization;  Surgeon: Troy Sine, MD;  Location: [place] [facility];  Service: Cardiovascular;  Laterality: N/A;  . HERNIA REPAIR    . LEFT HEART CATH AND CORONARY ANGIOGRAPHY N/A 12/30/2019    Procedure: LEFT HEART CATH AND CORONARY ANGIOGRAPHY;  Surgeon: Troy Sine, MD;  Location: [place] [facility];  Service: Cardiovascular;  Laterality: N/A;  . Right knee arthroscopy  1993  . Right thyroidectomy  2007  . TOTAL ABDOMINAL HYSTERECTOMY W/ BILATERAL SALPINGOOPHORECTOMY  1993  . UPPER GASTROINTESTINAL ENDOSCOPY      Current Medications: Outpatient Medications Prior to Visit  Medication Sig Dispense Refill  . aspirin EC 81 MG tablet Take 1 tablet (81 mg total) by mouth daily. 30 tablet 11  . Coenzyme Q10 (CO Q 10) 100 MG CAPS Take 100 mg by mouth daily.     . cyanocobalamin (,VITAMIN B-12,) 1000 MCG/ML injection Inject 1,000 mcg into the muscle every 30 (thirty) days.    . empagliflozin (JARDIANCE) 10 MG TABS tablet Take 10 mg by mouth daily.    . famotidine (PEPCID) 20 MG tablet Take 20 mg by mouth 2 (two) times daily.    . isosorbide mononitrate (IMDUR) 30 MG 24 hr tablet Take 0.5 tablets (15 mg total) by mouth daily. 90 tablet 3  . levothyroxine (SYNTHROID) 75 MCG tablet Take 75 mcg by mouth daily.    Marland Kitchen LORazepam (ATIVAN) 0.5 MG tablet Take 0.5 mg by mouth See admin instructions. Take one tablet (0.5 mg) by mouth daily at bedtime, may also take one tablet (0.5 mg) during the day as needed for anxiety.    . magnesium oxide (MAG-OX) 400 MG tablet Take 400 mg by mouth daily.    . nitroGLYCERIN (NITROSTAT) 0.4 MG SL tablet Place 1 tablet (0.4 mg total) under the tongue every 5 (five) minutes x 3 doses as needed for chest pain. 25 tablet 12  . pantoprazole (PROTONIX) 40 MG tablet Take 1 tablet (40 mg total) by mouth daily. 90 tablet 3  . rosuvastatin (CRESTOR) 40 MG tablet Take 1 tablet (40 mg total) by mouth at bedtime. 30 tablet 6  . sertraline (ZOLOFT) 50 MG tablet Take 50 mg by mouth daily at 6 PM.     . ticagrelor (BRILINTA) 90 MG TABS tablet Take 1 tablet (90 mg total) by mouth 2 (two) times daily. 60 tablet 11  . amLODipine (NORVASC) 5 MG tablet Take 0.5 tablets (2.5 mg  total) by mouth daily. 90 tablet 3  . carvedilol (COREG) 12.5 MG tablet Take 0.5 tablets (6.25 mg total) by mouth 2 (two) times daily with a meal. 180 tablet 3  . glipiZIDE (GLUCOTROL) 10 MG tablet Take 10 mg by mouth 2 (two) times daily.     No facility-administered medications prior to visit.     Allergies:   Codeine, Erythromycin, and Levaquin [levofloxacin]   Social History   Socioeconomic History  . Marital status: Married    Spouse name: Not on file  . Number of children: 4  . Years of education: Not on  file  . Highest education level: Not on file  Occupational History  . Occupation: retired    Fish farm manager: [employer]  Tobacco Use  . Smoking status: Never Smoker  . Smokeless tobacco: Never Used  Vaping Use  . Vaping Use: Never used  Substance and Sexual Activity  . Alcohol use: No  . Drug use: No  . Sexual activity: Not Currently  Other Topics Concern  . Not on file  Social History Narrative  . Not on file   Social Determinants of Health   Financial Resource Strain:   . Difficulty of Paying Living Expenses: Not on file  Food Insecurity:   . Worried About Charity fundraiser in the Last Year: Not on file  . Ran Out of Food in the Last Year: Not on file  Transportation Needs:   . Lack of Transportation (Medical): Not on file  . Lack of Transportation (Non-Medical): Not on file  Physical Activity:   . Days of Exercise per Week: Not on file  . Minutes of Exercise per Session: Not on file  Stress:   . Feeling of Stress : Not on file  Social Connections:   . Frequency of Communication with Friends and Family: Not on file  . Frequency of Social Gatherings with Friends and Family: Not on file  . Attends Religious Services: Not on file  . Active Member of Clubs or Organizations: Not on file  . Attends Archivist Meetings: Not on file  . Marital Status: Not on file     Family History:  The patient's family history includes COPD in her father;  CVA in her mother; Coronary artery disease in her father; Diabetes Mellitus II in her maternal grandmother and mother; Heart disease in her mother; Hyperlipidemia in her sister; Hypertension in her sister and sister; Uterine cancer in her sister.   ROS General: Negative; No fevers, chills, or night sweats;  HEENT: Negative; No changes in vision or hearing, sinus congestion, difficulty swallowing Pulmonary: Negative; No cough, wheezing, shortness of breath, hemoptysis Cardiovascular: Negative; No chest pain, presyncope, syncope, palpitations GI: Negative; No nausea, vomiting, diarrhea, or abdominal pain GU: Negative; No dysuria, hematuria, or difficulty voiding Musculoskeletal: Negative; no myalgias, joint pain, or weakness Hematologic/Oncology: Negative; no easy bruising, bleeding Endocrine: Negative; no heat/cold intolerance; no diabetes Neuro: Negative; no changes in balance, headaches Skin: Negative; No rashes or skin lesions Psychiatric: Negative; No behavioral problems, depression Sleep: Negative; No snoring, daytime sleepiness, hypersomnolence, bruxism, restless legs, hypnogognic hallucinations, no cataplexy Other comprehensive 14 point system review is negative.   PHYSICAL EXAM:   VS:  BP 124/72   Pulse 64   Ht $R'5\' 1"'Lo$  (1.549 m)   Wt 125 lb (56.7 kg)   SpO2 98%   BMI 23.62 kg/m     Repeat blood pressure by me was 122/70  Wt Readings from Last 3 Encounters:  05/13/20 125 lb (56.7 kg)  02/06/20 130 lb (59 kg)  01/14/20 125 lb 4.3 oz (56.8 kg)    General: Alert, oriented, no distress.  Skin: normal turgor, no rashes, warm and dry HEENT: Normocephalic, atraumatic. Pupils equal round and reactive to light; sclera anicteric; extraocular muscles intact;  Nose without nasal septal hypertrophy Mouth/Parynx benign; Mallinpatti scale 3 Neck: No JVD, no carotid bruits; normal carotid upstroke Lungs: clear to ausculatation and percussion; no wheezing or rales Chest wall: without  tenderness to palpitation Heart: PMI not displaced, RRR, s1 s2 normal, 1/6 systolic murmur, no diastolic murmur, no  rubs, gallops, thrills, or heaves Abdomen: soft, nontender; no hepatosplenomehaly, BS+; abdominal aorta nontender and not dilated by palpation. Back: no CVA tenderness Pulses 2+ Musculoskeletal: full range of motion, normal strength, no joint deformities Extremities: no clubbing cyanosis or edema, Homan's sign negative  Neurologic: grossly nonfocal; Cranial nerves grossly wnl Psychologic: Normal mood and affect   Studies/Labs Reviewed:   EKG:  EKG is ordered today.  ECG (independently read by me): NSR at 64, no ST changes, no ectopy, normal intervals  Recent Labs: BMP Latest Ref Rng & Units 01/14/2020 01/13/2020 01/01/2020  Glucose 70 - 99 mg/dL 122(H) 191(H) 240(H)  BUN 8 - 23 mg/dL 23 21 35(H)  Creatinine 0.44 - 1.00 mg/dL 2.07(H) 1.94(H) 2.22(H)  Sodium 135 - 145 mmol/L 135 130(L) 133(L)  Potassium 3.5 - 5.1 mmol/L 4.2 5.3(H) 4.5  Chloride 98 - 111 mmol/L 103 100 99  CO2 22 - 32 mmol/L 24 21(L) 21(L)  Calcium 8.9 - 10.3 mg/dL 9.1 9.1 9.1     Hepatic Function Latest Ref Rng & Units 12/30/2019  Total Protein 6.5 - 8.1 g/dL 6.4(L)  Albumin 3.5 - 5.0 g/dL 3.5  AST 15 - 41 U/L 53(H)  ALT 0 - 44 U/L 51(H)  Alk Phosphatase 38 - 126 U/L 31(L)  Total Bilirubin 0.3 - 1.2 mg/dL 0.4  Bilirubin, Direct 0.0 - 0.2 mg/dL 0.1    CBC Latest Ref Rng & Units 01/14/2020 01/13/2020 01/01/2020  WBC 4.0 - 10.5 K/uL 7.1 9.9 10.2  Hemoglobin 12.0 - 15.0 g/dL 10.6(L) 11.2(L) 12.6  Hematocrit 36 - 46 % 32.0(L) 34.9(L) 38.3  Platelets 150 - 400 K/uL 318 363 279   Lab Results  Component Value Date   MCV 91.7 01/14/2020   MCV 94.3 01/13/2020   MCV 92.5 01/01/2020   Lab Results  Component Value Date   TSH 0.203 (L) 12/30/2019   Lab Results  Component Value Date   HGBA1C 8.2 (H) 12/30/2019     BNP    Component Value Date/Time   BNP 15.6 09/21/2017 2244    ProBNP No results  found for: PROBNP   Lipid Panel     Component Value Date/Time   CHOL 116 12/31/2019 0325   TRIG 216 (H) 12/31/2019 0325   HDL 26 (L) 12/31/2019 0325   CHOLHDL 4.5 12/31/2019 0325   VLDL 43 (H) 12/31/2019 0325   LDLCALC 47 12/31/2019 0325     RADIOLOGY: No results found.   Additional studies/ records that were reviewed today include:   Prox LAD lesion is 30% stenosed.  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  Post intervention, there is a 5% residual stenosis.   Mild nonobstructive stenosis of 30% in the proximal LAD in the region of the first diagonal and septal perforating artery.  Normal ramus intermediate and left circumflex coronary arteries.  Dominant RCA with total mid distal occlusion of the PDA vessel.  LVEDP 22 mmHg.  RECOMMENDATION: DAPT initially but since no stenting was performed duration may be less than 1 year.  Will DC bivalirudin several hours post procedure.  Hydrate.  Aggressive lipid-lowering therapy with target LDL less than 70 and optimal blood pressure control.    Intervention     ASSESSMENT:    1. ST elevation myocardial infarction involving right coronary artery ([place]): 12/30/2019, PTCA to PDA   2. Coronary artery disease involving native coronary artery of native heart without angina pectoris   3. Hyperlipidemia LDL goal <70   4. Type 2 diabetes mellitus with complication, without long-term  current use of insulin ([place])   5. Hypothyroidism, unspecified type     PLAN:  Jessica [name] is a 69 year old female who developed an acute coronary syndrome on December 30, 2019 and was found to have total mid distal occlusion of the PDA branch of her right coronary artery.  Due to the vessel caliber, stenting was not done but she underwent successful PTCA.  Subsequently, she has felt well and has been on a medical regimen which now has consisted of low-dose isosorbide at 15 mg in addition to carvedilol which is supposed to be 6.25 mg twice a day but apparently  she has only been taking often as a daily dose.  I am recommending discontinuance of carvedilol and in its place I will start metoprolol succinate and provided 25 mg tablet which she will cut in half and take 12.5 mg daily.  She is not having any anginal symptomatology presently.  She continues to be on DAPT with aspirin/Brilinta.  She is diabetic on Jardiance.  She has a history of hypothyroidism and continues to take levothyroxine 75 mcg.  TSH level was 0.2 in June 2021.  She sees Dr. Brigitte Pulse for her primary MD who checks laboratory.  I will see her in 6 months for follow-up evaluation or sooner as needed.   Medication Adjustments/Labs and Tests Ordered: Current medicines are reviewed at length with the patient today.  Concerns regarding medicines are outlined above.  Medication changes, Labs and Tests ordered today are listed in the Patient Instructions below. Patient Instructions  Medication Instructions:  Stop taking your carvedilol START taking metoprolol succinate 12.5 mg (half a tablet) once a day. *If you need a refill on your cardiac medications before your next appointment, please call your pharmacy*   Lab Work: None ordered If you have labs (blood work) drawn today and your tests are completely normal, you will receive your results only by: Marland Kitchen MyChart Message (if you have MyChart) OR . A paper copy in the mail If you have any lab test that is abnormal or we need to change your treatment, we will call you to review the results.   Testing/Procedures: None ordered   Follow-Up: At [facility], you and your health needs are our priority.  As part of our continuing mission to provide you with exceptional heart care, we have created designated Provider Care Teams.  These Care Teams include your primary Cardiologist (physician) and Advanced Practice Providers (APPs -  Physician Assistants and Nurse Practitioners) who all work together to provide you with the care you need, when you need  it.  We recommend signing up for the patient portal called "MyChart".  Sign up information is provided on this After Visit Summary.  MyChart is used to connect with patients for Virtual Visits (Telemedicine).  Patients are able to view lab/test results, encounter notes, upcoming appointments, etc.  Non-urgent messages can be sent to your provider as well.   To learn more about what you can do with MyChart, go to [email or web address].    Your next appointment:   6 month(s)  The format for your next appointment:   In Person  Provider:   Shelva Majestic, MD   Other Instructions None     Signed, Shelva Majestic, MD  05/15/2020 6:03 PM    [facility] [address], [place], [place], [place]  [address] Phone: [phone]

## 2020-05-13 NOTE — ED Triage Notes (Signed)
Patient from home, fell into her counter top with left side of abdomen 2 hours ago.  Patient with two syncopal episodes, bruising to left side of abdomen.  Patient is CAOx 15.  Patient has rigidity to the left side of abdomen. MI 4 months ago, now on [place].

## 2020-05-13 NOTE — H&P (Signed)
Jessica [name] is an 69 y.o. female.   Chief Complaint: abdominal wall hematoma HPI: 69 year old female had a myocardial infarction 4 months ago and has been on Brilinta.  She is visually impaired.  She walked into a countertop around 4 PM today, striking the area above her left hip.  Since then she said a lot of swelling in the area and she passed out at home.  She was evaluated in the emergency department and found to have a large abdominal wall hematoma with acute blood loss anemia.  I was asked to see her for admission.  She complains of localized pain.  Past Medical History:  Diagnosis Date  . Myocardial infarct [facility])     History reviewed. No pertinent surgical history.  No family history on file. Social History:  has no history on file for tobacco use, alcohol use, and drug use.  Allergies: No Known Allergies  (Not in a hospital admission)   Results for orders placed or performed during the hospital encounter of 05/13/20 (from the past 48 hour(s))  Sample to Blood Bank     Status: None   Collection Time: 05/13/20  9:10 PM  Result Value Ref Range   Blood Bank Specimen SAMPLE AVAILABLE FOR TESTING    Sample Expiration      05/14/2020,2359 Performed at [facility] [address] [place], [place] [address]   Type and screen [place]     Status: None (Preliminary result)   Collection Time: 05/13/20  9:10 PM  Result Value Ref Range   ABO/RH(D) PENDING    Antibody Screen PENDING    Sample Expiration      05/16/2020,2359 Performed at [facility], [place] [address] [place], [place] [address]   Comprehensive metabolic panel     Status: Abnormal   Collection Time: 05/13/20  9:11 PM  Result Value Ref Range   Sodium 130 (L) 135 - 145 mmol/L   Potassium 5.3 (H) 3.5 - 5.1 mmol/L   Chloride 99 98 - 111 mmol/L   CO2 24 22 - 32 mmol/L   Glucose, Bld 289 (H) 70 - 99 mg/dL    Comment: Glucose reference range applies only to samples taken after fasting  for at least 8 hours.   BUN 30 (H) 8 - 23 mg/dL   Creatinine, Ser 1.97 (H) 0.44 - 1.00 mg/dL   Calcium 8.5 (L) 8.9 - 10.3 mg/dL   Total Protein 5.9 (L) 6.5 - 8.1 g/dL   Albumin 3.4 (L) 3.5 - 5.0 g/dL   AST 43 (H) 15 - 41 U/L   ALT 54 (H) 0 - 44 U/L   Alkaline Phosphatase 48 38 - 126 U/L   Total Bilirubin 0.6 0.3 - 1.2 mg/dL   GFR, Estimated 25 (L) >60 mL/min   Anion gap 7 5 - 15    Comment: Performed at [facility] [address], [place], [place] [address]  CBC     Status: Abnormal   Collection Time: 05/13/20  9:11 PM  Result Value Ref Range   WBC 10.6 (H) 4.0 - 10.5 K/uL   RBC 2.62 (L) 3.87 - 5.11 MIL/uL   Hemoglobin 7.9 (L) 12.0 - 15.0 g/dL   HCT 25.7 (L) 36 - 46 %   MCV 98.1 80.0 - 100.0 fL   MCH 30.2 26.0 - 34.0 pg   MCHC 30.7 30.0 - 36.0 g/dL   RDW 13.0 11.5 - 15.5 %   Platelets 306 150 -  400 K/uL   nRBC 0.0 0.0 - 0.2 %    Comment: Performed at [facility], [place] [address] [place], [place] [address]  Ethanol     Status: None   Collection Time: 05/13/20  9:11 PM  Result Value Ref Range   Alcohol, Ethyl (B) <10 <10 mg/dL    Comment: (NOTE) Lowest detectable limit for serum alcohol is 10 mg/dL.  For medical purposes only. Performed at [facility], [place] [address] [place], [place] [address]   Lactic acid, plasma     Status: None   Collection Time: 05/13/20  9:11 PM  Result Value Ref Range   Lactic Acid, Venous 1.4 0.5 - 1.9 mmol/L    Comment: Performed at [facility] [address], [place], [place] [address]  Protime-INR     Status: None   Collection Time: 05/13/20  9:11 PM  Result Value Ref Range   Prothrombin Time 13.2 11.4 - 15.2 seconds   INR 1.0 0.8 - 1.2    Comment: (NOTE) INR goal varies based on device and disease states. Performed at [facility], [place] [address], [place], [place] [address]   I-Stat Chem 8, ED     Status: Abnormal   Collection Time: 05/13/20  9:17 PM  Result Value Ref Range   Sodium 133 (L) 135 -  145 mmol/L   Potassium 5.4 (H) 3.5 - 5.1 mmol/L   Chloride 99 98 - 111 mmol/L   BUN 31 (H) 8 - 23 mg/dL   Creatinine, Ser 1.90 (H) 0.44 - 1.00 mg/dL   Glucose, Bld 267 (H) 70 - 99 mg/dL    Comment: Glucose reference range applies only to samples taken after fasting for at least 8 hours.   Calcium, Ion 1.15 1.15 - 1.40 mmol/L   TCO2 23 22 - 32 mmol/L   Hemoglobin 8.2 (L) 12.0 - 15.0 g/dL   HCT 24.0 (L) 36 - 46 %  Urinalysis, Routine w reflex microscopic Urine, Clean Catch     Status: Abnormal   Collection Time: 05/13/20 10:28 PM  Result Value Ref Range   Color, Urine YELLOW YELLOW   APPearance CLEAR CLEAR   Specific Gravity, Urine 1.026 1.005 - 1.030   pH 7.0 5.0 - 8.0   Glucose, UA 50 (A) NEGATIVE mg/dL   Hgb urine dipstick NEGATIVE NEGATIVE   Bilirubin Urine NEGATIVE NEGATIVE   Ketones, ur NEGATIVE NEGATIVE mg/dL   Protein, ur NEGATIVE NEGATIVE mg/dL   Nitrite NEGATIVE NEGATIVE   Leukocytes,Ua NEGATIVE NEGATIVE    Comment: Performed at [facility], [place] [address] [place], [place] [address]  Prepare RBC (crossmatch)     Status: None   Collection Time: 05/13/20 11:40 PM  Result Value Ref Range   Order Confirmation      ORDER PROCESSED BY BLOOD BANK Performed at [facility], [place] [address] [place], [place] [address]    CT HEAD WO CONTRAST  Result Date: 05/13/2020 CLINICAL DATA:  Fall, facial trauma EXAM: CT HEAD WITHOUT CONTRAST TECHNIQUE: Contiguous axial images were obtained from the base of the skull through the vertex without intravenous contrast. COMPARISON:  None. 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 No acute intracranial abnormality. Specifically, no hemorrhage, hydrocephalus, mass lesion, acute infarction, or significant intracranial injury. Vascular: No hyperdense vessel or unexpected calcification. Skull: No acute calvarial abnormality. Sinuses/Orbits: Visualized paranasal sinuses and mastoids clear. Orbital soft tissues  unremarkable. Other: None IMPRESSION: Old left  occipital and posterior temporal infarct. No acute intracranial abnormality. Electronically Signed   By: Rolm Baptise M.D.   On: 05/13/2020 21:40   CT CHEST W CONTRAST  Result Date: 05/13/2020 CLINICAL DATA:  Recent fall with chest and abdominal pain EXAM: CT CHEST, ABDOMEN, AND PELVIS WITH CONTRAST TECHNIQUE: Multidetector CT imaging of the chest, abdomen and pelvis was performed following the standard protocol during bolus administration of intravenous contrast. CONTRAST:  14mL OMNIPAQUE IOHEXOL 300 MG/ML  SOLN COMPARISON:  04/18/2006 FINDINGS: CT CHEST FINDINGS Cardiovascular: Atherosclerotic calcifications are noted in the thoracic aorta and its branches. No aneurysmal dilatation or dissection is noted. No cardiac enlargement is seen. Coronary calcifications are noted. No pulmonary emboli are noted. Mediastinum/Nodes: Thoracic inlet is within normal limits. No hilar or mediastinal adenopathy is noted. The esophagus is within normal limits. Lungs/Pleura: Lungs are well aerated bilaterally. No focal infiltrate or sizable effusion is noted. Stable lingular nodule is seen measuring approximately 14 mm. The overall appearance is similar to that seen on the prior exam. Scattered smaller nodules are noted stable from the prior study. No new sizable nodule is seen. Musculoskeletal: No chest wall mass or suspicious bone lesions identified. CT ABDOMEN PELVIS FINDINGS Hepatobiliary: No focal liver abnormality is seen. Status post cholecystectomy. No biliary dilatation. Pancreas: Unremarkable. No pancreatic ductal dilatation or surrounding inflammatory changes. Spleen: Normal in size without focal abnormality. Adrenals/Urinary Tract: Adrenal glands are within normal limits. Kidneys demonstrate a normal enhancement pattern bilaterally. Normal excretion is noted bilaterally. No renal calculi or urinary tract obstructive changes are seen. The bladder is well distended.  Stomach/Bowel: Scattered diverticular changes noted without evidence of diverticulitis. No obstructive or inflammatory changes of the colon are seen. The appendix is within normal limits. Small bowel and stomach are unremarkable. Vascular/Lymphatic: Aortic atherosclerosis. No enlarged abdominal or pelvic lymph nodes. Reproductive: Status post hysterectomy. No adnexal masses. Other: There is a large mixed attenuation hematoma identified in the left anterior abdominal wall. The acute component measures at least 11.6 x 5.3 cm in greatest transverse and AP dimensions respectively. It extends for approximately 17 cm in craniocaudad projection. Some surrounding edema and older hemorrhage is noted. A few areas of increased attenuation are not identified which may represent acute extravasation during the exam. Possibility of underlying foreign body deserves consideration as well. Correlate with any skin wound. Musculoskeletal: No acute or significant osseous findings. Changes of prior vertebral augmentation at L2 are noted. IMPRESSION: Large left abdominal wall hematoma as described. Some areas of focal increased attenuation are noted which would be difficult to exclude areas of active extravasation. Alternatively these could represent small foreign bodies. Correlate with any superficial skin wound. Stable lingular nodule measuring 14 mm. Follow-up in 6-12 months is recommended to assess for stability. Diverticulosis without diverticulitis. No other focal abnormality is seen. Electronically Signed   By: Inez Catalina M.D.   On: 05/13/2020 21:54   CT CERVICAL SPINE WO CONTRAST  Result Date: 05/13/2020 CLINICAL DATA:  Fall EXAM: CT CERVICAL SPINE WITHOUT CONTRAST TECHNIQUE: Multidetector CT imaging of the cervical spine was performed without intravenous contrast. Multiplanar CT image reconstructions were also generated. COMPARISON:  None. FINDINGS: Alignment: No subluxation Skull base and vertebrae: No acute fracture. No  primary bone lesion or focal pathologic process. Soft tissues and spinal canal: No prevertebral fluid or swelling. No visible canal hematoma. Disc levels: Diffuse degenerative disc and facet disease. Small central disc herniation at C2-3. Moderate-sized central disc herniation at C3-4. Upper chest: No acute findings Other: None  IMPRESSION: Degenerative disc and facet disease.  No acute bony abnormality. Central disc herniations at C2-3 and C3-4 as above. Electronically Signed   By: Rolm Baptise M.D.   On: 05/13/2020 21:44   CT ABDOMEN PELVIS W CONTRAST  Result Date: 05/13/2020 CLINICAL DATA:  Recent fall with chest and abdominal pain EXAM: CT CHEST, ABDOMEN, AND PELVIS WITH CONTRAST TECHNIQUE: Multidetector CT imaging of the chest, abdomen and pelvis was performed following the standard protocol during bolus administration of intravenous contrast. CONTRAST:  46mL OMNIPAQUE IOHEXOL 300 MG/ML  SOLN COMPARISON:  04/18/2006 FINDINGS: CT CHEST FINDINGS Cardiovascular: Atherosclerotic calcifications are noted in the thoracic aorta and its branches. No aneurysmal dilatation or dissection is noted. No cardiac enlargement is seen. Coronary calcifications are noted. No pulmonary emboli are noted. Mediastinum/Nodes: Thoracic inlet is within normal limits. No hilar or mediastinal adenopathy is noted. The esophagus is within normal limits. Lungs/Pleura: Lungs are well aerated bilaterally. No focal infiltrate or sizable effusion is noted. Stable lingular nodule is seen measuring approximately 14 mm. The overall appearance is similar to that seen on the prior exam. Scattered smaller nodules are noted stable from the prior study. No new sizable nodule is seen. Musculoskeletal: No chest wall mass or suspicious bone lesions identified. CT ABDOMEN PELVIS FINDINGS Hepatobiliary: No focal liver abnormality is seen. Status post cholecystectomy. No biliary dilatation. Pancreas: Unremarkable. No pancreatic ductal dilatation or  surrounding inflammatory changes. Spleen: Normal in size without focal abnormality. Adrenals/Urinary Tract: Adrenal glands are within normal limits. Kidneys demonstrate a normal enhancement pattern bilaterally. Normal excretion is noted bilaterally. No renal calculi or urinary tract obstructive changes are seen. The bladder is well distended. Stomach/Bowel: Scattered diverticular changes noted without evidence of diverticulitis. No obstructive or inflammatory changes of the colon are seen. The appendix is within normal limits. Small bowel and stomach are unremarkable. Vascular/Lymphatic: Aortic atherosclerosis. No enlarged abdominal or pelvic lymph nodes. Reproductive: Status post hysterectomy. No adnexal masses. Other: There is a large mixed attenuation hematoma identified in the left anterior abdominal wall. The acute component measures at least 11.6 x 5.3 cm in greatest transverse and AP dimensions respectively. It extends for approximately 17 cm in craniocaudad projection. Some surrounding edema and older hemorrhage is noted. A few areas of increased attenuation are not identified which may represent acute extravasation during the exam. Possibility of underlying foreign body deserves consideration as well. Correlate with any skin wound. Musculoskeletal: No acute or significant osseous findings. Changes of prior vertebral augmentation at L2 are noted. IMPRESSION: Large left abdominal wall hematoma as described. Some areas of focal increased attenuation are noted which would be difficult to exclude areas of active extravasation. Alternatively these could represent small foreign bodies. Correlate with any superficial skin wound. Stable lingular nodule measuring 14 mm. Follow-up in 6-12 months is recommended to assess for stability. Diverticulosis without diverticulitis. No other focal abnormality is seen. Electronically Signed   By: Inez Catalina M.D.   On: 05/13/2020 21:54   DG Pelvis Portable  Result Date:  05/13/2020 CLINICAL DATA:  Trauma EXAM: PORTABLE PELVIS 1-2 VIEWS COMPARISON:  None. FINDINGS: Hip joints and SI joints are symmetric and unremarkable. No acute bony abnormality. Specifically, no fracture, subluxation, or dislocation. IMPRESSION: No acute bony abnormality. Electronically Signed   By: Rolm Baptise M.D.   On: 05/13/2020 21:22   DG Chest Port 1 View  Result Date: 05/13/2020 CLINICAL DATA:  69 year old female with trauma. EXAM: PORTABLE CHEST 1 VIEW COMPARISON:  Chest radiograph dated 01/13/2020. FINDINGS: No focal  consolidation, pleural effusion, or pneumothorax. The cardiac silhouette is within limits. No acute osseous pathology. IMPRESSION: No active disease. Electronically Signed   By: Anner Crete M.D.   On: 05/13/2020 21:22    Review of Systems  Constitutional: Negative for activity change.  HENT:       Left facial contusion from walking into a door frame a few days ago  Eyes:       Chronic visual impairment  Respiratory: Negative.   Cardiovascular: Negative.   Gastrointestinal: Positive for abdominal pain.  Endocrine: Negative.   Genitourinary: Negative.   Musculoskeletal: Negative.   Skin: Negative.   Allergic/Immunologic: Negative.   Neurological: Positive for syncope.  Hematological: Negative.   Psychiatric/Behavioral: Negative.     Blood pressure (!) 89/67, pulse 85, temperature (!) 97.5 F (36.4 C), temperature source Oral, resp. rate 17, height 5\' 1"  (1.549 m), weight 56.2 kg, SpO2 98 %. Physical Exam Constitutional:      Appearance: Normal appearance.  HENT:     Head:     Comments: Evolving ecchymosis left face    Nose: Nose normal.     Mouth/Throat:     Mouth: Mucous membranes are moist.  Eyes:     Pupils: Pupils are equal, round, and reactive to light.  Cardiovascular:     Rate and Rhythm: Normal rate and regular rhythm.     Pulses: Normal pulses.  Pulmonary:     Effort: Pulmonary effort is normal. No respiratory distress.     Breath  sounds: Normal breath sounds. No wheezing.  Abdominal:     General: There is no distension.     Palpations: Abdomen is soft.     Tenderness: There is abdominal tenderness. There is no guarding.     Comments: Large hematoma over left ASIS and extending around the abdominal wall, locally tender at the hematoma with ecchymosis present  Musculoskeletal:     Cervical back: Normal range of motion and neck supple. No tenderness.     Comments: Mild peripheral edema  Skin:    General: Skin is warm.     Capillary Refill: Capillary refill takes 2 to 3 seconds.  Neurological:     Mental Status: She is alert and oriented to person, place, and time.  Psychiatric:        Mood and Affect: Mood normal.      Assessment/Plan Large left abdominal wall hematoma - place binder, hold Brilinta and ASA Acute blood loss anemia - BP is low so we will transfuse 1 unit PRBC now Recent MI - Will ask Cardiology to see since we have to hold her antiplatelet agents DM - SSI  Admit to progressive unit  Zenovia Jarred, MD 05/13/2020, 11:44 PM

## 2020-05-13 NOTE — ED Notes (Signed)
Pt c/o nausea, MD made aware

## 2020-05-13 NOTE — ED Notes (Addendum)
Family looking for pt placement.

## 2020-05-13 NOTE — Progress Notes (Signed)
   05/13/20 1941  Clinical Encounter Type  Visited With Patient not available  Visit Type Trauma  Referral From Nurse  Consult/Referral To Chaplain  The chaplain responded to page. No chaplain services needed at this time. Will follow up as needed.

## 2020-05-14 ENCOUNTER — Encounter (HOSPITAL_COMMUNITY): Payer: Self-pay | Admitting: General Surgery

## 2020-05-14 DIAGNOSIS — I10 Essential (primary) hypertension: Secondary | ICD-10-CM

## 2020-05-14 DIAGNOSIS — T457X5A Adverse effect of anticoagulant antagonists, vitamin K and other coagulants, initial encounter: Secondary | ICD-10-CM

## 2020-05-14 DIAGNOSIS — I2510 Atherosclerotic heart disease of native coronary artery without angina pectoris: Secondary | ICD-10-CM

## 2020-05-14 DIAGNOSIS — S301XXA Contusion of abdominal wall, initial encounter: Principal | ICD-10-CM

## 2020-05-14 LAB — CBC
HCT: 26 % — ABNORMAL LOW (ref 36.0–46.0)
HCT: 25.1 % — ABNORMAL LOW (ref 36.0–46.0)
Hemoglobin: 8.7 g/dL — ABNORMAL LOW (ref 12.0–15.0)
Hemoglobin: 8.3 g/dL — ABNORMAL LOW (ref 12.0–15.0)
MCH: 32.5 pg (ref 26.0–34.0)
MCH: 31.3 pg (ref 26.0–34.0)
MCHC: 33.5 g/dL (ref 30.0–36.0)
MCHC: 33.1 g/dL (ref 30.0–36.0)
MCV: 97 fL (ref 80.0–100.0)
MCV: 94.7 fL (ref 80.0–100.0)
Platelets: 221 10*3/uL (ref 150–400)
Platelets: 216 10*3/uL (ref 150–400)
RBC: 2.68 MIL/uL — ABNORMAL LOW (ref 3.87–5.11)
RBC: 2.65 MIL/uL — ABNORMAL LOW (ref 3.87–5.11)
RDW: 13.3 % (ref 11.5–15.5)
RDW: 13.6 % (ref 11.5–15.5)
WBC: 16.3 10*3/uL — ABNORMAL HIGH (ref 4.0–10.5)
WBC: 14.9 10*3/uL — ABNORMAL HIGH (ref 4.0–10.5)
nRBC: 0 % (ref 0.0–0.2)
nRBC: 0 % (ref 0.0–0.2)

## 2020-05-14 LAB — BASIC METABOLIC PANEL
Anion gap: 8 (ref 5–15)
BUN: 32 mg/dL — ABNORMAL HIGH (ref 8–23)
CO2: 21 mmol/L — ABNORMAL LOW (ref 22–32)
Calcium: 7.8 mg/dL — ABNORMAL LOW (ref 8.9–10.3)
Chloride: 104 mmol/L (ref 98–111)
Creatinine, Ser: 2.07 mg/dL — ABNORMAL HIGH (ref 0.44–1.00)
GFR, Estimated: 24 mL/min — ABNORMAL LOW (ref >60)
Glucose, Bld: 236 mg/dL — ABNORMAL HIGH (ref 70–99)
Potassium: 5.7 mmol/L — ABNORMAL HIGH (ref 3.5–5.1)
Sodium: 133 mmol/L — ABNORMAL LOW (ref 135–145)

## 2020-05-14 LAB — MRSA PCR SCREENING: MRSA by PCR: NEGATIVE

## 2020-05-14 LAB — CBG MONITORING, ED
Glucose-Capillary: 195 mg/dL — ABNORMAL HIGH (ref 70–99)
Glucose-Capillary: 198 mg/dL — ABNORMAL HIGH (ref 70–99)

## 2020-05-14 LAB — ABO/RH: ABO/RH(D): A POS

## 2020-05-14 LAB — HIV ANTIBODY (ROUTINE TESTING W REFLEX): HIV Screen 4th Generation wRfx: NONREACTIVE

## 2020-05-14 LAB — GLUCOSE, CAPILLARY: Glucose-Capillary: 153 mg/dL — ABNORMAL HIGH (ref 70–99)

## 2020-05-14 MED ORDER — ISOSORBIDE DINITRATE 5 MG PO TABS: 15.0000 mg | ORAL_TABLET | Freq: Every day | ORAL | Status: DC

## 2020-05-14 MED ORDER — POTASSIUM CHLORIDE IN NACL 20-0.9 MEQ/L-% IV SOLN
INTRAVENOUS | Status: DC
  Filled 2020-05-14: qty 1000

## 2020-05-14 MED ORDER — LEVOTHYROXINE SODIUM 75 MCG PO TABS
75.0000 ug | ORAL_TABLET | Freq: Every day | ORAL | Status: DC
  Administered 2020-05-14: 75 ug via ORAL
  Filled 2020-05-14: qty 1

## 2020-05-14 MED ORDER — LORAZEPAM 0.5 MG PO TABS
0.5000 mg | ORAL_TABLET | Freq: Every evening | ORAL | Status: DC | PRN
  Administered 2020-05-15: 0.5 mg via ORAL
  Filled 2020-05-14: qty 1

## 2020-05-14 MED ORDER — ROSUVASTATIN CALCIUM 20 MG PO TABS
40.0000 mg | ORAL_TABLET | Freq: Every day | ORAL | Status: DC
  Administered 2020-05-14 – 2020-05-15 (×3): 40 mg via ORAL
  Filled 2020-05-14 (×3): qty 2

## 2020-05-14 MED ORDER — ONDANSETRON 4 MG PO TBDP: 4.0000 mg | ORAL_TABLET | Freq: Four times a day (QID) | ORAL | Status: DC | PRN

## 2020-05-14 MED ORDER — ACETAMINOPHEN 325 MG PO TABS
650.0000 mg | ORAL_TABLET | ORAL | Status: DC | PRN
  Administered 2020-05-14: 650 mg via ORAL
  Filled 2020-05-14: qty 2

## 2020-05-14 MED ORDER — MORPHINE SULFATE (PF) 2 MG/ML IV SOLN: 2.0000 mg | INTRAVENOUS | Status: DC | PRN

## 2020-05-14 MED ORDER — MAGNESIUM OXIDE 400 (241.3 MG) MG PO TABS
200.0000 mg | ORAL_TABLET | Freq: Every day | ORAL | Status: DC
  Administered 2020-05-14 – 2020-05-16 (×3): 200 mg via ORAL
  Filled 2020-05-14 (×3): qty 1

## 2020-05-14 MED ORDER — CARVEDILOL 12.5 MG PO TABS: 12.5000 mg | ORAL_TABLET | Freq: Two times a day (BID) | ORAL | Status: DC

## 2020-05-14 MED ORDER — INSULIN ASPART 100 UNIT/ML ~~LOC~~ SOLN
0.0000 [IU] | Freq: Three times a day (TID) | SUBCUTANEOUS | Status: DC
  Administered 2020-05-14 (×3): 3 [IU] via SUBCUTANEOUS
  Administered 2020-05-15: 5 [IU] via SUBCUTANEOUS
  Administered 2020-05-15: 3 [IU] via SUBCUTANEOUS
  Administered 2020-05-15 – 2020-05-16 (×2): 2 [IU] via SUBCUTANEOUS

## 2020-05-14 MED ORDER — TRAMADOL HCL 50 MG PO TABS: 50.0000 mg | ORAL_TABLET | Freq: Four times a day (QID) | ORAL | Status: DC | PRN

## 2020-05-14 MED ORDER — FLUTICASONE PROPIONATE 50 MCG/ACT NA SUSP: 1.0000 | Freq: Every day | NASAL | Status: DC | PRN

## 2020-05-14 MED ORDER — SERTRALINE HCL 50 MG PO TABS
50.0000 mg | ORAL_TABLET | Freq: Every day | ORAL | Status: DC
  Administered 2020-05-14 – 2020-05-15 (×3): 50 mg via ORAL
  Filled 2020-05-14 (×4): qty 1

## 2020-05-14 MED ORDER — MORPHINE SULFATE (PF) 4 MG/ML IV SOLN: 4.0000 mg | INTRAVENOUS | Status: DC | PRN

## 2020-05-14 MED ORDER — CALCIUM GLUCONATE-NACL 1-0.675 GM/50ML-% IV SOLN
1.0000 g | Freq: Once | INTRAVENOUS | Status: AC
  Administered 2020-05-14: 1000 mg via INTRAVENOUS
  Filled 2020-05-14: qty 50

## 2020-05-14 MED ORDER — LEVOTHYROXINE SODIUM 75 MCG PO TABS
75.0000 ug | ORAL_TABLET | Freq: Every day | ORAL | Status: DC
  Administered 2020-05-15 – 2020-05-16 (×2): 75 ug via ORAL
  Filled 2020-05-14 (×2): qty 1

## 2020-05-14 MED ORDER — SERTRALINE HCL 50 MG PO TABS: 50.0000 mg | ORAL_TABLET | Freq: Every day | ORAL | Status: DC

## 2020-05-14 MED ORDER — NITROGLYCERIN 0.4 MG SL SUBL: 0.4000 mg | SUBLINGUAL_TABLET | SUBLINGUAL | Status: DC | PRN

## 2020-05-14 MED ORDER — LACTATED RINGERS IV SOLN: INTRAVENOUS | Status: DC

## 2020-05-14 MED ORDER — PANTOPRAZOLE SODIUM 40 MG PO TBEC
40.0000 mg | DELAYED_RELEASE_TABLET | Freq: Every day | ORAL | Status: DC
  Administered 2020-05-14 – 2020-05-16 (×3): 40 mg via ORAL
  Filled 2020-05-14 (×3): qty 1

## 2020-05-14 MED ORDER — ONDANSETRON HCL 4 MG/2ML IJ SOLN
4.0000 mg | Freq: Four times a day (QID) | INTRAMUSCULAR | Status: DC | PRN
  Administered 2020-05-14 (×2): 4 mg via INTRAVENOUS
  Filled 2020-05-14 (×2): qty 2

## 2020-05-14 NOTE — Consult Note (Addendum)
The patient has been seen in conjunction with Harlan Stains, PA. All aspects of care have been considered and discussed. The patient has been personally interviewed, examined, and all clinical data has been reviewed.   Discontinue aspirin and Brilinta.  Digital images obtained at the time of STEMI 4 months ago were reviewed.  Very small territory is threatened even if it reoccludes.  At some later date, resume either aspirin or clopidogrel monotherapy daily.  I would favor aspirin.   Cardiology Consultation:   Patient ID: Jessica [name] MRN: [id number]; DOB: 07/10/1951  Admit date: 05/13/2020 Date of Consult: 05/14/2020  Primary Care Provider: Marton Redwood, MD [facility] Cardiologist: Shelva Majestic, MD  [place] Electrophysiologist:  None    Patient Profile:   Jessica [name] is a 69 y.o. female with a hx of CAD status post balloon angioplasty of RPDA 6/21, hypertension, hyperlipidemia, SVT, DM and legally blind who is being seen today for the evaluation of fall on Brilinta at the request of trauma MD.  History of Present Illness:   Jessica [name] is a 69 year old female with past medical history noted above.  She presented to the emergency room on 12/2019 with complaints of worsening shortness of breath and nausea.  There was concern regarding her EKG with subtle ST elevation in leads II, III and aVF along with V5 and V6.  She was taken emergently to the Cath Lab which showed percent stenosis of the RPDA which was treated with balloon angioplasty with restoration of TIMI-3 flow.  She was noted to have mild nonobstructive stenosis of 30% in the proximal LAD in the region of the first diagonal and septal perforating artery.  Post procedure did have a hematoma at the cath insertion site.  Hemoglobin dropped from 12.9 to 9.8 and was transfused 2 units of PRBCs.  She was placed on dual antiplatelet therapy with aspirin and Brilinta.  EF on echocardiogram showed LVEF of 55 to 60% with  grade 1 diastolic dysfunction and moderate akinesis of the left ventricle inferior wall, apical segment and inferior septal walls.  She was continued on Crestor 40 mg daily.   She was readmitted to the hospital after having a sudden onset of shortness of breath several days after discharge with associated chest/epigastric pain.  She was admitted overnight and placed on amlodipine 5 mg daily and Imdur 15 mg daily.  Unfortunately she had episodes of hypotension and called into the office with these complaints.  Her amlodipine was decreased to 2.5 mg daily and Coreg decreased to 6.25 mg BID.  She was last seen in the office by Bunnie Domino on 02/06/2020 and reported feeling much better.  No specific complaints.  She presented to the ER on 10/19 stating that she was walking quickly through her kitchen and accidentally struck her abdomen against the corner of the countertop.  This area began to swell and she felt malaise, fatigue, lightheadedness and had a syncopal episode.  She developed persistent nausea and vomiting afterwards.  Labs on admission showed sodium 133, potassium 5.4, creatinine 1.9, WBC 10.6, hemoglobin 7.9.  CT abdomen pelvis showed a large abdominal wall hematoma.  General surgery was consulted for admission.  Abdominal binder was placed in her aspirin/Brilinta was held on admission.  She was transfused PRBCs with improvement of hemoglobin to 8.7. Blood pressures have been low since admission, she is on continuous IVFs.    Past Medical History:  Diagnosis Date  . Myocardial infarct [facility]     History  reviewed. No pertinent surgical history.   Home Medications:  Prior to Admission medications   Medication Sig Start Date End Date Taking? Authorizing Provider  ASPIRIN LOW DOSE 81 MG EC tablet Take 81 mg by mouth daily. 01/01/20  Yes [provider]  BRILINTA 90 MG TABS tablet Take 90 mg by mouth 2 (two) times daily. 04/08/20  Yes [provider]  carvedilol (COREG)  12.5 MG tablet Take 12.5 mg by mouth 2 (two) times daily with a meal.  03/22/20  Yes [provider]  co-enzyme Q-10 30 MG capsule Take 30 mg by mouth daily.   Yes [provider]  famotidine (PEPCID) 20 MG tablet Take 20 mg by mouth 2 (two) times daily. 04/27/20  Yes [provider]  fluticasone (FLONASE) 50 MCG/ACT nasal spray Place 1 spray into both nostrils daily as needed for allergies.  01/15/20  Yes [provider]  glipiZIDE (GLUCOTROL) 10 MG tablet Take 10 mg by mouth 2 (two) times daily. 05/05/20  Yes [provider]  isosorbide dinitrate (ISORDIL) 30 MG tablet Take 15 mg by mouth daily.  04/08/20  Yes [provider]  levothyroxine (SYNTHROID) 75 MCG tablet Take 75 mcg by mouth daily. 04/03/20  Yes [provider]  LORazepam (ATIVAN) 0.5 MG tablet Take 0.5 mg by mouth at bedtime as needed for anxiety or sleep.  05/05/20  Yes [provider]  magnesium 30 MG tablet Take 30 mg by mouth daily.   Yes [provider]  nitroGLYCERIN (NITROSTAT) 0.4 MG SL tablet Place 0.4 mg under the tongue every 5 (five) minutes as needed for chest pain.  05/12/20  Yes [provider]  pantoprazole (PROTONIX) 40 MG tablet Take 40 mg by mouth daily. 03/22/20  Yes [provider]  rosuvastatin (CRESTOR) 40 MG tablet Take 40 mg by mouth at bedtime.  03/05/20  Yes [provider]  sertraline (ZOLOFT) 50 MG tablet Take 50 mg by mouth at bedtime.  03/06/20  Yes [provider]  JARDIANCE 10 MG TABS tablet Take 10 mg by mouth daily. 05/12/20   [provider]  metoprolol succinate (TOPROL-XL) 25 MG 24 hr tablet Take 12.5 mg by mouth daily. 05/13/20   [provider]    Inpatient Medications: Scheduled Meds: . insulin aspart  0-15 Units Subcutaneous TID WC  . levothyroxine  75 mcg Oral Daily  . magnesium oxide  200 mg Oral Daily  . pantoprazole  40 mg Oral Daily  . rosuvastatin  40 mg  Oral QHS  . sertraline  50 mg Oral QHS   Continuous Infusions: . lactated ringers 75 mL/hr at 05/14/20 0915   PRN Meds: acetaminophen, fluticasone, LORazepam, morphine injection, nitroGLYCERIN, ondansetron **OR** ondansetron (ZOFRAN) IV, traMADol  Allergies:   No Known Allergies  Social History:   Social History   Socioeconomic History  . Marital status: Married    Spouse name: Not on file  . Number of children: Not on file  . Years of education: Not on file  . Highest education level: Not on file  Occupational History  . Not on file  Tobacco Use  . Smoking status: Not on file  Substance and Sexual Activity  . Alcohol use: Not on file  . Drug use: Not on file  . Sexual activity: Not on file  Other Topics Concern  . Not on file  Social History Narrative  . Not on file   Social Determinants of Health   Financial Resource Strain:   .  Difficulty of Paying Living Expenses: Not on file  Food Insecurity:   . Worried About Charity fundraiser in the Last Year: Not on file  . Ran Out of Food in the Last Year: Not on file  Transportation Needs:   . Lack of Transportation (Medical): Not on file  . Lack of Transportation (Non-Medical): Not on file  Physical Activity:   . Days of Exercise per Week: Not on file  . Minutes of Exercise per Session: Not on file  Stress:   . Feeling of Stress : Not on file  Social Connections:   . Frequency of Communication with Friends and Family: Not on file  . Frequency of Social Gatherings with Friends and Family: Not on file  . Attends Religious Services: Not on file  . Active Member of Clubs or Organizations: Not on file  . Attends Archivist Meetings: Not on file  . Marital Status: Not on file  Intimate Partner Violence:   . Fear of Current or Ex-Partner: Not on file  . Emotionally Abused: Not on file  . Physically Abused: Not on file  . Sexually Abused: Not on file    Family History:   Family History  Problem Relation  Age of Onset  . Hypertension Mother   . Hypertension Father      ROS:  Please see the history of present illness.   All other ROS reviewed and negative.     Physical Exam/Data:   Vitals:   05/14/20 1015 05/14/20 1030 05/14/20 1045 05/14/20 1100  BP: 116/63 111/60 98/67 98/62   Pulse: 91 90 92 96  Resp: 16 15 (!) 25 (!) 29  Temp:      TempSrc:      SpO2: 99% 99% 95% 98%  Weight:      Height:        Intake/Output Summary (Last 24 hours) at 05/14/2020 1412 Last data filed at 05/14/2020 0915 Gross per 24 hour  Intake 981.5 ml  Output --  Net 981.5 ml   Last 3 Weights 05/13/2020  Weight (lbs) 124 lb  Weight (kg) 56.246 kg     Body mass index is 23.43 kg/m.  General:  Well nourished, well developed, in no acute distress HEENT: normal Lymph: no adenopathy Neck: no JVD Vascular: No carotid bruits Cardiac:  normal S1, S2; RRR; no murmur  Lungs:  clear to auscultation bilaterally, no wheezing, rhonchi or rales  Abd: Large abd binder in place.  Ext: no edema Musculoskeletal:  No deformities, BUE and BLE strength normal and equal Skin: warm and dry  Neuro:  CNs 2-12 intact, no focal abnormalities noted Psych:  Normal affect   EKG:  The EKG was personally reviewed and demonstrates:  N/a  Telemetry:  Telemetry was personally reviewed and demonstrates:  SR  Relevant CV Studies:  Cath: 12/2019   Prox LAD lesion is 30% stenosed.  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  Post intervention, there is a 5% residual stenosis.   Mild nonobstructive stenosis of 30% in the proximal LAD in the region of the first diagonal and septal perforating artery.  Normal ramus intermediate and left circumflex coronary arteries.  Dominant RCA with total mid distal occlusion of the PDA vessel.  LVEDP 22 mmHg.  RECOMMENDATION: DAPT initially but since no stenting was performed duration may be less than 1 year.  Will DC bivalirudin several hours post procedure.  Hydrate.  Aggressive  lipid-lowering therapy with target LDL less than 70 and optimal blood pressure control.  Diagnostic Dominance: Right  Intervention    Echo: 12/2019  IMPRESSIONS    1. Left ventricular ejection fraction, by estimation, is 50 to 55%. The  left ventricle has low normal function. The left ventricle demonstrates  regional wall motion abnormalities (see scoring diagram/findings for  description). There is mild left  ventricular hypertrophy. Left ventricular diastolic parameters are  consistent with Grade I diastolic dysfunction (impaired relaxation). There  is moderate hypokinesis of the left ventricular, entire inferior wall,  apical segment and inferoseptal wall.  2. Right ventricular systolic function is normal. The right ventricular  size is normal.  3. The mitral valve is grossly normal. No evidence of mitral valve  regurgitation.  4. The aortic valve is tricuspid. Aortic valve regurgitation is not  visualized.  5. The inferior vena cava is normal in size with greater than 50%  respiratory variability, suggesting right atrial pressure of 3 mmHg.   Laboratory Data:  High Sensitivity Troponin:  No results for input(s): TROPONINIHS in the last 720 hours.   Chemistry Recent Labs  Lab 05/13/20 2111 05/13/20 2117 05/14/20 0629  NA 130* 133* 133*  K 5.3* 5.4* 5.7*  CL 99 99 104  CO2 24  --  21*  GLUCOSE 289* 267* 236*  BUN 30* 31* 32*  CREATININE 1.97* 1.90* 2.07*  CALCIUM 8.5*  --  7.8*  GFRNONAA 25*  --  24*  ANIONGAP 7  --  8    Recent Labs  Lab 05/13/20 2111  PROT 5.9*  ALBUMIN 3.4*  AST 43*  ALT 54*  ALKPHOS 48  BILITOT 0.6   Hematology Recent Labs  Lab 05/13/20 2111 05/13/20 2111 05/13/20 2117 05/14/20 0629 05/14/20 1200  WBC 10.6*  --   --  16.3* 14.9*  RBC 2.62*  --   --  2.68* 2.65*  HGB 7.9*   < > 8.2* 8.7* 8.3*  HCT 25.7*   < > 24.0* 26.0* 25.1*  MCV 98.1  --   --  97.0 94.7  MCH 30.2  --   --  32.5 31.3  MCHC 30.7  --   --  33.5 33.1   RDW 13.0  --   --  13.3 13.6  PLT 306  --   --  221 216   < > = values in this interval not displayed.   BNPNo results for input(s): BNP, PROBNP in the last 168 hours.  DDimer No results for input(s): DDIMER in the last 168 hours.   Radiology/Studies:  CT HEAD WO CONTRAST  Result Date: 05/13/2020 CLINICAL DATA:  Fall, facial trauma EXAM: CT HEAD WITHOUT CONTRAST TECHNIQUE: Contiguous axial images were obtained from the base of the skull through the vertex without intravenous contrast. COMPARISON:  None. 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 No acute intracranial abnormality. Specifically, no hemorrhage, hydrocephalus, mass lesion, acute infarction, or significant intracranial injury. Vascular: No hyperdense vessel or unexpected calcification. Skull: No acute calvarial abnormality. Sinuses/Orbits: Visualized paranasal sinuses and mastoids clear. Orbital soft tissues unremarkable. 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 No acute intracranial abnormality. Electronically Signed   By: Rolm Baptise M.D.   On: 05/13/2020 21:40   CT CHEST W CONTRAST  Result Date: 05/13/2020 CLINICAL DATA:  Recent fall with chest and abdominal pain EXAM: CT CHEST, ABDOMEN, AND PELVIS WITH CONTRAST TECHNIQUE: Multidetector CT imaging of the chest, abdomen and pelvis was performed following the standard protocol during bolus administration of intravenous contrast. CONTRAST:  54mL OMNIPAQUE IOHEXOL 300  MG/ML  SOLN COMPARISON:  04/18/2006 FINDINGS: CT CHEST FINDINGS Cardiovascular: Atherosclerotic calcifications are noted in the thoracic aorta and its branches. No aneurysmal dilatation or dissection is noted. No cardiac enlargement is seen. Coronary calcifications are noted. No pulmonary emboli are noted. Mediastinum/Nodes: Thoracic inlet is within normal limits. No hilar or mediastinal adenopathy is noted. The esophagus is within normal limits. Lungs/Pleura:  Lungs are well aerated bilaterally. No focal infiltrate or sizable effusion is noted. Stable lingular nodule is seen measuring approximately 14 mm. The overall appearance is similar to that seen on the prior exam. Scattered smaller nodules are noted stable from the prior study. No new sizable nodule is seen. Musculoskeletal: No chest wall mass or suspicious bone lesions identified. CT ABDOMEN PELVIS FINDINGS Hepatobiliary: No focal liver abnormality is seen. Status post cholecystectomy. No biliary dilatation. Pancreas: Unremarkable. No pancreatic ductal dilatation or surrounding inflammatory changes. Spleen: Normal in size without focal abnormality. Adrenals/Urinary Tract: Adrenal glands are within normal limits. Kidneys demonstrate a normal enhancement pattern bilaterally. Normal excretion is noted bilaterally. No renal calculi or urinary tract obstructive changes are seen. The bladder is well distended. Stomach/Bowel: Scattered diverticular changes noted without evidence of diverticulitis. No obstructive or inflammatory changes of the colon are seen. The appendix is within normal limits. Small bowel and stomach are unremarkable. Vascular/Lymphatic: Aortic atherosclerosis. No enlarged abdominal or pelvic lymph nodes. Reproductive: Status post hysterectomy. No adnexal masses. Other: There is a large mixed attenuation hematoma identified in the left anterior abdominal wall. The acute component measures at least 11.6 x 5.3 cm in greatest transverse and AP dimensions respectively. It extends for approximately 17 cm in craniocaudad projection. Some surrounding edema and older hemorrhage is noted. A few areas of increased attenuation are not identified which may represent acute extravasation during the exam. Possibility of underlying foreign body deserves consideration as well. Correlate with any skin wound. Musculoskeletal: No acute or significant osseous findings. Changes of prior vertebral augmentation at L2 are  noted. IMPRESSION: Large left abdominal wall hematoma as described. Some areas of focal increased attenuation are noted which would be difficult to exclude areas of active extravasation. Alternatively these could represent small foreign bodies. Correlate with any superficial skin wound. Stable lingular nodule measuring 14 mm. Follow-up in 6-12 months is recommended to assess for stability. Diverticulosis without diverticulitis. No other focal abnormality is seen. Electronically Signed   By: Inez Catalina M.D.   On: 05/13/2020 21:54   CT CERVICAL SPINE WO CONTRAST  Result Date: 05/13/2020 CLINICAL DATA:  Fall EXAM: CT CERVICAL SPINE WITHOUT CONTRAST TECHNIQUE: Multidetector CT imaging of the cervical spine was performed without intravenous contrast. Multiplanar CT image reconstructions were also generated. COMPARISON:  None. FINDINGS: Alignment: No subluxation Skull base and vertebrae: No acute fracture. No primary bone lesion or focal pathologic process. Soft tissues and spinal canal: No prevertebral fluid or swelling. No visible canal hematoma. Disc levels: Diffuse degenerative disc and facet disease. Small central disc herniation at C2-3. Moderate-sized central disc herniation at C3-4. Upper chest: No acute findings Other: None IMPRESSION: Degenerative disc and facet disease.  No acute bony abnormality. Central disc herniations at C2-3 and C3-4 as above. Electronically Signed   By: Rolm Baptise M.D.   On: 05/13/2020 21:44   CT ABDOMEN PELVIS W CONTRAST  Result Date: 05/13/2020 CLINICAL DATA:  Recent fall with chest and abdominal pain EXAM: CT CHEST, ABDOMEN, AND PELVIS WITH CONTRAST TECHNIQUE: Multidetector CT imaging of the chest, abdomen and pelvis was performed following the standard protocol  during bolus administration of intravenous contrast. CONTRAST:  66mL OMNIPAQUE IOHEXOL 300 MG/ML  SOLN COMPARISON:  04/18/2006 FINDINGS: CT CHEST FINDINGS Cardiovascular: Atherosclerotic calcifications are noted  in the thoracic aorta and its branches. No aneurysmal dilatation or dissection is noted. No cardiac enlargement is seen. Coronary calcifications are noted. No pulmonary emboli are noted. Mediastinum/Nodes: Thoracic inlet is within normal limits. No hilar or mediastinal adenopathy is noted. The esophagus is within normal limits. Lungs/Pleura: Lungs are well aerated bilaterally. No focal infiltrate or sizable effusion is noted. Stable lingular nodule is seen measuring approximately 14 mm. The overall appearance is similar to that seen on the prior exam. Scattered smaller nodules are noted stable from the prior study. No new sizable nodule is seen. Musculoskeletal: No chest wall mass or suspicious bone lesions identified. CT ABDOMEN PELVIS FINDINGS Hepatobiliary: No focal liver abnormality is seen. Status post cholecystectomy. No biliary dilatation. Pancreas: Unremarkable. No pancreatic ductal dilatation or surrounding inflammatory changes. Spleen: Normal in size without focal abnormality. Adrenals/Urinary Tract: Adrenal glands are within normal limits. Kidneys demonstrate a normal enhancement pattern bilaterally. Normal excretion is noted bilaterally. No renal calculi or urinary tract obstructive changes are seen. The bladder is well distended. Stomach/Bowel: Scattered diverticular changes noted without evidence of diverticulitis. No obstructive or inflammatory changes of the colon are seen. The appendix is within normal limits. Small bowel and stomach are unremarkable. Vascular/Lymphatic: Aortic atherosclerosis. No enlarged abdominal or pelvic lymph nodes. Reproductive: Status post hysterectomy. No adnexal masses. Other: There is a large mixed attenuation hematoma identified in the left anterior abdominal wall. The acute component measures at least 11.6 x 5.3 cm in greatest transverse and AP dimensions respectively. It extends for approximately 17 cm in craniocaudad projection. Some surrounding edema and older  hemorrhage is noted. A few areas of increased attenuation are not identified which may represent acute extravasation during the exam. Possibility of underlying foreign body deserves consideration as well. Correlate with any skin wound. Musculoskeletal: No acute or significant osseous findings. Changes of prior vertebral augmentation at L2 are noted. IMPRESSION: Large left abdominal wall hematoma as described. Some areas of focal increased attenuation are noted which would be difficult to exclude areas of active extravasation. Alternatively these could represent small foreign bodies. Correlate with any superficial skin wound. Stable lingular nodule measuring 14 mm. Follow-up in 6-12 months is recommended to assess for stability. Diverticulosis without diverticulitis. No other focal abnormality is seen. Electronically Signed   By: Inez Catalina M.D.   On: 05/13/2020 21:54   DG Pelvis Portable  Result Date: 05/13/2020 CLINICAL DATA:  Trauma EXAM: PORTABLE PELVIS 1-2 VIEWS COMPARISON:  None. FINDINGS: Hip joints and SI joints are symmetric and unremarkable. No acute bony abnormality. Specifically, no fracture, subluxation, or dislocation. IMPRESSION: No acute bony abnormality. Electronically Signed   By: Rolm Baptise M.D.   On: 05/13/2020 21:22   DG Chest Port 1 View  Result Date: 05/13/2020 CLINICAL DATA:  69 year old female with trauma. EXAM: PORTABLE CHEST 1 VIEW COMPARISON:  Chest radiograph dated 01/13/2020. FINDINGS: No focal consolidation, pleural effusion, or pneumothorax. The cardiac silhouette is within limits. No acute osseous pathology. IMPRESSION: No active disease. Electronically Signed   By: Anner Crete M.D.   On: 05/13/2020 21:22     Assessment and Plan:   WISDOM SEYBOLD is a 69 y.o. female with a hx of CAD status post balloon angioplasty of RPDA 6/21, hypertension, hyperlipidemia, SVT, DM and legally blind who is being seen today for the evaluation of fall  on Brilinta at the request  of trauma MD.  1. Fall with large abd hematoma with anemia: reports walking and hitting her left abd on the countertop in her kitchen, then developed malaise, fatigue and passed out. CT abd with large left abd hematoma. Seen and admitted by Trauma with ASA/Brilinta held on admission.  -- s/p 1 unit PRBCs with H/H improved 8.3/25.1  2. CAD s/p recent angioplasty of RPDA: presented as a STEMI. Placed on DAPT with ASA/Brilinta. These have been held appropriately on admission in the setting hematoma and anemia.  -- continue to hold until when and if cleared by surgery to resume. Would not restart Brilinta, but consider ASA or plavix instead.   3. HTN: had been on coreg 6.25mg  BID and amlodipine 2.5 mg daily. Reports amlodipine has been stopped, and switched from coreg to metoprolol by her PCP.  -- meds held at this time with hypotension  4. HLD: on high dose statin  5. DM: home oral agents held.  -- SSI while inpatient  For questions or updates, please contact [place] Please consult [email or web address] for contact info under    Signed, Reino Bellis, NP  05/14/2020 2:12 PM

## 2020-05-14 NOTE — Progress Notes (Signed)
Orthopedic Tech Progress Note Patient Details:  Jessica [name] Oct 30, 1950 [id number]  Ortho Devices Type of Ortho Device: Abdominal binder Ortho Device/Splint Interventions: Ordered, Application   Post Interventions Patient Tolerated: Well Instructions Provided: Adjustment of device, Care of device, Poper ambulation with device   Jessica [name] P Jessica [name] 05/14/2020, 12:16 AM

## 2020-05-14 NOTE — ED Notes (Signed)
Ordered breakfast 

## 2020-05-14 NOTE — ED Notes (Signed)
Suanne Marker Daughter would like an update [phone]

## 2020-05-14 NOTE — Progress Notes (Signed)
Subjective: Patient laying on her side.  Having some discomfort but otherwise feeling ok.  Has had some nausea since admission along with some emesis.  The zofran has helped.  States her normal BP is in the 90-100s/50-60s.  She has just received her first unit of blood and hgb post transfusion.  She has gone from 7.9 to 8.7.  ROS: See above, otherwise other systems negative  Objective: Vital signs in last 24 hours: Temp:  [97.5 F (36.4 C)-98.1 F (36.7 C)] 98.1 F (36.7 C) (10/20 0535) Pulse Rate:  [77-93] 91 (10/20 0630) Resp:  [14-32] 31 (10/20 0630) BP: (79-140)/(49-84) 86/49 (10/20 0630) SpO2:  [95 %-100 %] 98 % (10/20 0630) Weight:  [56.2 kg] 56.2 kg (10/19 2105)    Intake/Output from previous day: 10/19 0701 - 10/20 0700 In: 315 [Blood:315] Out: -  Intake/Output this shift: No intake/output data recorded.  PE: Gen: NAD HEENT: ecchymosis of left cheek Neck: trachea midline Heart: regular  Lungs: CTAB Abd: ecchymosis of LLQ with some tenderness in this area, ND, +BS, otherwise soft Ext: no edema, MAE, no other injuries noted Neuro: sensation normal throughout Psych: A&Ox3  Lab Results:  Recent Labs    05/13/20 2111 05/13/20 2111 05/13/20 2117 05/14/20 0629  WBC 10.6*  --   --  16.3*  HGB 7.9*   < > 8.2* 8.7*  HCT 25.7*   < > 24.0* 26.0*  PLT 306  --   --  221   < > = values in this interval not displayed.   BMET Recent Labs    05/13/20 2111 05/13/20 2111 05/13/20 2117 05/14/20 0629  NA 130*   < > 133* 133*  K 5.3*   < > 5.4* 5.7*  CL 99   < > 99 104  CO2 24  --   --  21*  GLUCOSE 289*   < > 267* 236*  BUN 30*   < > 31* 32*  CREATININE 1.97*   < > 1.90* 2.07*  CALCIUM 8.5*  --   --  7.8*   < > = values in this interval not displayed.   PT/INR Recent Labs    05/13/20 2111  LABPROT 13.2  INR 1.0   CMP     Component Value Date/Time   NA 133 (L) 05/14/2020 0629   K 5.7 (H) 05/14/2020 0629   CL 104 05/14/2020 0629   CO2 21 (L)  05/14/2020 0629   GLUCOSE 236 (H) 05/14/2020 0629   BUN 32 (H) 05/14/2020 0629   CREATININE 2.07 (H) 05/14/2020 0629   CALCIUM 7.8 (L) 05/14/2020 0629   PROT 5.9 (L) 05/13/2020 2111   ALBUMIN 3.4 (L) 05/13/2020 2111   AST 43 (H) 05/13/2020 2111   ALT 54 (H) 05/13/2020 2111   ALKPHOS 48 05/13/2020 2111   BILITOT 0.6 05/13/2020 2111   GFRNONAA 24 (L) 05/14/2020 0629   Lipase  No results found for: LIPASE     Studies/Results: CT HEAD WO CONTRAST  Result Date: 05/13/2020 CLINICAL DATA:  Fall, facial trauma EXAM: CT HEAD WITHOUT CONTRAST TECHNIQUE: Contiguous axial images were obtained from the base of the skull through the vertex without intravenous contrast. COMPARISON:  None. 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 No acute intracranial abnormality. Specifically, no hemorrhage, hydrocephalus, mass lesion, acute infarction, or significant intracranial injury. Vascular: No hyperdense vessel or unexpected calcification. Skull: No acute calvarial abnormality. Sinuses/Orbits: Visualized paranasal sinuses and mastoids clear. Orbital soft tissues unremarkable.  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 No acute intracranial abnormality. Electronically Signed   By: Rolm Baptise M.D.   On: 05/13/2020 21:40   CT CHEST W CONTRAST  Result Date: 05/13/2020 CLINICAL DATA:  Recent fall with chest and abdominal pain EXAM: CT CHEST, ABDOMEN, AND PELVIS WITH CONTRAST TECHNIQUE: Multidetector CT imaging of the chest, abdomen and pelvis was performed following the standard protocol during bolus administration of intravenous contrast. CONTRAST:  34mL OMNIPAQUE IOHEXOL 300 MG/ML  SOLN COMPARISON:  04/18/2006 FINDINGS: CT CHEST FINDINGS Cardiovascular: Atherosclerotic calcifications are noted in the thoracic aorta and its branches. No aneurysmal dilatation or dissection is noted. No cardiac enlargement is seen. Coronary calcifications are noted. No  pulmonary emboli are noted. Mediastinum/Nodes: Thoracic inlet is within normal limits. No hilar or mediastinal adenopathy is noted. The esophagus is within normal limits. Lungs/Pleura: Lungs are well aerated bilaterally. No focal infiltrate or sizable effusion is noted. Stable lingular nodule is seen measuring approximately 14 mm. The overall appearance is similar to that seen on the prior exam. Scattered smaller nodules are noted stable from the prior study. No new sizable nodule is seen. Musculoskeletal: No chest wall mass or suspicious bone lesions identified. CT ABDOMEN PELVIS FINDINGS Hepatobiliary: No focal liver abnormality is seen. Status post cholecystectomy. No biliary dilatation. Pancreas: Unremarkable. No pancreatic ductal dilatation or surrounding inflammatory changes. Spleen: Normal in size without focal abnormality. Adrenals/Urinary Tract: Adrenal glands are within normal limits. Kidneys demonstrate a normal enhancement pattern bilaterally. Normal excretion is noted bilaterally. No renal calculi or urinary tract obstructive changes are seen. The bladder is well distended. Stomach/Bowel: Scattered diverticular changes noted without evidence of diverticulitis. No obstructive or inflammatory changes of the colon are seen. The appendix is within normal limits. Small bowel and stomach are unremarkable. Vascular/Lymphatic: Aortic atherosclerosis. No enlarged abdominal or pelvic lymph nodes. Reproductive: Status post hysterectomy. No adnexal masses. Other: There is a large mixed attenuation hematoma identified in the left anterior abdominal wall. The acute component measures at least 11.6 x 5.3 cm in greatest transverse and AP dimensions respectively. It extends for approximately 17 cm in craniocaudad projection. Some surrounding edema and older hemorrhage is noted. A few areas of increased attenuation are not identified which may represent acute extravasation during the exam. Possibility of underlying  foreign body deserves consideration as well. Correlate with any skin wound. Musculoskeletal: No acute or significant osseous findings. Changes of prior vertebral augmentation at L2 are noted. IMPRESSION: Large left abdominal wall hematoma as described. Some areas of focal increased attenuation are noted which would be difficult to exclude areas of active extravasation. Alternatively these could represent small foreign bodies. Correlate with any superficial skin wound. Stable lingular nodule measuring 14 mm. Follow-up in 6-12 months is recommended to assess for stability. Diverticulosis without diverticulitis. No other focal abnormality is seen. Electronically Signed   By: Inez Catalina M.D.   On: 05/13/2020 21:54   CT CERVICAL SPINE WO CONTRAST  Result Date: 05/13/2020 CLINICAL DATA:  Fall EXAM: CT CERVICAL SPINE WITHOUT CONTRAST TECHNIQUE: Multidetector CT imaging of the cervical spine was performed without intravenous contrast. Multiplanar CT image reconstructions were also generated. COMPARISON:  None. FINDINGS: Alignment: No subluxation Skull base and vertebrae: No acute fracture. No primary bone lesion or focal pathologic process. Soft tissues and spinal canal: No prevertebral fluid or swelling. No visible canal hematoma. Disc levels: Diffuse degenerative disc and facet disease. Small central disc herniation at C2-3. Moderate-sized central disc herniation at C3-4. Upper chest:  No acute findings Other: None IMPRESSION: Degenerative disc and facet disease.  No acute bony abnormality. Central disc herniations at C2-3 and C3-4 as above. Electronically Signed   By: Rolm Baptise M.D.   On: 05/13/2020 21:44   CT ABDOMEN PELVIS W CONTRAST  Result Date: 05/13/2020 CLINICAL DATA:  Recent fall with chest and abdominal pain EXAM: CT CHEST, ABDOMEN, AND PELVIS WITH CONTRAST TECHNIQUE: Multidetector CT imaging of the chest, abdomen and pelvis was performed following the standard protocol during bolus administration  of intravenous contrast. CONTRAST:  41mL OMNIPAQUE IOHEXOL 300 MG/ML  SOLN COMPARISON:  04/18/2006 FINDINGS: CT CHEST FINDINGS Cardiovascular: Atherosclerotic calcifications are noted in the thoracic aorta and its branches. No aneurysmal dilatation or dissection is noted. No cardiac enlargement is seen. Coronary calcifications are noted. No pulmonary emboli are noted. Mediastinum/Nodes: Thoracic inlet is within normal limits. No hilar or mediastinal adenopathy is noted. The esophagus is within normal limits. Lungs/Pleura: Lungs are well aerated bilaterally. No focal infiltrate or sizable effusion is noted. Stable lingular nodule is seen measuring approximately 14 mm. The overall appearance is similar to that seen on the prior exam. Scattered smaller nodules are noted stable from the prior study. No new sizable nodule is seen. Musculoskeletal: No chest wall mass or suspicious bone lesions identified. CT ABDOMEN PELVIS FINDINGS Hepatobiliary: No focal liver abnormality is seen. Status post cholecystectomy. No biliary dilatation. Pancreas: Unremarkable. No pancreatic ductal dilatation or surrounding inflammatory changes. Spleen: Normal in size without focal abnormality. Adrenals/Urinary Tract: Adrenal glands are within normal limits. Kidneys demonstrate a normal enhancement pattern bilaterally. Normal excretion is noted bilaterally. No renal calculi or urinary tract obstructive changes are seen. The bladder is well distended. Stomach/Bowel: Scattered diverticular changes noted without evidence of diverticulitis. No obstructive or inflammatory changes of the colon are seen. The appendix is within normal limits. Small bowel and stomach are unremarkable. Vascular/Lymphatic: Aortic atherosclerosis. No enlarged abdominal or pelvic lymph nodes. Reproductive: Status post hysterectomy. No adnexal masses. Other: There is a large mixed attenuation hematoma identified in the left anterior abdominal wall. The acute component  measures at least 11.6 x 5.3 cm in greatest transverse and AP dimensions respectively. It extends for approximately 17 cm in craniocaudad projection. Some surrounding edema and older hemorrhage is noted. A few areas of increased attenuation are not identified which may represent acute extravasation during the exam. Possibility of underlying foreign body deserves consideration as well. Correlate with any skin wound. Musculoskeletal: No acute or significant osseous findings. Changes of prior vertebral augmentation at L2 are noted. IMPRESSION: Large left abdominal wall hematoma as described. Some areas of focal increased attenuation are noted which would be difficult to exclude areas of active extravasation. Alternatively these could represent small foreign bodies. Correlate with any superficial skin wound. Stable lingular nodule measuring 14 mm. Follow-up in 6-12 months is recommended to assess for stability. Diverticulosis without diverticulitis. No other focal abnormality is seen. Electronically Signed   By: Inez Catalina M.D.   On: 05/13/2020 21:54   DG Pelvis Portable  Result Date: 05/13/2020 CLINICAL DATA:  Trauma EXAM: PORTABLE PELVIS 1-2 VIEWS COMPARISON:  None. FINDINGS: Hip joints and SI joints are symmetric and unremarkable. No acute bony abnormality. Specifically, no fracture, subluxation, or dislocation. IMPRESSION: No acute bony abnormality. Electronically Signed   By: Rolm Baptise M.D.   On: 05/13/2020 21:22   DG Chest Port 1 View  Result Date: 05/13/2020 CLINICAL DATA:  69 year old female with trauma. EXAM: PORTABLE CHEST 1 VIEW COMPARISON:  Chest radiograph  dated 01/13/2020. FINDINGS: No focal consolidation, pleural effusion, or pneumothorax. The cardiac silhouette is within limits. No acute osseous pathology. IMPRESSION: No active disease. Electronically Signed   By: Anner Crete M.D.   On: 05/13/2020 21:22    Anti-infectives: Anti-infectives (From admission, onward)   None        Assessment/Plan Fall Large left abdominal wall hematoma - Brilinta on hold.  Abdominal binder in place.  Received first unit of pRBCs.  hgb came up some from 7.9 to 8.7.  BP still low in the 70s.  Getting MIVFs at 100cc/hr.  Will recheck CBC at 12pm as she may require more blood product. ABL anemia - cbc at 12.  Will follow.  See above.  Baseline hgb around 11-12 Recent MI - on Brilinta, but will hold.  Cards to see patient given recent MI DM - SSI Congenital blindness  CKD - baseline appears around 1.6-2.0 4 months ago Hypotension - baseline BP around 90s-100 per patient.  BP currently in the 70s.  Will hold antihypertensives for now to give her pressure a chance to recover. FEN - CLD, IVFs VTE - on hold due to above, SCDs ID - none currently needed   LOS: 1 day    Henreitta Cea , [facility] 05/14/2020, 7:13 AM Please see Amion for pager number during day hours 7:00am-4:30pm or 7:00am -11:30am on weekends

## 2020-05-14 NOTE — ED Notes (Signed)
Set patient breakfast tray up patient is resting with call bell in reach got patient straighten up in the bed checked blood sugar it was 195 notified RN of blood sugar

## 2020-05-14 NOTE — ED Notes (Signed)
Unit completed, no rxn noted. Primary RN made aware of same.

## 2020-05-14 NOTE — Progress Notes (Signed)
Pt arrived to unit from ED. VSS, skin assessment revealed bruising to arms, legs, and abdomen. Patient has no complaints of pain. No distress noted at this time. 8 bottles of medications given to daughter (April Foster) to take home. Patient oriented to unit. Will continue to monitor.

## 2020-05-15 ENCOUNTER — Encounter (HOSPITAL_COMMUNITY): Payer: Self-pay | Admitting: General Surgery

## 2020-05-15 ENCOUNTER — Other Ambulatory Visit: Payer: Self-pay

## 2020-05-15 ENCOUNTER — Encounter: Payer: Self-pay | Admitting: Cardiovascular Disease

## 2020-05-15 LAB — BASIC METABOLIC PANEL
Anion gap: 7 (ref 5–15)
BUN: 27 mg/dL — ABNORMAL HIGH (ref 8–23)
CO2: 23 mmol/L (ref 22–32)
Calcium: 8.5 mg/dL — ABNORMAL LOW (ref 8.9–10.3)
Chloride: 106 mmol/L (ref 98–111)
Creatinine, Ser: 2.05 mg/dL — ABNORMAL HIGH (ref 0.44–1.00)
GFR, Estimated: 24 mL/min — ABNORMAL LOW (ref >60)
Glucose, Bld: 145 mg/dL — ABNORMAL HIGH (ref 70–99)
Potassium: 4.3 mmol/L (ref 3.5–5.1)
Sodium: 136 mmol/L (ref 135–145)

## 2020-05-15 LAB — CBC
HCT: 21.3 % — ABNORMAL LOW (ref 36.0–46.0)
HCT: 27.2 % — ABNORMAL LOW (ref 36.0–46.0)
Hemoglobin: 7 g/dL — ABNORMAL LOW (ref 12.0–15.0)
Hemoglobin: 9.1 g/dL — ABNORMAL LOW (ref 12.0–15.0)
MCH: 31.4 pg (ref 26.0–34.0)
MCH: 31.2 pg (ref 26.0–34.0)
MCHC: 32.9 g/dL (ref 30.0–36.0)
MCHC: 33.5 g/dL (ref 30.0–36.0)
MCV: 95.5 fL (ref 80.0–100.0)
MCV: 93.2 fL (ref 80.0–100.0)
Platelets: 184 10*3/uL (ref 150–400)
Platelets: 192 10*3/uL (ref 150–400)
RBC: 2.23 MIL/uL — ABNORMAL LOW (ref 3.87–5.11)
RBC: 2.92 MIL/uL — ABNORMAL LOW (ref 3.87–5.11)
RDW: 13.6 % (ref 11.5–15.5)
RDW: 14.6 % (ref 11.5–15.5)
WBC: 10.3 10*3/uL (ref 4.0–10.5)
WBC: 11.3 10*3/uL — ABNORMAL HIGH (ref 4.0–10.5)
nRBC: 0 % (ref 0.0–0.2)
nRBC: 0 % (ref 0.0–0.2)

## 2020-05-15 LAB — GLUCOSE, CAPILLARY
Glucose-Capillary: 203 mg/dL — ABNORMAL HIGH (ref 70–99)
Glucose-Capillary: 150 mg/dL — ABNORMAL HIGH (ref 70–99)
Glucose-Capillary: 223 mg/dL — ABNORMAL HIGH (ref 70–99)
Glucose-Capillary: 193 mg/dL — ABNORMAL HIGH (ref 70–99)
Glucose-Capillary: 137 mg/dL — ABNORMAL HIGH (ref 70–99)

## 2020-05-15 LAB — HEMOGLOBIN A1C
Hgb A1c MFr Bld: 7.4 % — ABNORMAL HIGH (ref 4.8–5.6)
Mean Plasma Glucose: 166 mg/dL

## 2020-05-15 LAB — PREPARE RBC (CROSSMATCH)

## 2020-05-15 MED ORDER — METOPROLOL SUCCINATE ER 25 MG PO TB24
12.5000 mg | ORAL_TABLET | Freq: Every day | ORAL | Status: DC
  Filled 2020-05-15 (×2): qty 1

## 2020-05-15 MED ORDER — EMPAGLIFLOZIN 10 MG PO TABS
10.0000 mg | ORAL_TABLET | Freq: Every day | ORAL | Status: DC
  Filled 2020-05-15: qty 1

## 2020-05-15 MED ORDER — SODIUM CHLORIDE 0.9% IV SOLUTION: Freq: Once | INTRAVENOUS | Status: AC

## 2020-05-15 MED ORDER — GLIPIZIDE 5 MG PO TABS: 10.0000 mg | ORAL_TABLET | Freq: Two times a day (BID) | ORAL | Status: DC

## 2020-05-15 MED ORDER — CARVEDILOL 12.5 MG PO TABS: 12.5000 mg | ORAL_TABLET | Freq: Two times a day (BID) | ORAL | Status: DC

## 2020-05-15 NOTE — Progress Notes (Signed)
OT Cancellation Note  Patient Details Name: Jessica [name] MRN: [id number] DOB: 1950/11/19   Cancelled Treatment:    Reason Eval/Treat Not Completed: Medical issues which prohibited therapy. Hemoglobin 7.0 this morning, pt awaiting transfusion. Plan to reattempt after transfusion.   Tyrone Schimke, OT Acute Rehabilitation Services Pager: [phone] Office: [phone]  05/15/2020, 9:01 AM

## 2020-05-15 NOTE — Evaluation (Signed)
Physical Therapy Evaluation Patient Details Name: Jessica [name] MRN: [id number] DOB: 12/27/1950 Today's Date: 05/15/2020   History of Present Illness  69 y.o. year-old female with a history of CAD with MI and congenital blindness presenting to the ED with chief complaint of fall against countertop. Pt found to have large left abdominal wall hematoma.  Clinical Impression  Pt presents with generalized weakness, impaired standing balance, abdominal pain, and decreased activity tolerance vs baseline. Pt to benefit from acute PT to address deficits. Pt ambulated good hallway distance, utilizing PT hand held assist and occasional environment to self-steady. Per pt, her vision has been declining over the past 2 years and has a history of "stumbles". PT recommends OPPT post-acutely to address balance and strength deficits, and to decrease fall risk. PT to progress mobility as tolerated, and will continue to follow acutely.      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  Ambulation/Gait Ambulation/Gait assistance: Min guard Gait Distance (Feet): 175 Feet Assistive device: 1 person hand held assist Gait Pattern/deviations: Step-through  pattern;Decreased stride length;Trunk flexed Gait velocity: decr   General Gait Details: min guard for safety, pt utilizing HHA from PT to self-steady. Pt occasionally reaching for environment, due to imbalance and low vision.  Stairs            Wheelchair Mobility    Modified Rankin (Stroke Patients Only)       Balance Overall balance assessment: Needs assistance;History of Falls ("stumbles") Sitting-balance support: No upper extremity supported;Feet supported Sitting balance-Leahy Scale: Good     Standing balance support: No upper extremity supported Standing balance-Leahy Scale: Fair                               Pertinent Vitals/Pain Pain Assessment: 0-10 Pain Score: 8  Pain Location: abdomen Pain Descriptors / Indicators: Sore;Tightness Pain Intervention(s): Limited activity within patient's tolerance;Monitored during session;Repositioned    Home Living Family/patient expects to be discharged to:: Private residence Living Arrangements: Spouse/significant other Available Help at Discharge: Family Type of Home: House Home Access: Stairs to enter   Technical brewer of Steps: 2 Home Layout: Two level;Able to live on main level with bedroom/bathroom Home Equipment: Kasandra Knudsen - single point;Shower seat      Prior Function Level of Independence: Independent         Comments: pt likes walking, gardening with her husband, and taking care of her 31 year old blue heeler     Hand Dominance   Dominant Hand: Right    Extremity/Trunk Assessment   Upper Extremity Assessment Upper Extremity Assessment: Defer to OT evaluation    Lower Extremity Assessment Lower Extremity Assessment: Generalized weakness  Cervical / Trunk Assessment Cervical / Trunk Assessment: Normal  Communication   Communication: No difficulties  Cognition Arousal/Alertness: Awake/alert Behavior During Therapy: WFL for tasks assessed/performed Overall Cognitive Status:  Within Functional Limits for tasks assessed                                        General Comments      Exercises     Assessment/Plan    PT Assessment Patient needs continued PT services  PT Problem List Decreased strength;Decreased mobility;Decreased activity tolerance;Decreased balance;Decreased knowledge of use of DME;Pain       PT Treatment Interventions DME instruction;Therapeutic activities;Gait training;Therapeutic exercise;Patient/family education;Balance training;Stair training;Functional mobility training;Neuromuscular re-education    PT Goals (Current goals can be found in the Care Plan section)  Acute Rehab PT Goals Patient Stated Goal: home PT Goal Formulation: With patient Time For Goal Achievement: 05/29/20 Potential to Achieve Goals: Good    Frequency Min 3X/week   Barriers to discharge        Co-evaluation               AM-PAC PT "6 Clicks" Mobility  Outcome Measure Help needed turning from your back to your side while in a flat bed without using bedrails?: A Little Help needed moving from lying on your back to sitting on the side of a flat bed without using bedrails?: A Little Help needed moving to and from a bed to a chair (including a wheelchair)?: A Little Help needed standing up from a chair using your arms (e.g., wheelchair or bedside chair)?: A Little Help needed to walk in hospital room?: A Little Help needed climbing 3-5 steps with a railing? : A Little 6 Click Score: 18    End of Session Equipment Utilized During Treatment: Other (comment) (abd binder) Activity Tolerance: Patient tolerated treatment well Patient left: in chair;with call bell/phone within reach;Other (comment) (OT present in room) Nurse Communication: Mobility status PT Visit Diagnosis: Unsteadiness on feet (R26.81);Pain Pain - Right/Left: Left Pain - part of body:  (abdomen)    Time: 5409-8119 PT Time Calculation (min) (ACUTE ONLY): 16  min   Charges:   PT Evaluation $PT Eval Low Complexity: 1 Low        Deepika Decatur E, PT Acute Rehabilitation Services Pager [phone]  Office [phone]   Azayla Polo D [name] 05/15/2020, 2:56 PM

## 2020-05-15 NOTE — Progress Notes (Signed)
Subjective: No new complaints.  Not really having much abdominal pain.  Hungry.  Nausea improved.  Tolerating liquids.  Hasn't mobilized  ROS: See above, otherwise other systems negative  Objective: Vital signs in last 24 hours: Temp:  [97.7 F (36.5 C)-98.4 F (36.9 C)] 98 F (36.7 C) (10/21 0812) Pulse Rate:  [78-101] 78 (10/21 0812) Resp:  [13-29] 17 (10/21 0812) BP: (98-137)/(53-110) 129/65 (10/21 0812) SpO2:  [95 %-100 %] 100 % (10/21 0812) Last BM Date: 05/13/20  Intake/Output from previous day: 10/20 0701 - 10/21 0700 In: 2205.6 [I.V.:2205.6] Out: 600 [Urine:600] Intake/Output this shift: No intake/output data recorded.  PE: Gen: NAD HEENT: ecchymosis of left cheek improved Neck: trachea midline Heart: regular  Lungs: CTAB Abd: ecchymosis of LLQ with some tenderness in this area and some firmness noted, ND, +BS, otherwise soft Ext: no edema, MAE, no other injuries noted Neuro: sensation normal throughout Psych: A&Ox3  Lab Results:  Recent Labs    05/14/20 1200 05/15/20 0201  WBC 14.9* 10.3  HGB 8.3* 7.0*  HCT 25.1* 21.3*  PLT 216 184   BMET Recent Labs    05/14/20 0629 05/15/20 0201  NA 133* 136  K 5.7* 4.3  CL 104 106  CO2 21* 23  GLUCOSE 236* 145*  BUN 32* 27*  CREATININE 2.07* 2.05*  CALCIUM 7.8* 8.5*   PT/INR Recent Labs    05/13/20 2111  LABPROT 13.2  INR 1.0   CMP     Component Value Date/Time   NA 136 05/15/2020 0201   K 4.3 05/15/2020 0201   CL 106 05/15/2020 0201   CO2 23 05/15/2020 0201   GLUCOSE 145 (H) 05/15/2020 0201   BUN 27 (H) 05/15/2020 0201   CREATININE 2.05 (H) 05/15/2020 0201   CALCIUM 8.5 (L) 05/15/2020 0201   PROT 5.9 (L) 05/13/2020 2111   ALBUMIN 3.4 (L) 05/13/2020 2111   AST 43 (H) 05/13/2020 2111   ALT 54 (H) 05/13/2020 2111   ALKPHOS 48 05/13/2020 2111   BILITOT 0.6 05/13/2020 2111   GFRNONAA 24 (L) 05/15/2020 0201   Lipase  No results found for: LIPASE     Studies/Results: CT HEAD  WO CONTRAST  Result Date: 05/13/2020 CLINICAL DATA:  Fall, facial trauma EXAM: CT HEAD WITHOUT CONTRAST TECHNIQUE: Contiguous axial images were obtained from the base of the skull through the vertex without intravenous contrast. COMPARISON:  None. 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 No acute intracranial abnormality. Specifically, no hemorrhage, hydrocephalus, mass lesion, acute infarction, or significant intracranial injury. Vascular: No hyperdense vessel or unexpected calcification. Skull: No acute calvarial abnormality. Sinuses/Orbits: Visualized paranasal sinuses and mastoids clear. Orbital soft tissues unremarkable. 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 No acute intracranial abnormality. Electronically Signed   By: Rolm Baptise M.D.   On: 05/13/2020 21:40   CT CHEST W CONTRAST  Result Date: 05/13/2020 CLINICAL DATA:  Recent fall with chest and abdominal pain EXAM: CT CHEST, ABDOMEN, AND PELVIS WITH CONTRAST TECHNIQUE: Multidetector CT imaging of the chest, abdomen and pelvis was performed following the standard protocol during bolus administration of intravenous contrast. CONTRAST:  76mL OMNIPAQUE IOHEXOL 300 MG/ML  SOLN COMPARISON:  04/18/2006 FINDINGS: CT CHEST FINDINGS Cardiovascular: Atherosclerotic calcifications are noted in the thoracic aorta and its branches. No aneurysmal dilatation or dissection is noted. No cardiac enlargement is seen. Coronary calcifications are noted. No pulmonary emboli are noted. Mediastinum/Nodes: Thoracic inlet is within normal limits. No hilar or  mediastinal adenopathy is noted. The esophagus is within normal limits. Lungs/Pleura: Lungs are well aerated bilaterally. No focal infiltrate or sizable effusion is noted. Stable lingular nodule is seen measuring approximately 14 mm. The overall appearance is similar to that seen on the prior exam. Scattered smaller nodules are noted stable from the prior  study. No new sizable nodule is seen. Musculoskeletal: No chest wall mass or suspicious bone lesions identified. CT ABDOMEN PELVIS FINDINGS Hepatobiliary: No focal liver abnormality is seen. Status post cholecystectomy. No biliary dilatation. Pancreas: Unremarkable. No pancreatic ductal dilatation or surrounding inflammatory changes. Spleen: Normal in size without focal abnormality. Adrenals/Urinary Tract: Adrenal glands are within normal limits. Kidneys demonstrate a normal enhancement pattern bilaterally. Normal excretion is noted bilaterally. No renal calculi or urinary tract obstructive changes are seen. The bladder is well distended. Stomach/Bowel: Scattered diverticular changes noted without evidence of diverticulitis. No obstructive or inflammatory changes of the colon are seen. The appendix is within normal limits. Small bowel and stomach are unremarkable. Vascular/Lymphatic: Aortic atherosclerosis. No enlarged abdominal or pelvic lymph nodes. Reproductive: Status post hysterectomy. No adnexal masses. Other: There is a large mixed attenuation hematoma identified in the left anterior abdominal wall. The acute component measures at least 11.6 x 5.3 cm in greatest transverse and AP dimensions respectively. It extends for approximately 17 cm in craniocaudad projection. Some surrounding edema and older hemorrhage is noted. A few areas of increased attenuation are not identified which may represent acute extravasation during the exam. Possibility of underlying foreign body deserves consideration as well. Correlate with any skin wound. Musculoskeletal: No acute or significant osseous findings. Changes of prior vertebral augmentation at L2 are noted. IMPRESSION: Large left abdominal wall hematoma as described. Some areas of focal increased attenuation are noted which would be difficult to exclude areas of active extravasation. Alternatively these could represent small foreign bodies. Correlate with any superficial  skin wound. Stable lingular nodule measuring 14 mm. Follow-up in 6-12 months is recommended to assess for stability. Diverticulosis without diverticulitis. No other focal abnormality is seen. Electronically Signed   By: Inez Catalina M.D.   On: 05/13/2020 21:54   CT CERVICAL SPINE WO CONTRAST  Result Date: 05/13/2020 CLINICAL DATA:  Fall EXAM: CT CERVICAL SPINE WITHOUT CONTRAST TECHNIQUE: Multidetector CT imaging of the cervical spine was performed without intravenous contrast. Multiplanar CT image reconstructions were also generated. COMPARISON:  None. FINDINGS: Alignment: No subluxation Skull base and vertebrae: No acute fracture. No primary bone lesion or focal pathologic process. Soft tissues and spinal canal: No prevertebral fluid or swelling. No visible canal hematoma. Disc levels: Diffuse degenerative disc and facet disease. Small central disc herniation at C2-3. Moderate-sized central disc herniation at C3-4. Upper chest: No acute findings Other: None IMPRESSION: Degenerative disc and facet disease.  No acute bony abnormality. Central disc herniations at C2-3 and C3-4 as above. Electronically Signed   By: Rolm Baptise M.D.   On: 05/13/2020 21:44   CT ABDOMEN PELVIS W CONTRAST  Result Date: 05/13/2020 CLINICAL DATA:  Recent fall with chest and abdominal pain EXAM: CT CHEST, ABDOMEN, AND PELVIS WITH CONTRAST TECHNIQUE: Multidetector CT imaging of the chest, abdomen and pelvis was performed following the standard protocol during bolus administration of intravenous contrast. CONTRAST:  97mL OMNIPAQUE IOHEXOL 300 MG/ML  SOLN COMPARISON:  04/18/2006 FINDINGS: CT CHEST FINDINGS Cardiovascular: Atherosclerotic calcifications are noted in the thoracic aorta and its branches. No aneurysmal dilatation or dissection is noted. No cardiac enlargement is seen. Coronary calcifications are noted. No pulmonary emboli  are noted. Mediastinum/Nodes: Thoracic inlet is within normal limits. No hilar or mediastinal  adenopathy is noted. The esophagus is within normal limits. Lungs/Pleura: Lungs are well aerated bilaterally. No focal infiltrate or sizable effusion is noted. Stable lingular nodule is seen measuring approximately 14 mm. The overall appearance is similar to that seen on the prior exam. Scattered smaller nodules are noted stable from the prior study. No new sizable nodule is seen. Musculoskeletal: No chest wall mass or suspicious bone lesions identified. CT ABDOMEN PELVIS FINDINGS Hepatobiliary: No focal liver abnormality is seen. Status post cholecystectomy. No biliary dilatation. Pancreas: Unremarkable. No pancreatic ductal dilatation or surrounding inflammatory changes. Spleen: Normal in size without focal abnormality. Adrenals/Urinary Tract: Adrenal glands are within normal limits. Kidneys demonstrate a normal enhancement pattern bilaterally. Normal excretion is noted bilaterally. No renal calculi or urinary tract obstructive changes are seen. The bladder is well distended. Stomach/Bowel: Scattered diverticular changes noted without evidence of diverticulitis. No obstructive or inflammatory changes of the colon are seen. The appendix is within normal limits. Small bowel and stomach are unremarkable. Vascular/Lymphatic: Aortic atherosclerosis. No enlarged abdominal or pelvic lymph nodes. Reproductive: Status post hysterectomy. No adnexal masses. Other: There is a large mixed attenuation hematoma identified in the left anterior abdominal wall. The acute component measures at least 11.6 x 5.3 cm in greatest transverse and AP dimensions respectively. It extends for approximately 17 cm in craniocaudad projection. Some surrounding edema and older hemorrhage is noted. A few areas of increased attenuation are not identified which may represent acute extravasation during the exam. Possibility of underlying foreign body deserves consideration as well. Correlate with any skin wound. Musculoskeletal: No acute or significant  osseous findings. Changes of prior vertebral augmentation at L2 are noted. IMPRESSION: Large left abdominal wall hematoma as described. Some areas of focal increased attenuation are noted which would be difficult to exclude areas of active extravasation. Alternatively these could represent small foreign bodies. Correlate with any superficial skin wound. Stable lingular nodule measuring 14 mm. Follow-up in 6-12 months is recommended to assess for stability. Diverticulosis without diverticulitis. No other focal abnormality is seen. Electronically Signed   By: Inez Catalina M.D.   On: 05/13/2020 21:54   DG Pelvis Portable  Result Date: 05/13/2020 CLINICAL DATA:  Trauma EXAM: PORTABLE PELVIS 1-2 VIEWS COMPARISON:  None. FINDINGS: Hip joints and SI joints are symmetric and unremarkable. No acute bony abnormality. Specifically, no fracture, subluxation, or dislocation. IMPRESSION: No acute bony abnormality. Electronically Signed   By: Rolm Baptise M.D.   On: 05/13/2020 21:22   DG Chest Port 1 View  Result Date: 05/13/2020 CLINICAL DATA:  69 year old female with trauma. EXAM: PORTABLE CHEST 1 VIEW COMPARISON:  Chest radiograph dated 01/13/2020. FINDINGS: No focal consolidation, pleural effusion, or pneumothorax. The cardiac silhouette is within limits. No acute osseous pathology. IMPRESSION: No active disease. Electronically Signed   By: Anner Crete M.D.   On: 05/13/2020 21:22    Anti-infectives: Anti-infectives (From admission, onward)   None       Assessment/Plan Fall Large left abdominal wall hematoma - Brilinta stopped per cards.  Will needs just ASA at some point in the future, but really when they determine timing per their note.  Appreciate their evaluation.  Abdominal binder in place.  Received first unit of pRBCs.  hgb responded yesterday after this first unit, but fell again today to 7.  Will transfuse 1 more unit pRBCs. ABL anemia - hgb 7, transfuse 1 unit, check cbc in am Recent MI -  Brilinta stopped.  Appreciate cards eval. DM - SSI, restart home meds Congenital blindness  CKD - baseline appears around 1.6-2.0 4 months ago, 2.05 today, follow Hypotension - resolved, likely to restart home meds FEN - SLIV, vegetarian diet VTE - on hold due to above, SCDs ID - none currently needed   LOS: 2 days    Henreitta Cea , [facility] 05/15/2020, 8:33 AM Please see Amion for pager number during day hours 7:00am-4:30pm or 7:00am -11:30am on weekends

## 2020-05-15 NOTE — Progress Notes (Signed)
Patient completed PRBC w/o issue. Ambulated patient to the bedside chair, tolerated well. No distress is noted at this time. Will continue to monitor.

## 2020-05-15 NOTE — Progress Notes (Addendum)
The patient has been seen in conjunction with Harlan Stains, NP. All aspects of care have been considered and discussed. The patient has been personally interviewed, examined, and all clinical data has been reviewed.   Agree with note.  I would favor aspirin once per day when safe to resume antiplatelet therapy from the standpoint of bleeding.  [facility] will sign off.   Medication Recommendations: Aspirin 81 mg/day when safe Other recommendations (labs, testing, etc): None Follow up as an outpatient: As previously scheduled    Progress Note  Patient Name: Jessica [name] Date of Encounter: 05/15/2020  [facility] Cardiologist: Shelva Majestic, MD   Subjective   Up in the chair.   Inpatient Medications    Scheduled Meds: . insulin aspart  0-15 Units Subcutaneous TID WC  . levothyroxine  75 mcg Oral Q0600  . magnesium oxide  200 mg Oral Daily  . metoprolol succinate  12.5 mg Oral Daily  . pantoprazole  40 mg Oral Daily  . rosuvastatin  40 mg Oral QHS  . sertraline  50 mg Oral QHS   Continuous Infusions:  PRN Meds: acetaminophen, fluticasone, LORazepam, nitroGLYCERIN, ondansetron **OR** ondansetron (ZOFRAN) IV, traMADol   Vital Signs    Vitals:   05/15/20 0859 05/15/20 0930 05/15/20 1102 05/15/20 1206  BP: 119/64 134/65 (!) 104/57 116/63  Pulse: 86  94 94  Resp: 17  19 20   Temp: 98.6 F (37 C) 98.1 F (36.7 C) 99.4 F (37.4 C) 97.9 F (36.6 C)  TempSrc: Oral Axillary Oral Oral  SpO2:  96% 100% 100%  Weight:      Height:        Intake/Output Summary (Last 24 hours) at 05/15/2020 1208 Last data filed at 05/15/2020 1102 Gross per 24 hour  Intake 1539.07 ml  Output 600 ml  Net 939.07 ml   Last 3 Weights 05/13/2020  Weight (lbs) 124 lb  Weight (kg) 56.246 kg      Telemetry    SR-->ST - Personally Reviewed  ECG    No new tracing.  Physical Exam   GEN: No acute distress.   Neck: No JVD Cardiac: RRR, no murmurs, rubs, or gallops.    Respiratory: Clear to auscultation bilaterally. GI: Abd binder in place MS: No edema; No deformity. Neuro:  Nonfocal  Psych: Normal affect   Labs    High Sensitivity Troponin:  No results for input(s): TROPONINIHS in the last 720 hours.    Chemistry Recent Labs  Lab 05/13/20 2111 05/13/20 2111 05/13/20 2117 05/14/20 0629 05/15/20 0201  NA 130*   < > 133* 133* 136  K 5.3*   < > 5.4* 5.7* 4.3  CL 99   < > 99 104 106  CO2 24  --   --  21* 23  GLUCOSE 289*   < > 267* 236* 145*  BUN 30*   < > 31* 32* 27*  CREATININE 1.97*   < > 1.90* 2.07* 2.05*  CALCIUM 8.5*  --   --  7.8* 8.5*  PROT 5.9*  --   --   --   --   ALBUMIN 3.4*  --   --   --   --   AST 43*  --   --   --   --   ALT 54*  --   --   --   --   ALKPHOS 48  --   --   --   --   BILITOT 0.6  --   --   --   --  GFRNONAA 25*  --   --  24* 24*  ANIONGAP 7  --   --  8 7   < > = values in this interval not displayed.     Hematology Recent Labs  Lab 05/14/20 0629 05/14/20 1200 05/15/20 0201  WBC 16.3* 14.9* 10.3  RBC 2.68* 2.65* 2.23*  HGB 8.7* 8.3* 7.0*  HCT 26.0* 25.1* 21.3*  MCV 97.0 94.7 95.5  MCH 32.5 31.3 31.4  MCHC 33.5 33.1 32.9  RDW 13.3 13.6 13.6  PLT 221 216 184    BNPNo results for input(s): BNP, PROBNP in the last 168 hours.   DDimer No results for input(s): DDIMER in the last 168 hours.   Radiology    CT HEAD WO CONTRAST  Result Date: 05/13/2020 CLINICAL DATA:  Fall, facial trauma EXAM: CT HEAD WITHOUT CONTRAST TECHNIQUE: Contiguous axial images were obtained from the base of the skull through the vertex without intravenous contrast. COMPARISON:  None. 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 No acute intracranial abnormality. Specifically, no hemorrhage, hydrocephalus, mass lesion, acute infarction, or significant intracranial injury. Vascular: No hyperdense vessel or unexpected calcification. Skull: No acute calvarial abnormality. Sinuses/Orbits: Visualized paranasal  sinuses and mastoids clear. Orbital soft tissues unremarkable. 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 No acute intracranial abnormality. Electronically Signed   By: Rolm Baptise M.D.   On: 05/13/2020 21:40   CT CHEST W CONTRAST  Result Date: 05/13/2020 CLINICAL DATA:  Recent fall with chest and abdominal pain EXAM: CT CHEST, ABDOMEN, AND PELVIS WITH CONTRAST TECHNIQUE: Multidetector CT imaging of the chest, abdomen and pelvis was performed following the standard protocol during bolus administration of intravenous contrast. CONTRAST:  12mL OMNIPAQUE IOHEXOL 300 MG/ML  SOLN COMPARISON:  04/18/2006 FINDINGS: CT CHEST FINDINGS Cardiovascular: Atherosclerotic calcifications are noted in the thoracic aorta and its branches. No aneurysmal dilatation or dissection is noted. No cardiac enlargement is seen. Coronary calcifications are noted. No pulmonary emboli are noted. Mediastinum/Nodes: Thoracic inlet is within normal limits. No hilar or mediastinal adenopathy is noted. The esophagus is within normal limits. Lungs/Pleura: Lungs are well aerated bilaterally. No focal infiltrate or sizable effusion is noted. Stable lingular nodule is seen measuring approximately 14 mm. The overall appearance is similar to that seen on the prior exam. Scattered smaller nodules are noted stable from the prior study. No new sizable nodule is seen. Musculoskeletal: No chest wall mass or suspicious bone lesions identified. CT ABDOMEN PELVIS FINDINGS Hepatobiliary: No focal liver abnormality is seen. Status post cholecystectomy. No biliary dilatation. Pancreas: Unremarkable. No pancreatic ductal dilatation or surrounding inflammatory changes. Spleen: Normal in size without focal abnormality. Adrenals/Urinary Tract: Adrenal glands are within normal limits. Kidneys demonstrate a normal enhancement pattern bilaterally. Normal excretion is noted bilaterally. No renal calculi or urinary tract obstructive  changes are seen. The bladder is well distended. Stomach/Bowel: Scattered diverticular changes noted without evidence of diverticulitis. No obstructive or inflammatory changes of the colon are seen. The appendix is within normal limits. Small bowel and stomach are unremarkable. Vascular/Lymphatic: Aortic atherosclerosis. No enlarged abdominal or pelvic lymph nodes. Reproductive: Status post hysterectomy. No adnexal masses. Other: There is a large mixed attenuation hematoma identified in the left anterior abdominal wall. The acute component measures at least 11.6 x 5.3 cm in greatest transverse and AP dimensions respectively. It extends for approximately 17 cm in craniocaudad projection. Some surrounding edema and older hemorrhage is noted. A few areas of increased attenuation are not identified which may  represent acute extravasation during the exam. Possibility of underlying foreign body deserves consideration as well. Correlate with any skin wound. Musculoskeletal: No acute or significant osseous findings. Changes of prior vertebral augmentation at L2 are noted. IMPRESSION: Large left abdominal wall hematoma as described. Some areas of focal increased attenuation are noted which would be difficult to exclude areas of active extravasation. Alternatively these could represent small foreign bodies. Correlate with any superficial skin wound. Stable lingular nodule measuring 14 mm. Follow-up in 6-12 months is recommended to assess for stability. Diverticulosis without diverticulitis. No other focal abnormality is seen. Electronically Signed   By: Inez Catalina M.D.   On: 05/13/2020 21:54   CT CERVICAL SPINE WO CONTRAST  Result Date: 05/13/2020 CLINICAL DATA:  Fall EXAM: CT CERVICAL SPINE WITHOUT CONTRAST TECHNIQUE: Multidetector CT imaging of the cervical spine was performed without intravenous contrast. Multiplanar CT image reconstructions were also generated. COMPARISON:  None. FINDINGS: Alignment: No subluxation  Skull base and vertebrae: No acute fracture. No primary bone lesion or focal pathologic process. Soft tissues and spinal canal: No prevertebral fluid or swelling. No visible canal hematoma. Disc levels: Diffuse degenerative disc and facet disease. Small central disc herniation at C2-3. Moderate-sized central disc herniation at C3-4. Upper chest: No acute findings Other: None IMPRESSION: Degenerative disc and facet disease.  No acute bony abnormality. Central disc herniations at C2-3 and C3-4 as above. Electronically Signed   By: Rolm Baptise M.D.   On: 05/13/2020 21:44   CT ABDOMEN PELVIS W CONTRAST  Result Date: 05/13/2020 CLINICAL DATA:  Recent fall with chest and abdominal pain EXAM: CT CHEST, ABDOMEN, AND PELVIS WITH CONTRAST TECHNIQUE: Multidetector CT imaging of the chest, abdomen and pelvis was performed following the standard protocol during bolus administration of intravenous contrast. CONTRAST:  43mL OMNIPAQUE IOHEXOL 300 MG/ML  SOLN COMPARISON:  04/18/2006 FINDINGS: CT CHEST FINDINGS Cardiovascular: Atherosclerotic calcifications are noted in the thoracic aorta and its branches. No aneurysmal dilatation or dissection is noted. No cardiac enlargement is seen. Coronary calcifications are noted. No pulmonary emboli are noted. Mediastinum/Nodes: Thoracic inlet is within normal limits. No hilar or mediastinal adenopathy is noted. The esophagus is within normal limits. Lungs/Pleura: Lungs are well aerated bilaterally. No focal infiltrate or sizable effusion is noted. Stable lingular nodule is seen measuring approximately 14 mm. The overall appearance is similar to that seen on the prior exam. Scattered smaller nodules are noted stable from the prior study. No new sizable nodule is seen. Musculoskeletal: No chest wall mass or suspicious bone lesions identified. CT ABDOMEN PELVIS FINDINGS Hepatobiliary: No focal liver abnormality is seen. Status post cholecystectomy. No biliary dilatation. Pancreas:  Unremarkable. No pancreatic ductal dilatation or surrounding inflammatory changes. Spleen: Normal in size without focal abnormality. Adrenals/Urinary Tract: Adrenal glands are within normal limits. Kidneys demonstrate a normal enhancement pattern bilaterally. Normal excretion is noted bilaterally. No renal calculi or urinary tract obstructive changes are seen. The bladder is well distended. Stomach/Bowel: Scattered diverticular changes noted without evidence of diverticulitis. No obstructive or inflammatory changes of the colon are seen. The appendix is within normal limits. Small bowel and stomach are unremarkable. Vascular/Lymphatic: Aortic atherosclerosis. No enlarged abdominal or pelvic lymph nodes. Reproductive: Status post hysterectomy. No adnexal masses. Other: There is a large mixed attenuation hematoma identified in the left anterior abdominal wall. The acute component measures at least 11.6 x 5.3 cm in greatest transverse and AP dimensions respectively. It extends for approximately 17 cm in craniocaudad projection. Some surrounding edema and older hemorrhage is  noted. A few areas of increased attenuation are not identified which may represent acute extravasation during the exam. Possibility of underlying foreign body deserves consideration as well. Correlate with any skin wound. Musculoskeletal: No acute or significant osseous findings. Changes of prior vertebral augmentation at L2 are noted. IMPRESSION: Large left abdominal wall hematoma as described. Some areas of focal increased attenuation are noted which would be difficult to exclude areas of active extravasation. Alternatively these could represent small foreign bodies. Correlate with any superficial skin wound. Stable lingular nodule measuring 14 mm. Follow-up in 6-12 months is recommended to assess for stability. Diverticulosis without diverticulitis. No other focal abnormality is seen. Electronically Signed   By: Inez Catalina M.D.   On: 05/13/2020  21:54   DG Pelvis Portable  Result Date: 05/13/2020 CLINICAL DATA:  Trauma EXAM: PORTABLE PELVIS 1-2 VIEWS COMPARISON:  None. FINDINGS: Hip joints and SI joints are symmetric and unremarkable. No acute bony abnormality. Specifically, no fracture, subluxation, or dislocation. IMPRESSION: No acute bony abnormality. Electronically Signed   By: Rolm Baptise M.D.   On: 05/13/2020 21:22   DG Chest Port 1 View  Result Date: 05/13/2020 CLINICAL DATA:  69 year old female with trauma. EXAM: PORTABLE CHEST 1 VIEW COMPARISON:  Chest radiograph dated 01/13/2020. FINDINGS: No focal consolidation, pleural effusion, or pneumothorax. The cardiac silhouette is within limits. No acute osseous pathology. IMPRESSION: No active disease. Electronically Signed   By: Anner Crete M.D.   On: 05/13/2020 21:22    Cardiac Studies   N/a   Patient Profile     69 y.o. female with a hx of CAD status post balloon angioplasty of RPDA 6/21, hypertension, hyperlipidemia, SVT, DM and legally blind who is being seen today for the evaluation of fall on Brilinta with hematoma at the request of trauma MD.  Assessment & Plan    1. Fall with large abd hematoma with anemia: reports walking and hitting her left abd on the countertop in her kitchen, then developed malaise, fatigue and passed out. CT abd with large left abd hematoma. Seen and admitted by Trauma with ASA/Brilinta held on admission.  -- s/p 2 unit PRBCs now as Hgb 7.0 this morning. Repeat post transfusion pending.  2. CAD s/p recent angioplasty of RPDA: presented as a STEMI. Placed on DAPT with ASA/Brilinta. These have been held appropriately on admission in the setting hematoma and anemia. Films reviewed with MD, small territory.   -- continue to hold until when and if cleared by surgery to resume. Would not restart Brilinta, but consider ASA or plavix instead at later date.  3. HTN: had been on coreg 6.25mg  BID and amlodipine 2.5 mg daily. Reports amlodipine had  been stopped, and switched from coreg to metoprolol by her PCP.  -- meds ordered but held this morning with soft BP  4. HLD: on high dose statin  5. DM: home oral agents held.  -- SSI while inpatient   For questions or updates, please contact [place] Please consult [email or web address] for contact info under        Signed, Reino Bellis, NP  05/15/2020, 12:08 PM

## 2020-05-15 NOTE — TOC CAGE-AID Note (Signed)
Transition of Care [facility]) - CAGE-AID Screening   Patient Details  Name: Jessica [name] MRN: [id number] Date of Birth: May 20, 1951  [facility] CM/SW Contact:    Emeterio Reeve, [place] Phone Number: 05/15/2020, 1:48 PM   Clinical Narrative:  CSW met with pt at bedside. CSW introduced self and explained role at the hospital.  Pt denies alcohol use. Pt denies substance use. Pt did not need any resources at this time.    CAGE-AID Screening:    Have You Ever Felt You Ought to Cut Down on Your Drinking or Drug Use?: No Have People Annoyed You By Critizing Your Drinking Or Drug Use?: No Have You Felt Bad Or Guilty About Your Drinking Or Drug Use?: No Have You Ever Had a Drink or Used Drugs First Thing In The Morning to Steady Your Nerves or to Get Rid of a Hangover?: No CAGE-AID Score: 0  Substance Abuse Education Offered: Yes    Blima Ledger, [place] Social Worker [phone]

## 2020-05-15 NOTE — Evaluation (Signed)
Occupational Therapy Evaluation Patient Details Name: Jessica [name] MRN: [id number] DOB: [birth year] Today's Date: 05/15/2020    History of Present Illness 69 y.o. year-old female with a history of CAD and congenital blindness presenting to the ED with chief complaint of fall.. Found to have large left abdominal wall hematoma.   Clinical Impression   Pt admitted with the above diagnoses. At baseline, pt is independent to mod I with ADLs. Pt is currently min guard with LB ADLs and tub shower transfers. Anticipate she will progress quickly with functional transfers and mobility as medical status improves. Discussed strategies for safety at home, LB ADLs (for comfort 2/2 abdominal pain), and energy conservation strategies. No further acute OT needs indicated.      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 Required Braces or Orthoses:  (abdominal binder) Restrictions Weight Bearing Restrictions: No      Mobility Bed Mobility               General bed mobility comments: up in recliner    Transfers Overall transfer level: Needs assistance Equipment used: None Transfers: Sit to/from Stand Sit to Stand: Supervision         General transfer comment: supervision for safety    Balance Overall balance assessment: Needs assistance Sitting-balance support: No upper extremity supported;Feet supported Sitting balance-Leahy Scale: Good     Standing balance support: No upper extremity supported Standing balance-Leahy Scale: Fair                             ADL either performed or assessed with clinical judgement   ADL Overall ADL's : Needs assistance/impaired Eating/Feeding: Set up   Grooming: Set up   Upper Body Bathing: Set up   Lower Body Bathing:  Min guard   Upper Body Dressing : Set up   Lower Body Dressing: Min guard   Toilet Transfer: Supervision/safety   Toileting- Water quality scientist and Hygiene: Min guard   Tub/ Shower Transfer: Min guard   Functional mobility during ADLs: Supervision/safety;Min guard General ADL Comments: Educated on strategies for LB ADLs and bed mobility for comfort. Discussed energy conservation strategies     Vision Baseline Vision/History: Legally blind       Perception     Praxis      Pertinent Vitals/Pain Pain Assessment: 0-10 Pain Score: 7  Pain Location: abdomen Pain Descriptors / Indicators: Sore;Tightness Pain Intervention(s): Limited activity within patient's tolerance;Monitored during session;Repositioned     Hand Dominance Right   Extremity/Trunk Assessment Upper Extremity Assessment Upper Extremity Assessment: Overall WFL for tasks assessed   Lower Extremity Assessment Lower Extremity Assessment: Defer to PT evaluation   Cervical / Trunk Assessment Cervical / Trunk Assessment: Normal   Communication Communication Communication: No difficulties   Cognition Arousal/Alertness: Awake/alert Behavior During Therapy: WFL for tasks assessed/performed Overall Cognitive Status: Within Functional Limits for tasks assessed                                     General Comments       Exercises     Shoulder Instructions      Home Living Family/patient expects to be discharged to:: Private  residence Living Arrangements: Spouse/significant other Available Help at Discharge: Family Type of Home: House Home Access: Stairs to enter [employer] of Steps: 2   [place]: Two level;Able to live on main level with bedroom/bathroom     Bathroom Shower/Tub: Teacher, early years/pre: Standard     Home Equipment: Cane - single point;Shower seat          Prior Functioning/Environment Level of Independence: Independent         Comments: active at baseline        OT Problem List:        OT Treatment/Interventions:      OT Goals(Current goals can be found in the care plan section) Acute Rehab OT Goals Patient Stated Goal: home  OT Frequency:     Barriers to D/C:            Co-evaluation              AM-PAC OT "6 Clicks" Daily Activity     Outcome Measure Help from another person eating meals?: None Help from another person taking care of personal grooming?: None Help from another person toileting, which includes using toliet, bedpan, or urinal?: None Help from another person bathing (including washing, rinsing, drying)?: A Little Help from another person to put on and taking off regular upper body clothing?: None Help from another person to put on and taking off regular lower body clothing?: None 6 Click Score: 23   End of Session    Activity Tolerance: Patient tolerated treatment well Patient left: in chair;with call bell/phone within reach;with chair alarm set  OT Visit Diagnosis: Unsteadiness on feet (R26.81)                Time: 4580-9983 OT Time Calculation (min): 9 min Charges:  OT General Charges $OT Visit: 1 Visit OT Evaluation $OT Eval Low Complexity: 1 Low  Tyrone Schimke, OT Acute Rehabilitation Services Pager: [phone] Office: [phone]   Hortencia Pilar 05/15/2020, 1:34 PM

## 2020-05-15 NOTE — Progress Notes (Signed)
Inpatient Diabetes Program Recommendations  AACE/ADA: New Consensus Statement on Inpatient Glycemic Control (2015)  Target Ranges:  Prepandial:   less than 140 mg/dL      Peak postprandial:   less than 180 mg/dL (1-2 hours)      Critically ill patients:  140 - 180 mg/dL   Lab Results  Component Value Date   GLUCAP 150 (H) 05/15/2020   HGBA1C 7.4 (H) 05/14/2020    Review of Glycemic Control  Diabetes history: DM 2 Outpatient Diabetes medications: Glipizide 10 mg bid, Jardiance 10 mg Daily Current orders for Inpatient glycemic control:  Glipizide 10 mg bid Jardiance 10 mg Daily Novolog 0-15 units tid  A1c 7.4% on 10/20  Inpatient Diabetes Program Recommendations:    Consult to review medication regimen while inpatient.  Glucose 150 this am only on Novolog Correction. Risk of DKA with Jardiance and hypoglycemia with glipizide doses. Oral DM medication not advised while inpatient at this time.   Consider watching glucose trends on Novolog Correction for now.  Thanks,  Tama Headings RN, MSN, BC-ADM Inpatient Diabetes Coordinator Team Pager [phone] (8a-5p)

## 2020-05-16 ENCOUNTER — Other Ambulatory Visit (HOSPITAL_COMMUNITY): Payer: Self-pay | Admitting: General Surgery

## 2020-05-16 ENCOUNTER — Encounter: Payer: Self-pay | Admitting: Cardiovascular Disease

## 2020-05-16 LAB — CBC
HCT: 24.9 % — ABNORMAL LOW (ref 36.0–46.0)
Hemoglobin: 8.3 g/dL — ABNORMAL LOW (ref 12.0–15.0)
MCH: 31.3 pg (ref 26.0–34.0)
MCHC: 33.3 g/dL (ref 30.0–36.0)
MCV: 94 fL (ref 80.0–100.0)
Platelets: 188 10*3/uL (ref 150–400)
RBC: 2.65 MIL/uL — ABNORMAL LOW (ref 3.87–5.11)
RDW: 14.8 % (ref 11.5–15.5)
WBC: 9.9 10*3/uL (ref 4.0–10.5)
nRBC: 0 % (ref 0.0–0.2)

## 2020-05-16 LAB — BASIC METABOLIC PANEL
Anion gap: 9 (ref 5–15)
BUN: 23 mg/dL (ref 8–23)
CO2: 22 mmol/L (ref 22–32)
Calcium: 8.7 mg/dL — ABNORMAL LOW (ref 8.9–10.3)
Chloride: 106 mmol/L (ref 98–111)
Creatinine, Ser: 2.07 mg/dL — ABNORMAL HIGH (ref 0.44–1.00)
GFR, Estimated: 25 mL/min — ABNORMAL LOW (ref >60)
Glucose, Bld: 148 mg/dL — ABNORMAL HIGH (ref 70–99)
Potassium: 4.4 mmol/L (ref 3.5–5.1)
Sodium: 137 mmol/L (ref 135–145)

## 2020-05-16 LAB — BPAM RBC
Blood Product Expiration Date: 202110242359
Blood Product Expiration Date: 202111012359
ISSUE DATE / TIME: 202110200033
ISSUE DATE / TIME: 202110210851
Unit Type and Rh: 600
Unit Type and Rh: 6200

## 2020-05-16 LAB — TYPE AND SCREEN
ABO/RH(D): A POS
Antibody Screen: NEGATIVE
Unit division: 0
Unit division: 0

## 2020-05-16 LAB — GLUCOSE, CAPILLARY: Glucose-Capillary: 148 mg/dL — ABNORMAL HIGH (ref 70–99)

## 2020-05-16 MED ORDER — ACETAMINOPHEN 325 MG PO TABS: 650.0000 mg | ORAL_TABLET | ORAL | Status: DC | PRN

## 2020-05-16 MED ORDER — TRAMADOL HCL 50 MG PO TABS: 50.0000 mg | ORAL_TABLET | Freq: Four times a day (QID) | ORAL | 0 refills | Status: DC | PRN

## 2020-05-16 MED FILL — traMADol HCL 50 MG TABS: 50 | 4 days supply | Qty: 10 | Fill #0

## 2020-05-16 NOTE — Discharge Instructions (Signed)
Hematoma A hematoma is a collection of blood. A hematoma can happen:  Under the skin.  In an organ.  In a body space.  In a joint space.  In other tissues. The blood can thicken (clot) to form a lump that you can see and feel. The lump is often hard and may become sore and tender. The lump can be very small or very big. Most hematomas get better in a few days to weeks. However, some hematomas may be serious and need medical care. What are the causes? This condition is caused by:  An injury.  Blood that leaks under the skin.  Problems from surgeries.  Medical conditions that cause bleeding or bruising. What increases the risk? You are more likely to develop this condition if:  You are an older adult.  You use medicines that thin your blood. What are the signs or symptoms? Symptoms depend on where the hematoma is in your body.  If the hematoma is under the skin, there is: ? A firm lump on the body. ? Pain and tenderness in the area. ? Bruising. The skin above the lump may be blue, dark blue, purple-red, or yellowish.  If the hematoma is deep in the tissues or body spaces, there may be: ? Blood in the stomach. This may cause pain in the belly (abdomen), weakness, passing out (fainting), and shortness of breath. ? Blood in the head. This may cause a headache, weakness, trouble speaking or understanding speech, or passing out. How is this diagnosed? This condition is diagnosed based on:  Your medical history.  A physical exam.  Imaging tests, such as ultrasound or CT scan.  Blood tests. How is this treated? Treatment depends on the cause, size, and location of the hematoma. Treatment may include:  Doing nothing. Many hematomas go away on their own without treatment.  Surgery or close monitoring. This may be needed for large hematomas or hematomas that affect the body's organs.  Medicines. These may be given if a medical condition caused the hematoma. Follow  these instructions at home: Managing pain, stiffness, and swelling   If told, put ice on the area. ? Put ice in a plastic bag. ? Place a towel between your skin and the bag. ? Leave the ice on for 20 minutes, 2-3 times a day for the first two days.  If told, put heat on the affected area after putting ice on the area for two days. Use the heat source that your doctor tells you to use. This could be a moist heat pack or a heating pad. To do this: ? Place a towel between your skin and the heat source. ? Leave the heat on for 20-30 minutes. ? Remove the heat if your skin turns bright red. This is very important if you are unable to feel pain, heat, or cold. You may have a greater risk of getting burned.  Raise (elevate) the affected area above the level of your heart while you are sitting or lying down.  Wrap the affected area with an elastic bandage, if told by your doctor. Do not wrap the bandage too tightly.  If your hematoma is on a leg or foot and is painful, your doctor may give you crutches. Use them as told by your doctor. General instructions  Take over-the-counter and prescription medicines only as told by your doctor.  Keep all follow-up visits as told by your doctor. This is important. Contact a doctor if:  You have a  fever.  The swelling or bruising gets worse.  You start to get more hematomas. Get help right away if:  Your pain gets worse.  Your pain is not getting better with medicine.  Your skin over the hematoma breaks or starts to bleed.  Your hematoma is in your chest or belly and you: ? Pass out. ? Feel weak. ? Become short of breath.  You have a hematoma on your scalp that is caused by a fall or injury, and you: ? Have a headache that gets worse. ? Have trouble speaking or understanding speech. ? Become less alert or you pass out. Summary  A hematoma is a collection of blood in any part of your body.  Most hematomas get better on their own in a  few days to weeks. Some may need medical care.  Follow instructions from your doctor about how to care for your hematoma.  Contact a doctor if the swelling or bruising gets worse, or if you are short of breath. This information is not intended to replace advice given to you by your health care provider. Make sure you discuss any questions you have with your health care provider. Document Revised: 12/15/2017 Document Reviewed: 12/15/2017 Elsevier Patient Education  [employer]. Hematoma A hematoma is a collection of blood. A hematoma can happen:  Under the skin.  In an organ.  In a body space.  In a joint space.  In other tissues. The blood can thicken (clot) to form a lump that you can see and feel. The lump is often hard and may become sore and tender. The lump can be very small or very big. Most hematomas get better in a few days to weeks. However, some hematomas may be serious and need medical care. What are the causes? This condition is caused by:  An injury.  Blood that leaks under the skin.  Problems from surgeries.  Medical conditions that cause bleeding or bruising. What increases the risk? You are more likely to develop this condition if:  You are an older adult.  You use medicines that thin your blood. What are the signs or symptoms? Symptoms depend on where the hematoma is in your body.  If the hematoma is under the skin, there is: ? A firm lump on the body. ? Pain and tenderness in the area. ? Bruising. The skin above the lump may be blue, dark blue, purple-red, or yellowish.  If the hematoma is deep in the tissues or body spaces, there may be: ? Blood in the stomach. This may cause pain in the belly (abdomen), weakness, passing out (fainting), and shortness of breath. ? Blood in the head. This may cause a headache, weakness, trouble speaking or understanding speech, or passing out. How is this diagnosed? This condition is diagnosed based  on:  Your medical history.  A physical exam.  Imaging tests, such as ultrasound or CT scan.  Blood tests. How is this treated? Treatment depends on the cause, size, and location of the hematoma. Treatment may include:  Doing nothing. Many hematomas go away on their own without treatment.  Surgery or close monitoring. This may be needed for large hematomas or hematomas that affect the body's organs.  Medicines. These may be given if a medical condition caused the hematoma. Follow these instructions at home: Managing pain, stiffness, and swelling   If told, put ice on the area. ? Put ice in a plastic bag. ? Place a towel between your skin and the  bag. ? Leave the ice on for 20 minutes, 2-3 times a day for the first two days.  If told, put heat on the affected area after putting ice on the area for two days. Use the heat source that your doctor tells you to use. This could be a moist heat pack or a heating pad. To do this: ? Place a towel between your skin and the heat source. ? Leave the heat on for 20-30 minutes. ? Remove the heat if your skin turns bright red. This is very important if you are unable to feel pain, heat, or cold. You may have a greater risk of getting burned.  Raise (elevate) the affected area above the level of your heart while you are sitting or lying down.  Wrap the affected area with an elastic bandage, if told by your doctor. Do not wrap the bandage too tightly.  If your hematoma is on a leg or foot and is painful, your doctor may give you crutches. Use them as told by your doctor. General instructions  Take over-the-counter and prescription medicines only as told by your doctor.  Keep all follow-up visits as told by your doctor. This is important. Contact a doctor if:  You have a fever.  The swelling or bruising gets worse.  You start to get more hematomas. Get help right away if:  Your pain gets worse.  Your pain is not getting better with  medicine.  Your skin over the hematoma breaks or starts to bleed.  Your hematoma is in your chest or belly and you: ? Pass out. ? Feel weak. ? Become short of breath.  You have a hematoma on your scalp that is caused by a fall or injury, and you: ? Have a headache that gets worse. ? Have trouble speaking or understanding speech. ? Become less alert or you pass out. Summary  A hematoma is a collection of blood in any part of your body.  Most hematomas get better on their own in a few days to weeks. Some may need medical care.  Follow instructions from your doctor about how to care for your hematoma.  Contact a doctor if the swelling or bruising gets worse, or if you are short of breath. This information is not intended to replace advice given to you by your health care provider. Make sure you discuss any questions you have with your health care provider. Document Revised: 12/15/2017 Document Reviewed: 12/15/2017 Elsevier Patient Education  2020 [employer].

## 2020-05-16 NOTE — Discharge Summary (Signed)
Patient ID: Jessica [name] [id number] 24-Jun-1951 69 y.o.  Admit date: 05/13/2020 Discharge date: 05/16/2020  Admitting Diagnosis: Large left abdominal wall hematoma Acute blood loss anemia  Recent MI  DM  Discharge Diagnosis Patient Active Problem List   Diagnosis Date Noted  . Abdominal wall hematoma 05/13/2020  Fall Large left abdominal wall hematoma ABL anemia Recent MI DM Congenital blindness CKD Hypotension  Consultants Cardiology  Reason for Admission: 69 year old female had a myocardial infarction 4 months ago and has been on Brilinta.  She is visually impaired.  She walked into a countertop around 4 PM today, striking the area above her left hip.  Since then she said a lot of swelling in the area and she passed out at home.  She was evaluated in the emergency department and found to have a large abdominal wall hematoma with acute blood loss anemia.  I was asked to see her for admission.  She complains of localized pain.  Procedures none  Hospital Course:  The patient was admitted and given a unit of pRBCs initially due to anemia and hypotension.  She was given crystalloid as well.  Her BP ultimately responded to both of these things.  Her Brilinta was held and cardiology was consulted to make sure they were aware this was being held given her recent MI.  They actually felt as if this could be stopped and eventually she could be resumed on ASA and that would be likely all that was needed.  She will follow up with them in a couple of weeks for further evaluation.  She was started on clear liquids and able to be advanced once her nausea improved.  Her hgb did dip one other time and she received a 2nd unit of pRBCs with an appropriate response.  Her abdominal wall hematoma stabilized.  All of her other medical problems, CKD/DM, etc, remained stable.  She was stable for DC home on HD 3.  Physical Exam: Gen: NAD HEENT: ecchymosis of left cheek improved Neck:  trachea midline Heart: regular  Lungs: CTAB Abd: ecchymosis of LLQ with some tenderness in this area and some firmness noted but otherwise stable, ND, +BS, otherwise soft Ext: no edema, MAE, no other injuries noted Neuro: sensation normal throughout Psych: A&Ox3  Allergies as of 05/16/2020   No Known Allergies     Medication List    STOP taking these medications   Aspirin Low Dose 81 MG EC tablet Generic drug: aspirin   Brilinta 90 MG Tabs tablet Generic drug: ticagrelor     TAKE these medications   acetaminophen 325 MG tablet Commonly known as: TYLENOL Take 2 tablets (650 mg total) by mouth every 4 (four) hours as needed for mild pain.   carvedilol 12.5 MG tablet Commonly known as: COREG Take 12.5 mg by mouth 2 (two) times daily with a meal.   co-enzyme Q-10 30 MG capsule Take 30 mg by mouth daily.   famotidine 20 MG tablet Commonly known as: PEPCID Take 20 mg by mouth 2 (two) times daily.   fluticasone 50 MCG/ACT nasal spray Commonly known as: FLONASE Place 1 spray into both nostrils daily as needed for allergies.   glipiZIDE 10 MG tablet Commonly known as: GLUCOTROL Take 10 mg by mouth 2 (two) times daily.   isosorbide dinitrate 30 MG tablet Commonly known as: ISORDIL Take 15 mg by mouth daily.   Jardiance 10 MG Tabs tablet Generic drug: empagliflozin Take 10 mg by mouth daily.   levothyroxine  75 MCG tablet Commonly known as: SYNTHROID Take 75 mcg by mouth daily.   LORazepam 0.5 MG tablet Commonly known as: ATIVAN Take 0.5 mg by mouth at bedtime as needed for anxiety or sleep.   magnesium 30 MG tablet Take 30 mg by mouth daily.   metoprolol succinate 25 MG 24 hr tablet Commonly known as: TOPROL-XL Take 12.5 mg by mouth daily.   nitroGLYCERIN 0.4 MG SL tablet Commonly known as: NITROSTAT Place 0.4 mg under the tongue every 5 (five) minutes as needed for chest pain.   pantoprazole 40 MG tablet Commonly known as: PROTONIX Take 40 mg by mouth  daily.   rosuvastatin 40 MG tablet Commonly known as: CRESTOR Take 40 mg by mouth at bedtime.   sertraline 50 MG tablet Commonly known as: ZOLOFT Take 50 mg by mouth at bedtime.   traMADol 50 MG tablet Commonly known as: ULTRAM Take 1 tablet (50 mg total) by mouth every 6 (six) hours as needed for moderate pain.         Follow-up Information    Darreld Mclean, PA-C Follow up on 06/11/2020.   Specialties: Physician Assistant, Cardiology Why: at 2:15pm for your follow up appt  Contact information: [address] [place] [address] [phone]        Marton Redwood, MD Follow up.   Specialty: Internal Medicine Why: as needed Contact information: [place] [address] [phone]        [place] Follow up on 06/05/2020.   Why: 9:20am, arrive 30 minutes prior to appointment for paperwork and check in process. Contact information: [address] [address] [phone]              Signed: Saverio Danker, [facility] Surgery 05/16/2020, 10:05 AM Please see Amion for pager number during day hours 7:00am-4:30pm, 7-11:30am on Weekends

## 2020-05-16 NOTE — Progress Notes (Signed)
Discharge instructions given to patient, husband and son present. IV removed.  Patient belongings returned to family on 10/21. Pt has no questions at this time, transported to lobby in wheelchair. NAD.

## 2020-05-16 NOTE — Progress Notes (Signed)
Physical Therapy Treatment Patient Details Name: Jessica [name] MRN: [id number] DOB: March 26, 1951 Today's Date: 05/16/2020    History of Present Illness 69 y.o. year-old female with a history of CAD with MI and congenital blindness presenting to the ED with chief complaint of fall against countertop. Pt found to have large left abdominal wall hematoma.    PT Comments    Pt with improved standing balance and tolerance for gait today, ambulating 200+ ft with HHA mostly utilized for vision. Pt proficiently navigated steps with cuing needed given visual impairment. Per pt, her husband has visual impairments as well so she would prefer HHPT as opposed to OP, plan updated accordingly. Pt plans to [name]/c home today.     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 Required Braces or Orthoses: Other Brace (abdominal binder) Other Brace: abdominal binder Restrictions Weight Bearing Restrictions: No    Mobility  Bed Mobility               General bed mobility comments: up in recliner  Transfers Overall transfer level: Needs assistance Equipment used: None Transfers: Sit to/from Stand Sit to Stand: Supervision         General transfer comment: for safety, pt bracing abdomen with UEs  Ambulation/Gait Ambulation/Gait assistance: Min guard Gait Distance (Feet): 200 Feet Assistive device: 1 person hand held assist Gait Pattern/deviations: Step-through pattern;Decreased stride length;Trunk flexed Gait velocity: decr   General Gait Details: Min guard for safety, pt utlizing HHA from PT as is baseline for vision. No evidence of overt imbalance today, much improved from yesterday. Cues for hallway navigation   Stairs Stairs:  Yes Stairs assistance: Min guard Stair Management: One rail Right;Step to pattern;Forwards Number of Stairs: 3 General stair comments: Min guard for safety, verbal cuing for step-to sequencing on steps.   Wheelchair Mobility    Modified Rankin (Stroke Patients Only)       Balance Overall balance assessment: Needs assistance;History of Falls ("stumbles") Sitting-balance support: No upper extremity supported;Feet supported Sitting balance-Leahy Scale: Good     Standing balance support: No upper extremity supported Standing balance-Leahy Scale: Fair                              Cognition Arousal/Alertness: Awake/alert Behavior During Therapy: WFL for tasks assessed/performed Overall Cognitive Status: Within Functional Limits for tasks assessed                                        Exercises      General Comments        Pertinent Vitals/Pain Pain Assessment: 0-10 Pain Score: 8  Pain Location: abdomen Pain Descriptors / Indicators: Sore;Tightness Pain Intervention(s): Limited activity within patient's tolerance;Monitored during session;Repositioned    Home Living                      Prior Function            PT Goals (current goals can now be found in the care plan section) Acute Rehab PT Goals Patient Stated Goal: home PT Goal Formulation: With patient Time For Goal Achievement: 05/29/20  Potential to Achieve Goals: Good Progress towards PT goals: Progressing toward goals    Frequency    Min 3X/week      PT Plan Discharge plan needs to be updated    Co-evaluation              AM-PAC PT "6 Clicks" Mobility   Outcome Measure  Help needed turning from your back to your side while in a flat bed without using bedrails?: A Little Help needed moving from lying on your back to sitting on the side of a flat bed without using bedrails?: A Little Help needed moving to and from a bed to a chair (including a  wheelchair)?: A Little Help needed standing up from a chair using your arms ([name].g., wheelchair or bedside chair)?: A Little Help needed to walk in hospital room?: A Little Help needed climbing 3-5 steps with a railing? : A Little 6 Click Score: 18    End of Session Equipment Utilized During Treatment: Other (comment) (abd binder) Activity Tolerance: Patient tolerated treatment well Patient left: in chair;with call bell/phone within reach Nurse Communication: Mobility status PT Visit Diagnosis: Unsteadiness on feet (R26.81);Pain Pain - Right/Left: Left Pain - part of body:  (abdomen)     Time: 7048-8891 PT Time Calculation (min) (ACUTE ONLY): 11 min  Charges:  $Gait Training: 8-22 mins                     Jessica [name], PT Acute Rehabilitation Services Pager [phone]  Office [phone]     Jessica [name] [name] 05/16/2020, 11:32 AM

## 2020-05-16 NOTE — TOC Transition Note (Signed)
Transition of Care (TOC) - CM/SW Discharge Note Marvetta Gibbons RN,BSN Transitions of Care Unit 4NP (non trauma) - RN Case Manager See Treatment Team for direct Phone #   Patient Details  Name: Jessica [name] MRN: [id number] Date of Birth: January 29, 1951  Transition of Care [facility]) CM/SW Contact:  Dawayne Patricia, RN Phone Number: 05/16/2020, 12:09 PM   Clinical Narrative:    Pt stable for transition home. Lives with spouse- both are visually impaired. Recommendation for outpt PT- verbal order given by K. Maxwell Caul- spoke with pt and spouse at bedside regarding outpt PT- per pt she would prefer to do [facility] therapy instead- have requested [place] orders from [facility]- pt given choice for [facility] agency- per pt and spouse- they do not have a preference and defer to CM to secure agency. Pt states she has no DME needs.  Has transportation home.   Call made to [facility] with Alvis Lemmings for [facility] PT/OT referral- referral has been accepted.    Final next level of care: [place] Barriers to Discharge: Barriers Resolved   Patient Goals and CMS Choice Patient states their goals for this hospitalization and ongoing recovery are:: return home CMS Medicare.gov Compare Post Acute Care list provided to:: Patient Choice offered to / list presented to : Patient, Spouse  Discharge Placement                Home with [facility]      Discharge Plan and Services   Discharge Planning Services: CM Consult Post Acute Care Choice: Home Health          DME Arranged: N/A DME Agency: NA       HH Arranged: PT, OT [place] Agency: [place] Date [place]: 05/16/20 Time HH Agency Contacted: 58 Representative spoke with at [place]: Tommi Rumps  Social Determinants of Health ([place]) Interventions     Readmission Risk Interventions Readmission Risk Prevention Plan 05/16/2020  Post Dischage Appt Complete  Medication Screening Complete  Transportation Screening Complete

## 2020-05-21 DIAGNOSIS — S301XXA Contusion of abdominal wall, initial encounter: Secondary | ICD-10-CM | POA: Diagnosis not present

## 2020-05-21 DIAGNOSIS — M5030 Other cervical disc degeneration, unspecified cervical region: Secondary | ICD-10-CM | POA: Diagnosis not present

## 2020-05-21 DIAGNOSIS — S301XXD Contusion of abdominal wall, subsequent encounter: Secondary | ICD-10-CM | POA: Diagnosis not present

## 2020-05-21 DIAGNOSIS — I1310 Hypertensive heart and chronic kidney disease without heart failure, with stage 1 through stage 4 chronic kidney disease, or unspecified chronic kidney disease: Secondary | ICD-10-CM | POA: Diagnosis not present

## 2020-05-21 DIAGNOSIS — D631 Anemia in chronic kidney disease: Secondary | ICD-10-CM | POA: Diagnosis not present

## 2020-05-21 DIAGNOSIS — N189 Chronic kidney disease, unspecified: Secondary | ICD-10-CM | POA: Diagnosis not present

## 2020-05-21 DIAGNOSIS — M5020 Other cervical disc displacement, unspecified cervical region: Secondary | ICD-10-CM | POA: Diagnosis not present

## 2020-05-21 DIAGNOSIS — I2510 Atherosclerotic heart disease of native coronary artery without angina pectoris: Secondary | ICD-10-CM | POA: Diagnosis not present

## 2020-05-21 DIAGNOSIS — I252 Old myocardial infarction: Secondary | ICD-10-CM | POA: Diagnosis not present

## 2020-05-21 DIAGNOSIS — E1122 Type 2 diabetes mellitus with diabetic chronic kidney disease: Secondary | ICD-10-CM | POA: Diagnosis not present

## 2020-05-22 ENCOUNTER — Inpatient Hospital Stay: Admission: RE | Admit: 2020-05-22 | Payer: Medicare HMO | Source: Ambulatory Visit

## 2020-05-22 DIAGNOSIS — D62 Acute posthemorrhagic anemia: Secondary | ICD-10-CM | POA: Diagnosis not present

## 2020-05-22 DIAGNOSIS — R519 Headache, unspecified: Secondary | ICD-10-CM | POA: Diagnosis not present

## 2020-05-22 DIAGNOSIS — I2510 Atherosclerotic heart disease of native coronary artery without angina pectoris: Secondary | ICD-10-CM | POA: Diagnosis not present

## 2020-05-22 DIAGNOSIS — N184 Chronic kidney disease, stage 4 (severe): Secondary | ICD-10-CM | POA: Diagnosis not present

## 2020-05-22 DIAGNOSIS — I10 Essential (primary) hypertension: Secondary | ICD-10-CM | POA: Diagnosis not present

## 2020-05-22 DIAGNOSIS — E538 Deficiency of other specified B group vitamins: Secondary | ICD-10-CM | POA: Diagnosis not present

## 2020-05-22 DIAGNOSIS — R110 Nausea: Secondary | ICD-10-CM | POA: Diagnosis not present

## 2020-05-22 DIAGNOSIS — S301XXA Contusion of abdominal wall, initial encounter: Secondary | ICD-10-CM | POA: Diagnosis not present

## 2020-05-22 DIAGNOSIS — Z9861 Coronary angioplasty status: Secondary | ICD-10-CM | POA: Diagnosis not present

## 2020-05-23 DIAGNOSIS — I1310 Hypertensive heart and chronic kidney disease without heart failure, with stage 1 through stage 4 chronic kidney disease, or unspecified chronic kidney disease: Secondary | ICD-10-CM | POA: Diagnosis not present

## 2020-05-23 DIAGNOSIS — S301XXD Contusion of abdominal wall, subsequent encounter: Secondary | ICD-10-CM | POA: Diagnosis not present

## 2020-05-23 DIAGNOSIS — M5020 Other cervical disc displacement, unspecified cervical region: Secondary | ICD-10-CM | POA: Diagnosis not present

## 2020-05-23 DIAGNOSIS — I252 Old myocardial infarction: Secondary | ICD-10-CM | POA: Diagnosis not present

## 2020-05-23 DIAGNOSIS — M5030 Other cervical disc degeneration, unspecified cervical region: Secondary | ICD-10-CM | POA: Diagnosis not present

## 2020-05-23 DIAGNOSIS — D631 Anemia in chronic kidney disease: Secondary | ICD-10-CM | POA: Diagnosis not present

## 2020-05-23 DIAGNOSIS — N189 Chronic kidney disease, unspecified: Secondary | ICD-10-CM | POA: Diagnosis not present

## 2020-05-23 DIAGNOSIS — I2510 Atherosclerotic heart disease of native coronary artery without angina pectoris: Secondary | ICD-10-CM | POA: Diagnosis not present

## 2020-05-23 DIAGNOSIS — E1122 Type 2 diabetes mellitus with diabetic chronic kidney disease: Secondary | ICD-10-CM | POA: Diagnosis not present

## 2020-05-27 DIAGNOSIS — M5030 Other cervical disc degeneration, unspecified cervical region: Secondary | ICD-10-CM | POA: Diagnosis not present

## 2020-05-27 DIAGNOSIS — I1310 Hypertensive heart and chronic kidney disease without heart failure, with stage 1 through stage 4 chronic kidney disease, or unspecified chronic kidney disease: Secondary | ICD-10-CM | POA: Diagnosis not present

## 2020-05-27 DIAGNOSIS — S301XXD Contusion of abdominal wall, subsequent encounter: Secondary | ICD-10-CM | POA: Diagnosis not present

## 2020-05-27 DIAGNOSIS — N189 Chronic kidney disease, unspecified: Secondary | ICD-10-CM | POA: Diagnosis not present

## 2020-05-27 DIAGNOSIS — I252 Old myocardial infarction: Secondary | ICD-10-CM | POA: Diagnosis not present

## 2020-05-27 DIAGNOSIS — M5020 Other cervical disc displacement, unspecified cervical region: Secondary | ICD-10-CM | POA: Diagnosis not present

## 2020-05-27 DIAGNOSIS — E1122 Type 2 diabetes mellitus with diabetic chronic kidney disease: Secondary | ICD-10-CM | POA: Diagnosis not present

## 2020-05-27 DIAGNOSIS — D631 Anemia in chronic kidney disease: Secondary | ICD-10-CM | POA: Diagnosis not present

## 2020-05-27 DIAGNOSIS — I2510 Atherosclerotic heart disease of native coronary artery without angina pectoris: Secondary | ICD-10-CM | POA: Diagnosis not present

## 2020-05-30 DIAGNOSIS — I252 Old myocardial infarction: Secondary | ICD-10-CM | POA: Diagnosis not present

## 2020-05-30 DIAGNOSIS — M5020 Other cervical disc displacement, unspecified cervical region: Secondary | ICD-10-CM | POA: Diagnosis not present

## 2020-05-30 DIAGNOSIS — S301XXD Contusion of abdominal wall, subsequent encounter: Secondary | ICD-10-CM | POA: Diagnosis not present

## 2020-05-30 DIAGNOSIS — M5030 Other cervical disc degeneration, unspecified cervical region: Secondary | ICD-10-CM | POA: Diagnosis not present

## 2020-05-30 DIAGNOSIS — E1122 Type 2 diabetes mellitus with diabetic chronic kidney disease: Secondary | ICD-10-CM | POA: Diagnosis not present

## 2020-05-30 DIAGNOSIS — D631 Anemia in chronic kidney disease: Secondary | ICD-10-CM | POA: Diagnosis not present

## 2020-05-30 DIAGNOSIS — I1310 Hypertensive heart and chronic kidney disease without heart failure, with stage 1 through stage 4 chronic kidney disease, or unspecified chronic kidney disease: Secondary | ICD-10-CM | POA: Diagnosis not present

## 2020-05-30 DIAGNOSIS — I2510 Atherosclerotic heart disease of native coronary artery without angina pectoris: Secondary | ICD-10-CM | POA: Diagnosis not present

## 2020-05-30 DIAGNOSIS — N189 Chronic kidney disease, unspecified: Secondary | ICD-10-CM | POA: Diagnosis not present

## 2020-05-30 IMAGING — MG DIGITAL SCREENING BILATERAL MAMMOGRAM WITH TOMO AND CAD
8 series · 9 of 24 positions shown · non-contrast
Comparison: Previous exam(s).

ACR Breast Density Category a: The breast tissue is almost entirely
fatty.

CLINICAL DATA: Screening.

EXAM:
DIGITAL SCREENING BILATERAL MAMMOGRAM WITH TOMO AND CAD

[R MLO synth-2D]
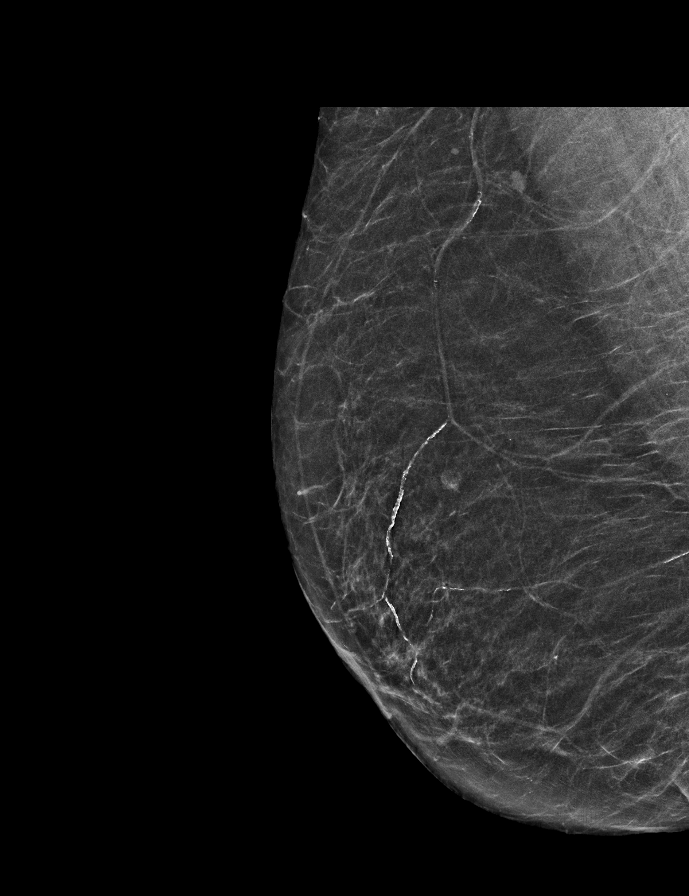

[R CC synth-2D]
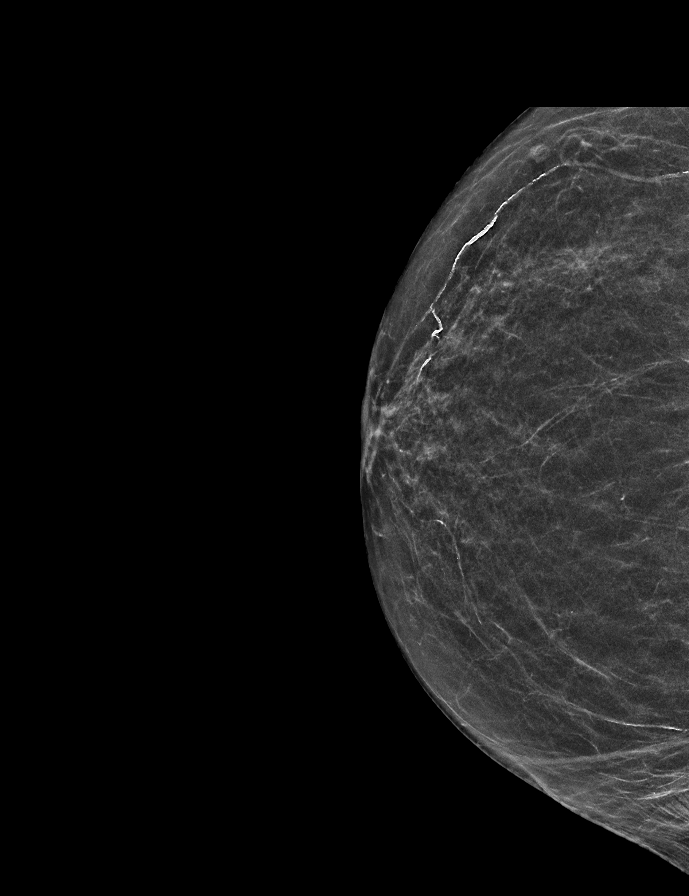

[L MLO synth-2D]
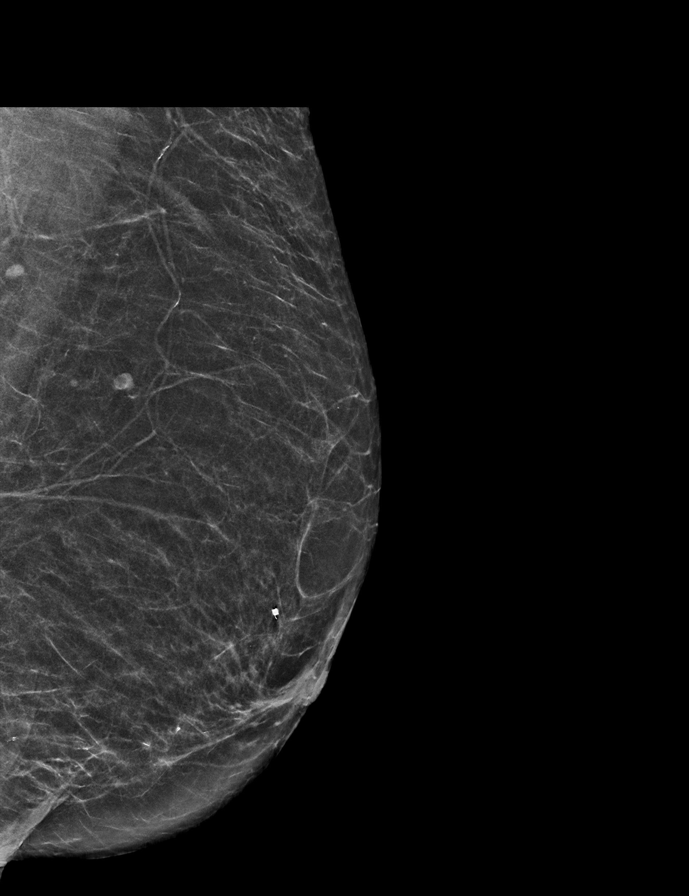

[L CC synth-2D]
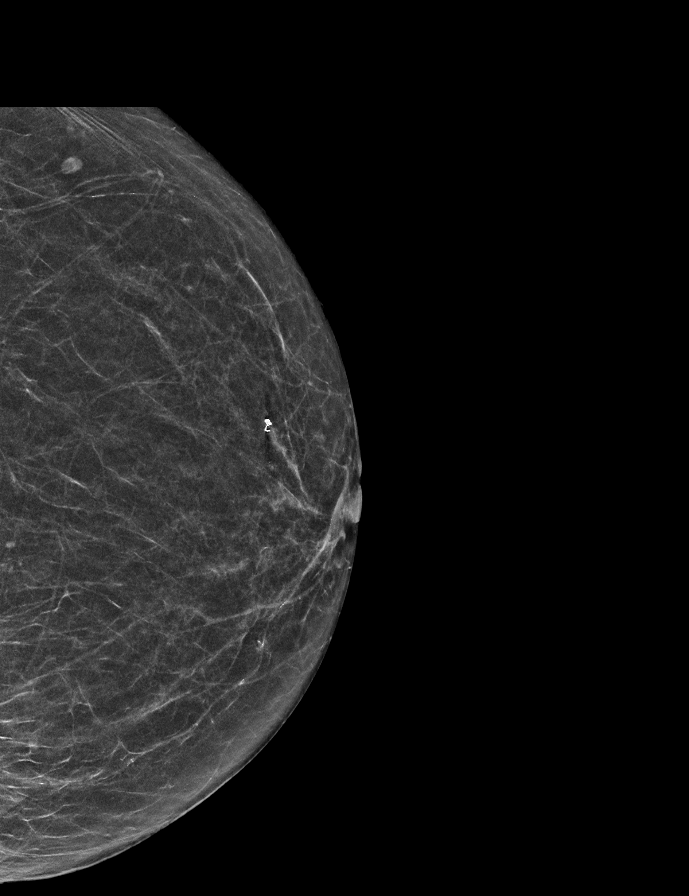

[R CC tomo · 2 of 44 frames shown]
[frame 15/44]
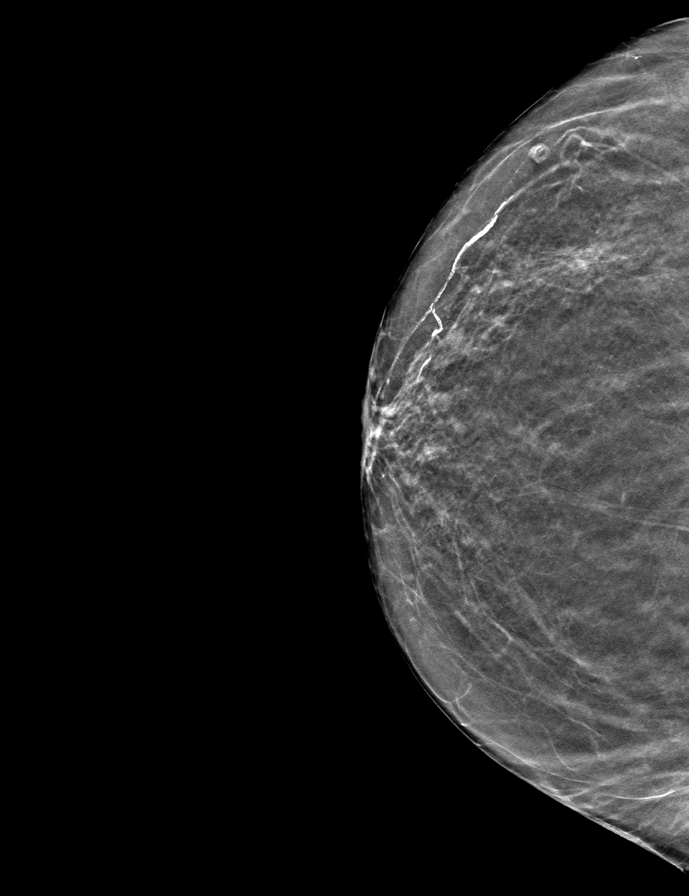
[frame 23/44]
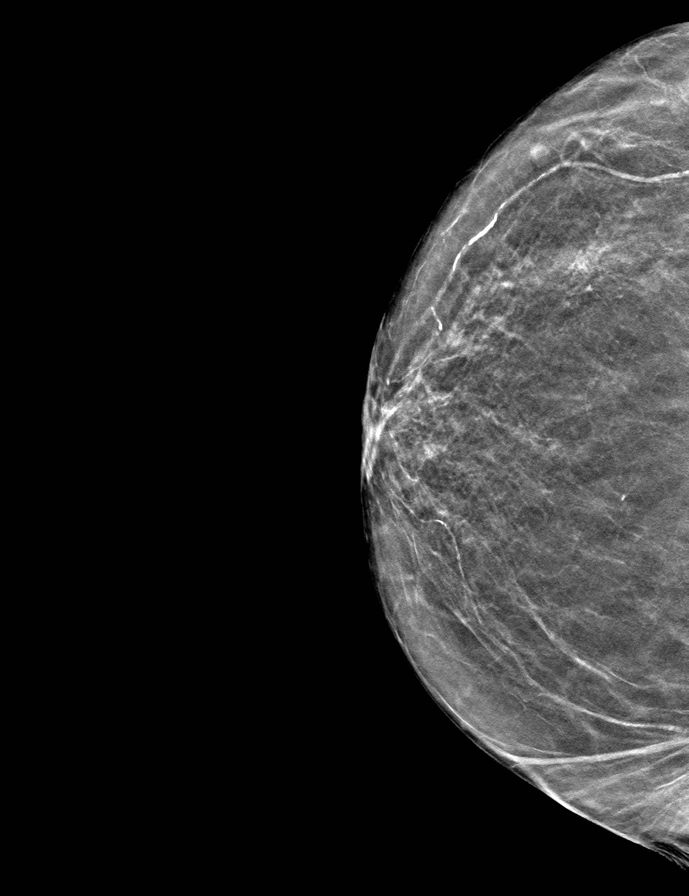

[L CC tomo · tomo slice 23/44.0]
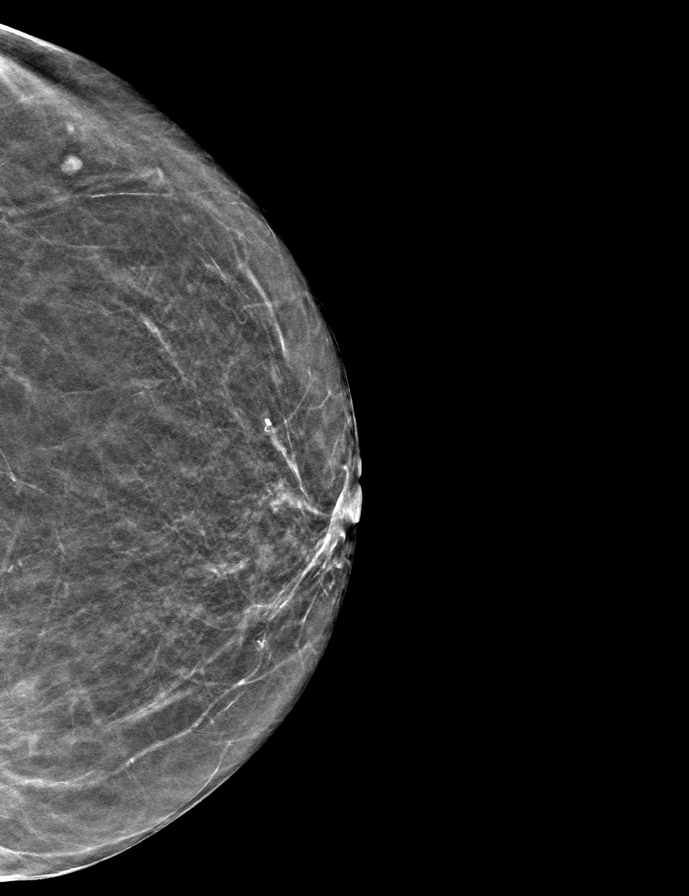

[L MLO tomo · tomo slice 27/52.0]
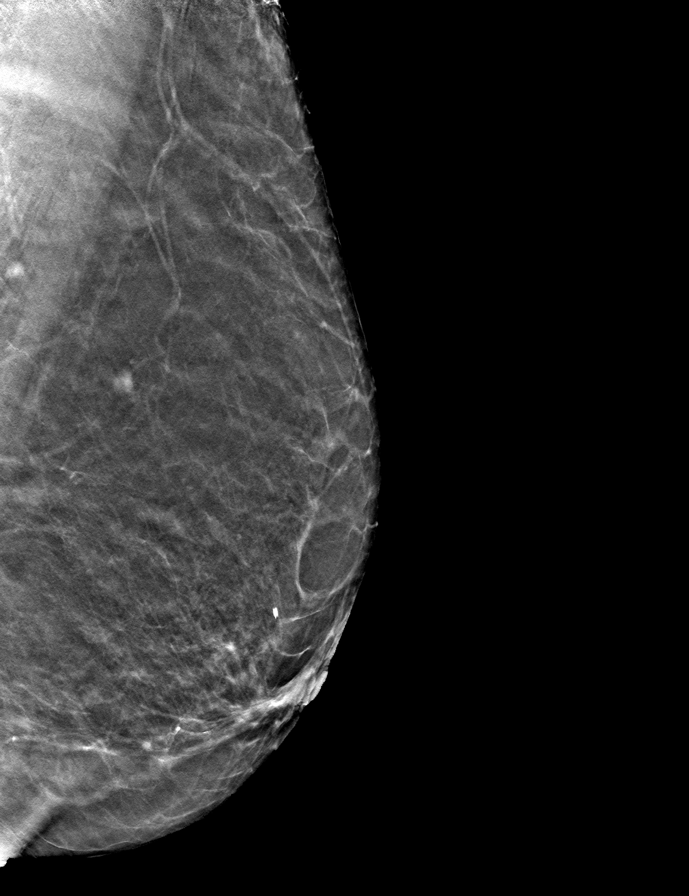

[R MLO tomo · tomo slice 25/48.0]
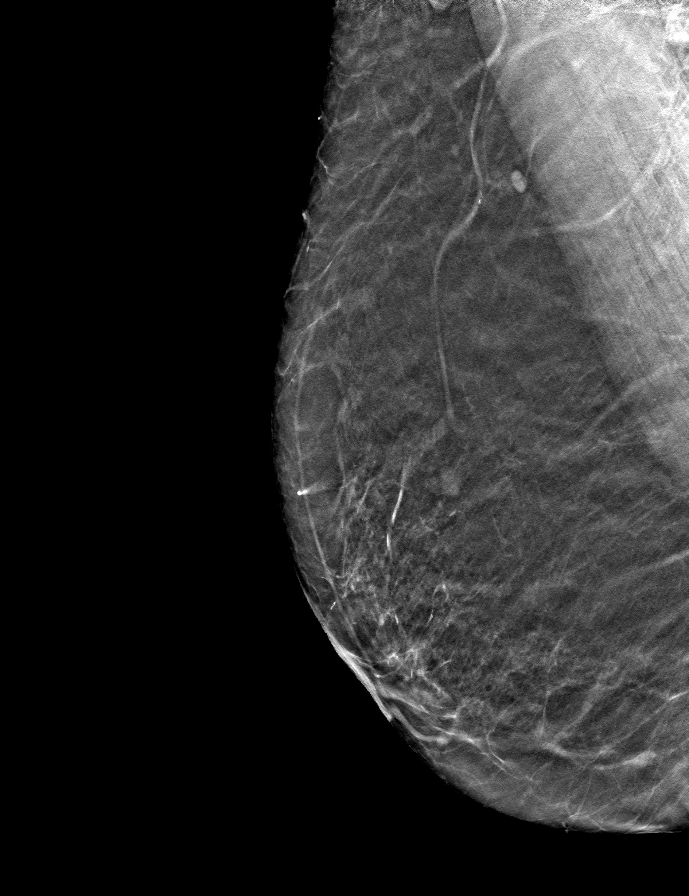

[9 of 24 positions shown; findings below may reference images not displayed]

FINDINGS: There are no findings suspicious for malignancy. Images were
processed with CAD.
IMPRESSION: No mammographic evidence of malignancy. A result letter of this
screening mammogram will be mailed directly to the patient.

RECOMMENDATION:
Screening mammogram in one year. (Code:[id number])

BI-RADS CATEGORY  1: Negative.

## 2020-06-02 DIAGNOSIS — I1310 Hypertensive heart and chronic kidney disease without heart failure, with stage 1 through stage 4 chronic kidney disease, or unspecified chronic kidney disease: Secondary | ICD-10-CM | POA: Diagnosis not present

## 2020-06-02 DIAGNOSIS — I252 Old myocardial infarction: Secondary | ICD-10-CM | POA: Diagnosis not present

## 2020-06-02 DIAGNOSIS — D631 Anemia in chronic kidney disease: Secondary | ICD-10-CM | POA: Diagnosis not present

## 2020-06-02 DIAGNOSIS — M5030 Other cervical disc degeneration, unspecified cervical region: Secondary | ICD-10-CM | POA: Diagnosis not present

## 2020-06-02 DIAGNOSIS — M5020 Other cervical disc displacement, unspecified cervical region: Secondary | ICD-10-CM | POA: Diagnosis not present

## 2020-06-02 DIAGNOSIS — I2510 Atherosclerotic heart disease of native coronary artery without angina pectoris: Secondary | ICD-10-CM | POA: Diagnosis not present

## 2020-06-02 DIAGNOSIS — S301XXD Contusion of abdominal wall, subsequent encounter: Secondary | ICD-10-CM | POA: Diagnosis not present

## 2020-06-02 DIAGNOSIS — N189 Chronic kidney disease, unspecified: Secondary | ICD-10-CM | POA: Diagnosis not present

## 2020-06-02 DIAGNOSIS — E1122 Type 2 diabetes mellitus with diabetic chronic kidney disease: Secondary | ICD-10-CM | POA: Diagnosis not present

## 2020-06-04 DIAGNOSIS — E1122 Type 2 diabetes mellitus with diabetic chronic kidney disease: Secondary | ICD-10-CM | POA: Diagnosis not present

## 2020-06-04 DIAGNOSIS — M5030 Other cervical disc degeneration, unspecified cervical region: Secondary | ICD-10-CM | POA: Diagnosis not present

## 2020-06-04 DIAGNOSIS — S301XXD Contusion of abdominal wall, subsequent encounter: Secondary | ICD-10-CM | POA: Diagnosis not present

## 2020-06-04 DIAGNOSIS — D631 Anemia in chronic kidney disease: Secondary | ICD-10-CM | POA: Diagnosis not present

## 2020-06-04 DIAGNOSIS — I252 Old myocardial infarction: Secondary | ICD-10-CM | POA: Diagnosis not present

## 2020-06-04 DIAGNOSIS — I1310 Hypertensive heart and chronic kidney disease without heart failure, with stage 1 through stage 4 chronic kidney disease, or unspecified chronic kidney disease: Secondary | ICD-10-CM | POA: Diagnosis not present

## 2020-06-04 DIAGNOSIS — M5020 Other cervical disc displacement, unspecified cervical region: Secondary | ICD-10-CM | POA: Diagnosis not present

## 2020-06-04 DIAGNOSIS — I2510 Atherosclerotic heart disease of native coronary artery without angina pectoris: Secondary | ICD-10-CM | POA: Diagnosis not present

## 2020-06-04 DIAGNOSIS — N189 Chronic kidney disease, unspecified: Secondary | ICD-10-CM | POA: Diagnosis not present

## 2020-06-05 DIAGNOSIS — S301XXD Contusion of abdominal wall, subsequent encounter: Secondary | ICD-10-CM | POA: Diagnosis not present

## 2020-06-06 NOTE — Progress Notes (Deleted)
Cardiology Office Note:    Date:  06/06/2020   ID:  Jessica [name], DOB 1951-06-21, MRN [id number]  PCP:  Marton Redwood, MD  Cardiologist:  Shelva Majestic, MD  Electrophysiologist:  None   Referring MD: Marton Redwood, MD   Chief Complaint: follow-up of CAD  History of Present Illness:    Jessica [name] is a 69 y.o. female with a history of CAD with STEMI in 12/2019 s/p balloon angioplasty to 100% RPDA lesion, paroxysmal SVT, hypertension, hyperlipidemia, type 2 diabetes mellitus, CKD stage III, hypothyroidism, and GERD who is followed by Dr. Claiborne Billings and presents today for follow-up.  Patient was admitted in 12/2019 with complaints of shortness of breath and nausea. EKG shows subtle ST elevation in inferior leads and V5-V6. Cardiac cath showed 100% stenosis of RPDA and 30% stenosis of proximal LAD. RDPA was a small vessel so balloon angioplasty was performed with 5% residual stenosis. Echo showed LVEF of 55-60% with moderate akinesis of inferior wallk, apical segment, and inferior septal walls. Patient was readmitted again later that month with chest/epigastric pain with radiation to her back with mild relief with sublingual Nitro. Plan was for continued medical management with uptitration of her anti-anginals. Amlodipine and Imdur were started.   Patient was last seen by Dr. Claiborne Billings on 05/13/2020 at which time she was doing well from a cardiac standpoint. Coreg was switched to Toprol-XL because patient stated she was only taking her Coreg once daily. Patient was advised to follow-up in 6 months.   Unfortunately, later that same day patient presented back to the ED after a syncopal episode. She was walking quickly through her kitchen when she accidentally struck her abdomen against the corner of the countertop. This area began to swell and she felt malaise, fatigue, lightheadedness, and had a syncopal episode. She then developed persistent nausea and vomiting. Abdominal/pelvic CT showed a large abdominal  wall hematoma. Her hemoglobin was 7.9. She ultimately required 2 units of PRBCs during admission. Aspirin and Brilinta were held on admission and Cardiology was consulted for recommendations on antiplatelet therapy. Dr. Tamala Julian reviewed initial cath films and determined only very small territory would be threatened if RPDA reoccludes. Aspirin monotherapy was recommended when felt safe from a bleeding standpoint.  CAD - History of STEMI in 12/2019 s/p balloon angioplasty to RPDA.  - No angina.  - Initially on DAPT with Aspirin and Brilinta. However, recently stopped after she developed abdominal wall hematoma (felt to be OK given only small territory would be threatened if RPDA reoccluded). Restart Aspirin ***. - Continue Toprol-XL 12.5mg  daily, Imdur 15mg  daily, and Crestor 40mg  daily.  Hypertension - ***  Hyperlipidemia - Lipid panel from 12/2019: Total Cholesterol 116, Triglycerides 216, HDL 26, LDL 47.  - LDL goal <70 given CAD. - Continue Crestor 40mg  daily.   Type 2 Diabetes - Hemoglobin A1c 7.4 in 04/2020.  - On Jardiance at home. - Managed by PCP.  CKD Stage IV - Baseline creatinine around 1.9 to 2.1.  Past Medical History:  Diagnosis Date  . Anemia   . Anxiety   . CKD (chronic kidney disease)    CKD III per PCP notes-pt denies  . Congenital blindness   . Diabetes mellitus   . Diverticula of small intestine   . Esophageal stricture   . GERD (gastroesophageal reflux disease)   . H/O hiatal hernia   . Hyperlipidemia   . Hypertension   . Hypothyroidism   . Iron deficiency anemia   . Low magnesium level   .  Low sodium levels   . Myocardial infarct ([place])   . Osteopenia   . Osteoporosis   . Postoperative nausea and vomiting   . Renal disease   . Retinitis   . STEMI (ST elevation myocardial infarction) ([place])   . SVT (supraventricular tachycardia) ([place])   . Vitamin B 12 deficiency     Past Surgical History:  Procedure Laterality Date  . BACK SURGERY    . CARPAL  TUNNEL RELEASE     Right   . CESAREAN SECTION    . CHOLECYSTECTOMY  1985  . COLONOSCOPY    . CORONARY BALLOON ANGIOPLASTY N/A 12/30/2019   Procedure: CORONARY BALLOON ANGIOPLASTY;  Surgeon: Troy Sine, MD;  Location: [facility];  Service: Cardiovascular;  Laterality: N/A;  . CORONARY/GRAFT ACUTE MI REVASCULARIZATION N/A 12/30/2019   Procedure: Coronary/Graft Acute MI Revascularization;  Surgeon: Troy Sine, MD;  Location: [facility];  Service: Cardiovascular;  Laterality: N/A;  . HERNIA REPAIR    . LEFT HEART CATH AND CORONARY ANGIOGRAPHY N/A 12/30/2019   Procedure: LEFT HEART CATH AND CORONARY ANGIOGRAPHY;  Surgeon: Troy Sine, MD;  Location: [facility];  Service: Cardiovascular;  Laterality: N/A;  . Right knee arthroscopy  1993  . Right thyroidectomy  2007  . TOTAL ABDOMINAL HYSTERECTOMY W/ BILATERAL SALPINGOOPHORECTOMY  1993  . UPPER GASTROINTESTINAL ENDOSCOPY      Current Medications: No outpatient medications have been marked as taking for the 06/11/20 encounter (Appointment) with Darreld Mclean, PA-C.     Allergies:   Codeine, Erythromycin, and Levaquin [levofloxacin]   Social History   Socioeconomic History  . Marital status: Married    Spouse name: Not on file  . Number of children: 4  . Years of education: Not on file  . Highest education level: Not on file  Occupational History  . Occupation: house wife   . Occupation: retired    Fish farm manager: [employer]  Tobacco Use  . Smoking status: Never Smoker  . Smokeless tobacco: Never Used  Vaping Use  . Vaping Use: Never used  Substance and Sexual Activity  . Alcohol use: No  . Drug use: No  . Sexual activity: Not Currently  Other Topics Concern  . Not on file  Social History Narrative   ** Merged History Encounter **       Social Determinants of Health   Financial Resource Strain:   . Difficulty of Paying Living Expenses: Not on file  Food Insecurity:   .  Worried About Charity fundraiser in the Last Year: Not on file  . Ran Out of Food in the Last Year: Not on file  Transportation Needs:   . Lack of Transportation (Medical): Not on file  . Lack of Transportation (Non-Medical): Not on file  Physical Activity:   . Days of Exercise per Week: Not on file  . Minutes of Exercise per Session: Not on file  Stress:   . Feeling of Stress : Not on file  Social Connections:   . Frequency of Communication with Friends and Family: Not on file  . Frequency of Social Gatherings with Friends and Family: Not on file  . Attends Religious Services: Not on file  . Active Member of Clubs or Organizations: Not on file  . Attends Archivist Meetings: Not on file  . Marital Status: Not on file     Family History: The patient's ***family history includes COPD in her father; CVA  in her mother; Coronary artery disease in her father; Diabetes Mellitus II in her maternal grandmother and mother; Heart disease in her mother; Hyperlipidemia in her sister; Hypertension in her father, mother, sister, and sister; Uterine cancer in her sister. There is no history of Colon cancer, Rectal cancer, Stomach cancer, or Esophageal cancer.  ROS:   Please see the history of present illness.    *** All other systems reviewed and are negative.  EKGs/Labs/Other Studies Reviewed:    The following studies were reviewed today:  Cardiac Catheterization 12/30/2019:  Prox LAD lesion is 30% stenosed.  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  Post intervention, there is a 5% residual stenosis.   Mild nonobstructive stenosis of 30% in the proximal LAD in the region of the first diagonal and septal perforating artery.  Normal ramus intermediate and left circumflex coronary arteries.  Dominant RCA with total mid distal occlusion of the PDA vessel.  LVEDP 22 mmHg.  Recommendation: DAPT initially but since no stenting was performed duration may be less than 1 year.  Will DC  bivalirudin several hours post procedure.  Hydrate.  Aggressive lipid-lowering therapy with target LDL less than 70 and optimal blood pressure control. _______________  Echocardiogram 12/31/2019: Impressions: 1. Left ventricular ejection fraction, by estimation, is 50 to 55%. The  left ventricle has low normal function. The left ventricle demonstrates  regional wall motion abnormalities (see scoring diagram/findings for  description). There is mild left  ventricular hypertrophy. Left ventricular diastolic parameters are  consistent with Grade I diastolic dysfunction (impaired relaxation). There  is moderate hypokinesis of the left ventricular, entire inferior wall,  apical segment and inferoseptal wall.  2. Right ventricular systolic function is normal. The right ventricular  size is normal.  3. The mitral valve is grossly normal. No evidence of mitral valve  regurgitation.  4. The aortic valve is tricuspid. Aortic valve regurgitation is not  visualized.  5. The inferior vena cava is normal in size with greater than 50%  respiratory variability, suggesting right atrial pressure of 3 mmHg.   EKG:  EKG *** ordered today. EKG personally reviewed and demonstrates ***.  Recent Labs: 12/30/2019: TSH 0.203 05/13/2020: ALT 54 05/16/2020: BUN 23; Creatinine, Ser 2.07; Hemoglobin 8.3; Platelets 188; Potassium 4.4; Sodium 137  Recent Lipid Panel    Component Value Date/Time   CHOL 116 12/31/2019 0325   TRIG 216 (H) 12/31/2019 0325   HDL 26 (L) 12/31/2019 0325   CHOLHDL 4.5 12/31/2019 0325   VLDL 43 (H) 12/31/2019 0325   LDLCALC 47 12/31/2019 0325    Physical Exam:    Vital Signs: There were no vitals taken for this visit.    Wt Readings from Last 3 Encounters:  05/13/20 124 lb (56.2 kg)  05/13/20 125 lb (56.7 kg)  02/06/20 130 lb (59 kg)     General: 69 y.o. female in no acute distress. HEENT: Normocephalic and atraumatic. Sclera clear. EOMs intact. Neck: Supple. No carotid  bruits. No JVD. Heart: *** RRR. Distinct S1 and S2. No murmurs, gallops, or rubs. Radial and distal pedal pulses 2+ and equal bilaterally. Lungs: No increased work of breathing. Clear to ausculation bilaterally. No wheezes, rhonchi, or rales.  Abdomen: Soft, non-distended, and non-tender to palpation. Bowel sounds present in all 4 quadrants.  MSK: Normal strength and tone for age. *** Extremities: No lower extremity edema.    Skin: Warm and dry. Neuro: Alert and oriented x3. No focal deficits. Psych: Normal affect. Responds appropriately.   Assessment:  No diagnosis found.  Plan:     Disposition: Follow up in ***   Medication Adjustments/Labs and Tests Ordered: Current medicines are reviewed at length with the patient today.  Concerns regarding medicines are outlined above.  No orders of the defined types were placed in this encounter.  No orders of the defined types were placed in this encounter.   There are no Patient Instructions on file for this visit.   Signed, Darreld Mclean, PA-C  06/06/2020 10:33 AM    [place]

## 2020-06-10 DIAGNOSIS — I1310 Hypertensive heart and chronic kidney disease without heart failure, with stage 1 through stage 4 chronic kidney disease, or unspecified chronic kidney disease: Secondary | ICD-10-CM | POA: Diagnosis not present

## 2020-06-10 DIAGNOSIS — M5020 Other cervical disc displacement, unspecified cervical region: Secondary | ICD-10-CM | POA: Diagnosis not present

## 2020-06-10 DIAGNOSIS — D631 Anemia in chronic kidney disease: Secondary | ICD-10-CM | POA: Diagnosis not present

## 2020-06-10 DIAGNOSIS — E1122 Type 2 diabetes mellitus with diabetic chronic kidney disease: Secondary | ICD-10-CM | POA: Diagnosis not present

## 2020-06-10 DIAGNOSIS — S301XXD Contusion of abdominal wall, subsequent encounter: Secondary | ICD-10-CM | POA: Diagnosis not present

## 2020-06-10 DIAGNOSIS — I252 Old myocardial infarction: Secondary | ICD-10-CM | POA: Diagnosis not present

## 2020-06-10 DIAGNOSIS — M5030 Other cervical disc degeneration, unspecified cervical region: Secondary | ICD-10-CM | POA: Diagnosis not present

## 2020-06-10 DIAGNOSIS — I2510 Atherosclerotic heart disease of native coronary artery without angina pectoris: Secondary | ICD-10-CM | POA: Diagnosis not present

## 2020-06-10 DIAGNOSIS — N189 Chronic kidney disease, unspecified: Secondary | ICD-10-CM | POA: Diagnosis not present

## 2020-06-10 NOTE — Progress Notes (Addendum)
Virtual Visit via Telephone Note   This visit type was conducted due to national recommendations for restrictions regarding the COVID-19 Pandemic (e.g. social distancing) in an effort to limit this patient's exposure and mitigate transmission in our community.  Due to her co-morbid illnesses, this patient is at least at moderate risk for complications without adequate follow up.  This format is felt to be most appropriate for this patient at this time.  The patient did not have access to video technology/had technical difficulties with video requiring transitioning to audio format only (telephone).  All issues noted in this document were discussed and addressed.  No physical exam could be performed with this format.  Please refer to the patient's chart for her  consent to telehealth for [facility]. Virtual platform was offered given ongoing worsening Covid-19 pandemic.  Date:  06/11/2020   ID:  Jessica [name], DOB 02/04/1951, MRN [id number]  Patient Location: Home Provider Location: [facility]  PCP:  Marton Redwood, MD  Cardiologist:  Shelva Majestic, MD  Electrophysiologist:  None   Evaluation Performed:  Follow-Up Visit  Chief Complaint: follow-up of CAD  History of Present Illness:    Jessica [name] is a 69 y.o. female with a history of CAD with STEMI in 12/2019 s/p balloon angioplasty to 100% RPDA lesion, paroxysmal SVT, hypertension, hyperlipidemia, type 2 diabetes mellitus, CKD stage III, hypothyroidism, and GERD who is followed by Dr. Claiborne Billings and presents today for follow-up.  Patient was admitted in 12/2019 with complaints of shortness of breath and nausea. EKG shows subtle ST elevation in inferior leads and V5-V6. Cardiac cath showed 100% stenosis of RPDA and 30% stenosis of proximal LAD. RDPA was a small vessel so balloon angioplasty was performed with 5% residual stenosis. Echo showed LVEF of 55-60% with moderate akinesis of inferior wallk, apical segment, and inferior septal  walls. Patient was readmitted again later that month with chest/epigastric pain with radiation to her back with mild relief with sublingual Nitro. Plan was for continued medical management with uptitration of her anti-anginals. Amlodipine and Imdur were started.   Patient was last seen by Dr. Claiborne Billings on 05/13/2020 at which time she was doing well from a cardiac standpoint. Coreg was switched to Toprol-XL because patient stated she was only taking her Coreg once daily. Patient was advised to follow-up in 6 months.   Unfortunately, later that same day patient presented back to the ED after a syncopal episode. She was walking quickly through her kitchen when she accidentally struck her abdomen against the corner of the countertop. This area began to swell and she felt malaise, fatigue, lightheadedness, and had a syncopal episode. She then developed persistent nausea and vomiting. Abdominal/pelvic CT showed a large abdominal wall hematoma. Her hemoglobin was 7.9. She ultimately required 2 units of PRBCs during admission. Aspirin and Brilinta were held on admission and Cardiology was consulted for recommendations on antiplatelet therapy. Dr. Tamala Julian reviewed initial cath films and determined only very small territory would be threatened if RPDA reoccludes. Aspirin monotherapy was recommended when felt safe from a bleeding standpoint.  Patient presents today for follow-up. Spoke with patient on the phone for virtual visit. She notes some shortness of breath with activity over the last 3-4 days but denies any shortness of breath at rest, orthopnea, PND, or lower extremity edema. No weight gain - she actually reports some weight loss. She had a short episode of chest discomfort last night at rest that only lasted a couple of seconds. No  other chest pain. No exertional chest pain. She still has a large abdominal hematoma and is still wearing the abdominal binder - she cannot really tell if it is improving or not. She had  repeat labs at her PCP's office about 3 weeks ago and states her hemoglobin was 8. She is scheduled to have labs again next week. She notes some heart racing if she does too much as well as some mild lightheadedness/dizziness if she stands too quickly. No syncope. No gross blood in urine or stools but patient is partially blind so hard to tell.   Past Medical History:  Diagnosis Date  . Anemia   . Anxiety   . CKD (chronic kidney disease)    CKD III per PCP notes-pt denies  . Congenital blindness   . Diabetes mellitus   . Diverticula of small intestine   . Esophageal stricture   . GERD (gastroesophageal reflux disease)   . H/O hiatal hernia   . Hyperlipidemia   . Hypertension   . Hypothyroidism   . Iron deficiency anemia   . Low magnesium level   . Low sodium levels   . Myocardial infarct ([place])   . Osteopenia   . Osteoporosis   . Postoperative nausea and vomiting   . Renal disease   . Retinitis   . STEMI (ST elevation myocardial infarction) ([place])   . SVT (supraventricular tachycardia) ([place])   . Vitamin B 12 deficiency    Past Surgical History:  Procedure Laterality Date  . BACK SURGERY    . CARPAL TUNNEL RELEASE     Right   . CESAREAN SECTION    . CHOLECYSTECTOMY  1985  . COLONOSCOPY    . CORONARY BALLOON ANGIOPLASTY N/A 12/30/2019   Procedure: CORONARY BALLOON ANGIOPLASTY;  Surgeon: Troy Sine, MD;  Location: [facility];  Service: Cardiovascular;  Laterality: N/A;  . CORONARY/GRAFT ACUTE MI REVASCULARIZATION N/A 12/30/2019   Procedure: Coronary/Graft Acute MI Revascularization;  Surgeon: Troy Sine, MD;  Location: [facility];  Service: Cardiovascular;  Laterality: N/A;  . HERNIA REPAIR    . LEFT HEART CATH AND CORONARY ANGIOGRAPHY N/A 12/30/2019   Procedure: LEFT HEART CATH AND CORONARY ANGIOGRAPHY;  Surgeon: Troy Sine, MD;  Location: [facility];  Service: Cardiovascular;  Laterality: N/A;  . Right knee arthroscopy  1993  . Right  thyroidectomy  2007  . TOTAL ABDOMINAL HYSTERECTOMY W/ BILATERAL SALPINGOOPHORECTOMY  1993  . UPPER GASTROINTESTINAL ENDOSCOPY       Current Meds  Medication Sig  . acetaminophen (TYLENOL) 325 MG tablet Take 2 tablets (650 mg total) by mouth every 4 (four) hours as needed for mild pain.  . Coenzyme Q10 (CO Q 10) 100 MG CAPS Take 100 mg by mouth daily.   . cyanocobalamin (,VITAMIN B-12,) 1000 MCG/ML injection Inject 1,000 mcg into the muscle every 30 (thirty) days.  . empagliflozin (JARDIANCE) 10 MG TABS tablet Take 10 mg by mouth daily.  . famotidine (PEPCID) 20 MG tablet Take 20 mg by mouth 2 (two) times daily.  . fluticasone (FLONASE) 50 MCG/ACT nasal spray Place 1 spray into both nostrils daily as needed for allergies.   Marland Kitchen glipiZIDE (GLUCOTROL) 10 MG tablet Take 10 mg by mouth daily.   Marland Kitchen levothyroxine (SYNTHROID) 75 MCG tablet Take 75 mcg by mouth daily.  Marland Kitchen LORazepam (ATIVAN) 0.5 MG tablet Take 0.5 mg by mouth See admin instructions. Take one tablet (0.5 mg) by mouth daily at bedtime, may also  take one tablet (0.5 mg) during the day as needed for anxiety.  . magnesium oxide (MAG-OX) 400 MG tablet Take 400 mg by mouth daily.  . metoprolol succinate (TOPROL-XL) 25 MG 24 hr tablet Take 12.5 mg by mouth daily.  . nitroGLYCERIN (NITROSTAT) 0.4 MG SL tablet Place 1 tablet (0.4 mg total) under the tongue every 5 (five) minutes x 3 doses as needed for chest pain.  . pantoprazole (PROTONIX) 40 MG tablet Take 40 mg by mouth daily.  . rosuvastatin (CRESTOR) 40 MG tablet Take 40 mg by mouth at bedtime.   . sertraline (ZOLOFT) 50 MG tablet Take 50 mg by mouth at bedtime.   . [DISCONTINUED] isosorbide dinitrate (ISORDIL) 30 MG tablet Take 15 mg by mouth daily.   . [DISCONTINUED] isosorbide mononitrate (IMDUR) 30 MG 24 hr tablet Take 0.5 tablets (15 mg total) by mouth daily.     Allergies:   Codeine, Erythromycin, Levaquin [levofloxacin], and Tramadol   Social History   Tobacco Use  . Smoking  status: Never Smoker  . Smokeless tobacco: Never Used  Vaping Use  . Vaping Use: Never used  Substance Use Topics  . Alcohol use: No  . Drug use: No     Family Hx: The patient's family history includes COPD in her father; CVA in her mother; Coronary artery disease in her father; Diabetes Mellitus II in her maternal grandmother and mother; Heart disease in her mother; Hyperlipidemia in her sister; Hypertension in her father, mother, sister, and sister; Uterine cancer in her sister. There is no history of Colon cancer, Rectal cancer, Stomach cancer, or Esophageal cancer.  ROS:   Please see the history of present illness.    All other systems reviewed and are negative.   Prior CV studies:    The following studies were reviewed today:   Cardiac Catheterization 12/30/2019:  Prox LAD lesion is 30% stenosed.  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  Post intervention, there is a 5% residual stenosis.   Mild nonobstructive stenosis of 30% in the proximal LAD in the region of the first diagonal and septal perforating artery.  Normal ramus intermediate and left circumflex coronary arteries.  Dominant RCA with total mid distal occlusion of the PDA vessel.  LVEDP 22 mmHg.  Recommendation: DAPT initially but since no stenting was performed duration may be less than 1 year.  Will DC bivalirudin several hours post procedure.  Hydrate.  Aggressive lipid-lowering therapy with target LDL less than 70 and optimal blood pressure control. _______________  Echocardiogram 12/31/2019: Impressions: 1. Left ventricular ejection fraction, by estimation, is 50 to 55%. The  left ventricle has low normal function. The left ventricle demonstrates  regional wall motion abnormalities (see scoring diagram/findings for  description). There is mild left  ventricular hypertrophy. Left ventricular diastolic parameters are  consistent with Grade I diastolic dysfunction (impaired relaxation). There  is moderate  hypokinesis of the left ventricular, entire inferior wall,  apical segment and inferoseptal wall.  2. Right ventricular systolic function is normal. The right ventricular  size is normal.  3. The mitral valve is grossly normal. No evidence of mitral valve  regurgitation.  4. The aortic valve is tricuspid. Aortic valve regurgitation is not  visualized.  5. The inferior vena cava is normal in size with greater than 50%  respiratory variability, suggesting right atrial pressure of 3 mmHg.   Labs/Other Tests and Data Reviewed:    EKG: Last EKG from 05/14/2020 personally reviewed and showed: Normal sinus rhythm, rate 91 bpm,  with no acute ST/T changes. Normal axis. QTc 475 ms.  Recent Labs: 12/30/2019: TSH 0.203 05/13/2020: ALT 54 05/16/2020: BUN 23; Creatinine, Ser 2.07; Hemoglobin 8.3; Platelets 188; Potassium 4.4; Sodium 137   Recent Lipid Panel Lab Results  Component Value Date/Time   CHOL 116 12/31/2019 03:25 AM   TRIG 216 (H) 12/31/2019 03:25 AM   HDL 26 (L) 12/31/2019 03:25 AM   CHOLHDL 4.5 12/31/2019 03:25 AM   LDLCALC 47 12/31/2019 03:25 AM    Wt Readings from Last 3 Encounters:  06/11/20 120 lb 3.2 oz (54.5 kg)  05/13/20 124 lb (56.2 kg)  05/13/20 125 lb (56.7 kg)     Objective:    Vital Signs:  BP 112/62   Pulse 82   Ht 5\' 1"  (1.549 m)   Wt 120 lb 3.2 oz (54.5 kg)   BMI 22.71 kg/m    Vital Signs Reviewed. General: No acute distress. Pulm: No labored breathing. No coughing during visit. No audible wheezing. Speaking in full sentences. Neuro: Alert and oriented. No slurred speech. Answers questions appropriately. Psych: Pleasant affect.  ASSESSMENT & PLAN:    Dyspnea On Exertion - Patient notes some dyspnea on exertion over the last few days. Denies any dyspnea at rest, orthopnea, PND, edema, or weight gain so does not sound like CHF.  - Echo in 12/2019 showed LVEF of 50-55% with moderate akinesis of inferior wallk, apical segment, and inferior septal  walls.  - Unable to assess volume status over the phone but history does not sound like CHF. Possibly due to acute anemia with abdominal wall hematoma as well as some deconditioning since recent hospitalization. Patient is supposed to have a repeat CBC next week to ensure hemoglobin is stable. Advised patient to let us know if dyspnea worsens or she develops edema or weight gain.  CAD - History of STEMI in 12/2019 s/p balloon angioplasty to RPDA.  - She notes a few seconds of chest discomfort last night at rest that resolved on its on but no angina. No exertional pain. - Initially on DAPT with Aspirin and Brilinta. However, recently stopped after she developed abdominal wall hematoma (felt to be OK given only small territory would be threatened if RPDA reoccluded). It was recommended that Aspirin be restarted when OK with Surgery. Will check with surgeon's office to see if OK to restart Aspirin 81mg  assuming hemoglobin stable at PCP's office next week. - Continue Toprol-XL 12.5mg  daily, Isosorbide dinitrate 15mg  daily, and Crestor 40mg  daily.  Hypertension - BP well controlled but soft at times. She notes systolic BP was <948 yesterday. - Previously on Amldoipien but this has been discontinued.  - Continue Isosorbide dinitrate 15mg  daily. Hold if systolic BP <546 mmHg. Discussed discontinuing this given soft BP but patient would rather continue it and only hold if needed.  - Continue Toprol-XL 12.5mg  daily.  Hyperlipidemia - Lipid panel from 12/2019: Total Cholesterol 116, Triglycerides 216, HDL 26, LDL 47.  - LDL goal <70 given CAD. - Continue Crestor 40mg  daily.   Type 2 Diabetes - Hemoglobin A1c 7.4 in 04/2020.  - On Jardiance at home. - Managed by PCP.  CKD Stage IV - Baseline creatinine around 1.9 to 2.1.  Time:   Today, I have spent 15 minutes with the patient with telehealth technology discussing the above problems.     Medication Adjustments/Labs and Tests Ordered: Current  medicines are reviewed at length with the patient today.  Concerns regarding medicines are outlined above.   Follow Up:  In Person in 3 month(s) with Dr. Claiborne Billings.  Signed, Darreld Mclean, PA-C  06/11/2020 10:19 AM    [facility]  ADDENDUM:  I called and spoke with triage RN at [facility] who stated at last visit with them on 06/05/2020 patient was told she could follow-up with them on an as needed basis and therefore it was OK to restart Aspirin 81mg  daily. Will wait and make sure hemoglobin is stable on labs next week at PCP's office before restarting.  Darreld Mclean, PA-C 06/11/2020 2:06 PM

## 2020-06-11 ENCOUNTER — Ambulatory Visit: Payer: Medicare HMO | Admitting: Student

## 2020-06-11 ENCOUNTER — Encounter: Payer: Self-pay | Admitting: Student

## 2020-06-11 ENCOUNTER — Telehealth (INDEPENDENT_AMBULATORY_CARE_PROVIDER_SITE_OTHER): Payer: Medicare HMO | Admitting: Student

## 2020-06-11 ENCOUNTER — Telehealth: Payer: Self-pay | Admitting: *Deleted

## 2020-06-11 VITALS — BP 112/62 | HR 82 | Ht 61.0 in | Wt 120.2 lb

## 2020-06-11 DIAGNOSIS — R0609 Other forms of dyspnea: Secondary | ICD-10-CM

## 2020-06-11 DIAGNOSIS — E118 Type 2 diabetes mellitus with unspecified complications: Secondary | ICD-10-CM

## 2020-06-11 DIAGNOSIS — R0600 Dyspnea, unspecified: Secondary | ICD-10-CM

## 2020-06-11 DIAGNOSIS — N184 Chronic kidney disease, stage 4 (severe): Secondary | ICD-10-CM

## 2020-06-11 DIAGNOSIS — I10 Essential (primary) hypertension: Secondary | ICD-10-CM

## 2020-06-11 DIAGNOSIS — E785 Hyperlipidemia, unspecified: Secondary | ICD-10-CM | POA: Diagnosis not present

## 2020-06-11 DIAGNOSIS — I2510 Atherosclerotic heart disease of native coronary artery without angina pectoris: Secondary | ICD-10-CM | POA: Diagnosis not present

## 2020-06-11 NOTE — Patient Instructions (Addendum)
Medication Instructions:  Your physician recommends that you continue on your current medications as directed. Please refer to the Current Medication list given to you today.  *If you need a refill on your cardiac medications before your next appointment, please call your pharmacy*  Lab Work: HAVE YOUR PRIMARY CARE SEND COPY OF YOUR LABS TO Elnita Maxwell PA   Testing/Procedures: NONE   Follow-Up: At [facility], you and your health needs are our priority.  As part of our continuing mission to provide you with exceptional heart care, we have created designated Provider Care Teams.  These Care Teams include your primary Cardiologist (physician) and Advanced Practice Providers (APPs -  Physician Assistants and Nurse Practitioners) who all work together to provide you with the care you need, when you need it.  We recommend signing up for the patient portal called "MyChart".  Sign up information is provided on this After Visit Summary.  MyChart is used to connect with patients for Virtual Visits (Telemedicine).  Patients are able to view lab/test results, encounter notes, upcoming appointments, etc.  Non-urgent messages can be sent to your provider as well.   To learn more about what you can do with MyChart, go to [email or web address].    Your next appointment:   DR [facility] 09/11/2020 AT 2:40 IN PERSON   Other Instructions HOLD YOUR ISOSORBIDE DINITRATE IF TOP NUMBER OF BLOOD PRESSURE LESS THAN 100

## 2020-06-11 NOTE — Telephone Encounter (Signed)
Ms. Jessica, [name] are scheduled for a virtual visit with your provider today.    Just as we do with appointments in the office, we must obtain your consent to participate.  Your consent will be active for this visit and any virtual visit you may have with one of our providers in the next 365 days.    If you have a MyChart account, I can also send a copy of this consent to you electronically.  All virtual visits are billed to your insurance company just like a traditional visit in the office.  As this is a virtual visit, video technology does not allow for your provider to perform a traditional examination.  This may limit your provider's ability to fully assess your condition.  If your provider identifies any concerns that need to be evaluated in person or the need to arrange testing such as labs, EKG, etc, we will make arrangements to do so.    Although advances in technology are sophisticated, we cannot ensure that it will always work on either your end or our end.  If the connection with a video visit is poor, we may have to switch to a telephone visit.  With either a video or telephone visit, we are not always able to ensure that we have a secure connection.   I need to obtain your verbal consent now.   Are you willing to proceed with your visit today?   Jessica [name] has provided verbal consent on 06/11/2020 for a virtual visit (video or telephone).   Alvina Filbert, LPN 16/60/6004  5:99 AM

## 2020-06-17 DIAGNOSIS — E1122 Type 2 diabetes mellitus with diabetic chronic kidney disease: Secondary | ICD-10-CM | POA: Diagnosis not present

## 2020-06-17 DIAGNOSIS — M5030 Other cervical disc degeneration, unspecified cervical region: Secondary | ICD-10-CM | POA: Diagnosis not present

## 2020-06-17 DIAGNOSIS — I2510 Atherosclerotic heart disease of native coronary artery without angina pectoris: Secondary | ICD-10-CM | POA: Diagnosis not present

## 2020-06-17 DIAGNOSIS — S301XXD Contusion of abdominal wall, subsequent encounter: Secondary | ICD-10-CM | POA: Diagnosis not present

## 2020-06-17 DIAGNOSIS — D631 Anemia in chronic kidney disease: Secondary | ICD-10-CM | POA: Diagnosis not present

## 2020-06-17 DIAGNOSIS — I252 Old myocardial infarction: Secondary | ICD-10-CM | POA: Diagnosis not present

## 2020-06-17 DIAGNOSIS — N189 Chronic kidney disease, unspecified: Secondary | ICD-10-CM | POA: Diagnosis not present

## 2020-06-17 DIAGNOSIS — I1310 Hypertensive heart and chronic kidney disease without heart failure, with stage 1 through stage 4 chronic kidney disease, or unspecified chronic kidney disease: Secondary | ICD-10-CM | POA: Diagnosis not present

## 2020-06-17 DIAGNOSIS — M5020 Other cervical disc displacement, unspecified cervical region: Secondary | ICD-10-CM | POA: Diagnosis not present

## 2020-06-20 DIAGNOSIS — E1122 Type 2 diabetes mellitus with diabetic chronic kidney disease: Secondary | ICD-10-CM | POA: Diagnosis not present

## 2020-06-20 DIAGNOSIS — D631 Anemia in chronic kidney disease: Secondary | ICD-10-CM | POA: Diagnosis not present

## 2020-06-20 DIAGNOSIS — S301XXD Contusion of abdominal wall, subsequent encounter: Secondary | ICD-10-CM | POA: Diagnosis not present

## 2020-06-20 DIAGNOSIS — I252 Old myocardial infarction: Secondary | ICD-10-CM | POA: Diagnosis not present

## 2020-06-20 DIAGNOSIS — I1310 Hypertensive heart and chronic kidney disease without heart failure, with stage 1 through stage 4 chronic kidney disease, or unspecified chronic kidney disease: Secondary | ICD-10-CM | POA: Diagnosis not present

## 2020-06-20 DIAGNOSIS — I2510 Atherosclerotic heart disease of native coronary artery without angina pectoris: Secondary | ICD-10-CM | POA: Diagnosis not present

## 2020-06-20 DIAGNOSIS — N189 Chronic kidney disease, unspecified: Secondary | ICD-10-CM | POA: Diagnosis not present

## 2020-06-20 DIAGNOSIS — M5030 Other cervical disc degeneration, unspecified cervical region: Secondary | ICD-10-CM | POA: Diagnosis not present

## 2020-06-20 DIAGNOSIS — M5020 Other cervical disc displacement, unspecified cervical region: Secondary | ICD-10-CM | POA: Diagnosis not present

## 2020-06-24 DIAGNOSIS — R700 Elevated erythrocyte sedimentation rate: Secondary | ICD-10-CM | POA: Diagnosis not present

## 2020-06-24 DIAGNOSIS — D649 Anemia, unspecified: Secondary | ICD-10-CM | POA: Diagnosis not present

## 2020-06-25 ENCOUNTER — Telehealth: Payer: Self-pay | Admitting: Cardiovascular Disease

## 2020-06-25 DIAGNOSIS — I2510 Atherosclerotic heart disease of native coronary artery without angina pectoris: Secondary | ICD-10-CM

## 2020-06-25 DIAGNOSIS — D649 Anemia, unspecified: Secondary | ICD-10-CM

## 2020-06-25 DIAGNOSIS — S301XXS Contusion of abdominal wall, sequela: Secondary | ICD-10-CM

## 2020-06-25 NOTE — Telephone Encounter (Signed)
Left message for pt to call back  °

## 2020-06-25 NOTE — Telephone Encounter (Signed)
Patient states she is returning a call to provide CBC panel results.

## 2020-06-27 DIAGNOSIS — I252 Old myocardial infarction: Secondary | ICD-10-CM | POA: Diagnosis not present

## 2020-06-27 DIAGNOSIS — E1122 Type 2 diabetes mellitus with diabetic chronic kidney disease: Secondary | ICD-10-CM | POA: Diagnosis not present

## 2020-06-27 DIAGNOSIS — I2510 Atherosclerotic heart disease of native coronary artery without angina pectoris: Secondary | ICD-10-CM | POA: Diagnosis not present

## 2020-06-27 DIAGNOSIS — N189 Chronic kidney disease, unspecified: Secondary | ICD-10-CM | POA: Diagnosis not present

## 2020-06-27 DIAGNOSIS — M5030 Other cervical disc degeneration, unspecified cervical region: Secondary | ICD-10-CM | POA: Diagnosis not present

## 2020-06-27 DIAGNOSIS — S301XXD Contusion of abdominal wall, subsequent encounter: Secondary | ICD-10-CM | POA: Diagnosis not present

## 2020-06-27 DIAGNOSIS — D631 Anemia in chronic kidney disease: Secondary | ICD-10-CM | POA: Diagnosis not present

## 2020-06-27 DIAGNOSIS — M5020 Other cervical disc displacement, unspecified cervical region: Secondary | ICD-10-CM | POA: Diagnosis not present

## 2020-06-27 DIAGNOSIS — I1310 Hypertensive heart and chronic kidney disease without heart failure, with stage 1 through stage 4 chronic kidney disease, or unspecified chronic kidney disease: Secondary | ICD-10-CM | POA: Diagnosis not present

## 2020-06-27 MED ORDER — ASPIRIN EC 81 MG PO TBEC: 81.0000 mg | DELAYED_RELEASE_TABLET | Freq: Every day | ORAL | 11 refills | Status: DC

## 2020-06-27 NOTE — Addendum Note (Signed)
Addended by: Sande Rives on: 06/27/2020 02:49 PM   Modules accepted: Orders

## 2020-06-27 NOTE — Telephone Encounter (Signed)
Patient calling back. She states her PCP sent her lab work Tuesday and she would like to know if it was received.

## 2020-06-27 NOTE — Telephone Encounter (Signed)
Called and spoke with patient. I saw this patient a couple of weeks ago for a virtual visit. We did receive labs from PCP. They will be scanned in. Hemoglobin has improved and was stable at 11.0. Patient also notes abdominal hematoma is improving. Therefore, would like to restart low dose Aspirin 81mg  daily. Please see last office visit from 06/11/2020 for more information. Will repeat CBC in 1-2 weeks to ensure hemoglobin is stable.  Patient also noted brief episode of chest pain that only lasted for a couple of seconds last night while at rest. She described similar pain at last office visit. She did take a Nitro because she was worried but this does not sound like true angina. No exertional pain. Asked patient to let us know if she has more chest pain. She also notes BP was elevated in the 170's/90's and HR was in the low 100's last night. She took an extra dose of her Metoprolol. BP and HR normal today. Asked her to continue to monitor BP and HR and let us know if they are elevated. She voiced understanding and agreed.  She also mentioned abdominal pain above her umbilicus. She has a history of GERD and GI issues. She is scheduled to see her PCP on Monday and will discuss these symptoms with them at that time.  Darreld Mclean, PA-C 06/27/2020 2:46 PM

## 2020-07-01 DIAGNOSIS — E1122 Type 2 diabetes mellitus with diabetic chronic kidney disease: Secondary | ICD-10-CM | POA: Diagnosis not present

## 2020-07-01 DIAGNOSIS — I2510 Atherosclerotic heart disease of native coronary artery without angina pectoris: Secondary | ICD-10-CM | POA: Diagnosis not present

## 2020-07-01 DIAGNOSIS — I252 Old myocardial infarction: Secondary | ICD-10-CM | POA: Diagnosis not present

## 2020-07-01 DIAGNOSIS — N189 Chronic kidney disease, unspecified: Secondary | ICD-10-CM | POA: Diagnosis not present

## 2020-07-01 DIAGNOSIS — D631 Anemia in chronic kidney disease: Secondary | ICD-10-CM | POA: Diagnosis not present

## 2020-07-01 DIAGNOSIS — S301XXD Contusion of abdominal wall, subsequent encounter: Secondary | ICD-10-CM | POA: Diagnosis not present

## 2020-07-01 DIAGNOSIS — I1310 Hypertensive heart and chronic kidney disease without heart failure, with stage 1 through stage 4 chronic kidney disease, or unspecified chronic kidney disease: Secondary | ICD-10-CM | POA: Diagnosis not present

## 2020-07-01 DIAGNOSIS — M5030 Other cervical disc degeneration, unspecified cervical region: Secondary | ICD-10-CM | POA: Diagnosis not present

## 2020-07-01 DIAGNOSIS — M5020 Other cervical disc displacement, unspecified cervical region: Secondary | ICD-10-CM | POA: Diagnosis not present

## 2020-07-07 DIAGNOSIS — I2510 Atherosclerotic heart disease of native coronary artery without angina pectoris: Secondary | ICD-10-CM | POA: Diagnosis not present

## 2020-07-07 DIAGNOSIS — I252 Old myocardial infarction: Secondary | ICD-10-CM | POA: Diagnosis not present

## 2020-07-07 DIAGNOSIS — D631 Anemia in chronic kidney disease: Secondary | ICD-10-CM | POA: Diagnosis not present

## 2020-07-07 DIAGNOSIS — E1122 Type 2 diabetes mellitus with diabetic chronic kidney disease: Secondary | ICD-10-CM | POA: Diagnosis not present

## 2020-07-07 DIAGNOSIS — S301XXD Contusion of abdominal wall, subsequent encounter: Secondary | ICD-10-CM | POA: Diagnosis not present

## 2020-07-07 DIAGNOSIS — M5020 Other cervical disc displacement, unspecified cervical region: Secondary | ICD-10-CM | POA: Diagnosis not present

## 2020-07-07 DIAGNOSIS — N189 Chronic kidney disease, unspecified: Secondary | ICD-10-CM | POA: Diagnosis not present

## 2020-07-07 DIAGNOSIS — I1310 Hypertensive heart and chronic kidney disease without heart failure, with stage 1 through stage 4 chronic kidney disease, or unspecified chronic kidney disease: Secondary | ICD-10-CM | POA: Diagnosis not present

## 2020-07-07 DIAGNOSIS — M5030 Other cervical disc degeneration, unspecified cervical region: Secondary | ICD-10-CM | POA: Diagnosis not present

## 2020-07-14 ENCOUNTER — Other Ambulatory Visit: Payer: Self-pay | Admitting: Physician Assistant

## 2020-07-18 ENCOUNTER — Telehealth: Payer: Self-pay | Admitting: Unknown Physician Specialty

## 2020-07-18 ENCOUNTER — Other Ambulatory Visit: Payer: Self-pay | Admitting: Unknown Physician Specialty

## 2020-07-18 DIAGNOSIS — U071 COVID-19: Secondary | ICD-10-CM

## 2020-07-18 DIAGNOSIS — I2111 ST elevation (STEMI) myocardial infarction involving right coronary artery: Secondary | ICD-10-CM

## 2020-07-18 DIAGNOSIS — E118 Type 2 diabetes mellitus with unspecified complications: Secondary | ICD-10-CM

## 2020-07-18 NOTE — Telephone Encounter (Signed)
I connected by phone with Jessica [name] on 07/18/2020 at 4:15 PM to discuss the potential use of a new treatment for mild to moderate COVID-19 viral infection in non-hospitalized patients.  This patient is a 69 y.o. female that meets the FDA criteria for Emergency Use Authorization of COVID monoclonal antibody casirivimab/imdevimab, bamlanivimab/etesevimab, or sotrovimab.  Has a (+) direct SARS-CoV-2 viral test result  Has mild or moderate COVID-19   Is NOT hospitalized due to COVID-19  Is within 10 days of symptom onset  Has at least one of the high risk factor(s) for progression to severe COVID-19 and/or hospitalization as defined in EUA.  Specific high risk criteria : Older age (>/= 71 yo)   I have spoken and communicated the following to the patient or parent/caregiver regarding COVID monoclonal antibody treatment:  1. FDA has authorized the emergency use for the treatment of mild to moderate COVID-19 in adults and pediatric patients with positive results of direct SARS-CoV-2 viral testing who are 43 years of age and older weighing at least 40 kg, and who are at high risk for progressing to severe COVID-19 and/or hospitalization.  2. The significant known and potential risks and benefits of COVID monoclonal antibody, and the extent to which such potential risks and benefits are unknown.  3. Information on available alternative treatments and the risks and benefits of those alternatives, including clinical trials.  4. Patients treated with COVID monoclonal antibody should continue to self-isolate and use infection control measures (e.g., wear mask, isolate, social distance, avoid sharing personal items, clean and disinfect "high touch" surfaces, and frequent handwashing) according to CDC guidelines.   5. The patient or parent/caregiver has the option to accept or refuse COVID monoclonal antibody treatment.  After reviewing this information with the patient, the patient has agreed to  receive one of the available covid 19 monoclonal antibodies and will be provided an appropriate fact sheet prior to infusion. Kathrine Haddock, NP 07/18/2020 4:15 PM  Sx onset 12/21

## 2020-07-21 ENCOUNTER — Ambulatory Visit (HOSPITAL_COMMUNITY)
Admission: RE | Admit: 2020-07-21 | Discharge: 2020-07-21 | Disposition: A | Discharge status: Home / Self Care | Payer: Medicare Other | Source: Ambulatory Visit | Attending: Pulmonary Disease | Admitting: Pulmonary Disease

## 2020-07-21 DIAGNOSIS — E118 Type 2 diabetes mellitus with unspecified complications: Secondary | ICD-10-CM | POA: Diagnosis present

## 2020-07-21 DIAGNOSIS — Z23 Encounter for immunization: Secondary | ICD-10-CM | POA: Diagnosis not present

## 2020-07-21 DIAGNOSIS — U071 COVID-19: Secondary | ICD-10-CM | POA: Diagnosis present

## 2020-07-21 DIAGNOSIS — I2111 ST elevation (STEMI) myocardial infarction involving right coronary artery: Secondary | ICD-10-CM | POA: Diagnosis present

## 2020-07-21 MED ORDER — SODIUM CHLORIDE 0.9 % IV SOLN: Freq: Once | INTRAVENOUS | Status: AC

## 2020-07-21 MED ORDER — METHYLPREDNISOLONE SODIUM SUCC 125 MG IJ SOLR: 125.0000 mg | Freq: Once | INTRAMUSCULAR | Status: DC | PRN

## 2020-07-21 MED ORDER — FAMOTIDINE IN NACL 20-0.9 MG/50ML-% IV SOLN: 20.0000 mg | Freq: Once | INTRAVENOUS | Status: DC | PRN

## 2020-07-21 MED ORDER — EPINEPHRINE 0.3 MG/0.3ML IJ SOAJ: 0.3000 mg | Freq: Once | INTRAMUSCULAR | Status: DC | PRN

## 2020-07-21 MED ORDER — DIPHENHYDRAMINE HCL 50 MG/ML IJ SOLN: 50.0000 mg | Freq: Once | INTRAMUSCULAR | Status: DC | PRN

## 2020-07-21 MED ORDER — SODIUM CHLORIDE 0.9 % IV SOLN: INTRAVENOUS | Status: DC | PRN

## 2020-07-21 MED ORDER — ALBUTEROL SULFATE HFA 108 (90 BASE) MCG/ACT IN AERS: 2.0000 | INHALATION_SPRAY | Freq: Once | RESPIRATORY_TRACT | Status: DC | PRN

## 2020-07-21 NOTE — Discharge Instructions (Signed)

## 2020-07-21 NOTE — Progress Notes (Signed)
Patient reviewed Fact Sheet for Patients, Parents, and Caregivers for Emergency Use Authorization (EUA) of casi for the Treatment of Coronavirus. Patient also reviewed and is agreeable to the estimated cost of treatment. Patient is agreeable to proceed.

## 2020-07-21 NOTE — Progress Notes (Signed)
  Diagnosis: COVID-19  Physician: Dr. Joya Gaskins   Procedure: [facility] Infusion [facility] Med: casirivimab\imdevimab infusion - Provided patient with casirivimab\imdevimab fact sheet for patients, parents and caregivers prior to infusion.  Complications: No immediate complications noted.  Discharge: Discharged home   Jessica [name] 07/21/2020

## 2020-08-06 DIAGNOSIS — E538 Deficiency of other specified B group vitamins: Secondary | ICD-10-CM | POA: Diagnosis not present

## 2020-08-07 DIAGNOSIS — E039 Hypothyroidism, unspecified: Secondary | ICD-10-CM | POA: Diagnosis not present

## 2020-08-07 DIAGNOSIS — E1129 Type 2 diabetes mellitus with other diabetic kidney complication: Secondary | ICD-10-CM | POA: Diagnosis not present

## 2020-08-07 DIAGNOSIS — E785 Hyperlipidemia, unspecified: Secondary | ICD-10-CM | POA: Diagnosis not present

## 2020-08-13 DIAGNOSIS — I700 Atherosclerosis of aorta: Secondary | ICD-10-CM | POA: Diagnosis not present

## 2020-08-13 DIAGNOSIS — E039 Hypothyroidism, unspecified: Secondary | ICD-10-CM | POA: Diagnosis not present

## 2020-08-13 DIAGNOSIS — Z0000 Encounter for general adult medical examination without abnormal findings: Secondary | ICD-10-CM | POA: Diagnosis not present

## 2020-08-13 DIAGNOSIS — I252 Old myocardial infarction: Secondary | ICD-10-CM | POA: Diagnosis not present

## 2020-08-13 DIAGNOSIS — N184 Chronic kidney disease, stage 4 (severe): Secondary | ICD-10-CM | POA: Diagnosis not present

## 2020-08-13 DIAGNOSIS — E1129 Type 2 diabetes mellitus with other diabetic kidney complication: Secondary | ICD-10-CM | POA: Diagnosis not present

## 2020-08-13 DIAGNOSIS — Z9861 Coronary angioplasty status: Secondary | ICD-10-CM | POA: Diagnosis not present

## 2020-08-13 DIAGNOSIS — I129 Hypertensive chronic kidney disease with stage 1 through stage 4 chronic kidney disease, or unspecified chronic kidney disease: Secondary | ICD-10-CM | POA: Diagnosis not present

## 2020-08-13 DIAGNOSIS — I2510 Atherosclerotic heart disease of native coronary artery without angina pectoris: Secondary | ICD-10-CM | POA: Diagnosis not present

## 2020-08-13 DIAGNOSIS — R82998 Other abnormal findings in urine: Secondary | ICD-10-CM | POA: Diagnosis not present

## 2020-08-13 DIAGNOSIS — I10 Essential (primary) hypertension: Secondary | ICD-10-CM | POA: Diagnosis not present

## 2020-08-15 ENCOUNTER — Telehealth: Payer: Self-pay

## 2020-08-15 NOTE — Telephone Encounter (Signed)
Patient is calling and was wondering if you have received lab work from her PCP.  Please call to discuss. Thank you.

## 2020-09-04 DIAGNOSIS — Z1231 Encounter for screening mammogram for malignant neoplasm of breast: Secondary | ICD-10-CM | POA: Diagnosis not present

## 2020-09-11 ENCOUNTER — Ambulatory Visit: Payer: Medicare HMO | Admitting: Cardiovascular Disease

## 2020-09-11 ENCOUNTER — Encounter: Payer: Self-pay | Admitting: Cardiovascular Disease

## 2020-09-11 ENCOUNTER — Other Ambulatory Visit: Payer: Self-pay

## 2020-09-11 VITALS — BP 138/90 | HR 70 | Ht 61.0 in | Wt 122.0 lb

## 2020-09-11 DIAGNOSIS — E118 Type 2 diabetes mellitus with unspecified complications: Secondary | ICD-10-CM | POA: Diagnosis not present

## 2020-09-11 DIAGNOSIS — E785 Hyperlipidemia, unspecified: Secondary | ICD-10-CM

## 2020-09-11 DIAGNOSIS — I10 Essential (primary) hypertension: Secondary | ICD-10-CM

## 2020-09-11 DIAGNOSIS — S301XXS Contusion of abdominal wall, sequela: Secondary | ICD-10-CM

## 2020-09-11 DIAGNOSIS — I2111 ST elevation (STEMI) myocardial infarction involving right coronary artery: Secondary | ICD-10-CM

## 2020-09-11 DIAGNOSIS — E039 Hypothyroidism, unspecified: Secondary | ICD-10-CM | POA: Diagnosis not present

## 2020-09-11 MED ORDER — METOPROLOL SUCCINATE ER 25 MG PO TB24: 25.0000 mg | ORAL_TABLET | Freq: Every day | ORAL | 1 refills | Status: DC

## 2020-09-11 NOTE — Progress Notes (Signed)
Cardiology Office Note    Date:  09/13/2020   ID:  Jessica, [name] 10/06/1950, MRN [id number]  PCP:  Marton Redwood, MD  Cardiologist:  Shelva Majestic, MD   F/U office visit with me  History of Present Illness:  Jessica [name] is a 70 y.o. female who suffered an acute coronary syndrome on December 30, 2019.  She presents for a 35-month follow-up cardiology evaluation   Jessica [name]  has a history of hypertension and hyperlipidemia.  In early June 2021 she began to experience episodic shortness of breath.  On the morning of December 30, 2019 she experienced significant substernal chest pressure and tightness with radiation to her back.  She presented to [facility] emergency room where her ECG initially showed early inferior ST elevation.  A code STEMI was activated.  She was taken emergently to the catheterization laboratory by me and was found to have mild nonobstructive stenosis of 30% in the proximal LAD in the region of the first diagonal septal perforating artery.  She had a normal ramus intermediate and left circumflex vessel.  She had a dominant RCA with total mid distal occlusion of the PDA vessel.  Her vessel caliber was small and she underwent successful balloon angioplasty with restoration of TIMI-3 flow.  When evaluated by Jory Sims in July 2021 she continued to be on antiplatelet therapy with aspirin and Brilinta.  An initial echo Doppler study had shown an EF of 55 to 60% with grade 1 diastolic dysfunction with wall motion abnormality involving the inferior wall apical segment inferoseptal segment.  She was readmitted to the hospital on January 13, 2020 with associated chest and epigastric pain with radiation to her upper back and shoulder blades.  Amlodipine and Imdur were added to her medical regimen.  She subsequently developed hypotension and amlodipine dose was decreased to 5 mg and carvedilol to 6.25 mg twice a day.  I saw her in October 2021 for my initial in office evaluation.  At that time  he denied any recurrent chest pain off and felt like she had a yawn to take a deep breath.   She was on aspirin and ticagrelor for antiplatelet therapy.  She waas on isosorbide 15 mg and carvedilol 6.25 mg twice a day; however was only taking carvedilol on a daily basis. She is diabetic on Jardiance 10 mg and has hypothyroidism on levothyroxine 75 mcg. On rosuvastatin 20 mg LDL cholesterol was 47.  During my initial evaluation, since she was having difficulty taking the carvedilol twice a day I changed her to metoprolol succinate 12.5 mg daily.  She had been on levothyroxine for hypothyroidism and laboratory by her primary physician in June 2021 revealed her to be over suppressed.  She sees Dr. Lang Snow for her primary care.  Since I saw her, she was evaluated in a telemedicine visit by Sande Rives, PA-C in November.  This evaluation was prompted by her prior emergency room visit where she presented with syncope.  Apparently, the patient was walking through her kitchen at a fast pace and accidentally struck her abdomen against the corner of the countertop.  He is legally blind.  An abdominal/pelvic CT showed a large abdominal wall hematoma secondary to her blunt trauma.  She required several units of packed red blood cells.  Aspirin and Brilinta were held.  Ultimately it was felt that she could discontinue Brilinta she had previously undergone balloon angioplasty not stenting due to her all distal vessel.  When valuated by  Sande Rives, her hemoglobin had increased.  Time she had noted some mild lightheadedness and dizziness.  She had Covid in December 20221 and underwent antibody infusion therapy.  Her symptoms included weakness, fatigue, cough and fever.  Presently, she does admit at times to her heart racing.  Still notes a slight lump in her abdomen at the site of her recent blunt trauma from hitting the corner of her countertop.  Her hemoglobin has been stable and she states most recently it was  13.2.  She denies recurrent anginal symptoms.  She presents for evaluation.  Past Medical History:  Diagnosis Date  . Anemia   . Anxiety   . CKD (chronic kidney disease)    CKD III per PCP notes-pt denies  . Congenital blindness   . Diabetes mellitus   . Diverticula of small intestine   . Esophageal stricture   . GERD (gastroesophageal reflux disease)   . H/O hiatal hernia   . Hyperlipidemia   . Hypertension   . Hypothyroidism   . Iron deficiency anemia   . Low magnesium level   . Low sodium levels   . Myocardial infarct ([place])   . Osteopenia   . Osteoporosis   . Postoperative nausea and vomiting   . Renal disease   . Retinitis   . STEMI (ST elevation myocardial infarction) ([place])   . SVT (supraventricular tachycardia) ([place])   . Vitamin B 12 deficiency     Past Surgical History:  Procedure Laterality Date  . BACK SURGERY    . CARPAL TUNNEL RELEASE     Right   . CESAREAN SECTION    . CHOLECYSTECTOMY  1985  . COLONOSCOPY    . CORONARY BALLOON ANGIOPLASTY N/A 12/30/2019   Procedure: CORONARY BALLOON ANGIOPLASTY;  Surgeon: Troy Sine, MD;  Location: [place] [facility];  Service: Cardiovascular;  Laterality: N/A;  . CORONARY/GRAFT ACUTE MI REVASCULARIZATION N/A 12/30/2019   Procedure: Coronary/Graft Acute MI Revascularization;  Surgeon: Troy Sine, MD;  Location: [place] [facility];  Service: Cardiovascular;  Laterality: N/A;  . HERNIA REPAIR    . LEFT HEART CATH AND CORONARY ANGIOGRAPHY N/A 12/30/2019   Procedure: LEFT HEART CATH AND CORONARY ANGIOGRAPHY;  Surgeon: Troy Sine, MD;  Location: [place] [facility];  Service: Cardiovascular;  Laterality: N/A;  . Right knee arthroscopy  1993  . Right thyroidectomy  2007  . TOTAL ABDOMINAL HYSTERECTOMY W/ BILATERAL SALPINGOOPHORECTOMY  1993  . UPPER GASTROINTESTINAL ENDOSCOPY      Current Medications: Outpatient Medications Prior to Visit  Medication Sig Dispense Refill  . acetaminophen (TYLENOL) 325 MG tablet Take  2 tablets (650 mg total) by mouth every 4 (four) hours as needed for mild pain.    Marland Kitchen aspirin EC 81 MG tablet Take 1 tablet (81 mg total) by mouth daily. Swallow whole. 30 tablet 11  . Coenzyme Q10 (CO Q 10) 100 MG CAPS Take 100 mg by mouth daily.     . cyanocobalamin (,VITAMIN B-12,) 1000 MCG/ML injection Inject 1,000 mcg into the muscle every 30 (thirty) days.    . empagliflozin (JARDIANCE) 10 MG TABS tablet Take 10 mg by mouth daily.    . famotidine (PEPCID) 20 MG tablet Take 20 mg by mouth 2 (two) times daily.    . fluticasone (FLONASE) 50 MCG/ACT nasal spray Place 1 spray into both nostrils daily as needed for allergies.     Marland Kitchen glipiZIDE (GLUCOTROL) 10 MG tablet Take 10 mg by mouth daily.     Marland Kitchen  isosorbide dinitrate (ISORDIL) 30 MG tablet Take by mouth as directed. TAKE 1/2 TABLET DAILY    . levothyroxine (SYNTHROID) 50 MCG tablet Take 50 mcg by mouth daily before breakfast. 1 Tablet Daily Before Breakfast    . LORazepam (ATIVAN) 0.5 MG tablet Take 0.5 mg by mouth See admin instructions. Take one tablet (0.5 mg) by mouth daily at bedtime, may also take one tablet (0.5 mg) during the day as needed for anxiety.    . magnesium oxide (MAG-OX) 400 MG tablet Take 400 mg by mouth daily.    . nitroGLYCERIN (NITROSTAT) 0.4 MG SL tablet Place 1 tablet (0.4 mg total) under the tongue every 5 (five) minutes x 3 doses as needed for chest pain. 25 tablet 12  . pantoprazole (PROTONIX) 40 MG tablet Take 40 mg by mouth daily.    . rosuvastatin (CRESTOR) 40 MG tablet TAKE 1 TABLET AT BEDTIME 90 tablet 0  . sertraline (ZOLOFT) 50 MG tablet Take 50 mg by mouth at bedtime.     . metoprolol succinate (TOPROL-XL) 25 MG 24 hr tablet Take 12.5 mg by mouth daily.    Marland Kitchen levothyroxine (SYNTHROID) 75 MCG tablet Take 75 mcg by mouth daily.     No facility-administered medications prior to visit.     Allergies:   Codeine, Erythromycin, Levaquin [levofloxacin], and Tramadol   Social History   Socioeconomic History  .  Marital status: Married    Spouse name: Not on file  . Number of children: 4  . Years of education: Not on file  . Highest education level: Not on file  Occupational History  . Occupation: house wife   . Occupation: retired    Fish farm manager: [employer]  Tobacco Use  . Smoking status: Never Smoker  . Smokeless tobacco: Never Used  Vaping Use  . Vaping Use: Never used  Substance and Sexual Activity  . Alcohol use: No  . Drug use: No  . Sexual activity: Not Currently  Other Topics Concern  . Not on file  Social History Narrative   ** Merged History Encounter **       Social Determinants of Health   Financial Resource Strain: Not on file  Food Insecurity: Not on file  Transportation Needs: Not on file  Physical Activity: Not on file  Stress: Not on file  Social Connections: Not on file     Family History:  The patient's family history includes COPD in her father; CVA in her mother; Coronary artery disease in her father; Diabetes Mellitus II in her maternal grandmother and mother; Heart disease in her mother; Hyperlipidemia in her sister; Hypertension in her father, mother, sister, and sister; Uterine cancer in her sister.   ROS General: Negative; No fevers, chills, or night sweats;  HEENT: Legally blind, no changes in hearing, sinus congestion, difficulty swallowing Pulmonary: Negative; No cough, wheezing, shortness of breath, hemoptysis Cardiovascular: Negative; No chest pain, presyncope, syncope, palpitations GI: Negative; No nausea, vomiting, diarrhea, or abdominal pain GU: Negative; No dysuria, hematuria, or difficulty voiding Musculoskeletal: Negative; no myalgias, joint pain, or weakness Hematologic/Oncology: Recent abdominal wall hematoma secondary to blunt trauma while on aspirin/Brilinta. Endocrine: Negative; no heat/cold intolerance; no diabetes Neuro: Negative; no changes in balance, headaches Skin: Negative; No rashes or skin lesions Psychiatric:  Negative; No behavioral problems, depression Sleep: Negative; No snoring, daytime sleepiness, hypersomnolence, bruxism, restless legs, hypnogognic hallucinations, no cataplexy Other comprehensive 14 point system review is negative.   PHYSICAL EXAM:   VS:  BP 138/90  Pulse 70   Ht $R'5\' 1"'hF$  (1.549 m)   Wt 122 lb (55.3 kg)   SpO2 97%   BMI 23.05 kg/m     Repeat blood pressure by me was 134/86.  Wt Readings from Last 3 Encounters:  09/11/20 122 lb (55.3 kg)  06/11/20 120 lb 3.2 oz (54.5 kg)  05/13/20 124 lb (56.2 kg)    General: Alert, oriented, no distress.  Skin: normal turgor, no rashes, warm and dry HEENT: Normocephalic, atraumatic.  Nose without nasal septal hypertrophy Mouth/Parynx benign;  Neck: No JVD, no carotid bruits; normal carotid upstroke Lungs: clear to ausculatation and percussion; no wheezing or rales Chest wall: without tenderness to palpitation Heart: PMI not displaced, RRR, s1 s2 normal, 1/6 systolic murmur, no diastolic murmur, no rubs, gallops, thrills, or heaves Abdomen: Residual area of left residual firmness at the site of where her abdomen hit the corner of the countertop; no hepatosplenomehaly, BS+; abdominal aorta nontender and not dilated by palpation. Back: no CVA tenderness Pulses 2+ Musculoskeletal: full range of motion, normal strength, no joint deformities Extremities: Left hand ganglion cyst;  no clubbing cyanosis or edema, Homan's sign negative  Neurologic: grossly nonfocal; Cranial nerves grossly wnl Psychologic: Normal mood and affect   Studies/Labs Reviewed:   EKG:  EKG is ordered today.  ECG (independently read by me): NSR at 70, no ectopy  October 2021 ECG (independently read by me): NSR at 64, no ST changes, no ectopy, normal intervals  Recent Labs: BMP Latest Ref Rng & Units 05/16/2020 05/15/2020 05/14/2020  Glucose 70 - 99 mg/dL 148(H) 145(H) 236(H)  BUN 8 - 23 mg/dL 23 27(H) 32(H)  Creatinine 0.44 - 1.00 mg/dL 2.07(H) 2.05(H)  2.07(H)  Sodium 135 - 145 mmol/L 137 136 133(L)  Potassium 3.5 - 5.1 mmol/L 4.4 4.3 5.7(H)  Chloride 98 - 111 mmol/L 106 106 104  CO2 22 - 32 mmol/L 22 23 21(L)  Calcium 8.9 - 10.3 mg/dL 8.7(L) 8.5(L) 7.8(L)     Hepatic Function Latest Ref Rng & Units 05/13/2020 12/30/2019  Total Protein 6.5 - 8.1 g/dL 5.9(L) 6.4(L)  Albumin 3.5 - 5.0 g/dL 3.4(L) 3.5  AST 15 - 41 U/L 43(H) 53(H)  ALT 0 - 44 U/L 54(H) 51(H)  Alk Phosphatase 38 - 126 U/L 48 31(L)  Total Bilirubin 0.3 - 1.2 mg/dL 0.6 0.4  Bilirubin, Direct 0.0 - 0.2 mg/dL - 0.1    CBC Latest Ref Rng & Units 05/16/2020 05/15/2020 05/15/2020  WBC 4.0 - 10.5 K/uL 9.9 11.3(H) 10.3  Hemoglobin 12.0 - 15.0 g/dL 8.3(L) 9.1(L) 7.0(L)  Hematocrit 36.0 - 46.0 % 24.9(L) 27.2(L) 21.3(L)  Platelets 150 - 400 K/uL 188 192 184   Lab Results  Component Value Date   MCV 94.0 05/16/2020   MCV 93.2 05/15/2020   MCV 95.5 05/15/2020   Lab Results  Component Value Date   TSH 0.203 (L) 12/30/2019   Lab Results  Component Value Date   HGBA1C 7.4 (H) 05/14/2020     BNP    Component Value Date/Time   BNP 15.6 09/21/2017 2244    ProBNP No results found for: PROBNP   Lipid Panel     Component Value Date/Time   CHOL 116 12/31/2019 0325   TRIG 216 (H) 12/31/2019 0325   HDL 26 (L) 12/31/2019 0325   CHOLHDL 4.5 12/31/2019 0325   VLDL 43 (H) 12/31/2019 0325   LDLCALC 47 12/31/2019 0325     RADIOLOGY: No results found.   Additional studies/ records that were  reviewed today include:   Prox LAD lesion is 30% stenosed.  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  Post intervention, there is a 5% residual stenosis.   Mild nonobstructive stenosis of 30% in the proximal LAD in the region of the first diagonal and septal perforating artery.  Normal ramus intermediate and left circumflex coronary arteries.  Dominant RCA with total mid distal occlusion of the PDA vessel.  LVEDP 22 mmHg.  RECOMMENDATION: DAPT initially but since no stenting  was performed duration may be less than 1 year.  Will DC bivalirudin several hours post procedure.  Hydrate.  Aggressive lipid-lowering therapy with target LDL less than 70 and optimal blood pressure control.    Intervention     ASSESSMENT:    1. Essential hypertension   2. ST elevation myocardial infarction involving right coronary artery ([place]): 12/30/2019, PTCA to PDA   3. Abdominal wall hematoma, sequela   4. Hyperlipidemia LDL goal <70   5. Type 2 diabetes mellitus with complication, without long-term current use of insulin ([place])   6. Hypothyroidism, unspecified type     PLAN:  Jessica [name] is a 70 year old female who developed an acute coronary syndrome on December 30, 2019 and was found to have total mid distal occlusion of the PDA branch of her right coronary artery.  Due to the vessel caliber, stenting was not done but she underwent successful PTCA.  When I saw her for my initial evaluation with me in October 2021 he was continuing to feel well was on a regimen consisting of  isosorbide at 15 mg in addition to carvedilol which is supposed to be 6.25 mg twice a day but apparently she has only been taking often as a daily dose.  During that evaluation I recommended changing carvedilol to metoprolol succinate and started her at low-dose of 12.5 mg.  She was doing well on aspirin/Brilinta until she had hard blunt trauma to her lower left abdominal wall when she hit a countertop.  This resulted in development of a significant abdominal wall hematoma and she required 2 units of packed red blood cell transfusion.  Her hemoglobin is now stabilized.  She has been off Brilinta since that time particularly since she had not been stented.  Her blood pressure today is mildly elevated.  I have recommended slight titration of metoprolol to 25 mg daily.  She will continue with her current regimen of low-dose isosorbide.  She continues to be on aspirin alone and is tolerating this well.  Her hemoglobin has  risen to 13.2.  Her primary physician Dr. Brigitte Pulse recheck laboratory in January 2022.  LDL was excellent at 29.  She is diabetic on glipizide in addition to [place].  She is on levothyroxine for hypothyroidism and pantoprazole for GERD.  She has issues with the left ganglion cyst.  Cardiac wise she is doing fairly well.  I will see her in 6 months for reevaluation or sooner as needed.     Medication Adjustments/Labs and Tests Ordered: Current medicines are reviewed at length with the patient today.  Concerns regarding medicines are outlined above.  Medication changes, Labs and Tests ordered today are listed in the Patient Instructions below. Patient Instructions  Medication Instructions:  Increase Metoprolol to 25 mg daily   *If you need a refill on your cardiac medications before your next appointment, please call your pharmacy*   Follow-Up: At [facility], you and your health needs are our priority.  As part of our continuing mission to provide you  with exceptional heart care, we have created designated Provider Care Teams.  These Care Teams include your primary Cardiologist (physician) and Advanced Practice Providers (APPs -  Physician Assistants and Nurse Practitioners) who all work together to provide you with the care you need, when you need it.  We recommend signing up for the patient portal called "MyChart".  Sign up information is provided on this After Visit Summary.  MyChart is used to connect with patients for Virtual Visits (Telemedicine).  Patients are able to view lab/test results, encounter notes, upcoming appointments, etc.  Non-urgent messages can be sent to your provider as well.   To learn more about what you can do with MyChart, go to [email or web address].    Your next appointment:   6 month(s)  The format for your next appointment:   In Person  Provider:   Shelva Majestic, MD        Signed, Shelva Majestic, MD  09/13/2020 10:57 AM    [facility] [address], [place], [place], [place]  [address] Phone: [phone]

## 2020-09-11 NOTE — Patient Instructions (Signed)
Medication Instructions:  Increase Metoprolol to 25 mg daily   *If you need a refill on your cardiac medications before your next appointment, please call your pharmacy*   Follow-Up: At [facility], you and your health needs are our priority.  As part of our continuing mission to provide you with exceptional heart care, we have created designated Provider Care Teams.  These Care Teams include your primary Cardiologist (physician) and Advanced Practice Providers (APPs -  Physician Assistants and Nurse Practitioners) who all work together to provide you with the care you need, when you need it.  We recommend signing up for the patient portal called "MyChart".  Sign up information is provided on this After Visit Summary.  MyChart is used to connect with patients for Virtual Visits (Telemedicine).  Patients are able to view lab/test results, encounter notes, upcoming appointments, etc.  Non-urgent messages can be sent to your provider as well.   To learn more about what you can do with MyChart, go to [email or web address].    Your next appointment:   6 month(s)  The format for your next appointment:   In Person  Provider:   Shelva Majestic, MD

## 2020-09-13 ENCOUNTER — Encounter: Payer: Self-pay | Admitting: Cardiovascular Disease

## 2020-09-24 ENCOUNTER — Other Ambulatory Visit: Payer: Self-pay | Admitting: Cardiovascular Disease

## 2020-10-14 DIAGNOSIS — I213 ST elevation (STEMI) myocardial infarction of unspecified site: Secondary | ICD-10-CM | POA: Diagnosis not present

## 2020-10-14 DIAGNOSIS — E785 Hyperlipidemia, unspecified: Secondary | ICD-10-CM | POA: Diagnosis not present

## 2020-10-14 DIAGNOSIS — N184 Chronic kidney disease, stage 4 (severe): Secondary | ICD-10-CM | POA: Diagnosis not present

## 2020-10-14 DIAGNOSIS — E1122 Type 2 diabetes mellitus with diabetic chronic kidney disease: Secondary | ICD-10-CM | POA: Diagnosis not present

## 2020-10-14 DIAGNOSIS — D631 Anemia in chronic kidney disease: Secondary | ICD-10-CM | POA: Diagnosis not present

## 2020-10-14 DIAGNOSIS — I129 Hypertensive chronic kidney disease with stage 1 through stage 4 chronic kidney disease, or unspecified chronic kidney disease: Secondary | ICD-10-CM | POA: Diagnosis not present

## 2021-01-09 DIAGNOSIS — E079 Disorder of thyroid, unspecified: Secondary | ICD-10-CM | POA: Diagnosis not present

## 2021-01-09 DIAGNOSIS — M25532 Pain in left wrist: Secondary | ICD-10-CM | POA: Diagnosis not present

## 2021-01-09 DIAGNOSIS — M19032 Primary osteoarthritis, left wrist: Secondary | ICD-10-CM | POA: Diagnosis not present

## 2021-01-09 DIAGNOSIS — Z885 Allergy status to narcotic agent status: Secondary | ICD-10-CM | POA: Diagnosis not present

## 2021-01-09 DIAGNOSIS — Z7984 Long term (current) use of oral hypoglycemic drugs: Secondary | ICD-10-CM | POA: Diagnosis not present

## 2021-01-09 DIAGNOSIS — J45909 Unspecified asthma, uncomplicated: Secondary | ICD-10-CM | POA: Diagnosis not present

## 2021-01-09 DIAGNOSIS — Z7982 Long term (current) use of aspirin: Secondary | ICD-10-CM | POA: Diagnosis not present

## 2021-01-09 DIAGNOSIS — Z79899 Other long term (current) drug therapy: Secondary | ICD-10-CM | POA: Diagnosis not present

## 2021-01-09 DIAGNOSIS — M7989 Other specified soft tissue disorders: Secondary | ICD-10-CM | POA: Diagnosis not present

## 2021-01-09 DIAGNOSIS — M25512 Pain in left shoulder: Secondary | ICD-10-CM | POA: Diagnosis not present

## 2021-01-09 DIAGNOSIS — E119 Type 2 diabetes mellitus without complications: Secondary | ICD-10-CM | POA: Diagnosis not present

## 2021-01-09 DIAGNOSIS — Z881 Allergy status to other antibiotic agents status: Secondary | ICD-10-CM | POA: Diagnosis not present

## 2021-01-19 DIAGNOSIS — N184 Chronic kidney disease, stage 4 (severe): Secondary | ICD-10-CM | POA: Diagnosis not present

## 2021-01-19 DIAGNOSIS — I2111 ST elevation (STEMI) myocardial infarction involving right coronary artery: Secondary | ICD-10-CM | POA: Diagnosis not present

## 2021-01-19 DIAGNOSIS — E785 Hyperlipidemia, unspecified: Secondary | ICD-10-CM | POA: Diagnosis not present

## 2021-01-19 DIAGNOSIS — I129 Hypertensive chronic kidney disease with stage 1 through stage 4 chronic kidney disease, or unspecified chronic kidney disease: Secondary | ICD-10-CM | POA: Diagnosis not present

## 2021-01-19 DIAGNOSIS — M25512 Pain in left shoulder: Secondary | ICD-10-CM | POA: Diagnosis not present

## 2021-01-28 ENCOUNTER — Other Ambulatory Visit: Payer: Self-pay | Admitting: Cardiovascular Disease

## 2021-02-03 DIAGNOSIS — I129 Hypertensive chronic kidney disease with stage 1 through stage 4 chronic kidney disease, or unspecified chronic kidney disease: Secondary | ICD-10-CM | POA: Diagnosis not present

## 2021-02-03 DIAGNOSIS — I700 Atherosclerosis of aorta: Secondary | ICD-10-CM | POA: Diagnosis not present

## 2021-02-03 DIAGNOSIS — E1129 Type 2 diabetes mellitus with other diabetic kidney complication: Secondary | ICD-10-CM | POA: Diagnosis not present

## 2021-02-03 DIAGNOSIS — M8589 Other specified disorders of bone density and structure, multiple sites: Secondary | ICD-10-CM | POA: Diagnosis not present

## 2021-02-03 DIAGNOSIS — D692 Other nonthrombocytopenic purpura: Secondary | ICD-10-CM | POA: Diagnosis not present

## 2021-02-03 DIAGNOSIS — I2510 Atherosclerotic heart disease of native coronary artery without angina pectoris: Secondary | ICD-10-CM | POA: Diagnosis not present

## 2021-02-03 DIAGNOSIS — E039 Hypothyroidism, unspecified: Secondary | ICD-10-CM | POA: Diagnosis not present

## 2021-02-03 DIAGNOSIS — N184 Chronic kidney disease, stage 4 (severe): Secondary | ICD-10-CM | POA: Diagnosis not present

## 2021-02-03 DIAGNOSIS — M8580 Other specified disorders of bone density and structure, unspecified site: Secondary | ICD-10-CM | POA: Diagnosis not present

## 2021-02-03 DIAGNOSIS — I252 Old myocardial infarction: Secondary | ICD-10-CM | POA: Diagnosis not present

## 2021-02-03 DIAGNOSIS — E785 Hyperlipidemia, unspecified: Secondary | ICD-10-CM | POA: Diagnosis not present

## 2021-03-01 ENCOUNTER — Encounter (HOSPITAL_COMMUNITY): Payer: Self-pay | Admitting: Emergency Medicine

## 2021-03-01 ENCOUNTER — Other Ambulatory Visit: Payer: Self-pay

## 2021-03-01 ENCOUNTER — Emergency Department (HOSPITAL_COMMUNITY): Payer: Medicare HMO

## 2021-03-01 ENCOUNTER — Telehealth: Payer: Self-pay | Admitting: Physician Assistant

## 2021-03-01 ENCOUNTER — Emergency Department (HOSPITAL_COMMUNITY)
Admission: EM | Admit: 2021-03-01 | Discharge: 2021-03-02 | Disposition: A | Discharge status: Home / Self Care | Payer: Medicare HMO | Attending: Emergency Medicine | Admitting: Emergency Medicine

## 2021-03-01 DIAGNOSIS — R202 Paresthesia of skin: Secondary | ICD-10-CM | POA: Diagnosis not present

## 2021-03-01 DIAGNOSIS — R519 Headache, unspecified: Secondary | ICD-10-CM | POA: Diagnosis not present

## 2021-03-01 DIAGNOSIS — Z7984 Long term (current) use of oral hypoglycemic drugs: Secondary | ICD-10-CM | POA: Insufficient documentation

## 2021-03-01 DIAGNOSIS — E039 Hypothyroidism, unspecified: Secondary | ICD-10-CM | POA: Insufficient documentation

## 2021-03-01 DIAGNOSIS — N183 Chronic kidney disease, stage 3 unspecified: Secondary | ICD-10-CM | POA: Insufficient documentation

## 2021-03-01 DIAGNOSIS — N289 Disorder of kidney and ureter, unspecified: Secondary | ICD-10-CM | POA: Diagnosis not present

## 2021-03-01 DIAGNOSIS — R4781 Slurred speech: Secondary | ICD-10-CM | POA: Diagnosis not present

## 2021-03-01 DIAGNOSIS — E1122 Type 2 diabetes mellitus with diabetic chronic kidney disease: Secondary | ICD-10-CM | POA: Insufficient documentation

## 2021-03-01 DIAGNOSIS — I129 Hypertensive chronic kidney disease with stage 1 through stage 4 chronic kidney disease, or unspecified chronic kidney disease: Secondary | ICD-10-CM | POA: Diagnosis not present

## 2021-03-01 DIAGNOSIS — Z79899 Other long term (current) drug therapy: Secondary | ICD-10-CM | POA: Insufficient documentation

## 2021-03-01 DIAGNOSIS — I10 Essential (primary) hypertension: Secondary | ICD-10-CM | POA: Diagnosis not present

## 2021-03-01 DIAGNOSIS — Y900 Blood alcohol level of less than 20 mg/100 ml: Secondary | ICD-10-CM | POA: Insufficient documentation

## 2021-03-01 DIAGNOSIS — Z20822 Contact with and (suspected) exposure to covid-19: Secondary | ICD-10-CM | POA: Insufficient documentation

## 2021-03-01 DIAGNOSIS — D649 Anemia, unspecified: Secondary | ICD-10-CM | POA: Diagnosis not present

## 2021-03-01 DIAGNOSIS — G43909 Migraine, unspecified, not intractable, without status migrainosus: Secondary | ICD-10-CM | POA: Diagnosis not present

## 2021-03-01 DIAGNOSIS — Z7982 Long term (current) use of aspirin: Secondary | ICD-10-CM | POA: Insufficient documentation

## 2021-03-01 DIAGNOSIS — E871 Hypo-osmolality and hyponatremia: Secondary | ICD-10-CM

## 2021-03-01 DIAGNOSIS — G43009 Migraine without aura, not intractable, without status migrainosus: Secondary | ICD-10-CM

## 2021-03-01 DIAGNOSIS — Z8673 Personal history of transient ischemic attack (TIA), and cerebral infarction without residual deficits: Secondary | ICD-10-CM | POA: Diagnosis not present

## 2021-03-01 LAB — APTT: aPTT: 30 seconds (ref 24–36)

## 2021-03-01 LAB — COMPREHENSIVE METABOLIC PANEL
ALT: 50 U/L — ABNORMAL HIGH (ref 0–44)
AST: 38 U/L (ref 15–41)
Albumin: 3.9 g/dL (ref 3.5–5.0)
Alkaline Phosphatase: 62 U/L (ref 38–126)
Anion gap: 10 (ref 5–15)
BUN: 23 mg/dL (ref 8–23)
CO2: 21 mmol/L — ABNORMAL LOW (ref 22–32)
Calcium: 9.2 mg/dL (ref 8.9–10.3)
Chloride: 97 mmol/L — ABNORMAL LOW (ref 98–111)
Creatinine, Ser: 1.91 mg/dL — ABNORMAL HIGH (ref 0.44–1.00)
GFR, Estimated: 28 mL/min — ABNORMAL LOW (ref >60)
Glucose, Bld: 125 mg/dL — ABNORMAL HIGH (ref 70–99)
Potassium: 4.4 mmol/L (ref 3.5–5.1)
Sodium: 128 mmol/L — ABNORMAL LOW (ref 135–145)
Total Bilirubin: 0.5 mg/dL (ref 0.3–1.2)
Total Protein: 6.6 g/dL (ref 6.5–8.1)

## 2021-03-01 LAB — CBC
HCT: 34.8 % — ABNORMAL LOW (ref 36.0–46.0)
Hemoglobin: 11.5 g/dL — ABNORMAL LOW (ref 12.0–15.0)
MCH: 30.7 pg (ref 26.0–34.0)
MCHC: 33 g/dL (ref 30.0–36.0)
MCV: 93 fL (ref 80.0–100.0)
Platelets: 259 10*3/uL (ref 150–400)
RBC: 3.74 MIL/uL — ABNORMAL LOW (ref 3.87–5.11)
RDW: 12.6 % (ref 11.5–15.5)
WBC: 6.9 10*3/uL (ref 4.0–10.5)
nRBC: 0 % (ref 0.0–0.2)

## 2021-03-01 LAB — I-STAT CHEM 8, ED
BUN: 23 mg/dL (ref 8–23)
Calcium, Ion: 1.1 mmol/L — ABNORMAL LOW (ref 1.15–1.40)
Chloride: 98 mmol/L (ref 98–111)
Creatinine, Ser: 1.8 mg/dL — ABNORMAL HIGH (ref 0.44–1.00)
Glucose, Bld: 125 mg/dL — ABNORMAL HIGH (ref 70–99)
HCT: 35 % — ABNORMAL LOW (ref 36.0–46.0)
Hemoglobin: 11.9 g/dL — ABNORMAL LOW (ref 12.0–15.0)
Potassium: 4.4 mmol/L (ref 3.5–5.1)
Sodium: 130 mmol/L — ABNORMAL LOW (ref 135–145)
TCO2: 23 mmol/L (ref 22–32)

## 2021-03-01 LAB — DIFFERENTIAL
Abs Immature Granulocytes: 0.02 10*3/uL (ref 0.00–0.07)
Basophils Absolute: 0 10*3/uL (ref 0.0–0.1)
Basophils Relative: 1 %
Eosinophils Absolute: 0.2 10*3/uL (ref 0.0–0.5)
Eosinophils Relative: 3 %
Immature Granulocytes: 0 %
Lymphocytes Relative: 28 %
Lymphs Abs: 2 10*3/uL (ref 0.7–4.0)
Monocytes Absolute: 0.8 10*3/uL (ref 0.1–1.0)
Monocytes Relative: 12 %
Neutro Abs: 3.9 10*3/uL (ref 1.7–7.7)
Neutrophils Relative %: 56 %

## 2021-03-01 LAB — TROPONIN I (HIGH SENSITIVITY): Troponin I (High Sensitivity): 7 ng/L (ref <18)

## 2021-03-01 LAB — ETHANOL: Alcohol, Ethyl (B): <10 mg/dL (ref <10)

## 2021-03-01 LAB — PROTIME-INR
INR: 1 (ref 0.8–1.2)
Prothrombin Time: 12.8 seconds (ref 11.4–15.2)

## 2021-03-01 MED ORDER — PROCHLORPERAZINE EDISYLATE 10 MG/2ML IJ SOLN
10.0000 mg | Freq: Once | INTRAMUSCULAR | Status: AC
  Administered 2021-03-02: 10 mg via INTRAVENOUS
  Filled 2021-03-01: qty 2

## 2021-03-01 MED ORDER — SODIUM CHLORIDE 0.9% FLUSH
3.0000 mL | Freq: Once | INTRAVENOUS | Status: DC
Start: 2021-03-01 — End: 2021-03-02

## 2021-03-01 MED ORDER — LACTATED RINGERS IV BOLUS
1000.0000 mL | Freq: Once | INTRAVENOUS | Status: AC
  Administered 2021-03-02: 1000 mL via INTRAVENOUS

## 2021-03-01 MED ORDER — DIPHENHYDRAMINE HCL 50 MG/ML IJ SOLN
25.0000 mg | Freq: Once | INTRAMUSCULAR | Status: AC
  Administered 2021-03-02: 25 mg via INTRAVENOUS
  Filled 2021-03-01: qty 1

## 2021-03-01 NOTE — ED Provider Notes (Signed)
[facility] EMERGENCY DEPARTMENT Provider Note   CSN: [id number] Arrival date & time: 03/01/21  1810     History Chief Complaint  Patient presents with   Aphasia   Headache    Jessica [name] is a 70 y.o. female.  The history is provided by the patient.  Headache She has history of hypertension, diabetes, hyperlipidemia, coronary artery disease, chronic kidney disease, blindness and comes in following an episode of facial numbness and slurred speech.  She states that the numbness was on the left side of her face and only lasted 5-10 seconds.  Slurred speech only lasts a similar amount of time.  Following this, she developed a headache which was bifrontal and now is just on the left side.  She headache is dull and throbbing and was originally rated at 9/10.  Headache is improved to where it is now 6/10.  There is was associated nausea.  She did not have any visual symptoms (baseline blindness).  She denied any weakness or numbness in her arms or legs.  She has not had similar symptoms before.  She checked her blood pressure at home and it was elevated to 180/90.  She has not taken anything at home for any of her symptoms.  She has not noticed anything making her headache better or worse.   Past Medical History:  Diagnosis Date   Anemia    Anxiety    CKD (chronic kidney disease)    CKD III per PCP notes-pt denies   Congenital blindness    Diabetes mellitus    Diverticula of small intestine    Esophageal stricture    GERD (gastroesophageal reflux disease)    H/O hiatal hernia    Hyperlipidemia    Hypertension    Hypothyroidism    Iron deficiency anemia    Low magnesium level    Low sodium levels    Myocardial infarct (HCC)    Osteopenia    Osteoporosis    Postoperative nausea and vomiting    Renal disease    Retinitis    STEMI (ST elevation myocardial infarction) (HCC)    SVT (supraventricular tachycardia) (HCC)    Vitamin B 12 deficiency     Patient  Active Problem List   Diagnosis Date Noted   Abdominal wall hematoma 05/13/2020   Hypertension 01/14/2020   CKD (chronic kidney disease), stage III ([place]) 01/14/2020   Anemia 01/14/2020   Anxiety 01/14/2020   Chest pain 01/13/2020   Acute ST elevation myocardial infarction (STEMI) ([place]) 12/30/2019   STEMI (ST elevation myocardial infarction) ([place]) 12/30/2019   STEMI involving oth coronary artery of inferior wall ([place]) 12/30/2019   Dysphagia 12/06/2017   History of esophageal stricture 12/06/2017   Nausea with vomiting 05/21/2013   HYPOTHYROIDISM 11/15/2007   Type 2 diabetes mellitus with complication, without long-term current use of insulin ([place]) 11/15/2007   Hyperlipidemia LDL goal <70 11/15/2007   VISUAL IMPAIRMENT 11/15/2007   ESOPHAGEAL STRICTURE 02/08/2007   GASTROESOPHAGEAL REFLUX DISEASE 02/08/2007   HIATAL HERNIA 02/08/2007    Past Surgical History:  Procedure Laterality Date   BACK SURGERY     CARPAL TUNNEL RELEASE     Right    CESAREAN SECTION     CHOLECYSTECTOMY  1985   COLONOSCOPY     CORONARY BALLOON ANGIOPLASTY N/A 12/30/2019   Procedure: CORONARY BALLOON ANGIOPLASTY;  Surgeon: Troy Sine, MD;  Location: [place] [facility];  Service: Cardiovascular;  Laterality: N/A;   CORONARY/GRAFT ACUTE MI REVASCULARIZATION N/A  12/30/2019   Procedure: Coronary/Graft Acute MI Revascularization;  Surgeon: Troy Sine, MD;  Location: [place] [facility];  Service: Cardiovascular;  Laterality: N/A;   HERNIA REPAIR     LEFT HEART CATH AND CORONARY ANGIOGRAPHY N/A 12/30/2019   Procedure: LEFT HEART CATH AND CORONARY ANGIOGRAPHY;  Surgeon: Troy Sine, MD;  Location: [place] [facility];  Service: Cardiovascular;  Laterality: N/A;   Right knee arthroscopy  1993   Right thyroidectomy  2007   TOTAL ABDOMINAL HYSTERECTOMY W/ BILATERAL SALPINGOOPHORECTOMY  1993   UPPER GASTROINTESTINAL ENDOSCOPY       OB History   No obstetric history on file.     Family History  Problem  Relation Age of Onset   Coronary artery disease Father    COPD Father    CVA Mother    Diabetes Mellitus II Mother    Heart disease Mother    Diabetes Mellitus II Maternal Grandmother    Uterine cancer Sister    Hypertension Sister    Hypertension Sister        Brother also   Hyperlipidemia Sister        Brother also   Colon cancer Neg Hx    Rectal cancer Neg Hx    Stomach cancer Neg Hx    Esophageal cancer Neg Hx    Hypertension Mother    Hypertension Father     Social History   Tobacco Use   Smoking status: Never   Smokeless tobacco: Never  Vaping Use   Vaping Use: Never used  Substance Use Topics   Alcohol use: No   Drug use: No    Home Medications Prior to Admission medications   Medication Sig Start Date End Date Taking? Authorizing Provider  acetaminophen (TYLENOL) 325 MG tablet Take 2 tablets (650 mg total) by mouth every 4 (four) hours as needed for mild pain. 05/16/20   Saverio Danker, PA-C  aspirin EC 81 MG tablet Take 1 tablet (81 mg total) by mouth daily. Swallow whole. 06/27/20   Darreld Mclean, PA-C  Coenzyme Q10 (CO Q 10) 100 MG CAPS Take 100 mg by mouth daily.     [provider]  cyanocobalamin (,VITAMIN B-12,) 1000 MCG/ML injection Inject 1,000 mcg into the muscle every 30 (thirty) days. 11/02/19   [provider]  empagliflozin (JARDIANCE) 10 MG TABS tablet Take 10 mg by mouth daily.    [provider]  famotidine (PEPCID) 20 MG tablet Take 20 mg by mouth 2 (two) times daily.    [provider]  fluticasone (FLONASE) 50 MCG/ACT nasal spray Place 1 spray into both nostrils daily as needed for allergies.  01/15/20   [provider]  glipiZIDE (GLUCOTROL) 10 MG tablet Take 10 mg by mouth daily.  05/05/20   [provider]  isosorbide dinitrate (ISORDIL) 30 MG tablet Take by mouth as directed. TAKE 1/2 TABLET DAILY    [provider]  levothyroxine (SYNTHROID) 50 MCG tablet Take 50 mcg by mouth  daily before breakfast. 1 Tablet Daily Before Breakfast    [provider]  LORazepam (ATIVAN) 0.5 MG tablet Take 0.5 mg by mouth See admin instructions. Take one tablet (0.5 mg) by mouth daily at bedtime, may also take one tablet (0.5 mg) during the day as needed for anxiety.    [provider]  magnesium oxide (MAG-OX) 400 MG tablet Take 400 mg by mouth daily.    [provider]  metoprolol succinate (TOPROL-XL) 25 MG 24  hr tablet TAKE 1 TABLET EVERY DAY 01/29/21   Troy Sine, MD  nitroGLYCERIN (NITROSTAT) 0.4 MG SL tablet Place 1 tablet (0.4 mg total) under the tongue every 5 (five) minutes x 3 doses as needed for chest pain. 01/01/20   Bhagat, Crista Luria, PA  pantoprazole (PROTONIX) 40 MG tablet Take 40 mg by mouth daily. 03/22/20   [provider]  rosuvastatin (CRESTOR) 40 MG tablet TAKE 1 TABLET AT BEDTIME 09/24/20   Troy Sine, MD  sertraline (ZOLOFT) 50 MG tablet Take 50 mg by mouth at bedtime.  03/06/20   [provider]    Allergies    Codeine, Erythromycin, Levaquin [levofloxacin], and Tramadol  Review of Systems   Review of Systems  Neurological:  Positive for headaches.  All other systems reviewed and are negative.  Physical Exam Updated Vital Signs BP (!) 142/87 (BP Location: Left Arm)   Pulse 73   Temp 98 F (36.7 C) (Oral)   Resp 18   SpO2 97%   Physical Exam Vitals and nursing note reviewed.  70 year old female, resting comfortably and in no acute distress. Vital signs are significant for borderline elevated blood pressure. Oxygen saturation is 97%, which is normal. Head is normocephalic and atraumatic.  Corneas are opacified, EOMI. Oropharynx is clear. Neck is nontender and supple without adenopathy or JVD.  There are no carotid bruits. Back is nontender and there is no CVA tenderness. Lungs are clear without rales, wheezes, or rhonchi. Chest is nontender. Heart has regular rate and rhythm without murmur. Abdomen  is soft, flat, nontender without masses or hepatosplenomegaly and peristalsis is normoactive. Extremities have no cyanosis or edema, full range of motion is present. Skin is warm and dry without rash. Neurologic: Mental status is normal, speech is normal.  Cranial nerves are intact except for blindness.  There is no pronator drift.  Strength is 5/5 in both arms and both legs.  Sensation is normal and there is no extinction on double simultaneous stimulation.  ED Results / Procedures / Treatments   Labs (all labs ordered are listed, but only abnormal results are displayed) Labs Reviewed  CBC - Abnormal; Notable for the following components:      Result Value   RBC 3.74 (*)    Hemoglobin 11.5 (*)    HCT 34.8 (*)    All other components within normal limits  COMPREHENSIVE METABOLIC PANEL - Abnormal; Notable for the following components:   Sodium 128 (*)    Chloride 97 (*)    CO2 21 (*)    Glucose, Bld 125 (*)    Creatinine, Ser 1.91 (*)    ALT 50 (*)    GFR, Estimated 28 (*)    All other components within normal limits  I-STAT CHEM 8, ED - Abnormal; Notable for the following components:   Sodium 130 (*)    Creatinine, Ser 1.80 (*)    Glucose, Bld 125 (*)    Calcium, Ion 1.10 (*)    Hemoglobin 11.9 (*)    HCT 35.0 (*)    All other components within normal limits  RESP PANEL BY RT-PCR (FLU A&B, COVID) ARPGX2  PROTIME-INR  APTT  DIFFERENTIAL  ETHANOL  RAPID URINE DRUG SCREEN, HOSP PERFORMED  URINALYSIS, ROUTINE W REFLEX MICROSCOPIC  TROPONIN I (HIGH SENSITIVITY)  TROPONIN I (HIGH SENSITIVITY)    EKG None  Radiology DG Chest 1 View  Result Date: 03/01/2021 CLINICAL DATA:  Headache EXAM: CHEST  1 VIEW COMPARISON:  05/13/2020 FINDINGS:  Stable nodule in the lingula which measured 1.4 cm on prior CT. Right lung clear. Heart is normal size. No acute confluent opacities or effusions. IMPRESSION: No active cardiopulmonary disease. Electronically Signed   By: Rolm Baptise M.D.   On:  03/01/2021 19:12   CT HEAD WO CONTRAST  Result Date: 03/01/2021 CLINICAL DATA:  Left-sided numbness with slurred speech, initial encounter EXAM: CT HEAD WITHOUT CONTRAST TECHNIQUE: Contiguous axial images were obtained from the base of the skull through the vertex without intravenous contrast. COMPARISON:  05/14/2019 FINDINGS: Brain: There are changes consistent with mild atrophic change. Prior left occipital infarct is again noted and stable. Volume loss is noted in the right posterior parietal region consistent with prior infarct is well. These changes are stable from the prior exam. No acute hemorrhage, acute infarction or space-occupying mass lesion is noted. Vascular: No hyperdense vessel or unexpected calcification. Skull: Normal. Negative for fracture or focal lesion. Sinuses/Orbits: No acute finding. Other: None. 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 Electronically Signed   By: Inez Catalina M.D.   On: 03/01/2021 19:33   MR BRAIN WO CONTRAST  Result Date: 03/02/2021 CLINICAL DATA:  Transient ischemic attack EXAM: MRI HEAD WITHOUT CONTRAST TECHNIQUE: Multiplanar, multiecho pulse sequences of the brain and surrounding structures were obtained without intravenous contrast. COMPARISON:  None. FINDINGS: Brain: No acute infarct, mass effect or extra-axial collection. Subarachnoid blood over the posterior right hemisphere, chronic. Old infarcts of the occipital and right parietal. Mild generalized volume loss. The midline structures are normal. Vascular: Major flow voids are preserved. Skull and upper cervical spine: Normal calvarium and skull base. Visualized upper cervical spine and soft tissues are normal. Sinuses/Orbits:No paranasal sinus fluid levels or advanced mucosal thickening. No mastoid or middle ear effusion. Normal orbits. IMPRESSION: 1. No acute intracranial abnormality. 2. 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 3. Subarachnoid blood over the posterior  right hemisphere, chronic. Electronically Signed   By: Ulyses Jarred M.D.   On: 03/02/2021 02:01    Procedures Procedures   Medications Ordered in ED Medications  sodium chloride flush (NS) 0.9 % injection 3 mL (has no administration in time range)  lactated ringers bolus 1,000 mL (has no administration in time range)  prochlorperazine (COMPAZINE) injection 10 mg (has no administration in time range)  diphenhydrAMINE (BENADRYL) injection 25 mg (has no administration in time range)    ED Course  I have reviewed the triage vital signs and the nursing notes.  Pertinent labs & imaging results that were available during my care of the patient were reviewed by me and considered in my medical decision making (see chart for details).   MDM Rules/Calculators/A&P                         Transient slurred speech and facial numbness followed by headache.  This is strongly suggestive of migraine type headache, but could also represent transient ischemic attack.  She has significant history of atherosclerotic disease.  ED work-up thus far are significant for stable renal insufficiency, mild hyponatremia (not clinically significant), mild anemia (improved compared with baseline).  CT of head showed old infarcts, no acute abnormality.  Chest x-ray showed no active cardiopulmonary disease.  Old records are reviewed, she has no relevant past visits.  She will be given a headache cocktail and sent for MRI of the brain.  MRI of brain shows old infarcts, no acute infarct.  She had excellent relief of her headache  with the headache cocktail.  Given the extremely brief duration of the slurred speech and paresthesia in the temporal association with headache which is acting like a migraine, I do not feel she needs to be admitted for TIA work-up.  She may benefit from having a TIA work-up as an outpatient.  She is to follow-up with her primary care provider.  She is already on daily aspirin, no additional treatment is  needed.  She is advised to monitor her blood pressure at home and keep a record of it and take that with her when she sees her primary care provider.  Final Clinical Impression(s) / ED Diagnoses Final diagnoses:  Migraine without aura and without status migrainosus, not intractable  Facial paresthesia  Renal insufficiency  Hyponatremia  Normochromic normocytic anemia  Elevated blood pressure reading with diagnosis of hypertension    Rx / DC Orders ED Discharge Orders     None        Delora Fuel, MD 54/62/70 [phone]

## 2021-03-01 NOTE — ED Triage Notes (Signed)
Pt reports numbness to L side of face and slurred speech that started suddenly at 12:30pm and lasted for a few seconds and resolved.  Reports headache 9/10.  Denies history of headaches.  No arm Drift.  VAN negative.  Pt blind.

## 2021-03-01 NOTE — Telephone Encounter (Signed)
   Patient called because she did not feel well when she got home from church.  That time, her blood pressure was very high, 196/105  She had a bit of a headache, some blurring of her vision and felt weak.  She also felt some numbness on the left side of her face and felt that her speech was a little slurred.  Her metoprolol has been increased from 25 mg up to 50 mg by her PCP and she had taken that this morning.  She has Isordil 30 mg, 1/2 tablet daily to take as well.  She had not taken that yet as she holds it when her systolic blood pressure is below 110.  She went ahead and took the half tablet of the Isordil.  That was a little while ago.  Well she was on the phone, she got her husband to recheck her blood pressure.  It was 140/80 and her heart rate was in the 70s.  I recommended that if she had similar symptoms to this morning symptoms, she call 911 and come to the emergency room.  I also recommended that she check her blood pressure every day at lunchtime and take the Isordil if her blood pressure systolic is greater than 161.  She can work with her PCP on expanding the parameters if needed.  I asked again if she were having any symptoms right now, and she said no she feels a lot better.  Follow-up with cardiology as scheduled.  Rosaria Ferries, PA-C 03/01/2021 1:17 PM

## 2021-03-01 NOTE — ED Provider Notes (Signed)
Emergency Medicine Provider Triage Evaluation Note  Jessica [name] , a 70 y.o. female  was evaluated in triage.  Pt complains of left sided facial numbness and slurred speech that began at 12:30PM and lasted a few seconds. She now endorses a left-sided headache. Denies current numbness/tingling or speech changes. Patient is blind. She also admits to 1 episode of chest pain today. No previous CVA.  Review of Systems  Positive: numbness Negative: fever  Physical Exam  BP (!) 171/84 (BP Location: Left Arm)   Pulse 83   Temp 98 F (36.7 C) (Oral)   Resp 15   SpO2 99%  Gen:   Awake, no distress   Resp:  Normal effort  MSK:   Moves extremities without difficulty  Other:  No facial droop, normal speech, sensation intact, no drift, unable to assess visual fields due to blindness  Medical Decision Making  Medically screening exam initiated at 6:22 PM.  Appropriate orders placed.  DARIAN ACE was informed that the remainder of the evaluation will be completed by another provider, this initial triage assessment does not replace that evaluation, and the importance of remaining in the ED until their evaluation is complete.  Stroke labs ordered, No neurological deficits Troponin due to chest pain   Karie Kirks 03/01/21 Sheliah Hatch, MD 03/01/21 [phone]

## 2021-03-02 ENCOUNTER — Emergency Department (HOSPITAL_COMMUNITY): Payer: Medicare HMO

## 2021-03-02 DIAGNOSIS — Z8673 Personal history of transient ischemic attack (TIA), and cerebral infarction without residual deficits: Secondary | ICD-10-CM | POA: Diagnosis not present

## 2021-03-02 LAB — URINALYSIS, ROUTINE W REFLEX MICROSCOPIC
Bacteria, UA: NONE SEEN
Bilirubin Urine: NEGATIVE
Glucose, UA: 150 mg/dL — AB
Ketones, ur: NEGATIVE mg/dL
Leukocytes,Ua: NEGATIVE
Nitrite: NEGATIVE
Protein, ur: NEGATIVE mg/dL
Specific Gravity, Urine: 1.003 — ABNORMAL LOW (ref 1.005–1.030)
pH: 8 (ref 5.0–8.0)

## 2021-03-02 LAB — RAPID URINE DRUG SCREEN, HOSP PERFORMED
Amphetamines: NOT DETECTED
Barbiturates: NOT DETECTED
Benzodiazepines: NOT DETECTED
Cocaine: NOT DETECTED
Opiates: NOT DETECTED
Tetrahydrocannabinol: NOT DETECTED

## 2021-03-02 LAB — CBG MONITORING, ED: Glucose-Capillary: 66 mg/dL — ABNORMAL LOW (ref 70–99)

## 2021-03-02 LAB — RESP PANEL BY RT-PCR (FLU A&B, COVID) ARPGX2
Influenza A by PCR: NEGATIVE
Influenza B by PCR: NEGATIVE
SARS Coronavirus 2 by RT PCR: NEGATIVE

## 2021-03-02 LAB — TROPONIN I (HIGH SENSITIVITY): Troponin I (High Sensitivity): 8 ng/L (ref <18)

## 2021-03-02 NOTE — ED Notes (Signed)
DC instructions reviewed with pt and family.  Both verbalized understanding.  PT DC.

## 2021-03-02 NOTE — Discharge Instructions (Addendum)
Your evaluation today did not show any sign of a stroke.  I believe your symptoms were related to your headache.  Your blood pressure was initially fairly high.  Please check your blood pressure once a day and keep a record of it.  Take that record with you when you see either your primary care provider or your cardiologist.  Please follow-up with your primary care provider.  He may want to run some additional tests as an outpatient.  Return to the emergency department if you are having any new or concerning symptoms.

## 2021-03-20 DIAGNOSIS — N184 Chronic kidney disease, stage 4 (severe): Secondary | ICD-10-CM | POA: Diagnosis not present

## 2021-03-20 DIAGNOSIS — I129 Hypertensive chronic kidney disease with stage 1 through stage 4 chronic kidney disease, or unspecified chronic kidney disease: Secondary | ICD-10-CM | POA: Diagnosis not present

## 2021-03-20 DIAGNOSIS — M810 Age-related osteoporosis without current pathological fracture: Secondary | ICD-10-CM | POA: Diagnosis not present

## 2021-03-20 DIAGNOSIS — E1129 Type 2 diabetes mellitus with other diabetic kidney complication: Secondary | ICD-10-CM | POA: Diagnosis not present

## 2021-03-20 DIAGNOSIS — I2510 Atherosclerotic heart disease of native coronary artery without angina pectoris: Secondary | ICD-10-CM | POA: Diagnosis not present

## 2021-03-24 NOTE — Progress Notes (Signed)
Cardiology Office Note   Date:  03/25/2021   ID:  Jessica [name], Jessica [name] [birth year], MRN [id number]  PCP:  Ginger Organ., MD  Cardiologist:  Shelva Majestic, MD EP: None  Chief Complaint  Patient presents with   Follow-up    ER F/U-Slurred speech, headache and ^BP      History of Present Illness: Jessica [name] is a 70 y.o. female with a PMH of CAD s/p STEMI 12/2019 s/p balloon angioplasty to dPDA, HTN, HLD, DM type 2, hypothyroidism, CKD stage 3, who presents for routine follow-up.  She presented to the hospital 12/2019 with chest pain and was found to have a STEMI. She was taken emergently to the cath lab where she was found to have 100% RPDA stenosis managed with balloon angioplasty, otherwise 30% pLAD stenosis which was medically managed. She had an echocardiogram 12/2019 which showed EF 50-55%, mild LVH, moderate hypokinesis of inferiore wall, apical, and inferoseptal wall, G1DD, and no significant valvular abnormalities. She was started on aspirin and brilinta.  She was last evaluated by cardiology at an outpatient visit with Dr. Claiborne Billings 08/2020 at which time she was without anginal complaints. She had recently recovered from COVID-19 06/2020. She was noted to be off brilinta following blunt trauma to her abdomen resulting in significant abdominal wall hematoma requiring 2 uPRBC. Her metoprolol was increased to 25mg  daily for improved BP control and she was recommended to follow-up in 6 months.  Since her last visit she was seen in the ED 03/01/21 for a migraine and prior to this she was seen in the ED 01/09/21 for left shoulder pain.  She presents today for follow-up. Overall she has been doing fairly well from a cardiac standpoint. She saw her PCP several weeks ago and her metoprolol succinate was increased to 50mg  daily given SBPs in the 140s-160s at home. Bp has improved since then and is generally in the 120s-130s/80s. She has had rare episodes of BP in the 80s/50s. No complaints of  chest pain, SOB, DOE, orthopnea, PND, LE edema, dizziness, lightheadedness, or syncope. She asks about risk for possible carpal tunnel surgery which she had initially planned to do just prior to her MI last year. She is legally blind which limits her outdoor activity and access to gyms. Despite this she continues to be active at home and can easily complete 4 METs.      Past Medical History:  Diagnosis Date   Anemia    Anxiety    CKD (chronic kidney disease)    CKD III per PCP notes-pt denies   Congenital blindness    Diabetes mellitus    Diverticula of small intestine    Esophageal stricture    GERD (gastroesophageal reflux disease)    H/O hiatal hernia    Hyperlipidemia    Hypertension    Hypothyroidism    Iron deficiency anemia    Low magnesium level    Low sodium levels    Myocardial infarct (HCC)    Osteopenia    Osteoporosis    Postoperative nausea and vomiting    Renal disease    Retinitis    STEMI (ST elevation myocardial infarction) ([place])    SVT (supraventricular tachycardia) (HCC)    Vitamin B 12 deficiency     Past Surgical History:  Procedure Laterality Date   BACK SURGERY     CARPAL TUNNEL RELEASE     Right    CESAREAN SECTION     CHOLECYSTECTOMY  1985  COLONOSCOPY     CORONARY BALLOON ANGIOPLASTY N/A 12/30/2019   Procedure: CORONARY BALLOON ANGIOPLASTY;  Surgeon: Troy Sine, MD;  Location: [facility];  Service: Cardiovascular;  Laterality: N/A;   CORONARY/GRAFT ACUTE MI REVASCULARIZATION N/A 12/30/2019   Procedure: Coronary/Graft Acute MI Revascularization;  Surgeon: Troy Sine, MD;  Location: [facility];  Service: Cardiovascular;  Laterality: N/A;   HERNIA REPAIR     LEFT HEART CATH AND CORONARY ANGIOGRAPHY N/A 12/30/2019   Procedure: LEFT HEART CATH AND CORONARY ANGIOGRAPHY;  Surgeon: Troy Sine, MD;  Location: [facility];  Service: Cardiovascular;  Laterality: N/A;   Right knee arthroscopy  1993   Right thyroidectomy   2007   TOTAL ABDOMINAL HYSTERECTOMY W/ BILATERAL SALPINGOOPHORECTOMY  1993   UPPER GASTROINTESTINAL ENDOSCOPY       Current Outpatient Medications  Medication Sig Dispense Refill   acetaminophen (TYLENOL) 325 MG tablet Take 2 tablets (650 mg total) by mouth every 4 (four) hours as needed for mild pain.     aspirin EC 81 MG tablet Take 1 tablet (81 mg total) by mouth daily. Swallow whole. 30 tablet 11   Coenzyme Q10 (CO Q 10) 100 MG CAPS Take 100 mg by mouth daily.      cyanocobalamin (,VITAMIN B-12,) 1000 MCG/ML injection Inject 1,000 mcg into the muscle every 30 (thirty) days.     Dulaglutide (TRULICITY) 3.47 QQ/5.9DG SOPN inject 0.75mg      famotidine (PEPCID) 20 MG tablet Take 20 mg by mouth 2 (two) times daily.     fenofibrate (TRICOR) 145 MG tablet Take 1 tablet (145 mg total) by mouth daily. 30 tablet 6   fluticasone (FLONASE) 50 MCG/ACT nasal spray Place 1 spray into both nostrils daily as needed for allergies.      isosorbide dinitrate (ISORDIL) 30 MG tablet Take by mouth as directed. TAKE 1/2 TABLET DAILY     levothyroxine (SYNTHROID) 50 MCG tablet Take 50 mcg by mouth daily before breakfast. 1 Tablet Daily Before Breakfast     LORazepam (ATIVAN) 0.5 MG tablet Take 0.5 mg by mouth See admin instructions. Take one tablet (0.5 mg) by mouth daily at bedtime, may also take one tablet (0.5 mg) during the day as needed for anxiety.     magnesium oxide (MAG-OX) 400 MG tablet Take 400 mg by mouth daily.     nitroGLYCERIN (NITROSTAT) 0.4 MG SL tablet Place 1 tablet (0.4 mg total) under the tongue every 5 (five) minutes x 3 doses as needed for chest pain. 25 tablet 12   pantoprazole (PROTONIX) 40 MG tablet Take 40 mg by mouth daily.     rosuvastatin (CRESTOR) 40 MG tablet TAKE 1 TABLET AT BEDTIME 90 tablet 3   sertraline (ZOLOFT) 50 MG tablet Take 50 mg by mouth at bedtime.      empagliflozin (JARDIANCE) 10 MG TABS tablet Take 1 tablet (10 mg total) by mouth daily. 30 tablet 6   metoprolol  succinate (TOPROL-XL) 25 MG 24 hr tablet Take 2 tablets (50 mg total) by mouth daily. 30 tablet 6   No current facility-administered medications for this visit.    Allergies:   Codeine, Erythromycin, Levaquin [levofloxacin], and Tramadol    Social History:  The patient  reports that she has never smoked. She has never used smokeless tobacco. She reports that she does not drink alcohol and does not use drugs.   Family History:  The patient's family history includes COPD in her father; CVA in  her mother; Coronary artery disease in her father; Diabetes Mellitus II in her maternal grandmother and mother; Heart disease in her mother; Hyperlipidemia in her sister; Hypertension in her father, mother, sister, and sister; Uterine cancer in her sister.    ROS:  Please see the history of present illness.   Otherwise, review of systems are positive for none.   All other systems are reviewed and negative.    PHYSICAL EXAM: VS:  BP 118/76 (BP Location: Left Arm, Patient Position: Sitting, Cuff Size: Normal)   Pulse 74   Ht 5' (1.524 m)   Wt 125 lb 9.6 oz (57 kg)   SpO2 98%   BMI 24.53 kg/m  , BMI Body mass index is 24.53 kg/m. GEN: Well nourished, well developed, in no acute distress HEENT: blind, sclera anicteric,  Neck: no JVD, carotid bruits, or masses Cardiac: RRR; no murmurs, rubs, or gallops, no edema  Respiratory:  clear to auscultation bilaterally, normal work of breathing GI: soft, nontender, nondistended, + BS MS: no deformity or atrophy Skin: warm and dry, no rash Neuro:  Strength and sensation are intact Psych: euthymic mood, full affect   EKG:  EKG is not ordered today.    Recent Labs: 03/01/2021: ALT 50; BUN 23; Creatinine, Ser 1.80; Hemoglobin 11.9; Platelets 259; Potassium 4.4; Sodium 130    Lipid Panel    Component Value Date/Time   CHOL 116 12/31/2019 0325   TRIG 216 (H) 12/31/2019 0325   HDL 26 (L) 12/31/2019 0325   CHOLHDL 4.5 12/31/2019 0325   VLDL 43 (H)  12/31/2019 0325   LDLCALC 47 12/31/2019 0325      Wt Readings from Last 3 Encounters:  03/25/21 125 lb 9.6 oz (57 kg)  09/11/20 122 lb (55.3 kg)  06/11/20 120 lb 3.2 oz (54.5 kg)      Other studies Reviewed: Additional studies/ records that were reviewed today include:   Echocardiogram 12/2019: 1. Left ventricular ejection fraction, by estimation, is 50 to 55%. The  left ventricle has low normal function. The left ventricle demonstrates  regional wall motion abnormalities (see scoring diagram/findings for  description). There is mild left  ventricular hypertrophy. Left ventricular diastolic parameters are  consistent with Grade I diastolic dysfunction (impaired relaxation). There  is moderate hypokinesis of the left ventricular, entire inferior wall,  apical segment and inferoseptal wall.   2. Right ventricular systolic function is normal. The right ventricular  size is normal.   3. The mitral valve is grossly normal. No evidence of mitral valve  regurgitation.   4. The aortic valve is tricuspid. Aortic valve regurgitation is not  visualized.   5. The inferior vena cava is normal in size with greater than 50%  respiratory variability, suggesting right atrial pressure of 3 mmHg.   LHC 12/2019: Prox LAD lesion is 30% stenosed. 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 Post intervention, there is a 5% residual stenosis.   Mild nonobstructive stenosis of 30% in the proximal LAD in the region of the first diagonal and septal perforating artery.   Normal ramus intermediate and left circumflex coronary arteries.   Dominant RCA with total mid distal occlusion of the PDA vessel.   LVEDP 22 mmHg.   RECOMMENDATION: DAPT initially but since no stenting was performed duration may be less than 1 year.  Will DC bivalirudin several hours post procedure.  Hydrate.  Aggressive lipid-lowering therapy with target LDL less than 70 and optimal blood pressure control.  Diagnostic Dominance:  Right Intervention  ASSESSMENT AND PLAN:  1. CAD s/p balloon angioplasty to dPDA 12/2019: - Continue aspirin - Continue statin - Continue Bblocker and isordil  2. HTN: BP 128/76 today - Continue metoprolol succinate and isordil  3. HLD: LDL 29, triglycerides 172 01/2021  - Continue crestor - Will add fenofibrate 145mg  daily for   4. DM type 2: A1C 7.4 04/2020 - Continue jardiance and trulicity (in the donut hole - samples provided for jardiance today)  5. Preop assessment: no definitive plans for surgery but would like to pursue carpal tunnel surgery in the near future. She has been stable from a cardiac perspective over the past year. She can easily complete 4 METs without anginal complaints.  - Based on ACC/AHA guidelines, TAYLAN MAYHAN would be at acceptable risk for the planned procedure without further cardiovascular testing.  - The patient was advised that if she develops new symptoms prior to surgery to contact our office to arrange for a follow-up visit, and she verbalized understanding.   Current medicines are reviewed at length with the patient today.  The patient does not have concerns regarding medicines.  The following changes have been made:  As above  Labs/ tests ordered today include:  No orders of the defined types were placed in this encounter.    Disposition:   FU with Dr. Claiborne Billings  in 6 months  Signed, Abigail Butts, PA-C  03/25/2021 9:35 AM

## 2021-03-25 ENCOUNTER — Other Ambulatory Visit: Payer: Self-pay

## 2021-03-25 ENCOUNTER — Ambulatory Visit: Payer: Medicare HMO | Admitting: Medical

## 2021-03-25 ENCOUNTER — Encounter: Payer: Self-pay | Admitting: Medical

## 2021-03-25 VITALS — BP 118/76 | HR 74 | Ht 60.0 in | Wt 125.6 lb

## 2021-03-25 DIAGNOSIS — E785 Hyperlipidemia, unspecified: Secondary | ICD-10-CM

## 2021-03-25 DIAGNOSIS — Z01810 Encounter for preprocedural cardiovascular examination: Secondary | ICD-10-CM | POA: Diagnosis not present

## 2021-03-25 DIAGNOSIS — I10 Essential (primary) hypertension: Secondary | ICD-10-CM

## 2021-03-25 DIAGNOSIS — I2510 Atherosclerotic heart disease of native coronary artery without angina pectoris: Secondary | ICD-10-CM

## 2021-03-25 DIAGNOSIS — E781 Pure hyperglyceridemia: Secondary | ICD-10-CM | POA: Diagnosis not present

## 2021-03-25 DIAGNOSIS — E119 Type 2 diabetes mellitus without complications: Secondary | ICD-10-CM | POA: Diagnosis not present

## 2021-03-25 MED ORDER — METOPROLOL SUCCINATE ER 25 MG PO TB24: 50.0000 mg | ORAL_TABLET | Freq: Every day | ORAL | 6 refills | Status: DC

## 2021-03-25 MED ORDER — FENOFIBRATE 145 MG PO TABS: 145.0000 mg | ORAL_TABLET | Freq: Every day | ORAL | 6 refills | Status: DC

## 2021-03-25 MED ORDER — EMPAGLIFLOZIN 10 MG PO TABS: 10.0000 mg | ORAL_TABLET | Freq: Every day | ORAL | 6 refills | Status: AC

## 2021-03-25 NOTE — Patient Instructions (Signed)
Medication Instructions:  TAKE FENOFIBRATE 145MG  DAILY  TAKE JARDIANCE 10MG  DAILY  *If you need a refill on your cardiac medications before your next appointment, please call your pharmacy*  Lab Work:   Testing/Procedures:  NONE    NONE  Special Instructions PLEASE INCREASE PHYSICAL ACTIVITY AS TOLERATED, 30 MINUTES DAILY  Follow-Up: Your next appointment:  6 month(s) In Person with Shelva Majestic, MD IF UNAVAILABLE Roby Lofts, PA-C  At [facility], you and your health needs are our priority.  As part of our continuing mission to provide you with exceptional heart care, we have created designated Provider Care Teams.  These Care Teams include your primary Cardiologist (physician) and Advanced Practice Providers (APPs -  Physician Assistants and Nurse Practitioners) who all work together to provide you with the care you need, when you need it.  We recommend signing up for the patient portal called "MyChart".  Sign up information is provided on this After Visit Summary.  MyChart is used to connect with patients for Virtual Visits (Telemedicine).  Patients are able to view lab/test results, encounter notes, upcoming appointments, etc.  Non-urgent messages can be sent to your provider as well.   To learn more about what you can do with MyChart, go to [email or web address].

## 2021-04-02 ENCOUNTER — Other Ambulatory Visit (HOSPITAL_COMMUNITY): Payer: Self-pay | Admitting: *Deleted

## 2021-04-03 ENCOUNTER — Other Ambulatory Visit: Payer: Self-pay

## 2021-04-03 ENCOUNTER — Ambulatory Visit (HOSPITAL_COMMUNITY)
Admission: RE | Admit: 2021-04-03 | Discharge: 2021-04-03 | Disposition: A | Discharge status: Home / Self Care | Payer: Medicare HMO | Source: Ambulatory Visit | Attending: Internal Medicine | Admitting: Internal Medicine

## 2021-04-03 DIAGNOSIS — M810 Age-related osteoporosis without current pathological fracture: Secondary | ICD-10-CM | POA: Insufficient documentation

## 2021-04-03 DIAGNOSIS — N184 Chronic kidney disease, stage 4 (severe): Secondary | ICD-10-CM | POA: Diagnosis not present

## 2021-04-03 MED ORDER — DENOSUMAB 60 MG/ML ~~LOC~~ SOSY
60.0000 mg | PREFILLED_SYRINGE | Freq: Once | SUBCUTANEOUS | Status: AC
  Administered 2021-04-03: 60 mg via SUBCUTANEOUS
  Filled 2021-04-03: qty 1

## 2021-04-17 DIAGNOSIS — I129 Hypertensive chronic kidney disease with stage 1 through stage 4 chronic kidney disease, or unspecified chronic kidney disease: Secondary | ICD-10-CM | POA: Diagnosis not present

## 2021-04-17 DIAGNOSIS — I213 ST elevation (STEMI) myocardial infarction of unspecified site: Secondary | ICD-10-CM | POA: Diagnosis not present

## 2021-04-17 DIAGNOSIS — E785 Hyperlipidemia, unspecified: Secondary | ICD-10-CM | POA: Diagnosis not present

## 2021-04-17 DIAGNOSIS — N184 Chronic kidney disease, stage 4 (severe): Secondary | ICD-10-CM | POA: Diagnosis not present

## 2021-04-17 DIAGNOSIS — R7989 Other specified abnormal findings of blood chemistry: Secondary | ICD-10-CM | POA: Diagnosis not present

## 2021-04-17 DIAGNOSIS — E1122 Type 2 diabetes mellitus with diabetic chronic kidney disease: Secondary | ICD-10-CM | POA: Diagnosis not present

## 2021-04-17 DIAGNOSIS — N2581 Secondary hyperparathyroidism of renal origin: Secondary | ICD-10-CM | POA: Diagnosis not present

## 2021-04-29 ENCOUNTER — Ambulatory Visit: Payer: Medicare HMO | Admitting: Cardiovascular Disease

## 2021-04-30 ENCOUNTER — Ambulatory Visit: Payer: Medicare HMO | Admitting: Gastroenterology

## 2021-05-26 ENCOUNTER — Ambulatory Visit: Payer: Medicare HMO | Admitting: Physician Assistant

## 2021-05-26 ENCOUNTER — Encounter: Payer: Self-pay | Admitting: Physician Assistant

## 2021-05-26 VITALS — BP 122/80 | HR 76 | Ht 60.0 in | Wt 119.0 lb

## 2021-05-26 DIAGNOSIS — K5900 Constipation, unspecified: Secondary | ICD-10-CM

## 2021-05-26 DIAGNOSIS — R1319 Other dysphagia: Secondary | ICD-10-CM | POA: Diagnosis not present

## 2021-05-26 DIAGNOSIS — K222 Esophageal obstruction: Secondary | ICD-10-CM | POA: Diagnosis not present

## 2021-05-26 DIAGNOSIS — K219 Gastro-esophageal reflux disease without esophagitis: Secondary | ICD-10-CM

## 2021-05-26 MED ORDER — FAMOTIDINE 40 MG PO TABS: 40.0000 mg | ORAL_TABLET | Freq: Two times a day (BID) | ORAL | 3 refills | Status: DC

## 2021-05-26 NOTE — Progress Notes (Signed)
Chief Complaint: GERD and constipation  HPI:    Jessica [name] is a 70 year old female with a past medical history of CKD (12/31/2019 echo with LVEF 50-55%), CAD status post stenting in June 2021 previously on blood thinners, now she is not, GERD, hypertension and multiple others listed below, known to Dr. Henrene Pastor, who was referred to me by Ginger Organ., MD for a complaint of GERD and constipation.    02/07/2019 EGD done for dysphagia and reflux with balloon dilation of the esophagus to 20 mm.  Finding of benign-appearing esophageal stenosis.  Otherwise normal.    02/07/2019 colonoscopy for screening with 2 1-2 mm polyps in the ascending colon, diverticulosis in the sigmoid and right colon and otherwise normal.  Pathology showed adenomatous polyps.  Patient was not told to repeat due to age.    03/25/2021 patient followed with cardiology and was doing well.    Today, patient presents to clinic accompanied by her husband and her sister-in-law.  Patient and her husband are both visually impaired.  She explains that she is having an increasing problem with food feeling like it gets stuck in her throat on the way down.  She has to always drink a sip of water or liquid behind it in order to make sure that it goes all the way down.  Tells me that she had a previous EGD for the same reason and when she was dilated this helped for almost a year.  She is wondering if she can have another dilation now.  Does suffer from breakthrough reflux symptoms regardless of her Pantoprazole 40 mg once a day and Pepcid 20 mg twice a day.  Tells me she is nervous to go on more Pantoprazole as she has stage III kidney disease and does not want to make it worse.    Also discusses some constipation.  Has tried over-the-counter laxatives which work here and there.  Describes passing hard stools.    Denies fever, chills, weight loss, blood in her stool or symptoms that awaken her from sleep.  Past Medical History:  Diagnosis Date    Anemia    Anxiety    CKD (chronic kidney disease)    CKD III per PCP notes-pt denies   Congenital blindness    Diabetes mellitus    Diverticula of small intestine    Esophageal stricture    GERD (gastroesophageal reflux disease)    H/O hiatal hernia    Hyperlipidemia    Hypertension    Hypothyroidism    Iron deficiency anemia    Low magnesium level    Low sodium levels    Myocardial infarct (HCC)    Osteopenia    Osteoporosis    Postoperative nausea and vomiting    Renal disease    Retinitis    STEMI (ST elevation myocardial infarction) ([place])    SVT (supraventricular tachycardia) ([place])    Vitamin B 12 deficiency     Past Surgical History:  Procedure Laterality Date   BACK SURGERY     CARPAL TUNNEL RELEASE     Right    CESAREAN SECTION     CHOLECYSTECTOMY  1985   COLONOSCOPY     CORONARY BALLOON ANGIOPLASTY N/A 12/30/2019   Procedure: CORONARY BALLOON ANGIOPLASTY;  Surgeon: Troy Sine, MD;  Location: [facility];  Service: Cardiovascular;  Laterality: N/A;   CORONARY/GRAFT ACUTE MI REVASCULARIZATION N/A 12/30/2019   Procedure: Coronary/Graft Acute MI Revascularization;  Surgeon: Troy Sine, MD;  [facility]  INV[facility]IVE [facility];  Service: Cardiovascular;  Laterality: N/A;   HERNIA REPAIR     LEFT HEART CATH AND CORONARY ANGIOGRAPHY N/A 12/30/2019   Procedure: LEFT HEART CATH AND CORONARY ANGIOGRAPHY;  Surgeon: Troy Sine, MD;  Location: [facility];  Service: Cardiovascular;  Laterality: N/A;   Right knee arthroscopy  1993   Right thyroidectomy  2007   TOTAL ABDOMINAL HYSTERECTOMY W/ BILATERAL SALPINGOOPHORECTOMY  1993   UPPER GASTROINTESTINAL ENDOSCOPY      Current Outpatient Medications  Medication Sig Dispense Refill   acetaminophen (TYLENOL) 325 MG tablet Take 2 tablets (650 mg total) by mouth every 4 (four) hours as needed for mild pain.     aspirin EC 81 MG tablet Take 1 tablet (81 mg total) by mouth daily. Swallow whole. 30 tablet 11    Coenzyme Q10 (CO Q 10) 100 MG CAPS Take 100 mg by mouth daily.      cyanocobalamin (,VITAMIN B-12,) 1000 MCG/ML injection Inject 1,000 mcg into the muscle every 30 (thirty) days.     empagliflozin (JARDIANCE) 10 MG TABS tablet Take 1 tablet (10 mg total) by mouth daily. 30 tablet 6   famotidine (PEPCID) 20 MG tablet Take 20 mg by mouth 2 (two) times daily.     fenofibrate (TRICOR) 145 MG tablet Take 1 tablet (145 mg total) by mouth daily. 30 tablet 6   fluticasone (FLONASE) 50 MCG/ACT nasal spray Place 1 spray into both nostrils daily as needed for allergies.      isosorbide dinitrate (ISORDIL) 30 MG tablet Take by mouth as directed. TAKE 1/2 TABLET DAILY     levothyroxine (SYNTHROID) 50 MCG tablet Take 50 mcg by mouth daily before breakfast. 1 Tablet Daily Before Breakfast     LORazepam (ATIVAN) 0.5 MG tablet Take 0.5 mg by mouth See admin instructions. Take one tablet (0.5 mg) by mouth daily at bedtime, may also take one tablet (0.5 mg) during the day as needed for anxiety.     magnesium oxide (MAG-OX) 400 MG tablet Take 400 mg by mouth daily.     metoprolol succinate (TOPROL-XL) 25 MG 24 hr tablet Take 2 tablets (50 mg total) by mouth daily. 30 tablet 6   nitroGLYCERIN (NITROSTAT) 0.4 MG SL tablet Place 1 tablet (0.4 mg total) under the tongue every 5 (five) minutes x 3 doses as needed for chest pain. 25 tablet 12   pantoprazole (PROTONIX) 40 MG tablet Take 40 mg by mouth daily.     rosuvastatin (CRESTOR) 40 MG tablet TAKE 1 TABLET AT BEDTIME 90 tablet 3   sertraline (ZOLOFT) 50 MG tablet Take 50 mg by mouth at bedtime.      No current facility-administered medications for this visit.    Allergies as of 05/26/2021 - Review Complete 04/03/2021  Allergen Reaction Noted   Codeine Shortness Of Breath and Nausea And Vomiting 01/05/2011   Erythromycin Nausea And Vomiting 01/05/2011   Levaquin [levofloxacin] Shortness Of Breath 06/06/2013   Tramadol Nausea And Vomiting 06/11/2020    Family  History  Problem Relation Age of Onset   Coronary artery disease Father    COPD Father    CVA Mother    Diabetes Mellitus II Mother    Heart disease Mother    Diabetes Mellitus II Maternal Grandmother    Uterine cancer Sister    Hypertension Sister    Hypertension Sister        Brother also   Hyperlipidemia Sister  Brother also   Colon cancer Neg Hx    Rectal cancer Neg Hx    Stomach cancer Neg Hx    Esophageal cancer Neg Hx    Hypertension Mother    Hypertension Father     Social History   Socioeconomic History   Marital status: Married    Spouse name: Not on file   Number of children: 4   Years of education: Not on file   Highest education level: Not on file  Occupational History   Occupation: house wife    Occupation: retired    Fish farm manager: [employer]  Tobacco Use   Smoking status: Never   Smokeless tobacco: Never  Vaping Use   Vaping Use: Never used  Substance and Sexual Activity   Alcohol use: No   Drug use: No   Sexual activity: Not Currently  Other Topics Concern   Not on file  Social History Narrative   ** Merged History Encounter **       Social Determinants of Health   Financial Resource Strain: Not on file  Food Insecurity: Not on file  Transportation Needs: Not on file  Physical Activity: Not on file  Stress: Not on file  Social Connections: Not on file  Intimate Partner Violence: Not on file    Review of Systems:    Constitutional: No weight loss, fever or chills Cardiovascular: No chest pain Respiratory: No SOB Gastrointestinal: See HPI and otherwise negative   Physical Exam:  Vital signs: BP 122/80   Pulse 76   Ht 5' (1.524 m)   Wt 119 lb (54 kg)   BMI 23.24 kg/m   Constitutional:   Pleasant elderly Caucasian female appears to be in NAD, Well developed, Well nourished, alert and cooperative +visually impaired Respiratory: Respirations even and unlabored. Lungs clear to auscultation bilaterally.   No wheezes,  crackles, or rhonchi.  Cardiovascular: Normal S1, S2. No MRG. Regular rate and rhythm. No peripheral edema, cyanosis or pallor.  Gastrointestinal:  Soft, nondistended, nontender. No rebound or guarding. Normal bowel sounds. No appreciable masses or hepatomegaly. Rectal:  Not performed.  Psychiatric: Demonstrates good judgement and reason without abnormal affect or behaviors.  RELEVANT LABS AND IMAGING: CBC    Component Value Date/Time   WBC 6.9 03/01/2021 1823   RBC 3.74 (L) 03/01/2021 1823   HGB 11.9 (L) 03/01/2021 1830   HCT 35.0 (L) 03/01/2021 1830   PLT 259 03/01/2021 1823   MCV 93.0 03/01/2021 1823   MCH 30.7 03/01/2021 1823   MCHC 33.0 03/01/2021 1823   RDW 12.6 03/01/2021 1823   LYMPHSABS 2.0 03/01/2021 1823   MONOABS 0.8 03/01/2021 1823   EOSABS 0.2 03/01/2021 1823   BASOSABS 0.0 03/01/2021 1823    CMP     Component Value Date/Time   NA 130 (L) 03/01/2021 1830   K 4.4 03/01/2021 1830   CL 98 03/01/2021 1830   CO2 21 (L) 03/01/2021 1823   GLUCOSE 125 (H) 03/01/2021 1830   BUN 23 03/01/2021 1830   CREATININE 1.80 (H) 03/01/2021 1830   CALCIUM 9.2 03/01/2021 1823   PROT 6.6 03/01/2021 1823   ALBUMIN 3.9 03/01/2021 1823   AST 38 03/01/2021 1823   ALT 50 (H) 03/01/2021 1823   ALKPHOS 62 03/01/2021 1823   BILITOT 0.5 03/01/2021 1823   GFRNONAA 28 (L) 03/01/2021 1823   GFRAA 27 (L) 01/14/2020 0504    Assessment: 1.  Dysphagia: History of previous stenosis dilated in 2020 with good results for about  12 months, now with repeat symptoms 2.  Chronic reflux: Moderately controlled with Pantoprazole 40 mg once daily and Pepcid 20 mg twice daily but does have breakthrough symptoms likely leading to above 3.  CAD: Status post stenting in June 2021, recent follow-up with cardiology and doing well on aspirin 4.  Constipation: Likely due to slow transit and age  Plan: 1.  Scheduled patient for an EGD with dilation in the [place].  Previous stricture and dilation which helped in  the past.  This is scheduled with Dr. Henrene Pastor.  Did provide the patient a detailed list of risks for procedure and she agrees to proceed. 2.  Patient nervous to increase her PPI due to kidney disease.  Continue Pantoprazole 40 mg once daily.  Increase Pepcid to 40 mg twice daily, every morning and nightly #180.  Patient request we send this to her mail order pharmacy. 3.  Recommend patient start MiraLAX once daily for occasional constipation 4.  Patient to follow in clinic per recommendations from Dr. Henrene Pastor after time of procedure  Ellouise Newer, PA-C [facility] 05/26/2021, 1:51 PM  Cc: Ginger Organ., MD

## 2021-05-26 NOTE — Patient Instructions (Signed)
We have sent the following medications to your pharmacy for you to pick up at your convenience: Pepcid 40 mg twice daily.   Start Miralax 1 capful daily in 8 ounces of liquid.  You have been scheduled for an endoscopy. Please follow written instructions given to you at your visit today. If you use inhalers (even only as needed), please bring them with you on the day of your procedure.  If you are age 72 or older, your body mass index should be between 23-30. Your Body mass index is 23.24 kg/m. If this is out of the aforementioned range listed, please consider follow up with your Primary Care Provider.  If you are age 98 or younger, your body mass index should be between 19-25. Your Body mass index is 23.24 kg/m. If this is out of the aformentioned range listed, please consider follow up with your Primary Care Provider.   ________________________________________________________  The [facility] providers would like to encourage you to use [facility] to communicate with providers for non-urgent requests or questions.  Due to long hold times on the telephone, sending your provider a message by [facility] may be a faster and more efficient way to get a response.  Please allow 48 business hours for a response.  Please remember that this is for non-urgent requests.  _______________________________________________________

## 2021-05-26 NOTE — Progress Notes (Signed)
Assessment and plans reviewed  

## 2021-06-04 ENCOUNTER — Other Ambulatory Visit: Payer: Self-pay

## 2021-06-04 ENCOUNTER — Encounter: Payer: Self-pay | Admitting: Internal Medicine

## 2021-06-04 ENCOUNTER — Ambulatory Visit (AMBULATORY_SURGERY_CENTER): Payer: Medicare HMO | Admitting: Internal Medicine

## 2021-06-04 VITALS — BP 108/74 | HR 72 | Temp 98.4°F | Resp 10 | Ht 60.0 in | Wt 119.0 lb

## 2021-06-04 DIAGNOSIS — R1319 Other dysphagia: Secondary | ICD-10-CM

## 2021-06-04 DIAGNOSIS — K222 Esophageal obstruction: Secondary | ICD-10-CM | POA: Diagnosis not present

## 2021-06-04 DIAGNOSIS — R1310 Dysphagia, unspecified: Secondary | ICD-10-CM | POA: Diagnosis not present

## 2021-06-04 DIAGNOSIS — K219 Gastro-esophageal reflux disease without esophagitis: Secondary | ICD-10-CM

## 2021-06-04 MED ORDER — SODIUM CHLORIDE 0.9 % IV SOLN: 500.0000 mL | Freq: Once | INTRAVENOUS | Status: DC

## 2021-06-04 NOTE — Op Note (Signed)
[place] Patient Name: Jessica [name] Procedure Date: 06/04/2021 8:53 AM MRN: [id number] Endoscopist: Docia Chuck. Henrene Pastor , MD Age: 70 Referring MD:  Date of Birth: 15-Apr-1951 Gender: Female Account #: [id number] Procedure:                Upper GI endoscopy with balloon dilation of the                            esophagus. 20 mm max Indications:              Dysphagia, Therapeutic procedure. Previous EGD with                            dilation July 2020 Medicines:                Monitored Anesthesia Care Procedure:                Pre-Anesthesia Assessment:                           - Prior to the procedure, a History and Physical                            was performed, and patient medications and                            allergies were reviewed. The patient's tolerance of                            previous anesthesia was also reviewed. The risks                            and benefits of the procedure and the sedation                            options and risks were discussed with the patient.                            All questions were answered, and informed consent                            was obtained. Prior Anticoagulants: The patient has                            taken no previous anticoagulant or antiplatelet                            agents. ASA Grade Assessment: II - A patient with                            mild systemic disease. After reviewing the risks                            and benefits, the patient was deemed in  satisfactory condition to undergo the procedure.                           After obtaining informed consent, the endoscope was                            passed under direct vision. Throughout the                            procedure, the patient's blood pressure, pulse, and                            oxygen saturations were monitored continuously. The                            GIF [id number] #[id number] was introduced  through the                            mouth, and advanced to the second part of duodenum.                            The upper GI endoscopy was accomplished without                            difficulty. The patient tolerated the procedure                            well. Scope In: Scope Out: Findings:                 There was some resistance at the level of the                            cricopharyngeus. No discrete lesion or stricture.                            Exam of the esophagus was otherwise normal (save                            stricture described below).                           One benign-appearing, intrinsic moderate stenosis                            was found 33 cm from the incisors. This stenosis                            measured 1.5 cm (inner diameter). After completing                            the endoscopic survey, A TTS dilator was passed                            through the scope. Dilation  with an 18-19-20 mm                            balloon dilator was performed to 20 mm.                           The stomach was normal, save sliding hiatal hernia.                           The examined duodenum was normal.                           The cardia and gastric fundus were normal on                            retroflexion. Complications:            No immediate complications. Estimated Blood Loss:     Estimated blood loss: none. Impression:               - Benign-appearing esophageal stenosis. Dilated.                           - Normal stomach, save hiatal hernia.                           - Normal examined duodenum.                           - No specimens collected. Recommendation:           - Patient has a contact number available for                            emergencies. The signs and symptoms of potential                            delayed complications were discussed with the                            patient. Return to normal activities tomorrow.                             Written discharge instructions were provided to the                            patient.                           - Resume previous diet.                           - Continue present medications. Docia Chuck. Henrene Pastor, MD 06/04/2021 9:32:46 AM This report has been signed electronically.

## 2021-06-04 NOTE — Patient Instructions (Signed)
HANDOUT GIVEN FOR POST DILATION DIET.  NOTHING BY MOUTH UNTIL 10:30 THEN CLEAR LIQUIDS UNTIL 11:30 am!  SOFT DIET THE REST OF TODAY!  YOU HAD AN ENDOSCOPIC PROCEDURE TODAY AT [facility]:   Refer to the procedure report that was given to you for any specific questions about what was found during the examination.  If the procedure report does not answer your questions, please call your gastroenterologist to clarify.  If you requested that your care partner not be given the details of your procedure findings, then the procedure report has been included in a sealed envelope for you to review at your convenience later.  YOU SHOULD EXPECT: Some feelings of bloating in the abdomen. Passage of more gas than usual.  Walking can help get rid of the air that was put into your GI tract during the procedure and reduce the bloating. If you had a lower endoscopy (such as a colonoscopy or flexible sigmoidoscopy) you may notice spotting of blood in your stool or on the toilet paper. If you underwent a bowel prep for your procedure, you may not have a normal bowel movement for a few days.  Please Note:  You might notice some irritation and congestion in your nose or some drainage.  This is from the oxygen used during your procedure.  There is no need for concern and it should clear up in a day or so.  SYMPTOMS TO REPORT IMMEDIATELY:  Following upper endoscopy (EGD)  Vomiting of blood or coffee ground material  New chest pain or pain under the shoulder blades  Painful or persistently difficult swallowing  New shortness of breath  Fever of 100F or higher  Black, tarry-looking stools  For urgent or emergent issues, a gastroenterologist can be reached at any hour by calling [phone]. Do not use MyChart messaging for urgent concerns.    DIET: SEE HANDOUT FOR POST DILATION DIET!  NOTHING BY MOUTH UNTIL but TOMORROW you may proceed to your regular diet.  Drink plenty of fluids but you should  avoid alcoholic beverages for 24 hours.  ACTIVITY:  You should plan to take it easy for the rest of today and you should NOT DRIVE or use heavy machinery until tomorrow (because of the sedation medicines used during the test).    FOLLOW UP: Our staff will call the number listed on your records 48-72 hours following your procedure to check on you and address any questions or concerns that you may have regarding the information given to you following your procedure. If we do not reach you, we will leave a message.  We will attempt to reach you two times.  During this call, we will ask if you have developed any symptoms of COVID 19. If you develop any symptoms (ie: fever, flu-like symptoms, shortness of breath, cough etc.) before then, please call [phone].  If you test positive for Covid 19 in the 2 weeks post procedure, please call and report this information to [place].    If any biopsies were taken you will be contacted by phone or by letter within the next 1-3 weeks.  Please call us at [phone] if you have not heard about the biopsies in 3 weeks.    SIGNATURES/CONFIDENTIALITY: You and/or your care partner have signed paperwork which will be entered into your electronic medical record.  These signatures attest to the fact that that the information above on your After Visit Summary has been reviewed and is understood.  Full responsibility  of the confidentiality of this discharge information lies with you and/or your care-partner.

## 2021-06-04 NOTE — Progress Notes (Signed)
Reviewed instructions and post dilation diet with pt and husband, they are unable to read written instructions, pt has phone number to call memorized for any adverse reactions or problems.  Reviewed instructions and post dilation diet with pt's pastor who is driving them, states he will review written instructions again w/ them, pt aware she is npo until 1030 am then clear liquids untl 1130 then soft diet until tomorrow and to proceed to regular diet as tolerated and chew food well and swallow with lots of water.

## 2021-06-04 NOTE — Progress Notes (Signed)
D.T. vital signs. °

## 2021-06-04 NOTE — Progress Notes (Signed)
Pt's states no medical or surgical changes since previsit or office visit. 

## 2021-06-04 NOTE — Progress Notes (Signed)
GI PREPROCEDURE H&P  The patient was fully evaluated in the office May 26, 2021 regarding GERD and dysphagia.  See that complete H&P.  No interval changes.  She is now for upper endoscopy with esophageal dilation  Assessment: 1.  Dysphagia: History of previous stenosis dilated in 2020 with good results for about 12 months, now with repeat symptoms 2.  Chronic reflux: Moderately controlled with Pantoprazole 40 mg once daily and Pepcid 20 mg twice daily but does have breakthrough symptoms likely leading to above 3.  CAD: Status post stenting in June 2021, recent follow-up with cardiology and doing well on aspirin 4.  Constipation: Likely due to slow transit and age   Plan: 1.  Scheduled patient for an EGD with dilation in the [place].  Previous stricture and dilation which helped in the past.  This is scheduled with Dr. Henrene Pastor.  Did provide the patient a detailed list of risks for procedure and she agrees to proceed. 2.  Patient nervous to increase her PPI due to kidney disease.  Continue Pantoprazole 40 mg once daily.  Increase Pepcid to 40 mg twice daily, every morning and nightly #180.  Patient request we send this to her mail order pharmacy. 3.  Recommend patient start MiraLAX once daily for occasional constipation 4.  Patient to follow in clinic per recommendations from Dr. Henrene Pastor after time of procedure

## 2021-06-04 NOTE — Progress Notes (Signed)
Sedate, gd SR, tolerated procedure well, VSS, report to RN 

## 2021-06-04 NOTE — Progress Notes (Signed)
Called to room to assist during endoscopic procedure.  Patient ID and intended procedure confirmed with present staff. Received instructions for my participation in the procedure from the performing physician.  

## 2021-06-08 ENCOUNTER — Telehealth: Payer: Self-pay

## 2021-06-08 NOTE — Telephone Encounter (Signed)
  Follow up Call-  Call back number 06/04/2021 02/07/2019  Post procedure Call Back phone  # [phone]  Permission to leave phone message Yes Yes  Some recent data might be hidden     Patient questions:  Do you have a fever, pain , or abdominal swelling? No. Pain Score  0 *  Have you tolerated food without any problems? Yes.    Have you been able to return to your normal activities? Yes.    Do you have any questions about your discharge instructions: Diet   No. Medications  No. Follow up visit  No.  Do you have questions or concerns about your Care? No.  Actions: * If pain score is 4 or above: No action needed, pain <4.

## 2021-06-09 ENCOUNTER — Other Ambulatory Visit: Payer: Self-pay | Admitting: Internal Medicine

## 2021-06-09 DIAGNOSIS — R911 Solitary pulmonary nodule: Secondary | ICD-10-CM

## 2021-06-30 ENCOUNTER — Ambulatory Visit
Admission: RE | Admit: 2021-06-30 | Discharge: 2021-06-30 | Disposition: A | Discharge status: Home / Self Care | Payer: Medicare HMO | Source: Ambulatory Visit | Attending: Internal Medicine | Admitting: Internal Medicine

## 2021-06-30 DIAGNOSIS — I700 Atherosclerosis of aorta: Secondary | ICD-10-CM | POA: Diagnosis not present

## 2021-06-30 DIAGNOSIS — R911 Solitary pulmonary nodule: Secondary | ICD-10-CM | POA: Diagnosis not present

## 2021-07-20 ENCOUNTER — Other Ambulatory Visit: Payer: Self-pay | Admitting: Cardiovascular Disease

## 2021-08-20 DIAGNOSIS — E538 Deficiency of other specified B group vitamins: Secondary | ICD-10-CM | POA: Diagnosis not present

## 2021-08-20 DIAGNOSIS — M810 Age-related osteoporosis without current pathological fracture: Secondary | ICD-10-CM | POA: Diagnosis not present

## 2021-08-20 DIAGNOSIS — Z0000 Encounter for general adult medical examination without abnormal findings: Secondary | ICD-10-CM | POA: Diagnosis not present

## 2021-08-20 DIAGNOSIS — E039 Hypothyroidism, unspecified: Secondary | ICD-10-CM | POA: Diagnosis not present

## 2021-08-20 DIAGNOSIS — E1129 Type 2 diabetes mellitus with other diabetic kidney complication: Secondary | ICD-10-CM | POA: Diagnosis not present

## 2021-08-20 DIAGNOSIS — E785 Hyperlipidemia, unspecified: Secondary | ICD-10-CM | POA: Diagnosis not present

## 2021-08-27 DIAGNOSIS — I700 Atherosclerosis of aorta: Secondary | ICD-10-CM | POA: Diagnosis not present

## 2021-08-27 DIAGNOSIS — N184 Chronic kidney disease, stage 4 (severe): Secondary | ICD-10-CM | POA: Diagnosis not present

## 2021-08-27 DIAGNOSIS — Z1339 Encounter for screening examination for other mental health and behavioral disorders: Secondary | ICD-10-CM | POA: Diagnosis not present

## 2021-08-27 DIAGNOSIS — E785 Hyperlipidemia, unspecified: Secondary | ICD-10-CM | POA: Diagnosis not present

## 2021-08-27 DIAGNOSIS — D692 Other nonthrombocytopenic purpura: Secondary | ICD-10-CM | POA: Diagnosis not present

## 2021-08-27 DIAGNOSIS — I129 Hypertensive chronic kidney disease with stage 1 through stage 4 chronic kidney disease, or unspecified chronic kidney disease: Secondary | ICD-10-CM | POA: Diagnosis not present

## 2021-08-27 DIAGNOSIS — I2510 Atherosclerotic heart disease of native coronary artery without angina pectoris: Secondary | ICD-10-CM | POA: Diagnosis not present

## 2021-08-27 DIAGNOSIS — E1129 Type 2 diabetes mellitus with other diabetic kidney complication: Secondary | ICD-10-CM | POA: Diagnosis not present

## 2021-08-27 DIAGNOSIS — Z1331 Encounter for screening for depression: Secondary | ICD-10-CM | POA: Diagnosis not present

## 2021-08-27 DIAGNOSIS — Z0000 Encounter for general adult medical examination without abnormal findings: Secondary | ICD-10-CM | POA: Diagnosis not present

## 2021-08-27 DIAGNOSIS — I252 Old myocardial infarction: Secondary | ICD-10-CM | POA: Diagnosis not present

## 2021-08-27 DIAGNOSIS — J209 Acute bronchitis, unspecified: Secondary | ICD-10-CM | POA: Diagnosis not present

## 2021-08-27 DIAGNOSIS — Z9861 Coronary angioplasty status: Secondary | ICD-10-CM | POA: Diagnosis not present

## 2021-10-05 ENCOUNTER — Encounter: Payer: Self-pay | Admitting: Cardiovascular Disease

## 2021-10-05 ENCOUNTER — Ambulatory Visit: Payer: Medicare HMO | Admitting: Cardiovascular Disease

## 2021-10-05 ENCOUNTER — Other Ambulatory Visit: Payer: Self-pay

## 2021-10-05 VITALS — BP 140/80 | HR 64 | Ht 60.0 in | Wt 119.6 lb

## 2021-10-05 DIAGNOSIS — I2510 Atherosclerotic heart disease of native coronary artery without angina pectoris: Secondary | ICD-10-CM

## 2021-10-05 DIAGNOSIS — E039 Hypothyroidism, unspecified: Secondary | ICD-10-CM

## 2021-10-05 DIAGNOSIS — N184 Chronic kidney disease, stage 4 (severe): Secondary | ICD-10-CM

## 2021-10-05 DIAGNOSIS — I10 Essential (primary) hypertension: Secondary | ICD-10-CM | POA: Diagnosis not present

## 2021-10-05 DIAGNOSIS — I2111 ST elevation (STEMI) myocardial infarction involving right coronary artery: Secondary | ICD-10-CM | POA: Diagnosis not present

## 2021-10-05 DIAGNOSIS — E785 Hyperlipidemia, unspecified: Secondary | ICD-10-CM

## 2021-10-05 DIAGNOSIS — E118 Type 2 diabetes mellitus with unspecified complications: Secondary | ICD-10-CM | POA: Diagnosis not present

## 2021-10-05 DIAGNOSIS — E781 Pure hyperglyceridemia: Secondary | ICD-10-CM

## 2021-10-05 NOTE — Progress Notes (Unsigned)
Cardiology Office Note    Date:  10/05/2021   ID:  Sumiya, Mamaril 15-Feb-1951, MRN [id number]  PCP:  Ginger Organ., MD  Cardiologist:  Shelva Majestic, MD   F/U office visit with me  History of Present Illness:  Jessica [name] is a 71 y.o. female who suffered an acute coronary syndrome on December 30, 2019.  She presents for a 16-month follow-up cardiology evaluation   Ms. Nipper  has a history of hypertension and hyperlipidemia.  In early June 2021 she began to experience episodic shortness of breath.  On the morning of December 30, 2019 she experienced significant substernal chest pressure and tightness with radiation to her back.  She presented to [facility] emergency room where her ECG initially showed early inferior ST elevation.  A code STEMI was activated.  She was taken emergently to the catheterization laboratory by me and was found to have mild nonobstructive stenosis of 30% in the proximal LAD in the region of the first diagonal septal perforating artery.  She had a normal ramus intermediate and left circumflex vessel.  She had a dominant RCA with total mid distal occlusion of the PDA vessel.  Her vessel caliber was small and she underwent successful balloon angioplasty with restoration of TIMI-3 flow.  When evaluated by Jory Sims in July 2021 she continued to be on antiplatelet therapy with aspirin and Brilinta.  An initial echo Doppler study had shown an EF of 55 to 60% with grade 1 diastolic dysfunction with wall motion abnormality involving the inferior wall apical segment inferoseptal segment.  She was readmitted to the hospital on January 13, 2020 with associated chest and epigastric pain with radiation to her upper back and shoulder blades.  Amlodipine and Imdur were added to her medical regimen.  She subsequently developed hypotension and amlodipine dose was decreased to 5 mg and carvedilol to 6.25 mg twice a day.  I saw her in October 2021 for my initial in office evaluation.  At that  time he denied any recurrent chest pain off and felt like she had a yawn to take a deep breath.   She was on aspirin and ticagrelor for antiplatelet therapy.  She waas on isosorbide 15 mg and carvedilol 6.25 mg twice a day; however was only taking carvedilol on a daily basis. She is diabetic on Jardiance 10 mg and has hypothyroidism on levothyroxine 75 mcg. On rosuvastatin 20 mg LDL cholesterol was 47.  During my initial evaluation, since she was having difficulty taking the carvedilol twice a day I changed her to metoprolol succinate 12.5 mg daily.  She had been on levothyroxine for hypothyroidism and laboratory by her primary physician in June 2021 revealed her to be over suppressed.  She sees Dr. Lang Snow for her primary care.  Since I saw her, she was evaluated in a telemedicine visit by Sande Rives, PA-C in November.  This evaluation was prompted by her prior emergency room visit where she presented with syncope.  Apparently, the patient was walking through her kitchen at a fast pace and accidentally struck her abdomen against the corner of the countertop.  He is legally blind.  An abdominal/pelvic CT showed a large abdominal wall hematoma secondary to her blunt trauma.  She required several units of packed red blood cells.  Aspirin and Brilinta were held.  Ultimately it was felt that she could discontinue Brilinta she had previously undergone balloon angioplasty not stenting due to her all distal vessel.  When  valuated by Sande Rives, her hemoglobin had increased.  Time she had noted some mild lightheadedness and dizziness.  She had Covid in December 20221 and underwent antibody infusion therapy.  Her symptoms included weakness, fatigue, cough and fever.  Presently, she does admit at times to her heart racing.  Still notes a slight lump in her abdomen at the site of her recent blunt trauma from hitting the corner of her countertop.  Her hemoglobin has been stable and she states most recently it  was 13.2.  She denies recurrent anginal symptoms.  She presents for evaluation.  Past Medical History:  Diagnosis Date   Anemia    Anxiety    CKD (chronic kidney disease)    CKD III per PCP notes-pt denies   Congenital blindness    Diabetes mellitus    Diverticula of small intestine    Esophageal stricture    GERD (gastroesophageal reflux disease)    H/O hiatal hernia    Hyperlipidemia    Hypertension    Hypothyroidism    Iron deficiency anemia    Low magnesium level    Low sodium levels    Myocardial infarct (HCC)    Osteopenia    Osteoporosis    Postoperative nausea and vomiting    Renal disease    Retinitis    STEMI (ST elevation myocardial infarction) ([place])    SVT (supraventricular tachycardia) ([place])    Vitamin B 12 deficiency     Past Surgical History:  Procedure Laterality Date   BACK SURGERY     CARPAL TUNNEL RELEASE     Right    CESAREAN SECTION     CHOLECYSTECTOMY  1985   COLONOSCOPY     CORONARY BALLOON ANGIOPLASTY N/A 12/30/2019   Procedure: CORONARY BALLOON ANGIOPLASTY;  Surgeon: Troy Sine, MD;  Location: [facility];  Service: Cardiovascular;  Laterality: N/A;   CORONARY/GRAFT ACUTE MI REVASCULARIZATION N/A 12/30/2019   Procedure: Coronary/Graft Acute MI Revascularization;  Surgeon: Troy Sine, MD;  Location: [facility];  Service: Cardiovascular;  Laterality: N/A;   HERNIA REPAIR     LEFT HEART CATH AND CORONARY ANGIOGRAPHY N/A 12/30/2019   Procedure: LEFT HEART CATH AND CORONARY ANGIOGRAPHY;  Surgeon: Troy Sine, MD;  Location: [facility];  Service: Cardiovascular;  Laterality: N/A;   Right knee arthroscopy  1993   Right thyroidectomy  2007   TOTAL ABDOMINAL HYSTERECTOMY W/ BILATERAL SALPINGOOPHORECTOMY  1993   UPPER GASTROINTESTINAL ENDOSCOPY      Current Medications: Outpatient Medications Prior to Visit  Medication Sig Dispense Refill   acetaminophen (TYLENOL) 325 MG tablet Take 2 tablets (650 mg total) by mouth  every 4 (four) hours as needed for mild pain.     aspirin EC 81 MG tablet Take 1 tablet (81 mg total) by mouth daily. Swallow whole. 30 tablet 11   Coenzyme Q10 (CO Q 10) 100 MG CAPS Take 100 mg by mouth daily.      cyanocobalamin (,VITAMIN B-12,) 1000 MCG/ML injection Inject 1,000 mcg into the muscle every 30 (thirty) days.     Dulaglutide (TRULICITY) 1.5 YJ/8.5UD SOPN Inject into the skin once a week.     empagliflozin (JARDIANCE) 10 MG TABS tablet Take 1 tablet (10 mg total) by mouth daily. 30 tablet 6   famotidine (PEPCID) 40 MG tablet Take 1 tablet (40 mg total) by mouth 2 (two) times daily. 180 tablet 3   fenofibrate (TRICOR) 145 MG tablet Take 1 tablet (145 mg  total) by mouth daily. 30 tablet 6   levothyroxine (SYNTHROID) 50 MCG tablet Take 50 mcg by mouth daily before breakfast. 1 Tablet Daily Before Breakfast     LORazepam (ATIVAN) 0.5 MG tablet Take 0.5 mg by mouth See admin instructions. Take one tablet (0.5 mg) by mouth daily at bedtime, may also take one tablet (0.5 mg) during the day as needed for anxiety.     magnesium oxide (MAG-OX) 400 MG tablet Take 400 mg by mouth daily.     metoprolol succinate (TOPROL-XL) 25 MG 24 hr tablet Take 2 tablets (50 mg total) by mouth daily. 30 tablet 6   nitroGLYCERIN (NITROSTAT) 0.4 MG SL tablet Place 1 tablet (0.4 mg total) under the tongue every 5 (five) minutes x 3 doses as needed for chest pain. 25 tablet 12   pantoprazole (PROTONIX) 40 MG tablet Take 40 mg by mouth daily.     rosuvastatin (CRESTOR) 40 MG tablet TAKE 1 TABLET AT BEDTIME 90 tablet 3   sertraline (ZOLOFT) 50 MG tablet Take 50 mg by mouth at bedtime.      fluticasone (FLONASE) 50 MCG/ACT nasal spray Place 1 spray into both nostrils daily as needed for allergies.  (Patient not taking: Reported on 10/05/2021)     isosorbide dinitrate (ISORDIL) 30 MG tablet Take by mouth as directed. TAKE 1/2 TABLET DAILY (Patient not taking: Reported on 10/05/2021)     No facility-administered  medications prior to visit.     Allergies:   Codeine, Erythromycin, Levaquin [levofloxacin], and Tramadol   Social History   Socioeconomic History   Marital status: Married    Spouse name: Not on file   Number of children: 4   Years of education: Not on file   Highest education level: Not on file  Occupational History   Occupation: house wife    Occupation: retired    Fish farm manager: [employer]  Tobacco Use   Smoking status: Never   Smokeless tobacco: Never  Vaping Use   Vaping Use: Never used  Substance and Sexual Activity   Alcohol use: No   Drug use: No   Sexual activity: Not Currently  Other Topics Concern   Not on file  Social History Narrative   ** Merged History Encounter **       Social Determinants of Health   Financial Resource Strain: Not on file  Food Insecurity: Not on file  Transportation Needs: Not on file  Physical Activity: Not on file  Stress: Not on file  Social Connections: Not on file     Family History:  The patient's family history includes COPD in her father; CVA in her mother; Coronary artery disease in her father; Diabetes Mellitus II in her maternal grandmother and mother; Heart disease in her mother; Hyperlipidemia in her sister; Hypertension in her father, mother, sister, and sister; Uterine cancer in her sister.   ROS General: Negative; No fevers, chills, or night sweats;  HEENT: Legally blind, no changes in hearing, sinus congestion, difficulty swallowing Pulmonary: Negative; No cough, wheezing, shortness of breath, hemoptysis Cardiovascular: Negative; No chest pain, presyncope, syncope, palpitations GI: Negative; No nausea, vomiting, diarrhea, or abdominal pain GU: Negative; No dysuria, hematuria, or difficulty voiding Musculoskeletal: Negative; no myalgias, joint pain, or weakness Hematologic/Oncology: Recent abdominal wall hematoma secondary to blunt trauma while on aspirin/Brilinta. Endocrine: Negative; no heat/cold  intolerance; no diabetes Neuro: Negative; no changes in balance, headaches Skin: Negative; No rashes or skin lesions Psychiatric: Negative; No behavioral problems, depression Sleep: Negative;  No snoring, daytime sleepiness, hypersomnolence, bruxism, restless legs, hypnogognic hallucinations, no cataplexy Other comprehensive 14 point system review is negative.   PHYSICAL EXAM:   VS:  BP 140/80    Pulse 64    Ht 5' (1.524 m)    Wt 119 lb 9.6 oz (54.3 kg)    SpO2 97%    BMI 23.36 kg/m     Repeat blood pressure by me was 134/86.  Wt Readings from Last 3 Encounters:  10/05/21 119 lb 9.6 oz (54.3 kg)  06/04/21 119 lb (54 kg)  05/26/21 119 lb (54 kg)    General: Alert, oriented, no distress.  Skin: normal turgor, no rashes, warm and dry HEENT: Normocephalic, atraumatic.  Nose without nasal septal hypertrophy Mouth/Parynx benign;  Neck: No JVD, no carotid bruits; normal carotid upstroke Lungs: clear to ausculatation and percussion; no wheezing or rales Chest wall: without tenderness to palpitation Heart: PMI not displaced, RRR, s1 s2 normal, 1/6 systolic murmur, no diastolic murmur, no rubs, gallops, thrills, or heaves Abdomen: Residual area of left residual firmness at the site of where her abdomen hit the corner of the countertop; no hepatosplenomehaly, BS+; abdominal aorta nontender and not dilated by palpation. Back: no CVA tenderness Pulses 2+ Musculoskeletal: full range of motion, normal strength, no joint deformities Extremities: Left hand ganglion cyst;  no clubbing cyanosis or edema, Homan's sign negative  Neurologic: grossly nonfocal; Cranial nerves grossly wnl Psychologic: Normal mood and affect   Studies/Labs Reviewed:   ECG (independently read by me): NSR at 64; no ST changes, no ectopr   September 11, 2020 ECG (independently read by me): NSR at 70, no ectopy  October 2021 ECG (independently read by me): NSR at 64, no ST changes, no ectopy, normal intervals  Recent  Labs: BMP Latest Ref Rng & Units 03/01/2021 03/01/2021 05/16/2020  Glucose 70 - 99 mg/dL 125(H) 125(H) 148(H)  BUN 8 - 23 mg/dL $Remove'23 23 23  'gRCOVnk$ Creatinine 0.44 - 1.00 mg/dL 1.80(H) 1.91(H) 2.07(H)  Sodium 135 - 145 mmol/L 130(L) 128(L) 137  Potassium 3.5 - 5.1 mmol/L 4.4 4.4 4.4  Chloride 98 - 111 mmol/L 98 97(L) 106  CO2 22 - 32 mmol/L - 21(L) 22  Calcium 8.9 - 10.3 mg/dL - 9.2 8.7(L)     Hepatic Function Latest Ref Rng & Units 03/01/2021 05/13/2020 12/30/2019  Total Protein 6.5 - 8.1 g/dL 6.6 5.9(L) 6.4(L)  Albumin 3.5 - 5.0 g/dL 3.9 3.4(L) 3.5  AST 15 - 41 U/L 38 43(H) 53(H)  ALT 0 - 44 U/L 50(H) 54(H) 51(H)  Alk Phosphatase 38 - 126 U/L 62 48 31(L)  Total Bilirubin 0.3 - 1.2 mg/dL 0.5 0.6 0.4  Bilirubin, Direct 0.0 - 0.2 mg/dL - - 0.1    CBC Latest Ref Rng & Units 03/01/2021 03/01/2021 05/16/2020  WBC 4.0 - 10.5 K/uL - 6.9 9.9  Hemoglobin 12.0 - 15.0 g/dL 11.9(L) 11.5(L) 8.3(L)  Hematocrit 36.0 - 46.0 % 35.0(L) 34.8(L) 24.9(L)  Platelets 150 - 400 K/uL - 259 188   Lab Results  Component Value Date   MCV 93.0 03/01/2021   MCV 94.0 05/16/2020   MCV 93.2 05/15/2020   Lab Results  Component Value Date   TSH 0.203 (L) 12/30/2019   Lab Results  Component Value Date   HGBA1C 7.4 (H) 05/14/2020     BNP    Component Value Date/Time   BNP 15.6 09/21/2017 2244    ProBNP No results found for: PROBNP   Lipid Panel  Component Value Date/Time   CHOL 116 12/31/2019 0325   TRIG 216 (H) 12/31/2019 0325   HDL 26 (L) 12/31/2019 0325   CHOLHDL 4.5 12/31/2019 0325   VLDL 43 (H) 12/31/2019 0325   LDLCALC 47 12/31/2019 0325     RADIOLOGY: No results found.   Additional studies/ records that were reviewed today include:  Prox LAD lesion is 30% stenosed. 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 Post intervention, there is a 5% residual stenosis.   Mild nonobstructive stenosis of 30% in the proximal LAD in the region of the first diagonal and septal perforating artery.   Normal ramus  intermediate and left circumflex coronary arteries.   Dominant RCA with total mid distal occlusion of the PDA vessel.   LVEDP 22 mmHg.   RECOMMENDATION: DAPT initially but since no stenting was performed duration may be less than 1 year.  Will DC bivalirudin several hours post procedure.  Hydrate.  Aggressive lipid-lowering therapy with target LDL less than 70 and optimal blood pressure control.    Intervention     ASSESSMENT:    No diagnosis found.   PLAN:  Ms. Anasha Perfecto is a 71 year old female who developed an acute coronary syndrome on December 30, 2019 and was found to have total mid distal occlusion of the PDA branch of her right coronary artery.  Due to the vessel caliber, stenting was not done but she underwent successful PTCA.  When I saw her for my initial evaluation with me in October 2021 he was continuing to feel well was on a regimen consisting of  isosorbide at 15 mg in addition to carvedilol which is supposed to be 6.25 mg twice a day but apparently she has only been taking often as a daily dose.  During that evaluation I recommended changing carvedilol to metoprolol succinate and started her at low-dose of 12.5 mg.  She was doing well on aspirin/Brilinta until she had hard blunt trauma to her lower left abdominal wall when she hit a countertop.  This resulted in development of a significant abdominal wall hematoma and she required 2 units of packed red blood cell transfusion.  Her hemoglobin is now stabilized.  She has been off Brilinta since that time particularly since she had not been stented.  Her blood pressure today is mildly elevated.  I have recommended slight titration of metoprolol to 25 mg daily.  She will continue with her current regimen of low-dose isosorbide.  She continues to be on aspirin alone and is tolerating this well.  Her hemoglobin has risen to 13.2.  Her primary physician Dr. Brigitte Pulse recheck laboratory in January 2022.  LDL was excellent at 29.  She is  diabetic on glipizide in addition to [place].  She is on levothyroxine for hypothyroidism and pantoprazole for GERD.  She has issues with the left ganglion cyst.  Cardiac wise she is doing fairly well.  I will see her in 6 months for reevaluation or sooner as needed.     Medication Adjustments/Labs and Tests Ordered: Current medicines are reviewed at length with the patient today.  Concerns regarding medicines are outlined above.  Medication changes, Labs and Tests ordered today are listed in the Patient Instructions below. There are no Patient Instructions on file for this visit.   Signed, Shelva Majestic, MD  10/05/2021 4:21 PM    [facility] [address], [place], [place], [place]  [address] Phone: [phone]

## 2021-10-05 NOTE — Patient Instructions (Signed)

## 2021-10-06 DIAGNOSIS — N184 Chronic kidney disease, stage 4 (severe): Secondary | ICD-10-CM | POA: Diagnosis not present

## 2021-10-06 DIAGNOSIS — Z1231 Encounter for screening mammogram for malignant neoplasm of breast: Secondary | ICD-10-CM | POA: Diagnosis not present

## 2021-10-11 ENCOUNTER — Encounter: Payer: Self-pay | Admitting: Cardiovascular Disease

## 2021-10-12 ENCOUNTER — Other Ambulatory Visit (HOSPITAL_COMMUNITY): Payer: Self-pay

## 2021-10-13 ENCOUNTER — Ambulatory Visit (HOSPITAL_COMMUNITY)
Admission: RE | Admit: 2021-10-13 | Discharge: 2021-10-13 | Disposition: A | Discharge status: Home / Self Care | Payer: Medicare HMO | Source: Ambulatory Visit | Attending: Internal Medicine | Admitting: Internal Medicine

## 2021-10-13 DIAGNOSIS — N184 Chronic kidney disease, stage 4 (severe): Secondary | ICD-10-CM | POA: Diagnosis not present

## 2021-10-13 DIAGNOSIS — M810 Age-related osteoporosis without current pathological fracture: Secondary | ICD-10-CM | POA: Insufficient documentation

## 2021-10-13 DIAGNOSIS — R7989 Other specified abnormal findings of blood chemistry: Secondary | ICD-10-CM | POA: Diagnosis not present

## 2021-10-13 DIAGNOSIS — E1122 Type 2 diabetes mellitus with diabetic chronic kidney disease: Secondary | ICD-10-CM | POA: Diagnosis not present

## 2021-10-13 DIAGNOSIS — E785 Hyperlipidemia, unspecified: Secondary | ICD-10-CM | POA: Diagnosis not present

## 2021-10-13 DIAGNOSIS — I129 Hypertensive chronic kidney disease with stage 1 through stage 4 chronic kidney disease, or unspecified chronic kidney disease: Secondary | ICD-10-CM | POA: Diagnosis not present

## 2021-10-13 DIAGNOSIS — I213 ST elevation (STEMI) myocardial infarction of unspecified site: Secondary | ICD-10-CM | POA: Diagnosis not present

## 2021-10-13 DIAGNOSIS — N2581 Secondary hyperparathyroidism of renal origin: Secondary | ICD-10-CM | POA: Diagnosis not present

## 2021-10-13 MED ORDER — DENOSUMAB 60 MG/ML ~~LOC~~ SOSY
PREFILLED_SYRINGE | SUBCUTANEOUS | Status: AC
  Filled 2021-10-13: qty 1

## 2021-10-13 MED ORDER — DENOSUMAB 60 MG/ML ~~LOC~~ SOSY
60.0000 mg | PREFILLED_SYRINGE | Freq: Once | SUBCUTANEOUS | Status: AC
  Administered 2021-10-13: 60 mg via SUBCUTANEOUS

## 2021-10-23 DIAGNOSIS — I2510 Atherosclerotic heart disease of native coronary artery without angina pectoris: Secondary | ICD-10-CM | POA: Diagnosis not present

## 2021-10-23 DIAGNOSIS — I10 Essential (primary) hypertension: Secondary | ICD-10-CM | POA: Diagnosis not present

## 2021-10-23 DIAGNOSIS — N184 Chronic kidney disease, stage 4 (severe): Secondary | ICD-10-CM | POA: Diagnosis not present

## 2021-10-23 DIAGNOSIS — E785 Hyperlipidemia, unspecified: Secondary | ICD-10-CM | POA: Diagnosis not present

## 2021-11-16 DIAGNOSIS — E1129 Type 2 diabetes mellitus with other diabetic kidney complication: Secondary | ICD-10-CM | POA: Diagnosis not present

## 2021-11-16 DIAGNOSIS — B351 Tinea unguium: Secondary | ICD-10-CM | POA: Diagnosis not present

## 2021-11-16 DIAGNOSIS — L03032 Cellulitis of left toe: Secondary | ICD-10-CM | POA: Diagnosis not present

## 2021-11-24 ENCOUNTER — Ambulatory Visit: Payer: Medicare HMO | Admitting: Podiatry

## 2021-11-24 DIAGNOSIS — M79675 Pain in left toe(s): Secondary | ICD-10-CM | POA: Diagnosis not present

## 2021-11-24 DIAGNOSIS — M205X2 Other deformities of toe(s) (acquired), left foot: Secondary | ICD-10-CM | POA: Diagnosis not present

## 2021-11-24 DIAGNOSIS — L84 Corns and callosities: Secondary | ICD-10-CM | POA: Diagnosis not present

## 2021-11-26 NOTE — Progress Notes (Signed)
Subjective:  ? ?Patient ID: Jessica [name], female   DOB: 71 y.o.   MRN: [id number]  ? ?HPI ?71 year old female presents the office today with her husband for concerns of possible infection of the left fifth toe.  She states that she previously saw her primary care doctor and was prescribed doxycycline for paronychia.  She states that it did help but she still having tenderness to the area.  Currently denies any drainage.  This started about 3 weeks ago. ? ? ?Review of Systems  ?All other systems reviewed and are negative. ? ?Past Medical History:  ?Diagnosis Date  ? Anemia   ? Anxiety   ? CKD (chronic kidney disease)   ? CKD III per PCP notes-pt denies  ? Congenital blindness   ? Diabetes mellitus   ? Diverticula of small intestine   ? Esophageal stricture   ? GERD (gastroesophageal reflux disease)   ? H/O hiatal hernia   ? Hyperlipidemia   ? Hypertension   ? Hypothyroidism   ? Iron deficiency anemia   ? Low magnesium level   ? Low sodium levels   ? Myocardial infarct [facility])   ? Osteopenia   ? Osteoporosis   ? Postoperative nausea and vomiting   ? Renal disease   ? Retinitis   ? STEMI (ST elevation myocardial infarction) ([place])   ? SVT (supraventricular tachycardia) ([place])   ? Vitamin B 12 deficiency   ? ? ?Past Surgical History:  ?Procedure Laterality Date  ? BACK SURGERY    ? CARPAL TUNNEL RELEASE    ? Right   ? CESAREAN SECTION    ? CHOLECYSTECTOMY  1985  ? COLONOSCOPY    ? CORONARY BALLOON ANGIOPLASTY N/A 12/30/2019  ? Procedure: CORONARY BALLOON ANGIOPLASTY;  Surgeon: Troy Sine, MD;  Location: [facility];  Service: Cardiovascular;  Laterality: N/A;  ? CORONARY/GRAFT ACUTE MI REVASCULARIZATION N/A 12/30/2019  ? Procedure: Coronary/Graft Acute MI Revascularization;  Surgeon: Troy Sine, MD;  Location: [facility];  Service: Cardiovascular;  Laterality: N/A;  ? HERNIA REPAIR    ? LEFT HEART CATH AND CORONARY ANGIOGRAPHY N/A 12/30/2019  ? Procedure: LEFT HEART CATH AND CORONARY ANGIOGRAPHY;   Surgeon: Troy Sine, MD;  Location: [facility];  Service: Cardiovascular;  Laterality: N/A;  ? Right knee arthroscopy  1993  ? Right thyroidectomy  2007  ? TOTAL ABDOMINAL HYSTERECTOMY W/ BILATERAL SALPINGOOPHORECTOMY  1993  ? UPPER GASTROINTESTINAL ENDOSCOPY    ? ? ? ?Current Outpatient Medications:  ?  acetaminophen (TYLENOL) 325 MG tablet, Take 2 tablets (650 mg total) by mouth every 4 (four) hours as needed for mild pain., Disp: , Rfl:  ?  aspirin EC 81 MG tablet, Take 1 tablet (81 mg total) by mouth daily. Swallow whole., Disp: 30 tablet, Rfl: 11 ?  Coenzyme Q10 (CO Q 10) 100 MG CAPS, Take 100 mg by mouth daily. , Disp: , Rfl:  ?  cyanocobalamin (,VITAMIN B-12,) 1000 MCG/ML injection, Inject 1,000 mcg into the muscle every 30 (thirty) days., Disp: , Rfl:  ?  Dulaglutide (TRULICITY) 1.5 YI/5.0YD SOPN, Inject into the skin once a week., Disp: , Rfl:  ?  empagliflozin (JARDIANCE) 10 MG TABS tablet, Take 1 tablet (10 mg total) by mouth daily., Disp: 30 tablet, Rfl: 6 ?  famotidine (PEPCID) 40 MG tablet, Take 1 tablet (40 mg total) by mouth 2 (two) times daily., Disp: 180 tablet, Rfl: 3 ?  fenofibrate (TRICOR) 145 MG tablet, Take  1 tablet (145 mg total) by mouth daily., Disp: 30 tablet, Rfl: 6 ?  levothyroxine (SYNTHROID) 50 MCG tablet, Take 50 mcg by mouth daily before breakfast. 1 Tablet Daily Before Breakfast, Disp: , Rfl:  ?  LORazepam (ATIVAN) 0.5 MG tablet, Take 0.5 mg by mouth See admin instructions. Take one tablet (0.5 mg) by mouth daily at bedtime, may also take one tablet (0.5 mg) during the day as needed for anxiety., Disp: , Rfl:  ?  magnesium oxide (MAG-OX) 400 MG tablet, Take 400 mg by mouth daily., Disp: , Rfl:  ?  metoprolol succinate (TOPROL-XL) 25 MG 24 hr tablet, Take 2 tablets (50 mg total) by mouth daily., Disp: 30 tablet, Rfl: 6 ?  nitroGLYCERIN (NITROSTAT) 0.4 MG SL tablet, Place 1 tablet (0.4 mg total) under the tongue every 5 (five) minutes x 3 doses as needed for chest pain.,  Disp: 25 tablet, Rfl: 12 ?  pantoprazole (PROTONIX) 40 MG tablet, Take 40 mg by mouth daily., Disp: , Rfl:  ?  rosuvastatin (CRESTOR) 40 MG tablet, TAKE 1 TABLET AT BEDTIME, Disp: 90 tablet, Rfl: 3 ?  sertraline (ZOLOFT) 50 MG tablet, Take 50 mg by mouth at bedtime. , Disp: , Rfl:  ? ?Allergies  ?Allergen Reactions  ? Codeine Shortness Of Breath and Nausea And Vomiting  ? Erythromycin Nausea And Vomiting  ? Levaquin [Levofloxacin] Shortness Of Breath  ? Tramadol Nausea And Vomiting  ? ? ? ? ?   ?Objective:  ?Physical Exam  ?General: AAO x3, NAD ? ?Dermatological: On the left fifth toe there is localized edema and erythema.  There is no warmth, erythema is blanchable.  I think this is more from inflammation.  There is thick hyperkeratotic tissue consistent with a listers corn just adjacent to the toenail.  Upon debridement there is no underlying ulceration drainage or any signs of infection. ? ?Vascular: Dorsalis Pedis artery and Posterior Tibial artery pedal pulses are 2/4 bilateral with immedate capillary fill time.  There is no pain with calf compression, swelling, warmth, erythema.  ? ?Neruologic: Grossly intact via light touch bilateral.  ? ?Musculoskeletal: Adductovarus of the fifth toe noted.  Tenderness palpation directly over the hyperkeratotic lesion.  No other areas of discomfort. ?Gait: Unassisted, Nonantalgic.  ? ? ?   ?Assessment:  ? 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 ?   ?Plan:  ?-Treatment options discussed including all alternatives, risks, and complications ?-Etiology of symptoms were discussed ?-I sharply debrided the hyperkeratotic lesion to any complications or bleeding.  Appears to be more inflamed as opposed to infected.  There is no purulence or signs of an abscess today.  I dispensed offloading pads to help decrease pressure to the area. ?-After debridement the pain improved.  ?-Monitor for any clinical signs or symptoms of infection and directed to call the office immediately should  any occur or go to the ER. ?-If symptoms persist recommend x-ray ? ?Return in about 6 weeks (around 01/05/2022) for listers corn, infection left 5th toe. ? ?Trula Slade DPM ? ?   ? ?

## 2022-01-05 ENCOUNTER — Ambulatory Visit: Payer: Medicare HMO | Admitting: Podiatry

## 2022-01-12 DIAGNOSIS — H25813 Combined forms of age-related cataract, bilateral: Secondary | ICD-10-CM | POA: Diagnosis not present

## 2022-01-12 DIAGNOSIS — H3552 Pigmentary retinal dystrophy: Secondary | ICD-10-CM | POA: Diagnosis not present

## 2022-01-19 DIAGNOSIS — E785 Hyperlipidemia, unspecified: Secondary | ICD-10-CM | POA: Diagnosis not present

## 2022-01-19 DIAGNOSIS — E538 Deficiency of other specified B group vitamins: Secondary | ICD-10-CM | POA: Diagnosis not present

## 2022-01-19 DIAGNOSIS — M8580 Other specified disorders of bone density and structure, unspecified site: Secondary | ICD-10-CM | POA: Diagnosis not present

## 2022-01-19 DIAGNOSIS — E1129 Type 2 diabetes mellitus with other diabetic kidney complication: Secondary | ICD-10-CM | POA: Diagnosis not present

## 2022-01-19 DIAGNOSIS — N184 Chronic kidney disease, stage 4 (severe): Secondary | ICD-10-CM | POA: Diagnosis not present

## 2022-01-19 DIAGNOSIS — I700 Atherosclerosis of aorta: Secondary | ICD-10-CM | POA: Diagnosis not present

## 2022-01-19 DIAGNOSIS — E039 Hypothyroidism, unspecified: Secondary | ICD-10-CM | POA: Diagnosis not present

## 2022-01-19 DIAGNOSIS — I129 Hypertensive chronic kidney disease with stage 1 through stage 4 chronic kidney disease, or unspecified chronic kidney disease: Secondary | ICD-10-CM | POA: Diagnosis not present

## 2022-01-19 DIAGNOSIS — I2510 Atherosclerotic heart disease of native coronary artery without angina pectoris: Secondary | ICD-10-CM | POA: Diagnosis not present

## 2022-01-21 ENCOUNTER — Emergency Department (HOSPITAL_COMMUNITY)
Admission: EM | Admit: 2022-01-21 | Discharge: 2022-01-22 | Discharge status: Against Medical Advice | Payer: Medicare HMO | Attending: Emergency Medicine | Admitting: Emergency Medicine

## 2022-01-21 ENCOUNTER — Other Ambulatory Visit: Payer: Self-pay

## 2022-01-21 ENCOUNTER — Encounter (HOSPITAL_COMMUNITY): Payer: Self-pay

## 2022-01-21 DIAGNOSIS — Z5321 Procedure and treatment not carried out due to patient leaving prior to being seen by health care provider: Secondary | ICD-10-CM | POA: Insufficient documentation

## 2022-01-21 DIAGNOSIS — R110 Nausea: Secondary | ICD-10-CM | POA: Diagnosis present

## 2022-01-21 DIAGNOSIS — I10 Essential (primary) hypertension: Secondary | ICD-10-CM | POA: Insufficient documentation

## 2022-01-21 DIAGNOSIS — Z79899 Other long term (current) drug therapy: Secondary | ICD-10-CM | POA: Insufficient documentation

## 2022-01-21 LAB — URINALYSIS, ROUTINE W REFLEX MICROSCOPIC
Bacteria, UA: NONE SEEN
Bilirubin Urine: NEGATIVE
Glucose, UA: >=500 mg/dL — AB
Hgb urine dipstick: NEGATIVE
Ketones, ur: NEGATIVE mg/dL
Leukocytes,Ua: NEGATIVE
Nitrite: NEGATIVE
Protein, ur: NEGATIVE mg/dL
Specific Gravity, Urine: 1.002 — ABNORMAL LOW (ref 1.005–1.030)
pH: 7 (ref 5.0–8.0)

## 2022-01-21 LAB — CBC
HCT: 37.9 % (ref 36.0–46.0)
Hemoglobin: 12.9 g/dL (ref 12.0–15.0)
MCH: 31.9 pg (ref 26.0–34.0)
MCHC: 34 g/dL (ref 30.0–36.0)
MCV: 93.6 fL (ref 80.0–100.0)
Platelets: 235 10*3/uL (ref 150–400)
RBC: 4.05 MIL/uL (ref 3.87–5.11)
RDW: 12.2 % (ref 11.5–15.5)
WBC: 12.1 10*3/uL — ABNORMAL HIGH (ref 4.0–10.5)
nRBC: 0 % (ref 0.0–0.2)

## 2022-01-21 LAB — CBG MONITORING, ED: Glucose-Capillary: 205 mg/dL — ABNORMAL HIGH (ref 70–99)

## 2022-01-21 LAB — BASIC METABOLIC PANEL
Anion gap: 14 (ref 5–15)
BUN: 26 mg/dL — ABNORMAL HIGH (ref 8–23)
CO2: 21 mmol/L — ABNORMAL LOW (ref 22–32)
Calcium: 9.8 mg/dL (ref 8.9–10.3)
Chloride: 98 mmol/L (ref 98–111)
Creatinine, Ser: 1.94 mg/dL — ABNORMAL HIGH (ref 0.44–1.00)
GFR, Estimated: 27 mL/min — ABNORMAL LOW (ref >60)
Glucose, Bld: 201 mg/dL — ABNORMAL HIGH (ref 70–99)
Potassium: 4.7 mmol/L (ref 3.5–5.1)
Sodium: 133 mmol/L — ABNORMAL LOW (ref 135–145)

## 2022-01-21 NOTE — ED Triage Notes (Addendum)
Pt reports she was laying down listening to an audiobook when suddenly she "felt funny" and became nauseous. She took her BP and it was elevated, highest at home tonight was 159/101. She took '25mg'$  of Metoprolol.   Pt is blind.

## 2022-01-22 NOTE — ED Notes (Signed)
Pt husband stated they have to leave because they are both blind and their driver is ready to go.

## 2022-02-02 IMAGING — DX DG CHEST 1V PORT
1 series · 1 of 1 positions shown · non-contrast
Comparison: Chest radiograph dated 01/13/2020.

CLINICAL DATA: 68-year-old female with trauma.

EXAM:
PORTABLE CHEST 1 VIEW

[chest]
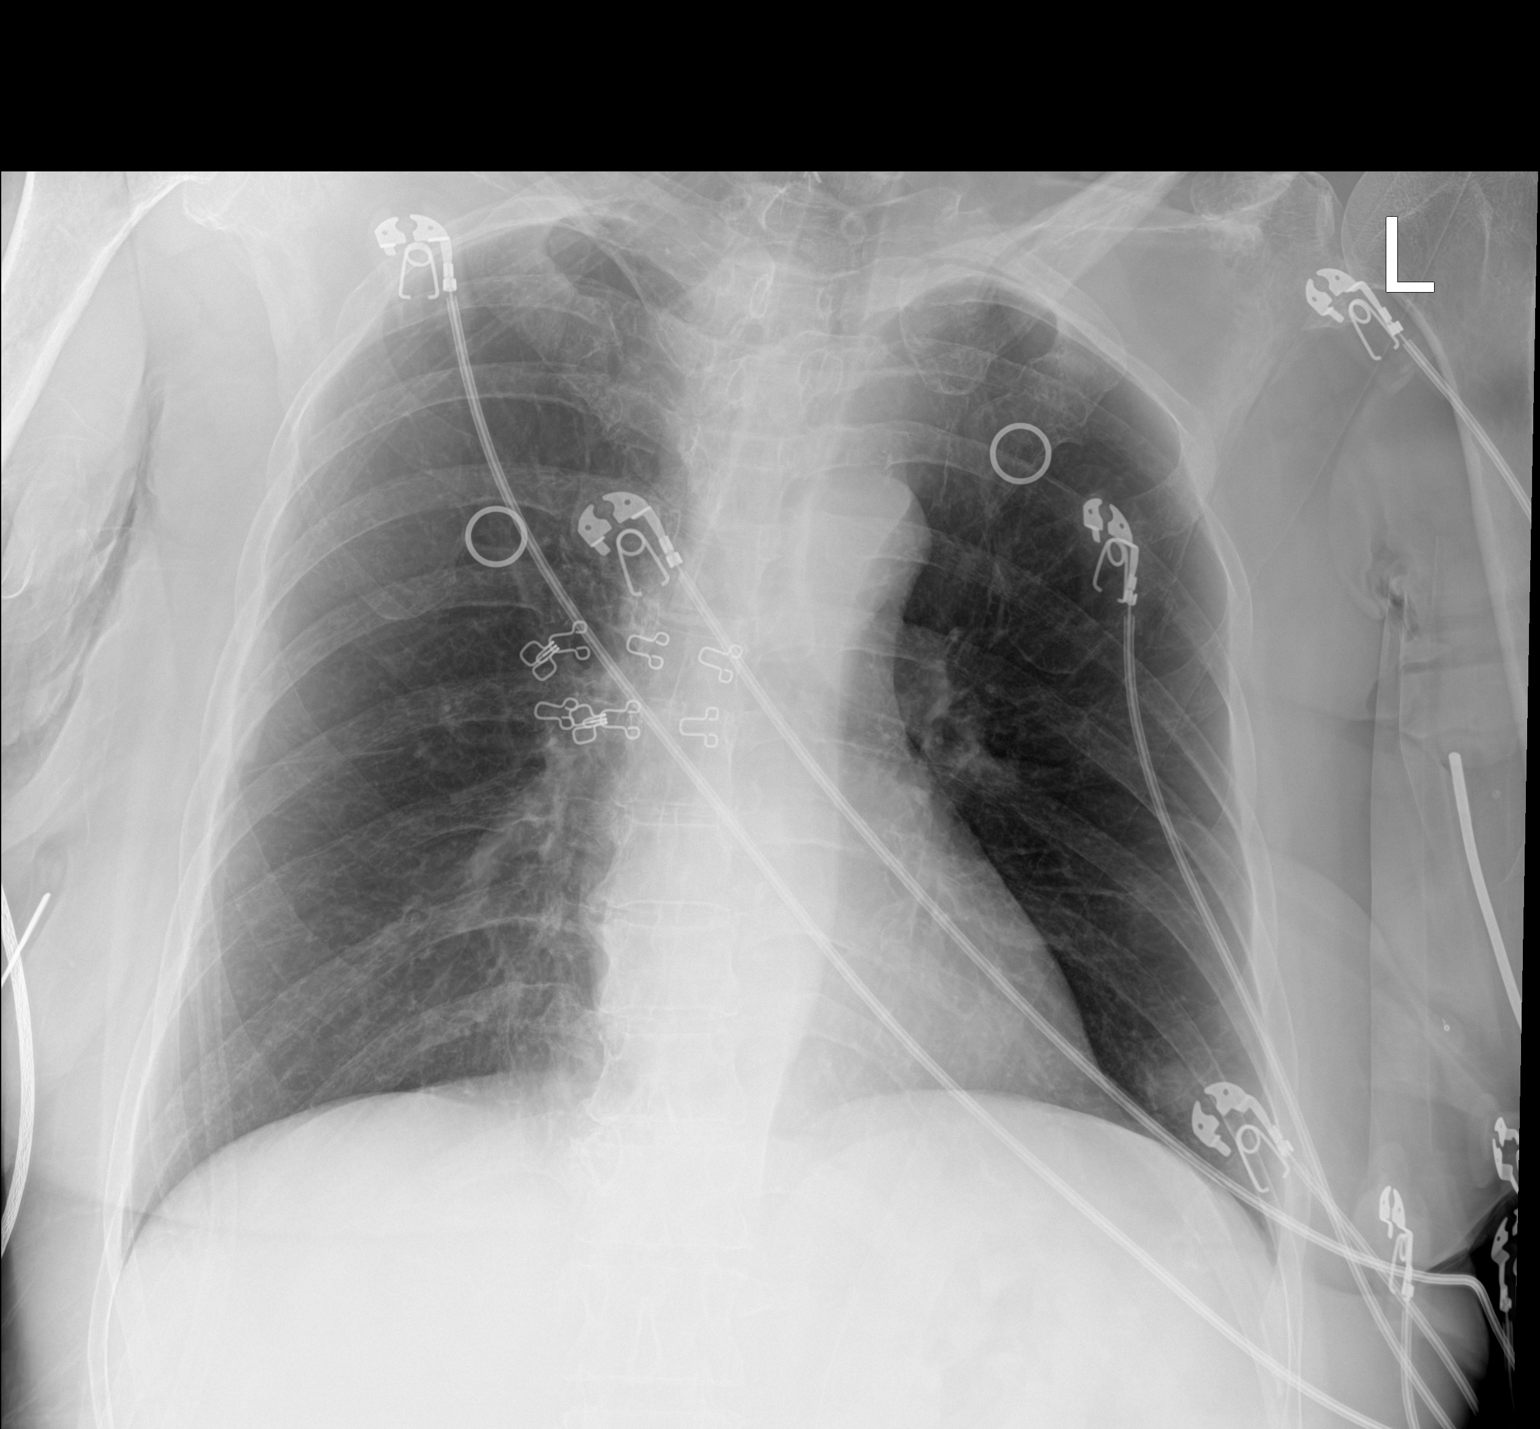

[1 of 1 positions shown; findings below may reference images not displayed]

FINDINGS: No focal consolidation, pleural effusion, or pneumothorax. The
cardiac silhouette is within limits. No acute osseous pathology.
IMPRESSION: No active disease.

## 2022-02-21 ENCOUNTER — Telehealth: Payer: Self-pay | Admitting: Internal Medicine

## 2022-02-21 NOTE — Telephone Encounter (Signed)
Received phone call from patient due to concern for elevated blood pressures, initially 170/100.  She took extra metoprolol XL and repeat blood pressures were improved at around 144/96.  She notes that throughout the evening she has felt a little shaky and cold, but no other major symptoms.  Notes that she has had issues with blood pressure control over the last 2 to 3 weeks this is really the first time she has had major issues with her blood pressure.  I let her know that if her symptoms worsen, she should come to the emergency department for evaluation.  Otherwise she denies any clinic and I will route this note to Dr. Claiborne Billings.  Billey Chang, MD Cardiology

## 2022-03-08 ENCOUNTER — Observation Stay (HOSPITAL_COMMUNITY)
Admission: EM | Admit: 2022-03-08 | Discharge: 2022-03-10 | Disposition: A | Discharge status: Home / Self Care | Payer: Medicare HMO | Attending: Internal Medicine | Admitting: Internal Medicine

## 2022-03-08 ENCOUNTER — Emergency Department (HOSPITAL_COMMUNITY): Payer: Medicare HMO

## 2022-03-08 ENCOUNTER — Encounter (HOSPITAL_COMMUNITY): Payer: Self-pay | Admitting: Family Medicine

## 2022-03-08 ENCOUNTER — Other Ambulatory Visit: Payer: Self-pay

## 2022-03-08 DIAGNOSIS — F419 Anxiety disorder, unspecified: Secondary | ICD-10-CM | POA: Diagnosis present

## 2022-03-08 DIAGNOSIS — Z7982 Long term (current) use of aspirin: Secondary | ICD-10-CM | POA: Diagnosis not present

## 2022-03-08 DIAGNOSIS — I2510 Atherosclerotic heart disease of native coronary artery without angina pectoris: Secondary | ICD-10-CM | POA: Diagnosis not present

## 2022-03-08 DIAGNOSIS — I10 Essential (primary) hypertension: Secondary | ICD-10-CM | POA: Diagnosis present

## 2022-03-08 DIAGNOSIS — R002 Palpitations: Principal | ICD-10-CM | POA: Diagnosis present

## 2022-03-08 DIAGNOSIS — R778 Other specified abnormalities of plasma proteins: Secondary | ICD-10-CM | POA: Diagnosis not present

## 2022-03-08 DIAGNOSIS — E039 Hypothyroidism, unspecified: Secondary | ICD-10-CM | POA: Diagnosis not present

## 2022-03-08 DIAGNOSIS — Z79899 Other long term (current) drug therapy: Secondary | ICD-10-CM | POA: Insufficient documentation

## 2022-03-08 DIAGNOSIS — I129 Hypertensive chronic kidney disease with stage 1 through stage 4 chronic kidney disease, or unspecified chronic kidney disease: Secondary | ICD-10-CM | POA: Diagnosis not present

## 2022-03-08 DIAGNOSIS — E118 Type 2 diabetes mellitus with unspecified complications: Secondary | ICD-10-CM | POA: Diagnosis not present

## 2022-03-08 DIAGNOSIS — Z955 Presence of coronary angioplasty implant and graft: Secondary | ICD-10-CM | POA: Insufficient documentation

## 2022-03-08 DIAGNOSIS — Z7985 Long-term (current) use of injectable non-insulin antidiabetic drugs: Secondary | ICD-10-CM | POA: Insufficient documentation

## 2022-03-08 DIAGNOSIS — M6281 Muscle weakness (generalized): Secondary | ICD-10-CM | POA: Insufficient documentation

## 2022-03-08 DIAGNOSIS — N184 Chronic kidney disease, stage 4 (severe): Secondary | ICD-10-CM | POA: Diagnosis present

## 2022-03-08 DIAGNOSIS — E1122 Type 2 diabetes mellitus with diabetic chronic kidney disease: Secondary | ICD-10-CM | POA: Diagnosis not present

## 2022-03-08 DIAGNOSIS — R0989 Other specified symptoms and signs involving the circulatory and respiratory systems: Secondary | ICD-10-CM | POA: Diagnosis not present

## 2022-03-08 DIAGNOSIS — Z7984 Long term (current) use of oral hypoglycemic drugs: Secondary | ICD-10-CM | POA: Diagnosis not present

## 2022-03-08 DIAGNOSIS — Z9049 Acquired absence of other specified parts of digestive tract: Secondary | ICD-10-CM | POA: Diagnosis not present

## 2022-03-08 DIAGNOSIS — R531 Weakness: Secondary | ICD-10-CM | POA: Insufficient documentation

## 2022-03-08 DIAGNOSIS — R110 Nausea: Secondary | ICD-10-CM | POA: Diagnosis not present

## 2022-03-08 DIAGNOSIS — R7401 Elevation of levels of liver transaminase levels: Secondary | ICD-10-CM

## 2022-03-08 DIAGNOSIS — E785 Hyperlipidemia, unspecified: Secondary | ICD-10-CM | POA: Diagnosis present

## 2022-03-08 LAB — COMPREHENSIVE METABOLIC PANEL
ALT: 52 U/L — ABNORMAL HIGH (ref 0–44)
AST: 43 U/L — ABNORMAL HIGH (ref 15–41)
Albumin: 4.1 g/dL (ref 3.5–5.0)
Alkaline Phosphatase: 43 U/L (ref 38–126)
Anion gap: 9 (ref 5–15)
BUN: 15 mg/dL (ref 8–23)
CO2: 23 mmol/L (ref 22–32)
Calcium: 9.3 mg/dL (ref 8.9–10.3)
Chloride: 102 mmol/L (ref 98–111)
Creatinine, Ser: 1.77 mg/dL — ABNORMAL HIGH (ref 0.44–1.00)
GFR, Estimated: 30 mL/min — ABNORMAL LOW (ref >60)
Glucose, Bld: 154 mg/dL — ABNORMAL HIGH (ref 70–99)
Potassium: 5.1 mmol/L (ref 3.5–5.1)
Sodium: 134 mmol/L — ABNORMAL LOW (ref 135–145)
Total Bilirubin: 0.5 mg/dL (ref 0.3–1.2)
Total Protein: 7.3 g/dL (ref 6.5–8.1)

## 2022-03-08 LAB — CBC WITH DIFFERENTIAL/PLATELET
Abs Immature Granulocytes: 0.04 10*3/uL (ref 0.00–0.07)
Basophils Absolute: 0.1 10*3/uL (ref 0.0–0.1)
Basophils Relative: 1 %
Eosinophils Absolute: 0.1 10*3/uL (ref 0.0–0.5)
Eosinophils Relative: 1 %
HCT: 38.6 % (ref 36.0–46.0)
Hemoglobin: 13.4 g/dL (ref 12.0–15.0)
Immature Granulocytes: 0 %
Lymphocytes Relative: 16 %
Lymphs Abs: 1.6 10*3/uL (ref 0.7–4.0)
MCH: 32.2 pg (ref 26.0–34.0)
MCHC: 34.7 g/dL (ref 30.0–36.0)
MCV: 92.8 fL (ref 80.0–100.0)
Monocytes Absolute: 0.9 10*3/uL (ref 0.1–1.0)
Monocytes Relative: 9 %
Neutro Abs: 7.1 10*3/uL (ref 1.7–7.7)
Neutrophils Relative %: 73 %
Platelets: 288 10*3/uL (ref 150–400)
RBC: 4.16 MIL/uL (ref 3.87–5.11)
RDW: 12.4 % (ref 11.5–15.5)
WBC: 9.9 10*3/uL (ref 4.0–10.5)
nRBC: 0 % (ref 0.0–0.2)

## 2022-03-08 LAB — URINALYSIS, ROUTINE W REFLEX MICROSCOPIC
Bacteria, UA: NONE SEEN
Bilirubin Urine: NEGATIVE
Glucose, UA: >=500 mg/dL — AB
Hgb urine dipstick: NEGATIVE
Ketones, ur: NEGATIVE mg/dL
Leukocytes,Ua: NEGATIVE
Nitrite: NEGATIVE
Protein, ur: NEGATIVE mg/dL
Specific Gravity, Urine: 1.001 — ABNORMAL LOW (ref 1.005–1.030)
pH: 8 (ref 5.0–8.0)

## 2022-03-08 LAB — TROPONIN I (HIGH SENSITIVITY)
Troponin I (High Sensitivity): 18 ng/L — ABNORMAL HIGH (ref <18)
Troponin I (High Sensitivity): 21 ng/L — ABNORMAL HIGH (ref <18)

## 2022-03-08 LAB — TSH: TSH: 4.248 u[IU]/mL (ref 0.350–4.500)

## 2022-03-08 LAB — LIPASE, BLOOD: Lipase: 54 U/L — ABNORMAL HIGH (ref 11–51)

## 2022-03-08 LAB — MAGNESIUM: Magnesium: 2.2 mg/dL (ref 1.7–2.4)

## 2022-03-08 MED ORDER — ACETAMINOPHEN 650 MG RE SUPP: 650.0000 mg | Freq: Four times a day (QID) | RECTAL | Status: DC | PRN

## 2022-03-08 MED ORDER — METOPROLOL TARTRATE 25 MG PO TABS: 25.0000 mg | ORAL_TABLET | ORAL | Status: DC | PRN

## 2022-03-08 MED ORDER — SERTRALINE HCL 50 MG PO TABS
50.0000 mg | ORAL_TABLET | Freq: Every day | ORAL | Status: DC
  Administered 2022-03-09 (×2): 50 mg via ORAL
  Filled 2022-03-08 (×2): qty 1

## 2022-03-08 MED ORDER — LORAZEPAM 0.5 MG PO TABS
0.5000 mg | ORAL_TABLET | Freq: Every day | ORAL | Status: DC
  Administered 2022-03-09 (×2): 0.5 mg via ORAL
  Filled 2022-03-08 (×2): qty 1

## 2022-03-08 MED ORDER — LEVOTHYROXINE SODIUM 50 MCG PO TABS
50.0000 ug | ORAL_TABLET | Freq: Every day | ORAL | Status: DC
  Administered 2022-03-09 – 2022-03-10 (×2): 50 ug via ORAL
  Filled 2022-03-08: qty 1
  Filled 2022-03-08: qty 2

## 2022-03-08 MED ORDER — HEPARIN SODIUM (PORCINE) 5000 UNIT/ML IJ SOLN
5000.0000 [IU] | Freq: Three times a day (TID) | INTRAMUSCULAR | Status: DC
  Administered 2022-03-09 – 2022-03-10 (×6): 5000 [IU] via SUBCUTANEOUS
  Filled 2022-03-08 (×6): qty 1

## 2022-03-08 MED ORDER — FAMOTIDINE 20 MG PO TABS
20.0000 mg | ORAL_TABLET | Freq: Every day | ORAL | Status: DC
  Administered 2022-03-09 – 2022-03-10 (×2): 20 mg via ORAL
  Filled 2022-03-08 (×2): qty 1

## 2022-03-08 MED ORDER — METOPROLOL SUCCINATE ER 50 MG PO TB24
50.0000 mg | ORAL_TABLET | Freq: Every day | ORAL | Status: DC
  Administered 2022-03-09 – 2022-03-10 (×2): 50 mg via ORAL
  Filled 2022-03-08: qty 1
  Filled 2022-03-08: qty 2

## 2022-03-08 MED ORDER — ONDANSETRON 4 MG PO TBDP
4.0000 mg | ORAL_TABLET | Freq: Once | ORAL | Status: AC
  Administered 2022-03-08: 4 mg via ORAL
  Filled 2022-03-08: qty 1

## 2022-03-08 MED ORDER — ONDANSETRON HCL 4 MG PO TABS: 4.0000 mg | ORAL_TABLET | Freq: Four times a day (QID) | ORAL | Status: DC | PRN

## 2022-03-08 MED ORDER — ACETAMINOPHEN 325 MG PO TABS: 650.0000 mg | ORAL_TABLET | Freq: Four times a day (QID) | ORAL | Status: DC | PRN

## 2022-03-08 MED ORDER — SODIUM CHLORIDE 0.9% FLUSH
3.0000 mL | Freq: Two times a day (BID) | INTRAVENOUS | Status: DC
  Administered 2022-03-09 – 2022-03-10 (×4): 3 mL via INTRAVENOUS

## 2022-03-08 MED ORDER — SENNOSIDES-DOCUSATE SODIUM 8.6-50 MG PO TABS: 1.0000 | ORAL_TABLET | Freq: Every evening | ORAL | Status: DC | PRN

## 2022-03-08 MED ORDER — INSULIN ASPART 100 UNIT/ML IJ SOLN
0.0000 [IU] | Freq: Three times a day (TID) | INTRAMUSCULAR | Status: DC
  Administered 2022-03-10: 1 [IU] via SUBCUTANEOUS

## 2022-03-08 MED ORDER — ONDANSETRON HCL 4 MG/2ML IJ SOLN: 4.0000 mg | Freq: Four times a day (QID) | INTRAMUSCULAR | Status: DC | PRN

## 2022-03-08 MED ORDER — ASPIRIN 81 MG PO TBEC
81.0000 mg | DELAYED_RELEASE_TABLET | Freq: Every day | ORAL | Status: DC
  Administered 2022-03-09 – 2022-03-10 (×2): 81 mg via ORAL
  Filled 2022-03-08 (×2): qty 1

## 2022-03-08 MED ORDER — ROSUVASTATIN CALCIUM 20 MG PO TABS
40.0000 mg | ORAL_TABLET | Freq: Every day | ORAL | Status: DC
  Administered 2022-03-09 (×2): 40 mg via ORAL
  Filled 2022-03-08 (×2): qty 2

## 2022-03-08 MED ORDER — PANTOPRAZOLE SODIUM 40 MG PO TBEC
40.0000 mg | DELAYED_RELEASE_TABLET | Freq: Every day | ORAL | Status: DC
  Administered 2022-03-09 – 2022-03-10 (×2): 40 mg via ORAL
  Filled 2022-03-08 (×2): qty 1

## 2022-03-08 NOTE — ED Provider Triage Note (Signed)
Emergency Medicine Provider Triage Evaluation Note  Jessica [name] , a 71 y.o. female  was evaluated in triage.  Pt complains of palpitations fatigue.  Patient reports that she has a history of SVT and feels that she has been having palpitations intermittently over the last few weeks.  Patient last felt this earlier this morning and is concerned he may have been in SVT.  Patient also reports generalized fatigue and feeling "weak," over the last few weeks as well.  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   Review of Systems  Positive: Fatigue, palpitations, dysuria, nausea Negative: Fever, chills, vomiting, abdominal pain, constipation, diarrhea, urinary urgency, syncope, cough, rhinorrhea, nasal congestion  Physical Exam  BP (!) 178/92 (BP Location: Right Arm)   Pulse 84   Temp 97.9 F (36.6 C) (Oral)   Resp 16   Ht 5' (1.524 m)   Wt 50.8 kg   SpO2 100%   BMI 21.87 kg/m  Gen:   Awake, no distress   Resp:  Normal effort, to auscultation bilaterally MSK:   Moves extremities without difficulty, swelling or tenderness of bilateral lower extremity Other:  Posterior pulse bilaterally.  Abdomen soft, nontender, nontender with no guarding or rebound tenderness.  Medical Decision Making  Medically screening exam initiated at 5:32 PM.  Appropriate orders placed.  PALOMA GRANGE was informed that the remainder of the evaluation will be completed by another provider, this initial triage assessment does not replace that evaluation, and the importance of remaining in the ED until their evaluation is complete.     Loni Beckwith, [place] 03/08/22 1733

## 2022-03-08 NOTE — ED Provider Notes (Signed)
[facility] EMERGENCY DEPARTMENT Provider Note   CSN: [id number] Arrival date & time: 03/08/22  1546     History  Chief Complaint  Patient presents with   Weakness    Jessica [name] is a 71 y.o. female.  Patient is a 71 year old female with a past medical history of CAD, hypothyroidism, hyperlipidemia, GERD presenting to the emergency department with generalized weakness.  The patient states that she has had 3 episodes over the last several weeks where she feels generally weak all over.  She says that she checks her blood pressure at this time and notes that its been running very high in the 267T systolic.  She states that she gets a mild associated headache with nausea.  She denies any vomiting.  She denies any diaphoresis or lightheadedness.  She denies any chest or abdominal pain.  She states that she has chronic constipation but its been no worse than usual.  She states that she feels weak all over and denies any associated numbness or tingling.  She states that these episodes usually last for about an hour at a time.  She states that she has not tried anything for her symptoms.  She states that she has a poor appetite but that this is no worse than her usual.   Weakness      Home Medications Prior to Admission medications   Medication Sig Start Date End Date Taking? Authorizing Provider  acetaminophen (TYLENOL) 325 MG tablet Take 2 tablets (650 mg total) by mouth every 4 (four) hours as needed for mild pain. 05/16/20   Saverio Danker, PA-C  aspirin EC 81 MG tablet Take 1 tablet (81 mg total) by mouth daily. Swallow whole. 06/27/20   Darreld Mclean, PA-C  Coenzyme Q10 (CO Q 10) 100 MG CAPS Take 100 mg by mouth daily.     [provider]  cyanocobalamin (,VITAMIN B-12,) 1000 MCG/ML injection Inject 1,000 mcg into the muscle every 30 (thirty) days. 11/02/19   [provider]  Dulaglutide (TRULICITY) 1.5 IW/5.8KD SOPN Inject into the skin once a  week. 03/20/21   [provider]  empagliflozin (JARDIANCE) 10 MG TABS tablet Take 1 tablet (10 mg total) by mouth daily. 03/25/21   Kroeger, Lorelee Cover., PA-C  famotidine (PEPCID) 40 MG tablet Take 1 tablet (40 mg total) by mouth 2 (two) times daily. 05/26/21   Levin Erp, PA  fenofibrate (TRICOR) 145 MG tablet Take 1 tablet (145 mg total) by mouth daily. 03/25/21   Kroeger, Lorelee Cover., PA-C  levothyroxine (SYNTHROID) 50 MCG tablet Take 50 mcg by mouth daily before breakfast. 1 Tablet Daily Before Breakfast    [provider]  LORazepam (ATIVAN) 0.5 MG tablet Take 0.5 mg by mouth See admin instructions. Take one tablet (0.5 mg) by mouth daily at bedtime, may also take one tablet (0.5 mg) during the day as needed for anxiety.    [provider]  magnesium oxide (MAG-OX) 400 MG tablet Take 400 mg by mouth daily.    [provider]  metoprolol succinate (TOPROL-XL) 25 MG 24 hr tablet Take 2 tablets (50 mg total) by mouth daily. 03/25/21   Kroeger, Lorelee Cover., PA-C  nitroGLYCERIN (NITROSTAT) 0.4 MG SL tablet Place 1 tablet (0.4 mg total) under the tongue every 5 (five) minutes x 3 doses as needed for chest pain. 01/01/20   Bhagat, Crista Luria, PA  pantoprazole (PROTONIX) 40 MG tablet Take 40 mg by mouth daily. 03/22/20   [provider]  rosuvastatin (CRESTOR) 40 MG tablet TAKE 1 TABLET AT BEDTIME 07/21/21   Troy Sine, MD  sertraline (ZOLOFT) 50 MG tablet Take 50 mg by mouth at bedtime.  03/06/20   [provider]      Allergies    Codeine, Erythromycin, Levaquin [levofloxacin], and Tramadol    Review of Systems   Review of Systems  Neurological:  Positive for weakness.    Physical Exam Updated Vital Signs BP (!) 161/91   Pulse 73   Temp 97.9 F (36.6 C) (Oral)   Resp 18   Ht 5' (1.524 m)   Wt 50.8 kg   SpO2 100%   BMI 21.87 kg/m  Physical Exam Vitals and nursing note reviewed.  Constitutional:      General: She is not in  acute distress.    Appearance: Normal appearance.  HENT:     Head: Normocephalic and atraumatic.     Nose: Nose normal.     Mouth/Throat:     Mouth: Mucous membranes are moist.  Eyes:     Extraocular Movements: Extraocular movements intact.     Conjunctiva/sclera: Conjunctivae normal.  Cardiovascular:     Rate and Rhythm: Normal rate and regular rhythm.     Pulses: Normal pulses.     Heart sounds: Normal heart sounds.  Pulmonary:     Effort: Pulmonary effort is normal.     Breath sounds: Normal breath sounds.  Abdominal:     General: Abdomen is flat.     Palpations: Abdomen is soft.     Tenderness: There is no abdominal tenderness.  Musculoskeletal:        General: Normal range of motion.     Cervical back: Normal range of motion and neck supple.     Right lower leg: No edema.     Left lower leg: No edema.  Skin:    General: Skin is warm and dry.  Neurological:     General: No focal deficit present.     Mental Status: She is alert and oriented to person, place, and time.     Sensory: No sensory deficit.     Motor: No weakness.  Psychiatric:        Mood and Affect: Mood normal.        Behavior: Behavior normal.     ED Results / Procedures / Treatments   Labs (all labs ordered are listed, but only abnormal results are displayed) Labs Reviewed  URINE CULTURE  CBC WITH DIFFERENTIAL/PLATELET  TSH  COMPREHENSIVE METABOLIC PANEL  LIPASE, BLOOD  MAGNESIUM  URINALYSIS, ROUTINE W REFLEX MICROSCOPIC  TROPONIN I (HIGH SENSITIVITY)  TROPONIN I (HIGH SENSITIVITY)    EKG EKG Interpretation  Date/Time:  Monday March 08 2022 17:19:08 EDT Ventricular Rate:  83 PR Interval:  172 QRS Duration: 78 QT Interval:  380 QTC Calculation: 446 R Axis:   19 Text Interpretation: Normal sinus rhythm Low voltage QRS Borderline ECG No significant change since last tracing Confirmed by Oneal Deputy [phone]) on 03/08/2022 7:01:10 PM  Radiology DG Chest 2 View  Result Date:  03/08/2022 CLINICAL DATA:  Patient reports that she has a history of SVT and feels that she has been having palpitations intermittently over the last few weeks. Patient last felt this earlier this morning and is concerned she may have been in SVT. EXAM: CHEST - 2 VIEW COMPARISON:  03/01/2021 FINDINGS: Lungs are clear. Heart size and mediastinal contours are within normal limits. No effusion. Visualized bones unremarkable.  Cholecystectomy  clips. IMPRESSION: No acute cardiopulmonary disease. Electronically Signed   By: Lucrezia Europe M.D.   On: 03/08/2022 18:09    Procedures Procedures    Medications Ordered in ED Medications  ondansetron (ZOFRAN-ODT) disintegrating tablet 4 mg (4 mg Oral Given 03/08/22 1817)    ED Course/ Medical Decision Making/ A&P Clinical Course as of 03/08/22 2300  Mon Mar 08, 2022  2200 Patient's labs were reviewed and interpreted by myself show mildly elevated troponin of 21 which has been uptrending as well as mildly elevated LFTs and lipase.  The patient's denies any abdominal pain.  Due to patient's increasing troponin and cardiac risk factors, she will be admitted for troponin trending and further evaluation of her episodic generalized weakness with hypertension. [VK]  2222 Urine reviewed and interpreted by myself with glucosuria but no signs of infection.  Patient's case was discussed with the hospitalist who plans to evaluate the patient and will likely admit for telemetry monitoring followed by outpatient cardiology follow-up for potential Holter monitoring. [VK]    Clinical Course User Index [VK] [place], [place] Decision Making This patient presents to the ED with chief complaint(s) of generalized weakness and hypertension with pertinent past medical history of CAD, hypothyroidism which further complicates the presenting complaint. The complaint involves an extensive differential diagnosis and also carries with it a high risk  of complications and morbidity.    The differential diagnosis includes ACS, arrhythmia, anemia, dehydration, electrolyte abnormality, thyroid dysfunction, CVA unlikely as no focal neurologic deficits on exam or during the event.  Additional history obtained: Additional history obtained from spouse   ED Course and Reassessment: Upon reassessment, the patient remains mildly hypertensive but is not currently feeling the palpitations or weakness.  She has remained in sinus rhythm on the monitor.  Independent labs interpretation:  The following labs were independently interpreted: Troponins mildly elevated, mildly elevated LFTs and lipase, no abdominal symptoms making pancreatitis, cholelithiasis, cholecystitis, hepatitis, choledocholithiasis unlikely causes of her laboratory abnormalities  Independent visualization of imaging: - I independently visualized the following imaging with scope of interpretation limited to determining acute life threatening conditions related to emergency care: Chest x-ray, which revealed no acute disease  Consultation: - Consulted or discussed management/test interpretation w/ external professional: Hospitalist, Dr. Myna Hidalgo  Consideration for admission or further workup: Telemetry monitoring to evaluate for arrhythmias Social Determinants of health: N/A    Risk Decision regarding hospitalization.           Final Clinical Impression(s) / ED Diagnoses Final diagnoses:  None    Rx / DC Orders ED Discharge Orders     None         Ottie Glazier, DO 03/08/22 2301

## 2022-03-08 NOTE — ED Triage Notes (Signed)
BIB EMS from home.  States she has had "palpitations" for several weeks however today feels "weak"  Has been here in the past reportedly but did not wait to be seen.  Pt is blind.

## 2022-03-08 NOTE — H&P (Signed)
History and Physical    KAMANI MAGNUSSEN IRS:[id number] DOB: [birth year] DOA: 03/08/2022  PCP: Ginger Organ., MD   Patient coming from: Home   Chief Complaint: Palpitations, weakness   HPI: Jessica [name] is a pleasant 71 y.o. female with medical history significant for hypertension, hyperlipidemia, CKD 4, anxiety, GERD, and CAD status post balloon angioplasty of RPDA in June 2021, now presenting to the emergency department with recurrent episodes of palpitations.  Patient describes recurrent episodes over the past 6 weeks marked by palpitations, general weakness, and mild nausea.  She reports that her blood pressure is increased to 170s over 100 during these episodes but is unsure of her heart rate.  Symptoms lasted approximately an hour before resolving and she is unable to identify any alleviating or provoking factors.  She has taken 1/2 tablet of Toprol during some of these episodes but it did not seem to speed resolution.  She denies any leg swelling, chest pain, diaphoresis, fever, or chills.  ED Course: Upon arrival to the ED, patient is found to be afebrile and saturating well on room air with stable blood pressure.  EKG features sinus rhythm and chest x-ray is negative for acute cardiopulmonary disease.  Chemistry panel notable for creatinine 1.77 and slight elevation in transaminases.  Troponin was 18 and then 21.  Patient was treated with Zofran in the ED.  Review of Systems:  All other systems reviewed and apart from HPI, are negative.  Past Medical History:  Diagnosis Date   Anemia    Anxiety    CKD (chronic kidney disease)    CKD III per PCP notes-pt denies   Congenital blindness    Diabetes mellitus    Diverticula of small intestine    Esophageal stricture    GERD (gastroesophageal reflux disease)    H/O hiatal hernia    Hyperlipidemia    Hypertension    Hypothyroidism    Iron deficiency anemia    Low magnesium level    Low sodium levels    Myocardial infarct (HCC)     Osteopenia    Osteoporosis    Postoperative nausea and vomiting    Renal disease    Retinitis    STEMI (ST elevation myocardial infarction) ([place])    SVT (supraventricular tachycardia) ([place])    Vitamin B 12 deficiency     Past Surgical History:  Procedure Laterality Date   BACK SURGERY     CARPAL TUNNEL RELEASE     Right    CESAREAN SECTION     CHOLECYSTECTOMY  1985   COLONOSCOPY     CORONARY BALLOON ANGIOPLASTY N/A 12/30/2019   Procedure: CORONARY BALLOON ANGIOPLASTY;  Surgeon: Troy Sine, MD;  Location: [place] [facility];  Service: Cardiovascular;  Laterality: N/A;   CORONARY/GRAFT ACUTE MI REVASCULARIZATION N/A 12/30/2019   Procedure: Coronary/Graft Acute MI Revascularization;  Surgeon: Troy Sine, MD;  Location: [place] [facility];  Service: Cardiovascular;  Laterality: N/A;   HERNIA REPAIR     LEFT HEART CATH AND CORONARY ANGIOGRAPHY N/A 12/30/2019   Procedure: LEFT HEART CATH AND CORONARY ANGIOGRAPHY;  Surgeon: Troy Sine, MD;  Location: [place] [facility];  Service: Cardiovascular;  Laterality: N/A;   Right knee arthroscopy  1993   Right thyroidectomy  2007   TOTAL ABDOMINAL HYSTERECTOMY W/ BILATERAL SALPINGOOPHORECTOMY  1993   UPPER GASTROINTESTINAL ENDOSCOPY      Social History:   reports that she has never smoked. She has never used smokeless tobacco.  She reports that she does not drink alcohol and does not use drugs.  Allergies  Allergen Reactions   Codeine Shortness Of Breath and Nausea And Vomiting   Erythromycin Nausea And Vomiting   Levaquin [Levofloxacin] Shortness Of Breath   Tramadol Nausea And Vomiting    Family History  Problem Relation Age of Onset   Coronary artery disease Father    COPD Father    CVA Mother    Diabetes Mellitus II Mother    Heart disease Mother    Diabetes Mellitus II Maternal Grandmother    Uterine cancer Sister    Hypertension Sister    Hypertension Sister        Brother also   Hyperlipidemia Sister         Brother also   Colon cancer Neg Hx    Rectal cancer Neg Hx    Stomach cancer Neg Hx    Esophageal cancer Neg Hx    Hypertension Mother    Hypertension Father      Prior to Admission medications   Medication Sig Start Date End Date Taking? Authorizing Provider  acetaminophen (TYLENOL) 325 MG tablet Take 2 tablets (650 mg total) by mouth every 4 (four) hours as needed for mild pain. 05/16/20   Saverio Danker, PA-C  aspirin EC 81 MG tablet Take 1 tablet (81 mg total) by mouth daily. Swallow whole. 06/27/20   Darreld Mclean, PA-C  Coenzyme Q10 (CO Q 10) 100 MG CAPS Take 100 mg by mouth daily.     [provider]  cyanocobalamin (,VITAMIN B-12,) 1000 MCG/ML injection Inject 1,000 mcg into the muscle every 30 (thirty) days. 11/02/19   [provider]  Dulaglutide (TRULICITY) 1.5 AC/1.6SA SOPN Inject into the skin once a week. 03/20/21   [provider]  empagliflozin (JARDIANCE) 10 MG TABS tablet Take 1 tablet (10 mg total) by mouth daily. 03/25/21   Kroeger, Lorelee Cover., PA-C  famotidine (PEPCID) 40 MG tablet Take 1 tablet (40 mg total) by mouth 2 (two) times daily. 05/26/21   Levin Erp, PA  fenofibrate (TRICOR) 145 MG tablet Take 1 tablet (145 mg total) by mouth daily. 03/25/21   Kroeger, Lorelee Cover., PA-C  levothyroxine (SYNTHROID) 50 MCG tablet Take 50 mcg by mouth daily before breakfast. 1 Tablet Daily Before Breakfast    [provider]  LORazepam (ATIVAN) 0.5 MG tablet Take 0.5 mg by mouth See admin instructions. Take one tablet (0.5 mg) by mouth daily at bedtime, may also take one tablet (0.5 mg) during the day as needed for anxiety.    [provider]  magnesium oxide (MAG-OX) 400 MG tablet Take 400 mg by mouth daily.    [provider]  metoprolol succinate (TOPROL-XL) 25 MG 24 hr tablet Take 2 tablets (50 mg total) by mouth daily. 03/25/21   Kroeger, Lorelee Cover., PA-C  nitroGLYCERIN (NITROSTAT) 0.4 MG SL tablet Place 1 tablet (0.4  mg total) under the tongue every 5 (five) minutes x 3 doses as needed for chest pain. 01/01/20   Bhagat, Crista Luria, PA  pantoprazole (PROTONIX) 40 MG tablet Take 40 mg by mouth daily. 03/22/20   [provider]  rosuvastatin (CRESTOR) 40 MG tablet TAKE 1 TABLET AT BEDTIME 07/21/21   Troy Sine, MD  sertraline (ZOLOFT) 50 MG tablet Take 50 mg by mouth at bedtime.  03/06/20   [provider]    Physical Exam: Vitals:   03/08/22 2030 03/08/22 2045 03/08/22 2100 03/08/22 2130  BP: Marland Kitchen)  151/85  (!) 157/81 (!) 148/79  Pulse: 71 75 70 71  Resp: '20 18 16 17  '$ Temp:    (!) 97.4 F (36.3 C)  TempSrc:    Oral  SpO2: 100% 100% 100% 100%  Weight:      Height:        Constitutional: NAD, calm  Eyes: PERTLA, lids and conjunctivae normal ENMT: Mucous membranes are moist. Posterior pharynx clear of any exudate or lesions.   Neck: supple, no masses  Respiratory: no wheezing, no crackles. No accessory muscle use.  Cardiovascular: S1 & S2 heard, regular rate and rhythm. No extremity edema.  Abdomen: No distension, no tenderness, soft. Bowel sounds active.  Musculoskeletal: no clubbing / cyanosis. No joint deformity upper and lower extremities.   Skin: no significant rashes, lesions, ulcers. Warm, dry, well-perfused. Neurologic: No gross facial asymmetry. Moving all extremities. Alert and oriented.  Psychiatric: Pleasant. Cooperative.    Labs and Imaging on Admission: I have personally reviewed following labs and imaging studies  CBC: Recent Labs  Lab 03/08/22 1845  WBC 9.9  NEUTROABS 7.1  HGB 13.4  HCT 38.6  MCV 92.8  PLT 448   Basic Metabolic Panel: Recent Labs  Lab 03/08/22 1845  NA 134*  K 5.1  CL 102  CO2 23  GLUCOSE 154*  BUN 15  CREATININE 1.77*  CALCIUM 9.3  MG 2.2   GFR: Estimated Creatinine Clearance: 20.6 mL/min (A) (by C-G formula based on SCr of 1.77 mg/dL (H)). Liver Function Tests: Recent Labs  Lab 03/08/22 1845  AST 43*  ALT 52*   ALKPHOS 43  BILITOT 0.5  PROT 7.3  ALBUMIN 4.1   Recent Labs  Lab 03/08/22 1845  LIPASE 54*   No results for input(s): "AMMONIA" in the last 168 hours. Coagulation Profile: No results for input(s): "INR", "PROTIME" in the last 168 hours. Cardiac Enzymes: No results for input(s): "CKTOTAL", "CKMB", "CKMBINDEX", "TROPONINI" in the last 168 hours. BNP (last 3 results) No results for input(s): "PROBNP" in the last 8760 hours. HbA1C: No results for input(s): "HGBA1C" in the last 72 hours. CBG: No results for input(s): "GLUCAP" in the last 168 hours. Lipid Profile: No results for input(s): "CHOL", "HDL", "LDLCALC", "TRIG", "CHOLHDL", "LDLDIRECT" in the last 72 hours. Thyroid Function Tests: Recent Labs    03/08/22 1732  TSH 4.248   Anemia Panel: No results for input(s): "VITAMINB12", "FOLATE", "FERRITIN", "TIBC", "IRON", "RETICCTPCT" in the last 72 hours. Urine analysis:    Component Value Date/Time   COLORURINE COLORLESS (A) 03/08/2022 2203   APPEARANCEUR CLEAR 03/08/2022 2203   LABSPEC 1.001 (L) 03/08/2022 2203   PHURINE 8.0 03/08/2022 2203   GLUCOSEU >=500 (A) 03/08/2022 2203   HGBUR NEGATIVE 03/08/2022 2203   BILIRUBINUR NEGATIVE 03/08/2022 2203   KETONESUR NEGATIVE 03/08/2022 2203   PROTEINUR NEGATIVE 03/08/2022 2203   UROBILINOGEN 0.2 12/29/2006 1526   NITRITE NEGATIVE 03/08/2022 2203   LEUKOCYTESUR NEGATIVE 03/08/2022 2203   Sepsis Labs: '@LABRCNTIP'$ (procalcitonin:4,lacticidven:4) )No results found for this or any previous visit (from the past 240 hour(s)).   Radiological Exams on Admission: DG Chest 2 View  Result Date: 03/08/2022 CLINICAL DATA:  Patient reports that she has a history of SVT and feels that she has been having palpitations intermittently over the last few weeks. Patient last felt this earlier this morning and is concerned she may have been in SVT. EXAM: CHEST - 2 VIEW COMPARISON:  03/01/2021 FINDINGS: Lungs are clear. Heart size and mediastinal  contours are within normal limits.  No effusion. Visualized bones unremarkable.  Cholecystectomy clips. IMPRESSION: No acute cardiopulmonary disease. Electronically Signed   By: Lucrezia Europe M.D.   On: 03/08/2022 18:09    EKG: Independently reviewed. Sinus rhythm, rate 83, QTc 446.   Assessment/Plan   1. Palpitations  - Presents with recurrent episodes of palpitations and general weakness that last ~1 hr  - She has remained in SR in ED, potassium and mag levels are normal, and TSH is normal  - Troponin was 18 then 21, taken <1 hr apart  - Continue cardiac monitoring, may need ambulatory monitor on d/c if etiology not identified overnight, check another troponin as 2nd was increased but drawn <1 hr after first though suspicion for ischemic etiology is low    2. CKD IV  - SCr is 1.77 in ED, this appears to be her baseline  - Renally-dose medications, monitor    3. CAD  - Hx of balloon angioplasty to RPDA in June 2021, also had 30% stenosis of proximal LAD at that time  - No anginal complaints  - Continue ASA, Crestor, and Toprol    4. Hypertension - Continue metoprolol    5. Type II DM  - A1c was 7.4 in October 2021  - Check CBGs and use low-intensity SSI for now    6. Anxiety  - Continue Zoloft and Ativan   7. GERD  - Continue H2-blocker and PPI    DVT prophylaxis: sq heparin  Code Status: Full  Level of Care: Level of care: Telemetry Cardiac Family Communication: none present  Disposition Plan:  Patient is from: home  Anticipated d/c is to: Home  Anticipated d/c date is: 03/09/22  Patient currently: Pending cardiac monitoring, repeat troponin  Consults called: none  Admission status: Observation     Vianne Bulls, MD [facility]  03/08/2022, 11:21 PM

## 2022-03-09 DIAGNOSIS — E118 Type 2 diabetes mellitus with unspecified complications: Secondary | ICD-10-CM | POA: Diagnosis not present

## 2022-03-09 DIAGNOSIS — R002 Palpitations: Secondary | ICD-10-CM | POA: Diagnosis not present

## 2022-03-09 DIAGNOSIS — R778 Other specified abnormalities of plasma proteins: Secondary | ICD-10-CM | POA: Diagnosis not present

## 2022-03-09 DIAGNOSIS — N184 Chronic kidney disease, stage 4 (severe): Secondary | ICD-10-CM | POA: Diagnosis not present

## 2022-03-09 DIAGNOSIS — I10 Essential (primary) hypertension: Secondary | ICD-10-CM | POA: Diagnosis not present

## 2022-03-09 LAB — CBC
HCT: 38.9 % (ref 36.0–46.0)
Hemoglobin: 12.7 g/dL (ref 12.0–15.0)
MCH: 31.1 pg (ref 26.0–34.0)
MCHC: 32.6 g/dL (ref 30.0–36.0)
MCV: 95.1 fL (ref 80.0–100.0)
Platelets: 268 10*3/uL (ref 150–400)
RBC: 4.09 MIL/uL (ref 3.87–5.11)
RDW: 12.6 % (ref 11.5–15.5)
WBC: 7.9 10*3/uL (ref 4.0–10.5)
nRBC: 0 % (ref 0.0–0.2)

## 2022-03-09 LAB — COMPREHENSIVE METABOLIC PANEL
ALT: 45 U/L — ABNORMAL HIGH (ref 0–44)
AST: 39 U/L (ref 15–41)
Albumin: 3.7 g/dL (ref 3.5–5.0)
Alkaline Phosphatase: 39 U/L (ref 38–126)
Anion gap: 11 (ref 5–15)
BUN: 15 mg/dL (ref 8–23)
CO2: 19 mmol/L — ABNORMAL LOW (ref 22–32)
Calcium: 8.8 mg/dL — ABNORMAL LOW (ref 8.9–10.3)
Chloride: 106 mmol/L (ref 98–111)
Creatinine, Ser: 1.69 mg/dL — ABNORMAL HIGH (ref 0.44–1.00)
GFR, Estimated: 32 mL/min — ABNORMAL LOW (ref >60)
Glucose, Bld: 114 mg/dL — ABNORMAL HIGH (ref 70–99)
Potassium: 5 mmol/L (ref 3.5–5.1)
Sodium: 136 mmol/L (ref 135–145)
Total Bilirubin: 0.4 mg/dL (ref 0.3–1.2)
Total Protein: 6.6 g/dL (ref 6.5–8.1)

## 2022-03-09 LAB — CBG MONITORING, ED
Glucose-Capillary: 138 mg/dL — ABNORMAL HIGH (ref 70–99)
Glucose-Capillary: 140 mg/dL — ABNORMAL HIGH (ref 70–99)

## 2022-03-09 LAB — GLUCOSE, CAPILLARY
Glucose-Capillary: 136 mg/dL — ABNORMAL HIGH (ref 70–99)
Glucose-Capillary: 144 mg/dL — ABNORMAL HIGH (ref 70–99)

## 2022-03-09 LAB — TROPONIN I (HIGH SENSITIVITY): Troponin I (High Sensitivity): 25 ng/L — ABNORMAL HIGH (ref <18)

## 2022-03-09 LAB — HEMOGLOBIN A1C
Hgb A1c MFr Bld: 7.1 % — ABNORMAL HIGH (ref 4.8–5.6)
Mean Plasma Glucose: 157.07 mg/dL

## 2022-03-09 NOTE — ED Notes (Signed)
ED TO INPATIENT HANDOFF REPORT  ED Nurse Name and Phone #:   S Name/Age/Gender Jessica [name] 71 y.o. female Room/Bed: H021C/H021C  Code Status   Code Status: Full Code  Home/SNF/Other Home Patient oriented to: self, place, time, and situation Is this baseline? Yes   Triage Complete: Triage complete  Chief Complaint Palpitations [R00.2]  Triage Note BIB EMS from home.  States she has had "palpitations" for several weeks however today feels "weak"  Has been here in the past reportedly but did not wait to be seen.  Pt is blind.     Allergies Allergies  Allergen Reactions   Codeine Shortness Of Breath and Nausea And Vomiting   Erythromycin Nausea And Vomiting   Levaquin [Levofloxacin] Shortness Of Breath   Tramadol Nausea And Vomiting    Level of Care/Admitting Diagnosis ED Disposition     ED Disposition  Admit   Condition  --   Comment  Hospital Area: [facility] [[id number]]  Level of Care: Telemetry Cardiac [[id number]]  May place patient in observation at [facility] or [facility] if equivalent level of care is available:: Yes  Covid Evaluation: Asymptomatic - no recent exposure (last 10 days) testing not required  Diagnosis: Palpitations [785.1.ICD-9-CM]  Admitting Physician: Vianne Bulls [[id number]]  Attending Physician: Vianne Bulls [[id number]]          B Medical/Surgery History Past Medical History:  Diagnosis Date   Anemia    Anxiety    CKD (chronic kidney disease)    CKD III per PCP notes-pt denies   Congenital blindness    Diabetes mellitus    Diverticula of small intestine    Esophageal stricture    GERD (gastroesophageal reflux disease)    H/O hiatal hernia    Hyperlipidemia    Hypertension    Hypothyroidism    Iron deficiency anemia    Low magnesium level    Low sodium levels    Myocardial infarct (HCC)    Osteopenia    Osteoporosis    Postoperative nausea and vomiting    Renal disease    Retinitis    STEMI (ST  elevation myocardial infarction) ([facility])    SVT (supraventricular tachycardia) (HCC)    Vitamin B 12 deficiency    Past Surgical History:  Procedure Laterality Date   BACK SURGERY     CARPAL TUNNEL RELEASE     Right    CESAREAN SECTION     CHOLECYSTECTOMY  1985   COLONOSCOPY     CORONARY BALLOON ANGIOPLASTY N/A 12/30/2019   Procedure: CORONARY BALLOON ANGIOPLASTY;  Surgeon: Troy Sine, MD;  Location: [facility];  Service: Cardiovascular;  Laterality: N/A;   CORONARY/GRAFT ACUTE MI REVASCULARIZATION N/A 12/30/2019   Procedure: Coronary/Graft Acute MI Revascularization;  Surgeon: Troy Sine, MD;  Location: [facility];  Service: Cardiovascular;  Laterality: N/A;   HERNIA REPAIR     LEFT HEART CATH AND CORONARY ANGIOGRAPHY N/A 12/30/2019   Procedure: LEFT HEART CATH AND CORONARY ANGIOGRAPHY;  Surgeon: Troy Sine, MD;  Location: [facility];  Service: Cardiovascular;  Laterality: N/A;   Right knee arthroscopy  1993   Right thyroidectomy  2007   TOTAL ABDOMINAL HYSTERECTOMY W/ BILATERAL SALPINGOOPHORECTOMY  1993   UPPER GASTROINTESTINAL ENDOSCOPY       A IV Location/Drains/Wounds Patient Lines/Drains/Airways Status     Active Line/Drains/Airways     Name Placement date Placement time Site Days   Peripheral IV 03/08/22 20  G Right Antecubital 03/08/22  1849  Antecubital  1            Intake/Output Last 24 hours  Intake/Output Summary (Last 24 hours) at 03/09/2022 1512 Last data filed at 03/09/2022 0847 Gross per 24 hour  Intake 120 ml  Output --  Net 120 ml    Labs/Imaging Results for orders placed or performed during the hospital encounter of 03/08/22 (from the past 48 hour(s))  TSH     Status: None   Collection Time: 03/08/22  5:32 PM  Result Value Ref Range   TSH 4.248 0.350 - 4.500 uIU/mL    Comment: Performed by a 3rd Generation assay with a functional sensitivity of <=0.01 uIU/mL. Performed at [facility], [place] [address]  [place], [place] [address]   Comprehensive metabolic panel     Status: Abnormal   Collection Time: 03/08/22  6:45 PM  Result Value Ref Range   Sodium 134 (L) 135 - 145 mmol/L   Potassium 5.1 3.5 - 5.1 mmol/L   Chloride 102 98 - 111 mmol/L   CO2 23 22 - 32 mmol/L   Glucose, Bld 154 (H) 70 - 99 mg/dL    Comment: Glucose reference range applies only to samples taken after fasting for at least 8 hours.   BUN 15 8 - 23 mg/dL   Creatinine, Ser 1.77 (H) 0.44 - 1.00 mg/dL   Calcium 9.3 8.9 - 10.3 mg/dL   Total Protein 7.3 6.5 - 8.1 g/dL   Albumin 4.1 3.5 - 5.0 g/dL   AST 43 (H) 15 - 41 U/L   ALT 52 (H) 0 - 44 U/L   Alkaline Phosphatase 43 38 - 126 U/L   Total Bilirubin 0.5 0.3 - 1.2 mg/dL   GFR, Estimated 30 (L) >60 mL/min    Comment: (NOTE) Calculated using the CKD-EPI Creatinine Equation (2021)    Anion gap 9 5 - 15    Comment: Performed at [facility] [address] [place], [place] [address]  Lipase, blood     Status: Abnormal   Collection Time: 03/08/22  6:45 PM  Result Value Ref Range   Lipase 54 (H) 11 - 51 U/L    Comment: Performed at [facility] [address], [place], [place] [address]  CBC with Differential     Status: None   Collection Time: 03/08/22  6:45 PM  Result Value Ref Range   WBC 9.9 4.0 - 10.5 K/uL   RBC 4.16 3.87 - 5.11 MIL/uL   Hemoglobin 13.4 12.0 - 15.0 g/dL   HCT 38.6 36.0 - 46.0 %   MCV 92.8 80.0 - 100.0 fL   MCH 32.2 26.0 - 34.0 pg   MCHC 34.7 30.0 - 36.0 g/dL   RDW 12.4 11.5 - 15.5 %   Platelets 288 150 - 400 K/uL   nRBC 0.0 0.0 - 0.2 %   Neutrophils Relative % 73 %   Neutro Abs 7.1 1.7 - 7.7 K/uL   Lymphocytes Relative 16 %   Lymphs Abs 1.6 0.7 - 4.0 K/uL   Monocytes Relative 9 %   Monocytes Absolute 0.9 0.1 - 1.0 K/uL   Eosinophils Relative 1 %   Eosinophils Absolute 0.1 0.0 - 0.5 K/uL   Basophils Relative 1 %   Basophils Absolute 0.1 0.0 - 0.1 K/uL   Immature Granulocytes 0 %   Abs Immature Granulocytes 0.04 0.00 - 0.07 K/uL     Comment: Performed at [facility], [address], [place], [place] [address]  Magnesium     Status: None   Collection Time: 03/08/22  6:45 PM  Result Value Ref Range   Magnesium 2.2 1.7 - 2.4 mg/dL    Comment: Performed at [facility] [address] [place], [place] [address]  Troponin I (High Sensitivity)     Status: Abnormal   Collection Time: 03/08/22  6:45 PM  Result Value Ref Range   Troponin I (High Sensitivity) 18 (H) <18 ng/L    Comment: (NOTE) Elevated high sensitivity troponin I (hsTnI) values and significant  changes across serial measurements may suggest ACS but many other  chronic and acute conditions are known to elevate hsTnI results.  Refer to the "Links" section for chest pain algorithms and additional  guidance. Performed at [facility], [place] [address], [place], [place] [address]   Troponin I (High Sensitivity)     Status: Abnormal   Collection Time: 03/08/22  7:33 PM  Result Value Ref Range   Troponin I (High Sensitivity) 21 (H) <18 ng/L    Comment: (NOTE) Elevated high sensitivity troponin I (hsTnI) values and significant  changes across serial measurements may suggest ACS but many other  chronic and acute conditions are known to elevate hsTnI results.  Refer to the "Links" section for chest pain algorithms and additional  guidance. Performed at [facility], [place] [address] [place], [place] [address]   Urinalysis, Routine w reflex microscopic     Status: Abnormal   Collection Time: 03/08/22 10:03 PM  Result Value Ref Range   Color, Urine COLORLESS (A) YELLOW   APPearance CLEAR CLEAR   Specific Gravity, Urine 1.001 (L) 1.005 - 1.030   pH 8.0 5.0 - 8.0   Glucose, UA >=500 (A) NEGATIVE mg/dL   Hgb urine dipstick NEGATIVE NEGATIVE   Bilirubin Urine NEGATIVE NEGATIVE   Ketones, ur NEGATIVE NEGATIVE mg/dL   Protein, ur NEGATIVE NEGATIVE mg/dL   Nitrite NEGATIVE NEGATIVE   Leukocytes,Ua NEGATIVE NEGATIVE   RBC / HPF 0-5 0 -  5 RBC/hpf   WBC, UA 0-5 0 - 5 WBC/hpf   Bacteria, UA NONE SEEN NONE SEEN   Squamous Epithelial / LPF 0-5 0 - 5    Comment: Performed at [facility] [address] [place], [place] [address]  Troponin I (High Sensitivity)     Status: Abnormal   Collection Time: 03/08/22 11:26 PM  Result Value Ref Range   Troponin I (High Sensitivity) 25 (H) <18 ng/L    Comment: (NOTE) Elevated high sensitivity troponin I (hsTnI) values and significant  changes across serial measurements may suggest ACS but many other  chronic and acute conditions are known to elevate hsTnI results.  Refer to the "Links" section for chest pain algorithms and additional  guidance. Performed at [facility], [place] [address] [place] [address]   CBC     Status: None   Collection Time: 03/09/22  2:30 AM  Result Value Ref Range   WBC 7.9 4.0 - 10.5 K/uL   RBC 4.09 3.87 - 5.11 MIL/uL   Hemoglobin 12.7 12.0 - 15.0 g/dL   HCT 38.9 36.0 - 46.0 %   MCV 95.1 80.0 - 100.0 fL   MCH 31.1 26.0 - 34.0 pg   MCHC 32.6 30.0 - 36.0 g/dL   RDW 12.6 11.5 - 15.5 %   Platelets 268 150 - 400 K/uL   nRBC 0.0 0.0 - 0.2 %  Comment: Performed at [facility], [place] [address] [place], [place] [address]  Comprehensive metabolic panel     Status: Abnormal   Collection Time: 03/09/22  6:35 AM  Result Value Ref Range   Sodium 136 135 - 145 mmol/L   Potassium 5.0 3.5 - 5.1 mmol/L   Chloride 106 98 - 111 mmol/L   CO2 19 (L) 22 - 32 mmol/L   Glucose, Bld 114 (H) 70 - 99 mg/dL    Comment: Glucose reference range applies only to samples taken after fasting for at least 8 hours.   BUN 15 8 - 23 mg/dL   Creatinine, Ser 1.69 (H) 0.44 - 1.00 mg/dL   Calcium 8.8 (L) 8.9 - 10.3 mg/dL   Total Protein 6.6 6.5 - 8.1 g/dL   Albumin 3.7 3.5 - 5.0 g/dL   AST 39 15 - 41 U/L   ALT 45 (H) 0 - 44 U/L   Alkaline Phosphatase 39 38 - 126 U/L   Total Bilirubin 0.4 0.3 - 1.2 mg/dL   GFR, Estimated 32 (L) >60 mL/min    Comment:  (NOTE) Calculated using the CKD-EPI Creatinine Equation (2021)    Anion gap 11 5 - 15    Comment: Performed at [facility] [address] [place], [place] [address]  Hemoglobin A1c     Status: Abnormal   Collection Time: 03/09/22  6:35 AM  Result Value Ref Range   Hgb A1c MFr Bld 7.1 (H) 4.8 - 5.6 %    Comment: (NOTE) Pre diabetes:          5.7%-6.4%  Diabetes:              >6.4%  Glycemic control for   <7.0% adults with diabetes    Mean Plasma Glucose 157.07 mg/dL    Comment: Performed at [facility] [address] [place], [place] [address]  CBG monitoring, ED     Status: Abnormal   Collection Time: 03/09/22  8:02 AM  Result Value Ref Range   Glucose-Capillary 138 (H) 70 - 99 mg/dL    Comment: Glucose reference range applies only to samples taken after fasting for at least 8 hours.  CBG monitoring, ED     Status: Abnormal   Collection Time: 03/09/22 11:32 AM  Result Value Ref Range   Glucose-Capillary 140 (H) 70 - 99 mg/dL    Comment: Glucose reference range applies only to samples taken after fasting for at least 8 hours.   Comment 1 Notify RN    Comment 2 Document in Chart    DG Chest 2 View  Result Date: 03/08/2022 CLINICAL DATA:  Patient reports that she has a history of SVT and feels that she has been having palpitations intermittently over the last few weeks. Patient last felt this earlier this morning and is concerned she may have been in SVT. EXAM: CHEST - 2 VIEW COMPARISON:  03/01/2021 FINDINGS: Lungs are clear. Heart size and mediastinal contours are within normal limits. No effusion. Visualized bones unremarkable.  Cholecystectomy clips. IMPRESSION: No acute cardiopulmonary disease. Electronically Signed   By: Lucrezia Europe M.D.   On: 03/08/2022 18:09    Pending Labs Unresulted Labs (From admission, onward)     Start     Ordered   03/08/22 1735  Urine Culture  Once,   URGENT       Question:  Indication  Answer:  Dysuria   03/08/22 1734  Vitals/Pain Today's Vitals   03/09/22 0236 03/09/22 0512 03/09/22 0845 03/09/22 1334  BP: 128/74 102/66 (!) 127/90 135/74  Pulse: 72 72 70 69  Resp: '19 13 20 18  '$ Temp:   98.5 F (36.9 C) 98.4 F (36.9 C)  TempSrc:   Oral Oral  SpO2: 98% 99% 100% 98%  Weight:      Height:      PainSc:   4      Isolation Precautions No active isolations  Medications Medications  aspirin EC tablet 81 mg (81 mg Oral Given 03/09/22 0918)  metoprolol succinate (TOPROL-XL) 24 hr tablet 50 mg (50 mg Oral Given 03/09/22 0917)  rosuvastatin (CRESTOR) tablet 40 mg (40 mg Oral Given 03/09/22 0004)  LORazepam (ATIVAN) tablet 0.5 mg (0.5 mg Oral Given 03/09/22 0003)  sertraline (ZOLOFT) tablet 50 mg (50 mg Oral Given 03/09/22 0002)  levothyroxine (SYNTHROID) tablet 50 mcg (50 mcg Oral Given 03/09/22 0533)  famotidine (PEPCID) tablet 20 mg (20 mg Oral Given 03/09/22 0918)  pantoprazole (PROTONIX) EC tablet 40 mg (40 mg Oral Given 03/09/22 0917)  heparin injection 5,000 Units (5,000 Units Subcutaneous Given 03/09/22 1416)  sodium chloride flush (NS) 0.9 % injection 3 mL (3 mLs Intravenous Given 03/09/22 0918)  acetaminophen (TYLENOL) tablet 650 mg (has no administration in time range)    Or  acetaminophen (TYLENOL) suppository 650 mg (has no administration in time range)  senna-docusate (Senokot-S) tablet 1 tablet (has no administration in time range)  ondansetron (ZOFRAN) tablet 4 mg (has no administration in time range)    Or  ondansetron (ZOFRAN) injection 4 mg (has no administration in time range)  insulin aspart (novoLOG) injection 0-6 Units ( Subcutaneous Not Given 03/09/22 1134)  metoprolol tartrate (LOPRESSOR) tablet 25 mg (has no administration in time range)  ondansetron (ZOFRAN-ODT) disintegrating tablet 4 mg (4 mg Oral Given 03/08/22 1817)    Mobility walks with person assist Low fall risk   Focused Assessments Cardiac Assessment Handoff:  Cardiac Rhythm: Normal sinus rhythm Lab Results   Component Value Date   CKTOTAL 68 09/22/2017   CKMB 0.8 12/30/2006   TROPONINI 0.01        NO INDICATION OF MYOCARDIAL INJURY. 12/30/2006   No results found for: "DDIMER" Does the Patient currently have chest pain? No   , Neuro Assessment Handoff:  Swallow screen pass? Yes  Cardiac Rhythm: Normal sinus rhythm       Neuro Assessment: Within Defined Limits Neuro Checks:      Last Documented NIHSS Modified Score:   Has TPA been given? No If patient is a Neuro Trauma and patient is going to OR before floor call report to [place] nurse: [phone] or [phone]   R Recommendations: See Admitting Provider Note  Report given to:   Additional Notes: pt is blind, walks to bathroom with one assist

## 2022-03-09 NOTE — ED Notes (Signed)
Pt husband updated

## 2022-03-09 NOTE — ED Notes (Signed)
Pt attached to bedside cardiac monitor in hall.

## 2022-03-09 NOTE — ED Notes (Signed)
Breakfast tray is at bedside.

## 2022-03-09 NOTE — Progress Notes (Signed)
[facility] PROGRESS NOTE    Progress Note  Jessica [name]  KPV:[id number] DOB: 1951/01/05 DOA: 03/08/2022 PCP: Ginger Organ., MD     Brief Narrative:   Jessica [name] is an 71 y.o. female past medical history significant for hypertension, hyperlipidemia, chronic kidney stage IV, CAD status post balloon angioplasty in June 2021 comes into the emergency room for palpitation over the last 6 weeks and generalized weakness.  She relate she has taken Toprol during this episodes which does not help twelve-lead EKG shows sinus rhythm chest x-ray negative for cardiopulmonary disease creatinine 1.7 tropes flat    Assessment/Plan:   Palpitations: Sinus rhythm in the ED, TSH is normal. Troponins are flat. Potassium is 5.1 magnesium 2.2. Twelve-lead EKG showed sinus rhythm normal axis nonspecific T wave changes. She is on telemetry but I cannot go back through telemetry history or strip. She will need to be here an additional 24 hours to monitor on a recorded telemetry  Chronic kidney disease stage IV: Creatinine appears to be at baseline.  Coronary artery disease/elevated troponins: History of balloon angioplasty in 2021 with 30% stenosis to the LAD. She denies any anginal symptoms or shortness of breath. Continue aspirin Crestor and Toprol. Cardiac biomarkers have basically remained flat. Elevation in cardiac biomarkers likely due to renal disease.  Essential hypertension: Continue metoprolol.  Diabetes mellitus type 2: With an A1c of 7.4 back in October 2020 1 repeat A1c Check an A1c.  Anxiety: Continue Zoloft and Ativan.  GERD: Continue H2 blockers.  DVT prophylaxis: lovenox Family Communication:none Status is: Observation The patient remains OBS appropriate and will d/c before 2 midnights.    Code Status:     Code Status Orders  (From admission, onward)           Start     Ordered   03/08/22 2259  Full code  Continuous        03/08/22 2259            Code Status History     Date Active Date Inactive Code Status Order ID Comments User Context   05/14/2020 0044 05/16/2020 1803 Full Code [id number]  Georganna Skeans, MD ED   01/13/2020 1542 01/14/2020 1757 Full Code [id number]  Tommie Raymond, NP ED   12/30/2019 1438 01/01/2020 1922 Full Code [id number]  Troy Sine, MD Inpatient   12/30/2019 1438 12/30/2019 1438 Full Code [id number]  Charlie Pitter, PA-C Inpatient         IV Access:   Peripheral IV   Procedures and diagnostic studies:   DG Chest 2 View  Result Date: 03/08/2022 CLINICAL DATA:  Patient reports that she has a history of SVT and feels that she has been having palpitations intermittently over the last few weeks. Patient last felt this earlier this morning and is concerned she may have been in SVT. EXAM: CHEST - 2 VIEW COMPARISON:  03/01/2021 FINDINGS: Lungs are clear. Heart size and mediastinal contours are within normal limits. No effusion. Visualized bones unremarkable.  Cholecystectomy clips. IMPRESSION: No acute cardiopulmonary disease. Electronically Signed   By: Lucrezia Europe M.D.   On: 03/08/2022 18:09     Medical Consultants:   None.   Subjective:    Jessica [name] denies any chest pain or shortness of breath  Objective:    Vitals:   03/09/22 0220 03/09/22 0222 03/09/22 0236 03/09/22 0512  BP:   128/74 102/66  Pulse: 69 70 72 72  Resp: '13 18 19 '$ 13  Temp:      TempSrc:      SpO2: 97% 96% 98% 99%  Weight:      Height:       SpO2: 99 %  No intake or output data in the 24 hours ending 03/09/22 0628 Filed Weights   03/08/22 1720  Weight: 50.8 kg    Exam: General exam: In no acute distress. Respiratory system: Good air movement and clear to auscultation. Cardiovascular system: S1 & S2 heard, RRR. No JVD. Gastrointestinal system: Abdomen is nondistended, soft and nontender.  Extremities: No pedal edema. Skin: No rashes, lesions or ulcers Psychiatry: Judgement and insight appear normal. Mood  & affect appropriate.    Data Reviewed:    Labs: Basic Metabolic Panel: Recent Labs  Lab 03/08/22 1845  NA 134*  K 5.1  CL 102  CO2 23  GLUCOSE 154*  BUN 15  CREATININE 1.77*  CALCIUM 9.3  MG 2.2   GFR Estimated Creatinine Clearance: 20.6 mL/min (A) (by C-G formula based on SCr of 1.77 mg/dL (H)). Liver Function Tests: Recent Labs  Lab 03/08/22 1845  AST 43*  ALT 52*  ALKPHOS 43  BILITOT 0.5  PROT 7.3  ALBUMIN 4.1   Recent Labs  Lab 03/08/22 1845  LIPASE 54*   No results for input(s): "AMMONIA" in the last 168 hours. Coagulation profile No results for input(s): "INR", "PROTIME" in the last 168 hours. COVID-19 Labs  No results for input(s): "DDIMER", "FERRITIN", "LDH", "CRP" in the last 72 hours.  Lab Results  Component Value Date   SARSCOV2NAA NEGATIVE 03/02/2021   [place] NEGATIVE 01/13/2020   [place] NEGATIVE 12/30/2019    CBC: Recent Labs  Lab 03/08/22 1845 03/09/22 0230  WBC 9.9 7.9  NEUTROABS 7.1  --   HGB 13.4 12.7  HCT 38.6 38.9  MCV 92.8 95.1  PLT 288 268   Cardiac Enzymes: No results for input(s): "CKTOTAL", "CKMB", "CKMBINDEX", "TROPONINI" in the last 168 hours. BNP (last 3 results) No results for input(s): "PROBNP" in the last 8760 hours. CBG: No results for input(s): "GLUCAP" in the last 168 hours. D-Dimer: No results for input(s): "DDIMER" in the last 72 hours. Hgb A1c: No results for input(s): "HGBA1C" in the last 72 hours. Lipid Profile: No results for input(s): "CHOL", "HDL", "LDLCALC", "TRIG", "CHOLHDL", "LDLDIRECT" in the last 72 hours. Thyroid function studies: Recent Labs    03/08/22 1732  TSH 4.248   Anemia work up: No results for input(s): "VITAMINB12", "FOLATE", "FERRITIN", "TIBC", "IRON", "RETICCTPCT" in the last 72 hours. Sepsis Labs: Recent Labs  Lab 03/08/22 1845 03/09/22 0230  WBC 9.9 7.9   Microbiology No results found for this or any previous visit (from the past 240  hour(s)).   Medications:    aspirin EC  81 mg Oral Daily   famotidine  20 mg Oral Daily   heparin  5,000 Units Subcutaneous Q8H   insulin aspart  0-6 Units Subcutaneous TID WC   levothyroxine  50 mcg Oral QAC breakfast   LORazepam  0.5 mg Oral QHS   metoprolol succinate  50 mg Oral Daily   pantoprazole  40 mg Oral Daily   rosuvastatin  40 mg Oral QHS   sertraline  50 mg Oral QHS   sodium chloride flush  3 mL Intravenous Q12H   Continuous Infusions:    LOS: 0 days   Charlynne Cousins  [facility]  03/09/2022, 6:28 AM

## 2022-03-09 NOTE — ED Notes (Signed)
Ambulated pt to bathroom. Pt did well.

## 2022-03-10 ENCOUNTER — Encounter (HOSPITAL_COMMUNITY): Payer: Self-pay | Admitting: Family Medicine

## 2022-03-10 ENCOUNTER — Other Ambulatory Visit: Payer: Self-pay | Admitting: Physician Assistant

## 2022-03-10 DIAGNOSIS — R002 Palpitations: Secondary | ICD-10-CM

## 2022-03-10 LAB — URINE CULTURE: Culture: 20000 — AB

## 2022-03-10 LAB — GLUCOSE, CAPILLARY
Glucose-Capillary: 172 mg/dL — ABNORMAL HIGH (ref 70–99)
Glucose-Capillary: 89 mg/dL (ref 70–99)

## 2022-03-10 MED ORDER — HYDRALAZINE HCL 10 MG PO TABS: 10.0000 mg | ORAL_TABLET | Freq: Three times a day (TID) | ORAL | 0 refills | Status: DC | PRN

## 2022-03-10 NOTE — Consult Note (Addendum)
Cardiology Consultation:   Jessica [name] ID: Jessica Jessica [name] MRN: [id number]; DOB: 10-20-1950  Admit date: 03/08/2022 Date of Consult: 03/10/2022  PCP:  Ginger Organ., MD   [facility] Providers Cardiologist:  Shelva Majestic, MD   {    Jessica [name] Profile:   Jessica Jessica [name] is a 71 y.o. female with a hx of CAD s/p STEMI 12/2019 s/p balloon angioplasty to dPDA, HTN, HLD, DM type 2, hypothyroidism, CKD stage3 and blindness since birth who is being seen 03/10/2022 for Jessica evaluation of palpitations/Elevated troponin at Jessica request of Dr. Erlinda Hong.  She presented to Jessica hospital 12/2019 with chest pain and was found to have a STEMI. She was taken emergently to Jessica cath lab where she was found to have 100% RPDA stenosis managed with balloon angioplasty, otherwise 30% pLAD stenosis which was medically managed. She had an echocardiogram 12/2019 which showed EF 50-55%, mild LVH, moderate hypokinesis of inferiore wall, apical, and inferoseptal wall, G1DD, and no significant valvular abnormalities. She was started on aspirin and brilinta.  She was taken off brilinta following blunt trauma to her abdomen resulting in significant abdominal wall hematoma requiring 2 uPRBC.   Last seen by Dr. Claiborne Billings 10/05/2021.  History of Present Illness:   Jessica Jessica [name] presented for evaluation of "episode".  Jessica [name] reported 6 weeks history of intermittent feeling of flushed with palpitations/fluttering sensation, generalized weakness, pain in both arms but no chest pain or shortness of breath.  This occurs with or without exertion.  Sometimes upon standing.  Husband noted blood pressure of 190s/100s multiple times during this episode.  Heart rate was in 80s.  Never above 100.  Episode lasting for 30 minutes to hours.  Self resolved.  This is different than her prior MI.  At that time, she had right back pain.  No slurred speech or weakness of one side.  Due to ongoing symptoms he presented to ER for further evaluation.  High-sensitivity  troponin 18>>21>>25 Scr 1.77>>1.69 TSH normal Chest x-ray without acute cardiopulmonary disease  She walks on driveway which has incline.  No exertional chest pain, shortness of breath or fatigue. She denies orthopnea, PND, syncope or melena.   Past Medical History:  Diagnosis Date   Anemia    Anxiety    CKD (chronic kidney disease)    CKD III per PCP notes-pt denies   Congenital blindness    Diabetes mellitus    Diverticula of small intestine    Esophageal stricture    GERD (gastroesophageal reflux disease)    H/O hiatal hernia    Hyperlipidemia    Hypertension    Hypothyroidism    Iron deficiency anemia    Low magnesium level    Low sodium levels    Myocardial infarct (HCC)    Osteopenia    Osteoporosis    Postoperative nausea and vomiting    Renal disease    Retinitis    STEMI (ST elevation myocardial infarction) ([place])    SVT (supraventricular tachycardia) ([place])    Vitamin B 12 deficiency     Past Surgical History:  Procedure Laterality Date   BACK SURGERY     CARPAL TUNNEL RELEASE     Right    CESAREAN SECTION     CHOLECYSTECTOMY  1985   COLONOSCOPY     CORONARY BALLOON ANGIOPLASTY N/A 12/30/2019   Procedure: CORONARY BALLOON ANGIOPLASTY;  Surgeon: Troy Sine, MD;  Location: [place] [facility];  Service: Cardiovascular;  Laterality: N/A;   CORONARY/GRAFT ACUTE MI REVASCULARIZATION  N/A 12/30/2019   Procedure: Coronary/Graft Acute MI Revascularization;  Surgeon: Troy Sine, MD;  Location: [place] [facility];  Service: Cardiovascular;  Laterality: N/A;   HERNIA REPAIR     LEFT HEART CATH AND CORONARY ANGIOGRAPHY N/A 12/30/2019   Procedure: LEFT HEART CATH AND CORONARY ANGIOGRAPHY;  Surgeon: Troy Sine, MD;  Location: [place] [facility];  Service: Cardiovascular;  Laterality: N/A;   Right knee arthroscopy  1993   Right thyroidectomy  2007   TOTAL ABDOMINAL HYSTERECTOMY W/ BILATERAL SALPINGOOPHORECTOMY  1993   UPPER GASTROINTESTINAL ENDOSCOPY        Inpatient Medications: Scheduled Meds:  aspirin EC  81 mg Oral Daily   famotidine  20 mg Oral Daily   heparin  5,000 Units Subcutaneous Q8H   insulin aspart  0-6 Units Subcutaneous TID WC   levothyroxine  50 mcg Oral QAC breakfast   LORazepam  0.5 mg Oral QHS   metoprolol succinate  50 mg Oral Daily   pantoprazole  40 mg Oral Daily   rosuvastatin  40 mg Oral QHS   sertraline  50 mg Oral QHS   sodium chloride flush  3 mL Intravenous Q12H   Continuous Infusions:  PRN Meds: acetaminophen **OR** acetaminophen, metoprolol tartrate, ondansetron **OR** ondansetron (ZOFRAN) IV, senna-docusate  Allergies:    Allergies  Allergen Reactions   Codeine Shortness Of Breath and Nausea And Vomiting   Erythromycin Nausea And Vomiting   Levaquin [Levofloxacin] Shortness Of Breath   Tramadol Nausea And Vomiting    Social History:   Social History   Socioeconomic History   Marital status: Married    Spouse name: Not on file   Number of children: 4   Years of education: Not on file   Highest education level: Not on file  Occupational History   Occupation: house wife    Occupation: retired    Fish farm manager: [employer]  Tobacco Use   Smoking status: Never   Smokeless tobacco: Never  Vaping Use   Vaping Use: Never used  Substance and Sexual Activity   Alcohol use: No   Drug use: No   Sexual activity: Not Currently  Other Topics Concern   Not on file  Social History Narrative   ** Merged History Encounter **       Social Determinants of Health   Financial Resource Strain: Not on file  Food Insecurity: Not on file  Transportation Needs: Not on file  Physical Activity: Not on file  Stress: Not on file  Social Connections: Not on file  Intimate Partner Violence: Not on file    Family History:    Family History  Problem Relation Age of Onset   Coronary artery disease Father    COPD Father    CVA Mother    Diabetes Mellitus II Mother    Heart disease Mother     Diabetes Mellitus II Maternal Grandmother    Uterine cancer Sister    Hypertension Sister    Hypertension Sister        Brother also   Hyperlipidemia Sister        Brother also   Colon cancer Neg Hx    Rectal cancer Neg Hx    Stomach cancer Neg Hx    Esophageal cancer Neg Hx    Hypertension Mother    Hypertension Father      ROS:  Please see Jessica history of present illness.   All other ROS reviewed and negative.  Physical Exam/Data:   Vitals:   03/09/22 1334 03/09/22 1945 03/10/22 0011 03/10/22 0431  BP: 135/74 (!) 145/78 109/77   Pulse: 69 67 72 75  Resp: '18 16 16 19  '$ Temp: 98.4 F (36.9 C) 98 F (36.7 C) (!) 97.5 F (36.4 C) 97.6 F (36.4 C)  TempSrc: Oral Oral Oral Oral  SpO2: 98% 96% 99%   Weight:      Height:        Intake/Output Summary (Last 24 hours) at 03/10/2022 1103 Last data filed at 03/09/2022 2027 Gross per 24 hour  Intake 480 ml  Output --  Net 480 ml      03/08/2022    5:20 PM 01/21/2022   10:08 PM 10/05/2021    3:33 PM  Last 3 Weights  Weight (lbs) 112 lb 114 lb 119 lb 9.6 oz  Weight (kg) 50.803 kg 51.71 kg 54.25 kg     Body mass index is 21.87 kg/m.  General:  Well nourished, well developed, in no acute distress HEENT: Blind Neck: no JVD Vascular: No carotid bruits; Distal pulses 2+ bilaterally Cardiac:  normal S1, S2; RRR; no murmur  Lungs:  clear to auscultation bilaterally, no wheezing, rhonchi or rales  Abd: soft, nontender, no hepatomegaly  Ext: no edema Musculoskeletal:  No deformities, BUE and BLE strength normal and equal Skin: warm and dry  Neuro:  CNs 2-12 intact, no focal abnormalities noted Psych:  Normal affect   EKG:  Jessica EKG was personally reviewed and demonstrates: Normal sinus rhythm Telemetry:  Telemetry was personally reviewed and demonstrates: Sinus rhythm  Relevant CV Studies:  Echocardiogram 12/2019: 1. Left ventricular ejection fraction, by estimation, is 50 to 55%. Jessica  left ventricle has low normal  function. Jessica left ventricle demonstrates  regional wall motion abnormalities (see scoring diagram/findings for  description). There is mild left  ventricular hypertrophy. Left ventricular diastolic parameters are  consistent with Grade I diastolic dysfunction (impaired relaxation). There  is moderate hypokinesis of Jessica left ventricular, entire inferior wall,  apical segment and inferoseptal wall.   2. Right ventricular systolic function is normal. Jessica right ventricular  size is normal.   3. Jessica mitral valve is grossly normal. No evidence of mitral valve  regurgitation.   4. Jessica aortic valve is tricuspid. Aortic valve regurgitation is not  visualized.   5. Jessica inferior vena cava is normal in size with greater than 50%  respiratory variability, suggesting right atrial pressure of 3 mmHg.    LHC 12/2019: Prox LAD lesion is 30% stenosed. 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 Post intervention, there is a 5% residual stenosis.   Mild nonobstructive stenosis of 30% in Jessica proximal LAD in Jessica region of Jessica first diagonal and septal perforating artery.   Normal ramus intermediate and left circumflex coronary arteries.   Dominant RCA with total mid distal occlusion of Jessica PDA vessel.   LVEDP 22 mmHg.   RECOMMENDATION: DAPT initially but since no stenting was performed duration may be less than 1 year.  Will DC bivalirudin several hours post procedure.  Hydrate.  Aggressive lipid-lowering therapy with target LDL less than 70 and optimal blood pressure control.   Diagnostic Dominance: Right Intervention       Laboratory Data:  High Sensitivity Troponin:   Recent Labs  Lab 03/08/22 1845 03/08/22 1933 03/08/22 2326  TROPONINIHS 18* 21* 25*     Chemistry Recent Labs  Lab 03/08/22 1845 03/09/22 0635  NA 134* 136  K 5.1 5.0  CL  102 106  CO2 23 19*  GLUCOSE 154* 114*  BUN 15 15  CREATININE 1.77* 1.69*  CALCIUM 9.3 8.8*  MG 2.2  --   GFRNONAA 30* 32*  ANIONGAP 9 11    Recent  Labs  Lab 03/08/22 1845 03/09/22 0635  PROT 7.3 6.6  ALBUMIN 4.1 3.7  AST 43* 39  ALT 52* 45*  ALKPHOS 43 39  BILITOT 0.5 0.4   Hematology Recent Labs  Lab 03/08/22 1845 03/09/22 0230  WBC 9.9 7.9  RBC 4.16 4.09  HGB 13.4 12.7  HCT 38.6 38.9  MCV 92.8 95.1  MCH 32.2 31.1  MCHC 34.7 32.6  RDW 12.4 12.6  PLT 288 268   Thyroid  Recent Labs  Lab 03/08/22 1732  TSH 4.248    Radiology/Studies:  DG Chest 2 View  Result Date: 03/08/2022 CLINICAL DATA:  Jessica [name] reports that she has a history of SVT and feels that she has been having palpitations intermittently over Jessica last few weeks. Jessica [name] last felt this earlier this morning and is concerned she may have been in SVT. EXAM: CHEST - 2 VIEW COMPARISON:  03/01/2021 FINDINGS: Lungs are clear. Heart size and mediastinal contours are within normal limits. No effusion. Visualized bones unremarkable.  Cholecystectomy clips. IMPRESSION: No acute cardiopulmonary disease. Electronically Signed   By: Lucrezia Europe M.D.   On: 03/08/2022 18:09     Assessment and Plan:   Palpitation, fluttering sensation -She suddenly become flushed with diaphoresis, fatigue, arm pain and palpitation.  Blood pressure elevated in 190/110s but HR in 80s.  -Maintaining sinus rhythm on monitor since on floor.  Telemetry did not show to ER data. -EKG without acute changes -Continue Toprol-XL 50 mg daily (home dose) -TSH normal -Likely outpatient monitor and echo but will defer to MD.   2.  Elevated troponin -High-sensitivity troponin 18>>21>>25 -Likely demand with underlying CKD  3.  CAD s/p angioplasty to distal PDA in 2021 -No exertional chest pain, shortness of breath or fatigue -Continue aspirin, statin and beta-blocker  4.  CKD stage IIIa -Renal function stable  5.  Diabetes mellitus -Per primary team  For questions or updates, please contact [place] Please consult [email or web address] for contact info under    Jarrett Soho,  PA  03/10/2022 11:03 AM   History and all data above reviewed.  Jessica [name] examined.  I agree with Jessica findings as above.  Jessica [name] reports several weeks or longer of episodes where her blood pressure goes up.  She becomes flushed and has a strange feeling of Tingling in her arms and overall body fatigue.  Blood pressure will go up to 170s 180s.  She is not describing tachypalpitations at Jessica same time.  It comes on sporadically.  She said it was happening couple times a week.  Sound like she came to Jessica ER because it was higher blood pressure and she was just worried about it.  It is not like her previous SVT.  It is not like her previous anginal pain.  Prior to that she has not been having any symptoms in between.  She has been able to do things like walk uphill and her driveway without bringing on any symptoms.  She has not had any presyncope or syncope.  She is not describing substernal chest pain.  She did not have any acute EKG changes.  Troponin enzymes minimally elevated and flat trend.  Of note she has progressive blindness.  Her husband is also visually impaired.  Jessica Jessica [name] exam  reveals COR: Regular rate and rhythm, no rubs, no murmurs,  Lungs: Clear to auscultation bilaterally,  Abd: Positive bowel sounds normal frequency pitch, bruits, rebound or guarding, Ext 2+ pulses, no edema..  All available labs, radiology testing, previous records reviewed. Agree with documented assessment and plan.  Dizziness/arm tingling/acute fatigue: Jessica symptoms are somewhat vague.  I do not think this represents acute coronary syndrome.  It could represent an arrhythmia and we should arrange a 4-week monitor.  Also I have suggested that she could get hydralazine 10 mg as needed systolic blood pressure greater than 160.  We will arrange follow-up.  Instructed on keeping a blood pressure diary.  At this point I do not think ischemia testing is indicated.  Jeneen Rinks Sherran Margolis  2:17 PM  03/10/2022

## 2022-03-10 NOTE — Care Management Obs Status (Signed)
[place] NOTIFICATION   Patient Details  Name: Jessica [name] MRN: [id number] Date of Birth: July 13, 1951   Medicare Observation Status Notification Given:  Yes    Bethena Roys, RN 03/10/2022, 10:08 AM

## 2022-03-10 NOTE — Discharge Summary (Signed)
Discharge Summary  Jessica [name] QJJ:[id number] DOB: [birth year]  PCP: Ginger Organ., MD  Admit date: 03/08/2022 Discharge date: 03/10/2022    Time spent: 39mns, more than 50% time spent on coordination of care.   Recommendations for Outpatient Follow-up:  F/u with PCP within a week  for hospital discharge follow up, repeat cbc/cmp at follow up F/u with cardiology for outpatient cardiac monitor Patient is advised to check blood pressure at home and bring in record to follow up appointments   Discharge Diagnoses:  Active Hospital Problems   Diagnosis Date Noted   Palpitations 03/08/2022   Hypertension 01/14/2020   CKD (chronic kidney disease), stage IV ([place] 01/14/2020   Anxiety 01/14/2020   Type 2 diabetes mellitus with complication, without long-term current use of insulin ([place] 11/15/2007   Hypothyroidism 11/15/2007   Hyperlipidemia LDL goal <70 11/15/2007    Resolved Hospital Problems  No resolved problems to display.    Discharge Condition: stable  Diet recommendation: heart healthy/carb modified  Filed Weights   03/08/22 1720  Weight: 50.8 kg    History of present illness: (Per admitting MD Dr. OMyna Hidalgo Chief Complaint: Palpitations, weakness    HPI: Jessica WHITMIREis a pleasant 71y.o. female with medical history significant for hypertension, hyperlipidemia, CKD 4, anxiety, GERD, and CAD status post balloon angioplasty of RPDA in June 2021, now presenting to the emergency department with recurrent episodes of palpitations.  Patient describes recurrent episodes over the past 6 weeks marked by palpitations, general weakness, and mild nausea.  She reports that her blood pressure is increased to 170s over 100 during these episodes but is unsure of her heart rate.  Symptoms lasted approximately an hour before resolving and she is unable to identify any alleviating or provoking factors.  She has taken 1/2 tablet of Toprol during some of these episodes but it did not  seem to speed resolution.  She denies any leg swelling, chest pain, diaphoresis, fever, or chills.   ED Course: Upon arrival to the ED, patient is found to be afebrile and saturating well on room air with stable blood pressure.  EKG features sinus rhythm and chest x-ray is negative for acute cardiopulmonary disease.  Chemistry panel notable for creatinine 1.77 and slight elevation in transaminases.  Troponin was 18 and then 21.  Patient was treated with Zofran in the ED.    Hospital Course:  Principal Problem:   Palpitations Active Problems:   Hypothyroidism   Type 2 diabetes mellitus with complication, without long-term current use of insulin (HCC)   Hyperlipidemia LDL goal <70   Hypertension   CKD (chronic kidney disease), stage IV (HCC)   Anxiety   Palpitation with mild troponin elevation -Denies chest pain -Telemetry unremarkable in the hospital -Seen by cardiology recommend outpatient follow-up and outpatient heart monitor  CAD  Stable on home meds Seen by cardiology Close follow-up with cardiology  HTN Continue home medication, new prescription of as needed hydralazine provided for SBP greater than 160 per cardiology recommendation   Non-insulin-dependent type 2 diabetes A1c 7.1 at goal per geriatric Society recommendation Stable on home medication including Trulicity and Jardiance  Legally blind, at baseline Did well with PT  Discharge Exam: BP 129/83 (BP Location: Left Arm)   Pulse 82   Temp 97.7 F (36.5 C) (Oral)   Resp 16   Ht 5' (1.524 m)   Wt 50.8 kg   SpO2 99%   BMI 21.87 kg/m   General: NAD, aaox3 Cardiovascular: RRR  Respiratory: Normal respiratory effort    Discharge Instructions     Diet - low sodium heart healthy   Complete by: As directed    Increase activity slowly   Complete by: As directed       Allergies as of 03/10/2022       Reactions   Codeine Shortness Of Breath, Nausea And Vomiting   Erythromycin Nausea And Vomiting    Levaquin [levofloxacin] Shortness Of Breath   Tramadol Nausea And Vomiting        Medication List     STOP taking these medications    fenofibrate 145 MG tablet Commonly known as: TRICOR       TAKE these medications    acetaminophen 325 MG tablet Commonly known as: TYLENOL Take 2 tablets (650 mg total) by mouth every 4 (four) hours as needed for mild pain. What changed:  how much to take when to take this   aspirin EC 81 MG tablet Take 1 tablet (81 mg total) by mouth daily. Swallow whole.   Co Q 10 100 MG Caps Take 100 mg by mouth daily.   cyanocobalamin 1000 MCG/ML injection Commonly known as: VITAMIN B12 Inject 1,000 mcg into the muscle every 30 (thirty) days.   empagliflozin 10 MG Tabs tablet Commonly known as: JARDIANCE Take 1 tablet (10 mg total) by mouth daily.   famotidine 40 MG tablet Commonly known as: Pepcid Take 1 tablet (40 mg total) by mouth 2 (two) times daily.   hydrALAZINE 10 MG tablet Commonly known as: APRESOLINE Take 1 tablet (10 mg total) by mouth 3 (three) times daily as needed. hydralazine 10 mg as needed systolic blood pressure greater than 160.   Please  keep a blood pressure diary.   levothyroxine 50 MCG tablet Commonly known as: SYNTHROID Take 50 mcg by mouth daily before breakfast.   LORazepam 0.5 MG tablet Commonly known as: ATIVAN Take 0.5 mg by mouth 2 (two) times daily as needed for anxiety.   magnesium oxide 400 MG tablet Commonly known as: MAG-OX Take 400 mg by mouth daily.   metoprolol succinate 50 MG 24 hr tablet Commonly known as: TOPROL-XL Take 50 mg by mouth daily. What changed: Another medication with the same name was removed. Continue taking this medication, and follow the directions you see here.   nitroGLYCERIN 0.4 MG SL tablet Commonly known as: NITROSTAT Place 1 tablet (0.4 mg total) under the tongue every 5 (five) minutes x 3 doses as needed for chest pain.   pantoprazole 40 MG tablet Commonly known  as: PROTONIX Take 40 mg by mouth daily.   rosuvastatin 40 MG tablet Commonly known as: CRESTOR TAKE 1 TABLET AT BEDTIME What changed: when to take this   sertraline 50 MG tablet Commonly known as: ZOLOFT Take 50 mg by mouth at bedtime.   Trulicity 1.5 QI/2.9NL Sopn Generic drug: Dulaglutide Inject 1.5 mg into the skin once a week.       Allergies  Allergen Reactions   Codeine Shortness Of Breath and Nausea And Vomiting   Erythromycin Nausea And Vomiting   Levaquin [Levofloxacin] Shortness Of Breath   Tramadol Nausea And Vomiting    Follow-up Information     Troy Sine, MD Follow up.   Specialty: Cardiology Why: office will call with monitor set up and appointment Contact information: [place] [address] [phone]         Ginger Organ., MD Follow up in 1 week(s).  Specialty: Internal Medicine Why: hospital discharge follow up, pcp to repeat basic lab works inculiding cbc/cmp Contact information: [place] [address] [phone]                  The results of significant diagnostics from this hospitalization (including imaging, microbiology, ancillary and laboratory) are listed below for reference.    Significant Diagnostic Studies: DG Chest 2 View  Result Date: 03/08/2022 CLINICAL DATA:  Patient reports that she has a history of SVT and feels that she has been having palpitations intermittently over the last few weeks. Patient last felt this earlier this morning and is concerned she may have been in SVT. EXAM: CHEST - 2 VIEW COMPARISON:  03/01/2021 FINDINGS: Lungs are clear. Heart size and mediastinal contours are within normal limits. No effusion. Visualized bones unremarkable.  Cholecystectomy clips. IMPRESSION: No acute cardiopulmonary disease. Electronically Signed   By: Lucrezia Europe M.D.   On: 03/08/2022 18:09    Microbiology: Recent Results (from the past 240 hour(s))  Urine Culture      Status: Abnormal   Collection Time: 03/08/22 10:03 PM   Specimen: Urine, Clean Catch  Result Value Ref Range Status   Specimen Description URINE, CLEAN CATCH  Final   Special Requests   Final    NONE Performed at [facility], [address], [place], [place] [address]    Culture (A)  Final    20,000 COLONIES/mL MULTIPLE SPECIES PRESENT, SUGGEST RECOLLECTION   Report Status 03/10/2022 FINAL  Final     Labs: Basic Metabolic Panel: Recent Labs  Lab 03/08/22 1845 03/09/22 0635  NA 134* 136  K 5.1 5.0  CL 102 106  CO2 23 19*  GLUCOSE 154* 114*  BUN 15 15  CREATININE 1.77* 1.69*  CALCIUM 9.3 8.8*  MG 2.2  --    Liver Function Tests: Recent Labs  Lab 03/08/22 1845 03/09/22 0635  AST 43* 39  ALT 52* 45*  ALKPHOS 43 39  BILITOT 0.5 0.4  PROT 7.3 6.6  ALBUMIN 4.1 3.7   Recent Labs  Lab 03/08/22 1845  LIPASE 54*   No results for input(s): "AMMONIA" in the last 168 hours. CBC: Recent Labs  Lab 03/08/22 1845 03/09/22 0230  WBC 9.9 7.9  NEUTROABS 7.1  --   HGB 13.4 12.7  HCT 38.6 38.9  MCV 92.8 95.1  PLT 288 268   Cardiac Enzymes: No results for input(s): "CKTOTAL", "CKMB", "CKMBINDEX", "TROPONINI" in the last 168 hours. BNP: BNP (last 3 results) No results for input(s): "BNP" in the last 8760 hours.  ProBNP (last 3 results) No results for input(s): "PROBNP" in the last 8760 hours.  CBG: Recent Labs  Lab 03/09/22 1132 03/09/22 1723 03/09/22 2135 03/10/22 0757 03/10/22 1203  GLUCAP 140* 136* 144* 172* 89    FURTHER DISCHARGE INSTRUCTIONS:   Get Medicines reviewed and adjusted: Please take all your medications with you for your next visit with your Primary MD   Laboratory/radiological data: Please request your Primary MD to go over all hospital tests and procedure/radiological results at the follow up, please ask your Primary MD to get all Hospital records sent to his/her office.   In some cases, they will be blood work, cultures and  biopsy results pending at the time of your discharge. Please request that your primary care M.D. goes through all the records of your hospital data and follows up on these results.   Also Note the following: If you experience worsening  of your admission symptoms, develop shortness of breath, life threatening emergency, suicidal or homicidal thoughts you must seek medical attention immediately by calling 911 or calling your MD immediately  if symptoms less severe.   You must read complete instructions/literature along with all the possible adverse reactions/side effects for all the Medicines you take and that have been prescribed to you. Take any new Medicines after you have completely understood and accpet all the possible adverse reactions/side effects.    Do not drive when taking Pain medications or sleeping medications (Benzodaizepines)   Do not take more than prescribed Pain, Sleep and Anxiety Medications. It is not advisable to combine anxiety,sleep and pain medications without talking with your primary care practitioner   Special Instructions: If you have smoked or chewed Tobacco  in the last 2 yrs please stop smoking, stop any regular Alcohol  and or any Recreational drug use.   Wear Seat belts while driving.   Please note: You were cared for by a hospitalist during your hospital stay. Once you are discharged, your primary care physician will handle any further medical issues. Please note that NO REFILLS for any discharge medications will be authorized once you are discharged, as it is imperative that you return to your primary care physician (or establish a relationship with a primary care physician if you do not have one) for your post hospital discharge needs so that they can reassess your need for medications and monitor your lab values.     Signed:  Florencia Reasons MD, PhD, FACP  [facility] 03/10/2022, 7:47 PM

## 2022-03-10 NOTE — Care Management (Signed)
  Transition of Care [facility]) Screening Note   Patient Details  Name: Jessica [name] Date of Birth: [birth year]   [facility] CM/SW Contact:    Bethena Roys, RN Phone Number: 03/10/2022, 10:13 AM    Transition of Care Department [facility]) has reviewed the patient and no TOC needs have been identified at this time. Patient presented for palpitations and weakness. PTA patient was home with spouse who is also legally blind. Patient reports that she has a PCP and has transportation to appointments. Daughter usually gets groceries for the patient and the patient prepares meals. No home needs identified at this time. Patient has transportation home via pastor.

## 2022-03-10 NOTE — Evaluation (Addendum)
Physical Therapy Evaluation Patient Details Name: Jessica [name] MRN: [id number] DOB: Jun 25, 1951 Today's Date: 03/10/2022  History of Present Illness  71 y.o. female presented to ED 8/15 for the evaluation of palpitations/Elevated troponin. EKG features sinus rhythm and chest x-ray is negative for acute cardiopulmonary disease. Admitted for observation  hx of CAD s/p STEMI 12/2019 s/p balloon angioplasty to dPDA, HTN, HLD, DM type 2, hypothyroidism, CKD stage3 and blindness since birth  Clinical Impression  PTA pt living with husband in multistory home with 2 steps to enter, and bed and bath on main level. Pt reports independence in mobility in her home environment and use of mobility cane in community. Pt independent in ADLs/iADLs, daughter provides transportation. Pt is currently mod I for mobility, PT providing increased tactile and verbal cuing for navigation of unfamiliar environment, no physical assist required. Vital signs within normal limits. Pt reports legs feel weaker and more tired than normal. PT inquired as to recommendation of HHPT and pt reports that she has exercises from last time they came to house. PT recommended resumption of LE exercises and gradual resumption of walking program she and her husband preform daily. Pt agreed to plan. Pt has no further PT needs at discharge.  PT signing off.      Recommendations for follow up therapy are one component of a multi-disciplinary discharge planning process, led by the attending physician.  Recommendations may be updated based on patient status, additional functional criteria and insurance authorization.  Follow Up Recommendations No PT follow up      Assistance Recommended at Discharge PRN  Patient can return home with the following  Assist for transportation    Equipment Recommendations None recommended by PT     Functional Status Assessment Patient has had a recent decline in their functional status and demonstrates the ability  to make significant improvements in function in a reasonable and predictable amount of time.     Precautions / Restrictions Precautions Precautions: Fall Precaution Comments: in unfamiliar surroundings due to visual impairments Restrictions Weight Bearing Restrictions: No      Mobility  Bed Mobility Overal bed mobility: Modified Independent             General bed mobility comments: HoB elevated, use of bedrail to come to EoB, verbal cues for surroundings no physical assist provided    Transfers Overall transfer level: Modified independent Equipment used: 1 person hand held assist               General transfer comment: 1 person hand hold assist and verbal cuing to stand from bed, physical asist not provided, able to use hand rail to stand from low toilet    Ambulation/Gait Ambulation/Gait assistance: Modified independent (Device/Increase time) Gait Distance (Feet): 80 Feet Assistive device: 1 person hand held assist Gait Pattern/deviations: Step-through pattern, Decreased step length - right, Decreased step length - left, Drifts right/left Gait velocity: slowed Gait velocity interpretation: <1.8 ft/sec, indicate of risk for recurrent falls   General Gait Details: 1 person hand hold assist and verbal cuing to navigate surroundings, physical asist not provided     Balance Overall balance assessment: Needs assistance Sitting-balance support: Feet supported, Bilateral upper extremity supported Sitting balance-Leahy Scale: Normal     Standing balance support: No upper extremity supported, Single extremity supported, Bilateral upper extremity supported Standing balance-Leahy Scale: Good Standing balance comment: good static standing, benefits from UE support with dynamic activities  Pertinent Vitals/Pain Pain Assessment Pain Assessment: No/denies pain    Home Living Family/patient expects to be discharged to:: Private  residence Living Arrangements: Spouse/significant other;Children Available Help at Discharge: Family Type of Home: House Home Access: Stairs to enter   Technical brewer of Steps: 2   Home Layout: Two level;Able to live on main level with bedroom/bathroom Home Equipment: Cane - quad;Grab bars - tub/shower;Hand held shower head      Prior Function Prior Level of Function : Independent/Modified Independent             Mobility Comments: independent in mobility, uses mobility cane to assess unfamiliar surroundings ADLs Comments: independent with ADLs and iADLs, daughter provides transportation     Hand Dominance   Dominant Hand: Right    Extremity/Trunk Assessment   Upper Extremity Assessment Upper Extremity Assessment: Overall WFL for tasks assessed    Lower Extremity Assessment Lower Extremity Assessment: Overall WFL for tasks assessed    Cervical / Trunk Assessment Cervical / Trunk Assessment: Normal  Communication   Communication: No difficulties  Cognition Arousal/Alertness: Awake/alert Behavior During Therapy: WFL for tasks assessed/performed Overall Cognitive Status: Within Functional Limits for tasks assessed                                          General Comments General comments (skin integrity, edema, etc.): max noted HR with ambulation 88bpm, BP after ambulation 132/75, SpO2 on RA 93%O2        Assessment/Plan    PT Assessment Patient does not need any further PT services         PT Goals (Current goals can be found in the Care Plan section)  Acute Rehab PT Goals Patient Stated Goal: get back to walking PT Goal Formulation: All assessment and education complete, DC therapy     AM-PAC PT "6 Clicks" Mobility  Outcome Measure Help needed turning from your back to your side while in a flat bed without using bedrails?: None Help needed moving from lying on your back to sitting on the side of a flat bed without using  bedrails?: None Help needed moving to and from a bed to a chair (including a wheelchair)?: A Little Help needed standing up from a chair using your arms (e.g., wheelchair or bedside chair)?: None Help needed to walk in hospital room?: A Little Help needed climbing 3-5 steps with a railing? : A Little 6 Click Score: 21    End of Session Equipment Utilized During Treatment: Gait belt Activity Tolerance: Patient tolerated treatment well Patient left: in chair;with call bell/phone within reach Nurse Communication: Mobility status PT Visit Diagnosis: Muscle weakness (generalized) (M62.81)    Time: 1517-6160 PT Time Calculation (min) (ACUTE ONLY): 21 min   Charges:   PT Evaluation $PT Eval Low Complexity: 1 Low          Gabrielly Mccrystal B. Migdalia Dk PT, DPT Acute Rehabilitation Services Please use secure chat or  Call Office [phone]   [place] 03/10/2022, 1:20 PM

## 2022-03-16 DIAGNOSIS — I129 Hypertensive chronic kidney disease with stage 1 through stage 4 chronic kidney disease, or unspecified chronic kidney disease: Secondary | ICD-10-CM | POA: Diagnosis not present

## 2022-03-16 DIAGNOSIS — E1129 Type 2 diabetes mellitus with other diabetic kidney complication: Secondary | ICD-10-CM | POA: Diagnosis not present

## 2022-03-16 DIAGNOSIS — E785 Hyperlipidemia, unspecified: Secondary | ICD-10-CM | POA: Diagnosis not present

## 2022-03-16 DIAGNOSIS — N184 Chronic kidney disease, stage 4 (severe): Secondary | ICD-10-CM | POA: Diagnosis not present

## 2022-03-16 DIAGNOSIS — E039 Hypothyroidism, unspecified: Secondary | ICD-10-CM | POA: Diagnosis not present

## 2022-03-16 DIAGNOSIS — R300 Dysuria: Secondary | ICD-10-CM | POA: Diagnosis not present

## 2022-03-16 DIAGNOSIS — N390 Urinary tract infection, site not specified: Secondary | ICD-10-CM | POA: Diagnosis not present

## 2022-03-16 DIAGNOSIS — R002 Palpitations: Secondary | ICD-10-CM | POA: Diagnosis not present

## 2022-03-16 DIAGNOSIS — I2510 Atherosclerotic heart disease of native coronary artery without angina pectoris: Secondary | ICD-10-CM | POA: Diagnosis not present

## 2022-03-18 ENCOUNTER — Telehealth: Payer: Self-pay | Admitting: Cardiovascular Disease

## 2022-03-18 NOTE — Telephone Encounter (Signed)
Pt is requesting a call back to discuss when she should wear her heart monitor. Please advise.

## 2022-03-18 NOTE — Telephone Encounter (Signed)
Spoke with pt regarding recent event monitor ordered for pt prior to leaving the hospital. Pt received monitor today and was calling to see if she could place the monitor today. Explained that she could start today if she wanted and that she should wear the monitor as much as possible for the next 4 weeks in order to get as much information as possible. Explained that if we can see the rhythm when she is having palpitations we can better take care of them. Pt verbalizes understanding.

## 2022-03-19 ENCOUNTER — Ambulatory Visit: Payer: Medicare HMO | Attending: Cardiovascular Disease

## 2022-03-19 DIAGNOSIS — R002 Palpitations: Secondary | ICD-10-CM | POA: Diagnosis not present

## 2022-03-22 ENCOUNTER — Other Ambulatory Visit: Payer: Self-pay | Admitting: Physician Assistant

## 2022-04-07 NOTE — Progress Notes (Deleted)
Cardiology Office Note:    Date:  04/07/2022   ID:  Jessica [name], DOB [birth year], MRN [id number]  PCP:  Ginger Organ., MD   [place] Prov[facility] Cardiologist:  Shelva Majestic, MD { Click to update primary MD,subspecialty MD or APP then REFRESH:1}    Referring MD: Ginger Organ., MD   No chief complaint on file. ***  History of Present Illness:    Jessica [name] is a 71 y.o. female with a hx of CAD s/p STEMI 12/2019 treated with balloon angioplasty to dPDA, hypertension, hyperlipidemia, DM 2, hypothyroidism, CKD 3 and blindness since birth.  A 30% stenosis in the proximal LAD was managed medically.  Echocardiogram 12/31/2019 in the setting of STEMI demonstrated LVEF 76-28%, grade 1 diastolic dysfunction, WMA with hypokinesis of the left ventricular entire inferior wall, apical segment and inferior septal wall, normal RV, and no significant valvular disease.  She was placed on aspirin and Brilinta.  Antianginals were titrated following her STEMI for chest pain.  Unfortunately, she was walking through her kitchen and accidentally struck her abdomen against the corner of the countertop resulting in a large hematoma secondary to blunt force trauma.  She was subsequently taken off of Brilinta, aspirin was continued.  She was last seen by Dr. Claiborne Billings 10/05/2021 and was doing well at that time.  She was recently hospitalized 03/08/2022 for palpitations, general weakness, and mild nausea.  Taking an extra Toprol during episodes of palpitation did not change her palpitations.  Cardiac enzymes were negative.  Telemetry was unremarkable in the hospital.  She presents today for routine follow up.    CAD  STEMI s/p PCI dPDA ASA   Palpitations   Hypertension   Hyperlipidemia       Past Medical History:  Diagnosis Date   Anemia    Anxiety    CKD (chronic kidney disease)    CKD III per PCP notes-pt denies   Congenital blindness    Diabetes mellitus    Diverticula of  small intestine    Esophageal stricture    GERD (gastroesophageal reflux disease)    H/O hiatal hernia    Hyperlipidemia    Hypertension    Hypothyroidism    Iron deficiency anemia    Osteopenia    Osteoporosis    Postoperative nausea and vomiting    Retinitis    STEMI (ST elevation myocardial infarction) ([place])    SVT (supraventricular tachycardia) ([place])    Vitamin B 12 deficiency     Past Surgical History:  Procedure Laterality Date   BACK SURGERY     CARPAL TUNNEL RELEASE     Right    CESAREAN SECTION     CHOLECYSTECTOMY  1985   COLONOSCOPY     CORONARY BALLOON ANGIOPLASTY N/A 12/30/2019   Procedure: CORONARY BALLOON ANGIOPLASTY;  Surgeon: Troy Sine, MD;  Location: [facility];  Service: Cardiovascular;  Laterality: N/A;   CORONARY/GRAFT ACUTE MI REVASCULARIZATION N/A 12/30/2019   Procedure: Coronary/Graft Acute MI Revascularization;  Surgeon: Troy Sine, MD;  Location: [facility];  Service: Cardiovascular;  Laterality: N/A;   HERNIA REPAIR     LEFT HEART CATH AND CORONARY ANGIOGRAPHY N/A 12/30/2019   Procedure: LEFT HEART CATH AND CORONARY ANGIOGRAPHY;  Surgeon: Troy Sine, MD;  Location: [facility];  Service: Cardiovascular;  Laterality: N/A;   Right knee arthroscopy  1993   Right thyroidectomy  2007   TOTAL ABDOMINAL HYSTERECTOMY W/ BILATERAL SALPINGOOPHORECTOMY  1993   UPPER GASTROINTESTINAL ENDOSCOPY      Current Medications: No outpatient medications have been marked as taking for the 04/12/22 encounter (Appointment) with Ledora Bottcher, [place].     Allergies:   Codeine, Erythromycin, Levaquin [levofloxacin], and Tramadol   Social History   Socioeconomic History   Marital status: Married    Spouse name: Not on file   Number of children: 4   Years of education: Not on file   Highest education level: Not on file  Occupational History   Occupation: house wife    Occupation: retired    Fish farm manager: [employer]   Tobacco Use   Smoking status: Never   Smokeless tobacco: Never  Vaping Use   Vaping Use: Never used  Substance and Sexual Activity   Alcohol use: No   Drug use: No   Sexual activity: Not Currently  Other Topics Concern   Not on file  Social History Narrative   ** Merged History Encounter **       Social Determinants of Health   Financial Resource Strain: Not on file  Food Insecurity: Not on file  Transportation Needs: Not on file  Physical Activity: Not on file  Stress: Not on file  Social Connections: Not on file     Family History: The patient's ***family history includes COPD in her father; CVA in her mother; Coronary artery disease in her father; Diabetes Mellitus II in her maternal grandmother and mother; Heart disease in her mother; Hyperlipidemia in her sister; Hypertension in her father, mother, sister, and sister; Uterine cancer in her sister. There is no history of Colon cancer, Rectal cancer, Stomach cancer, or Esophageal cancer.  ROS:   Please see the history of present illness.    *** All other systems reviewed and are negative.  EKGs/Labs/Other Studies Reviewed:    The following studies were reviewed today: ***  EKG:  EKG is *** ordered today.  The ekg ordered today demonstrates ***  Recent Labs: 03/08/2022: Magnesium 2.2; TSH 4.248 03/09/2022: ALT 45; BUN 15; Creatinine, Ser 1.69; Hemoglobin 12.7; Platelets 268; Potassium 5.0; Sodium 136  Recent Lipid Panel    Component Value Date/Time   CHOL 116 12/31/2019 0325   TRIG 216 (H) 12/31/2019 0325   HDL 26 (L) 12/31/2019 0325   CHOLHDL 4.5 12/31/2019 0325   VLDL 43 (H) 12/31/2019 0325   LDLCALC 47 12/31/2019 0325     Risk Assessment/Calculations:   {Does this patient have ATRIAL FIBRILLATION?[phone]}  No BP recorded.  {Refresh Note OR Click here to enter BP  :1}***         Physical Exam:    VS:  There were no vitals taken for this visit.    Wt Readings from Last 3 Encounters:  03/08/22  112 lb (50.8 kg)  01/21/22 114 lb (51.7 kg)  10/05/21 119 lb 9.6 oz (54.3 kg)     GEN: *** Well nourished, well developed in no acute distress HEENT: Normal NECK: No JVD; No carotid bruits LYMPHATICS: No lymphadenopathy CARDIAC: ***RRR, no murmurs, rubs, gallops RESPIRATORY:  Clear to auscultation without rales, wheezing or rhonchi  ABDOMEN: Soft, non-tender, non-distended MUSCULOSKELETAL:  No edema; No deformity  SKIN: Warm and dry NEUROLOGIC:  Alert and oriented x 3 PSYCHIATRIC:  Normal affect   ASSESSMENT:    No diagnosis found. PLAN:    In order of problems listed above:  ***      {Are you ordering a CV Procedure (e.g. stress test, cath, DCCV,  TEE, etc)?   Press F2        :[id number]}    Medication Adjustments/Labs and Tests Ordered: Current medicines are reviewed at length with the patient today.  Concerns regarding medicines are outlined above.  No orders of the defined types were placed in this encounter.  No orders of the defined types were placed in this encounter.   There are no Patient Instructions on file for this visit.   Signed, Ledora Bottcher, PA  04/07/2022 2:15 PM    [facility]

## 2022-04-12 ENCOUNTER — Ambulatory Visit: Payer: Medicare HMO | Admitting: Physician Assistant

## 2022-04-12 DIAGNOSIS — I10 Essential (primary) hypertension: Secondary | ICD-10-CM | POA: Diagnosis not present

## 2022-04-12 DIAGNOSIS — E785 Hyperlipidemia, unspecified: Secondary | ICD-10-CM | POA: Diagnosis not present

## 2022-04-12 DIAGNOSIS — N184 Chronic kidney disease, stage 4 (severe): Secondary | ICD-10-CM | POA: Diagnosis not present

## 2022-04-14 DIAGNOSIS — E875 Hyperkalemia: Secondary | ICD-10-CM | POA: Diagnosis not present

## 2022-04-22 ENCOUNTER — Other Ambulatory Visit (HOSPITAL_COMMUNITY): Payer: Self-pay | Admitting: *Deleted

## 2022-04-26 ENCOUNTER — Ambulatory Visit (HOSPITAL_COMMUNITY)
Admission: RE | Admit: 2022-04-26 | Discharge: 2022-04-26 | Disposition: A | Discharge status: Home / Self Care | Payer: Medicare HMO | Source: Ambulatory Visit | Attending: Internal Medicine | Admitting: Internal Medicine

## 2022-04-26 DIAGNOSIS — M810 Age-related osteoporosis without current pathological fracture: Secondary | ICD-10-CM | POA: Diagnosis not present

## 2022-04-26 MED ORDER — DENOSUMAB 60 MG/ML ~~LOC~~ SOSY
60.0000 mg | PREFILLED_SYRINGE | Freq: Once | SUBCUTANEOUS | Status: AC
  Administered 2022-04-26: 60 mg via SUBCUTANEOUS

## 2022-04-26 MED ORDER — DENOSUMAB 60 MG/ML ~~LOC~~ SOSY
PREFILLED_SYRINGE | SUBCUTANEOUS | Status: AC
  Filled 2022-04-26: qty 1

## 2022-05-25 DIAGNOSIS — I2510 Atherosclerotic heart disease of native coronary artery without angina pectoris: Secondary | ICD-10-CM | POA: Diagnosis not present

## 2022-05-25 DIAGNOSIS — Z23 Encounter for immunization: Secondary | ICD-10-CM | POA: Diagnosis not present

## 2022-05-25 DIAGNOSIS — E039 Hypothyroidism, unspecified: Secondary | ICD-10-CM | POA: Diagnosis not present

## 2022-05-25 DIAGNOSIS — E1129 Type 2 diabetes mellitus with other diabetic kidney complication: Secondary | ICD-10-CM | POA: Diagnosis not present

## 2022-05-25 DIAGNOSIS — N184 Chronic kidney disease, stage 4 (severe): Secondary | ICD-10-CM | POA: Diagnosis not present

## 2022-05-25 DIAGNOSIS — E538 Deficiency of other specified B group vitamins: Secondary | ICD-10-CM | POA: Diagnosis not present

## 2022-05-25 DIAGNOSIS — I129 Hypertensive chronic kidney disease with stage 1 through stage 4 chronic kidney disease, or unspecified chronic kidney disease: Secondary | ICD-10-CM | POA: Diagnosis not present

## 2022-05-25 DIAGNOSIS — D692 Other nonthrombocytopenic purpura: Secondary | ICD-10-CM | POA: Diagnosis not present

## 2022-05-25 DIAGNOSIS — Z9861 Coronary angioplasty status: Secondary | ICD-10-CM | POA: Diagnosis not present

## 2022-05-25 DIAGNOSIS — I700 Atherosclerosis of aorta: Secondary | ICD-10-CM | POA: Diagnosis not present

## 2022-05-25 DIAGNOSIS — I10 Essential (primary) hypertension: Secondary | ICD-10-CM | POA: Diagnosis not present

## 2022-07-27 ENCOUNTER — Telehealth: Payer: Self-pay | Admitting: Internal Medicine

## 2022-07-27 DIAGNOSIS — R1319 Other dysphagia: Secondary | ICD-10-CM

## 2022-07-27 NOTE — Telephone Encounter (Signed)
Dr. Henrene Pastor please see note below and advise if pt can be a direct EGD or does she need an OV?

## 2022-07-27 NOTE — Telephone Encounter (Signed)
Patient called, is having issues with swallowing. She states she can only swallow soft foods and can't get any hard foods down, she also states she has had a dilation in the past. I have schedule patient for an office visit with Dr. Henrene Pastor on 1/30. However, patient would like to be seen sooner. Please advise. Thank you.

## 2022-07-27 NOTE — Telephone Encounter (Signed)
Direct LEC this Wednesday 07-29-22 at 9:30 am. EGD with dilation. Thanks

## 2022-07-28 ENCOUNTER — Telehealth: Payer: Self-pay

## 2022-07-28 NOTE — Telephone Encounter (Signed)
Pt took her trulicity injection this am, she has to hold this for 3 days prior to procedure. Pt scheduled for EGD with dilation on 08/02/22 at 1:30pm, pt to arrive in [place] at 12:30pm. Pt knows to hold jardiance the morning of procedure. She knows to have clear liquids only up until 9:30am. Pt aware. Amb ref entered in epic. Dr. Henrene Pastor notified.

## 2022-07-29 ENCOUNTER — Encounter: Payer: Medicare HMO | Admitting: Internal Medicine

## 2022-07-30 ENCOUNTER — Telehealth: Payer: Self-pay | Admitting: Internal Medicine

## 2022-07-30 NOTE — Telephone Encounter (Signed)
Spoke with pt, she had questions regarding her diabetic meds. Reviewed instructions with pt over the phone and she knows to hold jardiance the day of and to hold trulicity the day of the procedure.

## 2022-07-30 NOTE — Telephone Encounter (Signed)
Patient called asking to speak with you about her upcoming procedure on Monday.  She said she had some questions.  Please call.

## 2022-08-02 ENCOUNTER — Ambulatory Visit (AMBULATORY_SURGERY_CENTER): Payer: Medicare HMO | Admitting: Internal Medicine

## 2022-08-02 ENCOUNTER — Ambulatory Visit: Payer: Medicare HMO | Attending: Physician Assistant | Admitting: Nurse Practitioner

## 2022-08-02 ENCOUNTER — Encounter: Payer: Self-pay | Admitting: Internal Medicine

## 2022-08-02 ENCOUNTER — Encounter: Payer: Self-pay | Admitting: Nurse Practitioner

## 2022-08-02 VITALS — BP 118/74 | HR 77 | Temp 96.8°F | Resp 14 | Ht 61.25 in | Wt 114.0 lb

## 2022-08-02 VITALS — BP 128/82 | HR 67 | Ht 60.0 in | Wt 111.0 lb

## 2022-08-02 DIAGNOSIS — E039 Hypothyroidism, unspecified: Secondary | ICD-10-CM

## 2022-08-02 DIAGNOSIS — R1319 Other dysphagia: Secondary | ICD-10-CM | POA: Diagnosis not present

## 2022-08-02 DIAGNOSIS — E118 Type 2 diabetes mellitus with unspecified complications: Secondary | ICD-10-CM

## 2022-08-02 DIAGNOSIS — I2510 Atherosclerotic heart disease of native coronary artery without angina pectoris: Secondary | ICD-10-CM | POA: Diagnosis not present

## 2022-08-02 DIAGNOSIS — R002 Palpitations: Secondary | ICD-10-CM

## 2022-08-02 DIAGNOSIS — I10 Essential (primary) hypertension: Secondary | ICD-10-CM | POA: Diagnosis not present

## 2022-08-02 DIAGNOSIS — K219 Gastro-esophageal reflux disease without esophagitis: Secondary | ICD-10-CM

## 2022-08-02 DIAGNOSIS — K222 Esophageal obstruction: Secondary | ICD-10-CM | POA: Diagnosis not present

## 2022-08-02 DIAGNOSIS — E785 Hyperlipidemia, unspecified: Secondary | ICD-10-CM | POA: Diagnosis not present

## 2022-08-02 DIAGNOSIS — N183 Chronic kidney disease, stage 3 unspecified: Secondary | ICD-10-CM | POA: Diagnosis not present

## 2022-08-02 DIAGNOSIS — E119 Type 2 diabetes mellitus without complications: Secondary | ICD-10-CM | POA: Diagnosis not present

## 2022-08-02 MED ORDER — HYDRALAZINE HCL 10 MG PO TABS: 10.0000 mg | ORAL_TABLET | Freq: Three times a day (TID) | ORAL | 3 refills | Status: DC | PRN

## 2022-08-02 MED ORDER — SODIUM CHLORIDE 0.9 % IV SOLN: 500.0000 mL | INTRAVENOUS | Status: DC

## 2022-08-02 NOTE — Patient Instructions (Signed)
YOU HAD AN ENDOSCOPIC PROCEDURE TODAY AT [facility]:   Refer to the procedure report that was given to you for any specific questions about what was found during the examination.  If the procedure report does not answer your questions, please call your gastroenterologist to clarify.  If you requested that your care partner not be given the details of your procedure findings, then the procedure report has been included in a sealed envelope for you to review at your convenience later.  YOU SHOULD EXPECT: Some feelings of bloating in the abdomen. Passage of more gas than usual.  Walking can help get rid of the air that was put into your GI tract during the procedure and reduce the bloating. If you had a lower endoscopy (such as a colonoscopy or flexible sigmoidoscopy) you may notice spotting of blood in your stool or on the toilet paper. If you underwent a bowel prep for your procedure, you may not have a normal bowel movement for a few days.  Please Note:  You might notice some irritation and congestion in your nose or some drainage.  This is from the oxygen used during your procedure.  There is no need for concern and it should clear up in a day or so.  SYMPTOMS TO REPORT IMMEDIATELY:  Following upper endoscopy (EGD)  Vomiting of blood or coffee ground material  New chest pain or pain under the shoulder blades  Painful or persistently difficult swallowing  New shortness of breath  Fever of 100F or higher  Black, tarry-looking stools  For urgent or emergent issues, a gastroenterologist can be reached at any hour by calling [phone]. Do not use MyChart messaging for urgent concerns.    DIET:  SEE POST DILATION DIET HANDOUT.  NOTHING BY MOUTH UNTIL 3:00 PM, THEN CLEAR LIQUIDS UNTIL 4 PM.  SOFT DIET FOR 24 HOURS.   Drink plenty of fluids but you should avoid alcoholic beverages for 24 hours.  ACTIVITY:  You should plan to take it easy for the rest of today and you should  NOT DRIVE or use heavy machinery until tomorrow (because of the sedation medicines used during the test).    FOLLOW UP: Our staff will call the number listed on your records the next business day following your procedure.  We will call around 7:15- 8:00 am to check on you and address any questions or concerns that you may have regarding the information given to you following your procedure. If we do not reach you, we will leave a message.      SIGNATURES/CONFIDENTIALITY: You and/or your care partner have signed paperwork which will be entered into your electronic medical record.  These signatures attest to the fact that that the information above on your After Visit Summary has been reviewed and is understood.  Full responsibility of the confidentiality of this discharge information lies with you and/or your care-partner.

## 2022-08-02 NOTE — Progress Notes (Signed)
HISTORY OF PRESENT ILLNESS:  Jessica [name] is a 72 y.o. female with history of peptic stricture for which she underwent esophageal dilation November 2022.  She contacted the office complaining of worsening dysphagia.  She was set up for direct noscapine with esophageal dilation  REVIEW OF SYSTEMS:  All non-GI ROS negative. Past Medical History:  Diagnosis Date   Anemia    Anxiety    CKD (chronic kidney disease)    CKD III per PCP notes-pt denies   Congenital blindness    Diabetes mellitus    Diverticula of small intestine    Esophageal stricture    GERD (gastroesophageal reflux disease)    H/O hiatal hernia    Hyperlipidemia    Hypertension    Hypothyroidism    Iron deficiency anemia    Osteopenia    Osteoporosis    Postoperative nausea and vomiting    Retinitis    STEMI (ST elevation myocardial infarction) ([place])    SVT (supraventricular tachycardia)    Vitamin B 12 deficiency     Past Surgical History:  Procedure Laterality Date   BACK SURGERY     CARPAL TUNNEL RELEASE     Right    CESAREAN SECTION     CHOLECYSTECTOMY  07/27/1983   COLONOSCOPY     CORONARY BALLOON ANGIOPLASTY N/A 12/30/2019   Procedure: CORONARY BALLOON ANGIOPLASTY;  Surgeon: Troy Sine, MD;  Location: [facility];  Service: Cardiovascular;  Laterality: N/A;   CORONARY/GRAFT ACUTE MI REVASCULARIZATION N/A 12/30/2019   Procedure: Coronary/Graft Acute MI Revascularization;  Surgeon: Troy Sine, MD;  Location: [facility];  Service: Cardiovascular;  Laterality: N/A;   HERNIA REPAIR     pt denies   LEFT HEART CATH AND CORONARY ANGIOGRAPHY N/A 12/30/2019   Procedure: LEFT HEART CATH AND CORONARY ANGIOGRAPHY;  Surgeon: Troy Sine, MD;  Location: [facility];  Service: Cardiovascular;  Laterality: N/A;   Right knee arthroscopy  07/27/1991   Right thyroidectomy  07/26/2005   TOTAL ABDOMINAL HYSTERECTOMY W/ BILATERAL SALPINGOOPHORECTOMY  07/27/1991   UPPER  GASTROINTESTINAL ENDOSCOPY      Social History Jessica [name]  reports that she has never smoked. She has never used smokeless tobacco. She reports that she does not drink alcohol and does not use drugs.  family history includes COPD in her father; CVA in her mother; Coronary artery disease in her father; Diabetes Mellitus II in her maternal grandmother and mother; Heart disease in her mother; Hyperlipidemia in her sister; Hypertension in her father, mother, sister, and sister; Uterine cancer in her sister.  Allergies  Allergen Reactions   Codeine Shortness Of Breath and Nausea And Vomiting   Erythromycin Nausea And Vomiting   Levaquin [Levofloxacin] Shortness Of Breath   Tramadol Nausea And Vomiting       PHYSICAL EXAMINATION: Vital signs: BP (!) 166/90   Pulse 86   Temp (!) 96.8 F (36 C)   Resp (!) 21   Ht 5' 1.25" (1.556 m)   Wt 114 lb (51.7 kg)   SpO2 100%   BMI 21.36 kg/m  General: Well-developed, well-nourished, no acute distress HEENT:  conjunctiva pink. Oral mucosa intact Lungs: Clear Heart: Regular Abdomen: soft, nontender, nondistended, no obvious ascites, no peritoneal signs, normal bowel sounds. No organomegaly. Extremities: No edema Psychiatric: alert and oriented x3. Cooperative     ASSESSMENT:  1.  GERD with history of peptic stricture previously dilated.  Now with recurrent dysphagia   PLAN: EGD  with dilation

## 2022-08-02 NOTE — Patient Instructions (Signed)
Medication Instructions:  Your physician recommends that you continue on your current medications as directed. Please refer to the Current Medication list given to you today.   *If you need a refill on your cardiac medications before your next appointment, please call your pharmacy*   Lab Work: NONE ordered at this time of appointment   If you have labs (blood work) drawn today and your tests are completely normal, you will receive your results only by: [place] (if you have MyChart) OR A paper copy in the mail If you have any lab test that is abnormal or we need to change your treatment, we will call you to review the results.   Testing/Procedures: NONE ordered at this time of appointment     Follow-Up: At [facility], you and your health needs are our priority.  As part of our continuing mission to provide you with exceptional heart care, we have created designated Provider Care Teams.  These Care Teams include your primary Cardiologist (physician) and Advanced Practice Providers (APPs -  Physician Assistants and Nurse Practitioners) who all work together to provide you with the care you need, when you need it.  We recommend signing up for the patient portal called "MyChart".  Sign up information is provided on this After Visit Summary.  MyChart is used to connect with patients for Virtual Visits (Telemedicine).  Patients are able to view lab/test results, encounter notes, upcoming appointments, etc.  Non-urgent messages can be sent to your provider as well.   To learn more about what you can do with MyChart, go to [email or web address].    Your next appointment:   2-3 month(s)  The format for your next appointment:   In Person  Provider:   Shelva Majestic, MD     Other Instructions   Important Information About Sugar

## 2022-08-02 NOTE — Progress Notes (Signed)
Report to PACU, RN, vss, BBS= Clear.  

## 2022-08-02 NOTE — Progress Notes (Signed)
Office Visit    Patient Name: Jessica [name] Date of Encounter: 08/02/2022  Primary Care Provider:  Ginger Organ., MD Primary Cardiologist:  Shelva Majestic, MD  Chief Complaint    72 year old female with a history of CAD s/p STEMI in 12/2019, s/p balloon angioplasty to dPDA, palpitations, hypertension, hyperlipidemia, type 2 diabetes, CKD stage III, hypothyroidism, and blindness since birth who presents for follow-up related to palpitations.   Past Medical History    Past Medical History:  Diagnosis Date   Anemia    Anxiety    CKD (chronic kidney disease)    CKD III per PCP notes-pt denies   Congenital blindness    Diabetes mellitus    Diverticula of small intestine    Esophageal stricture    GERD (gastroesophageal reflux disease)    H/O hiatal hernia    Hyperlipidemia    Hypertension    Hypothyroidism    Iron deficiency anemia    Osteopenia    Osteoporosis    Postoperative nausea and vomiting    Retinitis    STEMI (ST elevation myocardial infarction) ([place])    SVT (supraventricular tachycardia)    Vitamin B 12 deficiency    Past Surgical History:  Procedure Laterality Date   BACK SURGERY     CARPAL TUNNEL RELEASE     Right    CESAREAN SECTION     CHOLECYSTECTOMY  1985   COLONOSCOPY     CORONARY BALLOON ANGIOPLASTY N/A 12/30/2019   Procedure: CORONARY BALLOON ANGIOPLASTY;  Surgeon: Troy Sine, MD;  Location: [facility];  Service: Cardiovascular;  Laterality: N/A;   CORONARY/GRAFT ACUTE MI REVASCULARIZATION N/A 12/30/2019   Procedure: Coronary/Graft Acute MI Revascularization;  Surgeon: Troy Sine, MD;  Location: [facility];  Service: Cardiovascular;  Laterality: N/A;   HERNIA REPAIR     LEFT HEART CATH AND CORONARY ANGIOGRAPHY N/A 12/30/2019   Procedure: LEFT HEART CATH AND CORONARY ANGIOGRAPHY;  Surgeon: Troy Sine, MD;  Location: [facility];  Service: Cardiovascular;  Laterality: N/A;   Right knee arthroscopy  1993   Right  thyroidectomy  2007   TOTAL ABDOMINAL HYSTERECTOMY W/ BILATERAL SALPINGOOPHORECTOMY  1993   UPPER GASTROINTESTINAL ENDOSCOPY      Allergies  Allergies  Allergen Reactions   Codeine Shortness Of Breath and Nausea And Vomiting   Erythromycin Nausea And Vomiting   Levaquin [Levofloxacin] Shortness Of Breath   Tramadol Nausea And Vomiting    Labs/Other Studies Reviewed    The following studies were reviewed today:  30-Day event monitor 04/22/2022: Relatively normal 30-day event monitor from August 25 through April 17, 2022.  Patient was in sinus rhythm with a mean heart rate of 76 bpm.  The slowest heart rate was 61 bpm and the fastest heart rate was sinus tachycardia.  There were very rare isolated PACs and PVC.  No high-grade ectopy was demonstrated.  There were no episodes of atrial fibrillation or SVT.  There were no pauses.   Echocardiogram 12/2019: 1. Left ventricular ejection fraction, by estimation, is 50 to 55%. The  left ventricle has low normal function. The left ventricle demonstrates  regional wall motion abnormalities (see scoring diagram/findings for  description). There is mild left  ventricular hypertrophy. Left ventricular diastolic parameters are  consistent with Grade I diastolic dysfunction (impaired relaxation). There  is moderate hypokinesis of the left ventricular, entire inferior wall,  apical segment and inferoseptal wall.   2. Right ventricular systolic function is  normal. The right ventricular  size is normal.   3. The mitral valve is grossly normal. No evidence of mitral valve  regurgitation.   4. The aortic valve is tricuspid. Aortic valve regurgitation is not  visualized.   5. The inferior vena cava is normal in size with greater than 50%  respiratory variability, suggesting right atrial pressure of 3 mmHg.   LHC 12/2019: Prox LAD lesion is 30% stenosed. 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 Post intervention, there is a 5% residual stenosis.   Mild  nonobstructive stenosis of 30% in the proximal LAD in the region of the first diagonal and septal perforating artery.   Normal ramus intermediate and left circumflex coronary arteries.   Dominant RCA with total mid distal occlusion of the PDA vessel.   LVEDP 22 mmHg.   RECOMMENDATION: DAPT initially but since no stenting was performed duration may be less than 1 year.  Will DC bivalirudin several hours post procedure.  Hydrate.  Aggressive lipid-lowering therapy with target LDL less than 70 and optimal blood pressure control.  Recent Labs: 03/08/2022: Magnesium 2.2; TSH 4.248 03/09/2022: ALT 45; BUN 15; Creatinine, Ser 1.69; Hemoglobin 12.7; Platelets 268; Potassium 5.0; Sodium 136  Recent Lipid Panel    Component Value Date/Time   CHOL 116 12/31/2019 0325   TRIG 216 (H) 12/31/2019 0325   HDL 26 (L) 12/31/2019 0325   CHOLHDL 4.5 12/31/2019 0325   VLDL 43 (H) 12/31/2019 0325   LDLCALC 47 12/31/2019 0325    History of Present Illness    72 year old female with the above past medical history including CAD s/p STEMI in 12/2019, s/p balloon angioplasty to dPDA, palpitations, hypertension, hyperlipidemia, type 2 diabetes, CKD stage III, hypothyroidism, and blindness since birth.  She was hospitalized in 12/2019 in the setting of STEMI.  Emergent cardiac catheterization revealed 100% RPDA stenosis treated with balloon angioplasty, otherwise 30% pLAD stenosis which was managed medically.  Echocardiogram in 12/2019 showed EF 50 to 55%, mild LVH, moderate hypokinesis of inferior wall, apical, and inferoseptal wall, G1 DD, no significant valvular abnormalities.  She remained on aspirin and Brilinta, however, Brilinta was later discontinued following blunt trauma to her abdomen which save resulted in significant abdominal wall hematoma requiring blood transfusion.  She was last seen in the office on 10/05/2021 and was stable from a cardiac standpoint.  She presented to the ED on 03/08/2022 in the setting  of palpitations, generalized weakness, pain in both arms.  She noted accompanying elevated BP.  Troponin was mildly elevated with flat trend, cardiology was consulted.  She denied exertional symptoms concerning for angina.  Telemetry did not reveal any significant arrhythmia.  Outpatient cardiac monitor was recommended.  30 day event monitor from August to September 2023 revealed normal sinus rhythm, isolated PACs and PVCs, no atrial fibrillation or SVT, no significant pauses.  She presents today for follow-up accompanied by her husband. Since her last visit and since her ED visit she has been stable overall from a cardiac standpoint.  She does note ongoing intermittently elevated BP with occasional readings in the 140s-150s/90s.  When these episodes occur she feels cold and nervous.  She denies any chest pain, fever, palpitations, dizziness.  Her BP usually responds well to hydralazine.  However, last night she did have an episode where she took 2 doses of hydralazine and half a tablet of metoprolol before her BP finally came down.  She is scheduled for an EGD with dilation today per GI.  Additionally, she submitted her application  for patient assistance for [employer] approximately 3-4 weeks ago, however, she has not heard anything regarding approval status.  She is completely out of her Vania Rea and is requesting samples.  Other than her intermittently elevated BP, she reports feeling well.  Home Medications    Current Outpatient Medications  Medication Sig Dispense Refill   acetaminophen (TYLENOL) 325 MG tablet Take 2 tablets (650 mg total) by mouth every 4 (four) hours as needed for mild pain. (Patient taking differently: Take 325 mg by mouth as needed for mild pain.)     aspirin EC 81 MG tablet Take 1 tablet (81 mg total) by mouth daily. Swallow whole. 30 tablet 11   Coenzyme Q10 (CO Q 10) 100 MG CAPS Take 100 mg by mouth daily.      cyanocobalamin (,VITAMIN B-12,) 1000 MCG/ML injection Inject 1,000  mcg into the muscle every 30 (thirty) days.     Dulaglutide (TRULICITY) 1.5 ZO/1.0RU SOPN Inject 1.5 mg into the skin once a week.     empagliflozin (JARDIANCE) 10 MG TABS tablet Take 1 tablet (10 mg total) by mouth daily. 30 tablet 6   famotidine (PEPCID) 40 MG tablet TAKE 1 TABLET TWICE DAILY 180 tablet 3   levothyroxine (SYNTHROID) 50 MCG tablet Take 50 mcg by mouth daily before breakfast.     LORazepam (ATIVAN) 0.5 MG tablet Take 0.5 mg by mouth 2 (two) times daily as needed for anxiety.     magnesium oxide (MAG-OX) 400 MG tablet Take 400 mg by mouth daily.     metoprolol succinate (TOPROL-XL) 50 MG 24 hr tablet Take 50 mg by mouth daily.     nitroGLYCERIN (NITROSTAT) 0.4 MG SL tablet Place 1 tablet (0.4 mg total) under the tongue every 5 (five) minutes x 3 doses as needed for chest pain. 25 tablet 12   pantoprazole (PROTONIX) 40 MG tablet Take 40 mg by mouth daily.     rosuvastatin (CRESTOR) 40 MG tablet TAKE 1 TABLET AT BEDTIME (Patient taking differently: Take 40 mg by mouth daily.) 90 tablet 3   sertraline (ZOLOFT) 50 MG tablet Take 50 mg by mouth at bedtime.      hydrALAZINE (APRESOLINE) 10 MG tablet Take 1 tablet (10 mg total) by mouth 3 (three) times daily as needed. hydralazine 10 mg as needed sbp greater than 160. Please  keep a blood pressure diary. 90 tablet 3   No current facility-administered medications for this visit.     Review of Systems    She denies chest pain, palpitations, dyspnea, pnd, orthopnea, n, v, dizziness, syncope, edema, weight gain, or early satiety. All other systems reviewed and are otherwise negative except as noted above.   Physical Exam    VS:  BP 128/82   Pulse 67   Ht 5' (1.524 m)   Wt 111 lb (50.3 kg)   SpO2 98%   BMI 21.68 kg/m   GEN: Well nourished, well developed, in no acute distress. HEENT: normal. Neck: Supple, no JVD, carotid bruits, or masses. Cardiac: RRR, no murmurs, rubs, or gallops. No clubbing, cyanosis, edema.  Radials/DP/PT 2+  and equal bilaterally.  Respiratory:  Respirations regular and unlabored, clear to auscultation bilaterally. GI: Soft, nontender, nondistended, BS + x 4. MS: no deformity or atrophy. Skin: warm and dry, no rash. Neuro:  Strength and sensation are intact. Psych: Normal affect.  Accessory Clinical Findings    ECG personally reviewed by me today -NSR, 67 bpm- no acute changes.   Lab Results  Component Value  Date   WBC 7.9 03/09/2022   HGB 12.7 03/09/2022   HCT 38.9 03/09/2022   MCV 95.1 03/09/2022   PLT 268 03/09/2022   Lab Results  Component Value Date   CREATININE 1.69 (H) 03/09/2022   BUN 15 03/09/2022   NA 136 03/09/2022   K 5.0 03/09/2022   CL 106 03/09/2022   CO2 19 (L) 03/09/2022   Lab Results  Component Value Date   ALT 45 (H) 03/09/2022   AST 39 03/09/2022   ALKPHOS 39 03/09/2022   BILITOT 0.4 03/09/2022   Lab Results  Component Value Date   CHOL 116 12/31/2019   HDL 26 (L) 12/31/2019   LDLCALC 47 12/31/2019   TRIG 216 (H) 12/31/2019   CHOLHDL 4.5 12/31/2019    Lab Results  Component Value Date   HGBA1C 7.1 (H) 03/09/2022    Assessment & Plan    1. Palpitations: She has a history of intermittent palpitations. 30 day event monitor from August to September 2023 revealed normal sinus rhythm, isolated PACs and PVCs, no atrial fibrillation or SVT, no significant pauses.  Reports stable intermittent palpitations, no associated symptoms. Continue metoprolol.   2. CAD: S/p STEMI, PTCA-RPDA in 2021. Stable with no anginal symptoms. No indication for ischemic evaluation. Continue ASA, metoprolol, Crestor.   3. Hypertension: BP well controlled overall though she does note intermittently elevated BP, generally controlled with as needed hydralazine.  She did have an episode last night where she required 2 doses of hydralazine  and an additional half dose of her metoprolol to lower her BP.  Her BP is elevated she gets cold and feels nervous.  Discussed ongoing  monitoring of BP.  She states her BP cuff is 57 to 74 years old.  Recommended updating BP cuff.  I advised her to continue to monitor BP and notify us if she has increased frequency of elevated BP readings. For now, continue current antihypertensive regimen.   4. Hyperlipidemia: LDL was 41 in 07/2021. Monitored and managed per PCP. Continue ASA, Crestor.   5. Type 2 diabetes: A1c was 7.1 in 02/2022.  Patient assistance application for Vania Rea is pending.  She is completely out of her medication. Samples provided in office today.  Monitored and managed per PCP.   6. CKD stage III: Creatine was 1.79 in 03/2022. Stable overall.   7. Hypothyroidism: TSH was 4.284 in 02/2022. Monitored and managed per OCD.   8. Disposition: Follow-up in 2-3 months with Dr. Claiborne Billings.      Lenna Sciara, NP 08/02/2022, 9:45 AM

## 2022-08-02 NOTE — Progress Notes (Signed)
Vital signs checked by:DT  The medical and surgical history was reviewed and verified with the patient.  

## 2022-08-02 NOTE — Op Note (Signed)
[facility] Patient Name: Jessica [name] Procedure Date: 08/02/2022 1:20 PM MRN: [id number] Endoscopist: Docia Chuck. Henrene Pastor , MD, [id number] Age: 72 Referring MD:  Date of Birth: 1951-06-11 Gender: Female Account #: [id number] Procedure:                Upper GI endoscopy with balloon dilation of the                            esophagus?"18 to 20 mm Indications:              Dysphagia, Therapeutic procedure, GERD. Previously                            dilated November 2022 Medicines:                Monitored Anesthesia Care Procedure:                Pre-Anesthesia Assessment:                           - Prior to the procedure, a History and Physical                            was performed, and patient medications and                            allergies were reviewed. The patient's tolerance of                            previous anesthesia was also reviewed. The risks                            and benefits of the procedure and the sedation                            options and risks were discussed with the patient.                            All questions were answered, and informed consent                            was obtained. Prior Anticoagulants: The patient has                            taken no anticoagulant or antiplatelet agents. ASA                            Grade Assessment: II - A patient with mild systemic                            disease. After reviewing the risks and benefits,                            the patient was deemed in satisfactory condition to  undergo the procedure.                           After obtaining informed consent, the endoscope was                            passed under direct vision. Throughout the                            procedure, the patient's blood pressure, pulse, and                            oxygen saturations were monitored continuously. The                            Endoscope was introduced  through the mouth, and                            advanced to the second part of duodenum. The upper                            GI endoscopy was accomplished without difficulty.                            The patient tolerated the procedure well. Scope In: Scope Out: Findings:                 The scope passed through the cricopharyngeal region                            without resistance. The exam of the esophagus                            revealed tortuosity but was otherwise normal.                           One benign-appearing, intrinsic moderate stenosis                            was found 31 cm from the incisors. This stenosis                            measured 1.5 cm (inner diameter). After completing                            the endoscopic survey, A TTS dilator was passed                            through the scope. Dilation with an 18-19-20 mm                            balloon dilator was performed to 20 mm.                           The  stomach was normal, save sliding hiatal hernia.                           The examined duodenum was normal.                           The cardia and gastric fundus were normal on                            retroflexion. Complications:            No immediate complications. Estimated Blood Loss:     Estimated blood loss: none. Impression:               - Benign-appearing esophageal stenosis. Dilated.                           - Normal stomach. Hiatal hernia.                           - Normal examined duodenum.                           - No specimens collected. Recommendation:           - Patient has a contact number available for                            emergencies. The signs and symptoms of potential                            delayed complications were discussed with the                            patient. Return to normal activities tomorrow.                            Written discharge instructions were provided to the                             patient.                           - Post dilation diet.                           - Continue present medications.                           ?" If dysphagia continues, consider barium                            esophagram with tablet Docia Chuck. Henrene Pastor, MD 08/02/2022 1:49:47 PM This report has been signed electronically.

## 2022-08-02 NOTE — Progress Notes (Signed)
Pt and spouse are blind, reviewed instructions verbally and at length.  Discussed with driver as well.  Pt and spouse verb understanding and will call if any concerns, also instructed them if any concern and they cannot remember our number to please call 911.  Verb understanding.

## 2022-08-03 ENCOUNTER — Other Ambulatory Visit: Payer: Self-pay | Admitting: Cardiovascular Disease

## 2022-08-03 ENCOUNTER — Telehealth: Payer: Self-pay

## 2022-08-03 NOTE — Telephone Encounter (Signed)
  Follow up Call-     08/02/2022   12:49 PM 06/04/2021    8:28 AM  Call back number  Post procedure Call Back phone  # [phone]  Permission to leave phone message Yes Yes     Patient questions:  Do you have a fever, pain , or abdominal swelling? No. Pain Score  0 *  Have you tolerated food without any problems? Yes.    Have you been able to return to your normal activities? Yes.    Do you have any questions about your discharge instructions: Diet   No. Medications  No. Follow up visit  No.  Do you have questions or concerns about your Care? No.  Actions: * If pain score is 4 or above: No action needed, pain <4.

## 2022-08-20 ENCOUNTER — Telehealth: Payer: Self-pay | Admitting: Internal Medicine

## 2022-08-20 NOTE — Telephone Encounter (Signed)
Dr. Henrene Pastor does not do virtual visits - especially phone visits,  for which I don't think they are reimbursed.  It's possible an APP may do a virtual visit - but even then I think it needs to be through [facility] with their phone

## 2022-08-20 NOTE — Telephone Encounter (Signed)
Patient called, would like visit with Dr. Henrene Pastor to be over the phone. Patient states her and her husband are legally blind and are having issues with transportation. Please advise.

## 2022-08-24 ENCOUNTER — Ambulatory Visit: Payer: Medicare HMO | Admitting: Internal Medicine

## 2022-08-31 ENCOUNTER — Telehealth: Payer: Self-pay | Admitting: Cardiovascular Disease

## 2022-08-31 NOTE — Telephone Encounter (Signed)
I Let patient know that there was no wallet or ID found here at office and if anything turns up, we will call and let her know.

## 2022-08-31 NOTE — Telephone Encounter (Signed)
Patient is calling stating she believes she left her black wallet with her ID at the office last month when she came in for an appt. Attempted to call all available numbers for check-in & checkout to see if it had been turned in, but did not get an answer. Please advise.

## 2022-09-22 DIAGNOSIS — N184 Chronic kidney disease, stage 4 (severe): Secondary | ICD-10-CM | POA: Diagnosis not present

## 2022-09-22 DIAGNOSIS — E039 Hypothyroidism, unspecified: Secondary | ICD-10-CM | POA: Diagnosis not present

## 2022-09-22 DIAGNOSIS — Z0000 Encounter for general adult medical examination without abnormal findings: Secondary | ICD-10-CM | POA: Diagnosis not present

## 2022-09-22 DIAGNOSIS — E1129 Type 2 diabetes mellitus with other diabetic kidney complication: Secondary | ICD-10-CM | POA: Diagnosis not present

## 2022-09-22 DIAGNOSIS — M810 Age-related osteoporosis without current pathological fracture: Secondary | ICD-10-CM | POA: Diagnosis not present

## 2022-09-22 DIAGNOSIS — E538 Deficiency of other specified B group vitamins: Secondary | ICD-10-CM | POA: Diagnosis not present

## 2022-09-22 DIAGNOSIS — D649 Anemia, unspecified: Secondary | ICD-10-CM | POA: Diagnosis not present

## 2022-09-22 DIAGNOSIS — E785 Hyperlipidemia, unspecified: Secondary | ICD-10-CM | POA: Diagnosis not present

## 2022-09-27 DIAGNOSIS — I10 Essential (primary) hypertension: Secondary | ICD-10-CM | POA: Diagnosis not present

## 2022-09-27 DIAGNOSIS — R110 Nausea: Secondary | ICD-10-CM | POA: Diagnosis not present

## 2022-09-28 ENCOUNTER — Emergency Department (HOSPITAL_COMMUNITY): Payer: Medicare HMO

## 2022-09-28 ENCOUNTER — Encounter (HOSPITAL_COMMUNITY): Payer: Self-pay

## 2022-09-28 ENCOUNTER — Observation Stay (HOSPITAL_COMMUNITY): Payer: Medicare HMO

## 2022-09-28 ENCOUNTER — Observation Stay (HOSPITAL_COMMUNITY)
Admission: EM | Admit: 2022-09-28 | Discharge: 2022-09-29 | Disposition: A | Discharge status: Home / Self Care | Payer: Medicare HMO | Attending: Internal Medicine | Admitting: Internal Medicine

## 2022-09-28 ENCOUNTER — Other Ambulatory Visit: Payer: Self-pay

## 2022-09-28 DIAGNOSIS — Z7189 Other specified counseling: Secondary | ICD-10-CM

## 2022-09-28 DIAGNOSIS — R0981 Nasal congestion: Secondary | ICD-10-CM | POA: Insufficient documentation

## 2022-09-28 DIAGNOSIS — D72825 Bandemia: Secondary | ICD-10-CM | POA: Insufficient documentation

## 2022-09-28 DIAGNOSIS — Z7982 Long term (current) use of aspirin: Secondary | ICD-10-CM | POA: Insufficient documentation

## 2022-09-28 DIAGNOSIS — S0990XA Unspecified injury of head, initial encounter: Secondary | ICD-10-CM | POA: Diagnosis not present

## 2022-09-28 DIAGNOSIS — D72829 Elevated white blood cell count, unspecified: Secondary | ICD-10-CM | POA: Diagnosis not present

## 2022-09-28 DIAGNOSIS — E875 Hyperkalemia: Secondary | ICD-10-CM | POA: Insufficient documentation

## 2022-09-28 DIAGNOSIS — I129 Hypertensive chronic kidney disease with stage 1 through stage 4 chronic kidney disease, or unspecified chronic kidney disease: Secondary | ICD-10-CM | POA: Diagnosis not present

## 2022-09-28 DIAGNOSIS — Z1152 Encounter for screening for COVID-19: Secondary | ICD-10-CM | POA: Diagnosis not present

## 2022-09-28 DIAGNOSIS — I10 Essential (primary) hypertension: Secondary | ICD-10-CM | POA: Diagnosis present

## 2022-09-28 DIAGNOSIS — W01198A Fall on same level from slipping, tripping and stumbling with subsequent striking against other object, initial encounter: Secondary | ICD-10-CM | POA: Diagnosis not present

## 2022-09-28 DIAGNOSIS — E119 Type 2 diabetes mellitus without complications: Secondary | ICD-10-CM | POA: Insufficient documentation

## 2022-09-28 DIAGNOSIS — M791 Myalgia, unspecified site: Secondary | ICD-10-CM | POA: Diagnosis not present

## 2022-09-28 DIAGNOSIS — E039 Hypothyroidism, unspecified: Secondary | ICD-10-CM | POA: Diagnosis present

## 2022-09-28 DIAGNOSIS — T148XXA Other injury of unspecified body region, initial encounter: Secondary | ICD-10-CM

## 2022-09-28 DIAGNOSIS — Z043 Encounter for examination and observation following other accident: Secondary | ICD-10-CM | POA: Diagnosis not present

## 2022-09-28 DIAGNOSIS — R1032 Left lower quadrant pain: Secondary | ICD-10-CM | POA: Insufficient documentation

## 2022-09-28 DIAGNOSIS — S300XXA Contusion of lower back and pelvis, initial encounter: Secondary | ICD-10-CM | POA: Diagnosis present

## 2022-09-28 DIAGNOSIS — E871 Hypo-osmolality and hyponatremia: Secondary | ICD-10-CM | POA: Diagnosis not present

## 2022-09-28 DIAGNOSIS — E118 Type 2 diabetes mellitus with unspecified complications: Secondary | ICD-10-CM | POA: Diagnosis not present

## 2022-09-28 DIAGNOSIS — N184 Chronic kidney disease, stage 4 (severe): Secondary | ICD-10-CM | POA: Insufficient documentation

## 2022-09-28 DIAGNOSIS — Z79899 Other long term (current) drug therapy: Secondary | ICD-10-CM | POA: Insufficient documentation

## 2022-09-28 DIAGNOSIS — F419 Anxiety disorder, unspecified: Secondary | ICD-10-CM | POA: Diagnosis present

## 2022-09-28 DIAGNOSIS — W19XXXA Unspecified fall, initial encounter: Secondary | ICD-10-CM

## 2022-09-28 DIAGNOSIS — S199XXA Unspecified injury of neck, initial encounter: Secondary | ICD-10-CM | POA: Diagnosis not present

## 2022-09-28 DIAGNOSIS — H547 Unspecified visual loss: Secondary | ICD-10-CM | POA: Diagnosis not present

## 2022-09-28 DIAGNOSIS — Z66 Do not resuscitate: Secondary | ICD-10-CM | POA: Insufficient documentation

## 2022-09-28 DIAGNOSIS — M79602 Pain in left arm: Secondary | ICD-10-CM | POA: Diagnosis not present

## 2022-09-28 DIAGNOSIS — E785 Hyperlipidemia, unspecified: Secondary | ICD-10-CM | POA: Diagnosis not present

## 2022-09-28 DIAGNOSIS — D649 Anemia, unspecified: Secondary | ICD-10-CM | POA: Diagnosis present

## 2022-09-28 DIAGNOSIS — Y92009 Unspecified place in unspecified non-institutional (private) residence as the place of occurrence of the external cause: Secondary | ICD-10-CM | POA: Diagnosis not present

## 2022-09-28 DIAGNOSIS — K219 Gastro-esophageal reflux disease without esophagitis: Secondary | ICD-10-CM | POA: Diagnosis present

## 2022-09-28 DIAGNOSIS — R55 Syncope and collapse: Secondary | ICD-10-CM | POA: Diagnosis not present

## 2022-09-28 DIAGNOSIS — I2510 Atherosclerotic heart disease of native coronary artery without angina pectoris: Secondary | ICD-10-CM | POA: Diagnosis not present

## 2022-09-28 DIAGNOSIS — Z8679 Personal history of other diseases of the circulatory system: Secondary | ICD-10-CM

## 2022-09-28 LAB — COMPREHENSIVE METABOLIC PANEL
ALT: 33 U/L (ref 0–44)
AST: 34 U/L (ref 15–41)
Albumin: 3.8 g/dL (ref 3.5–5.0)
Alkaline Phosphatase: 28 U/L — ABNORMAL LOW (ref 38–126)
Anion gap: 7 (ref 5–15)
BUN: 17 mg/dL (ref 8–23)
CO2: 22 mmol/L (ref 22–32)
Calcium: 9.1 mg/dL (ref 8.9–10.3)
Chloride: 100 mmol/L (ref 98–111)
Creatinine, Ser: 1.98 mg/dL — ABNORMAL HIGH (ref 0.44–1.00)
GFR, Estimated: 26 mL/min — ABNORMAL LOW (ref >60)
Glucose, Bld: 200 mg/dL — ABNORMAL HIGH (ref 70–99)
Potassium: 5.3 mmol/L — ABNORMAL HIGH (ref 3.5–5.1)
Sodium: 129 mmol/L — ABNORMAL LOW (ref 135–145)
Total Bilirubin: 0.5 mg/dL (ref 0.3–1.2)
Total Protein: 6.5 g/dL (ref 6.5–8.1)

## 2022-09-28 LAB — TSH: TSH: 6.543 u[IU]/mL — ABNORMAL HIGH (ref 0.350–4.500)

## 2022-09-28 LAB — CBC WITH DIFFERENTIAL/PLATELET
Abs Immature Granulocytes: 0.22 10*3/uL — ABNORMAL HIGH (ref 0.00–0.07)
Basophils Absolute: 0.1 10*3/uL (ref 0.0–0.1)
Basophils Relative: 0 %
Eosinophils Absolute: 0.1 10*3/uL (ref 0.0–0.5)
Eosinophils Relative: 0 %
HCT: 34.9 % — ABNORMAL LOW (ref 36.0–46.0)
Hemoglobin: 11.2 g/dL — ABNORMAL LOW (ref 12.0–15.0)
Immature Granulocytes: 1 %
Lymphocytes Relative: 7 %
Lymphs Abs: 1.4 10*3/uL (ref 0.7–4.0)
MCH: 30.6 pg (ref 26.0–34.0)
MCHC: 32.1 g/dL (ref 30.0–36.0)
MCV: 95.4 fL (ref 80.0–100.0)
Monocytes Absolute: 1.3 10*3/uL — ABNORMAL HIGH (ref 0.1–1.0)
Monocytes Relative: 7 %
Neutro Abs: 16.6 10*3/uL — ABNORMAL HIGH (ref 1.7–7.7)
Neutrophils Relative %: 85 %
Platelets: 294 10*3/uL (ref 150–400)
RBC: 3.66 MIL/uL — ABNORMAL LOW (ref 3.87–5.11)
RDW: 13 % (ref 11.5–15.5)
WBC: 19.6 10*3/uL — ABNORMAL HIGH (ref 4.0–10.5)
nRBC: 0 % (ref 0.0–0.2)

## 2022-09-28 LAB — GLUCOSE, CAPILLARY
Glucose-Capillary: 186 mg/dL — ABNORMAL HIGH (ref 70–99)
Glucose-Capillary: 173 mg/dL — ABNORMAL HIGH (ref 70–99)

## 2022-09-28 LAB — RENAL FUNCTION PANEL
Albumin: 3.2 g/dL — ABNORMAL LOW (ref 3.5–5.0)
Anion gap: 6 (ref 5–15)
BUN: 17 mg/dL (ref 8–23)
CO2: 22 mmol/L (ref 22–32)
Calcium: 8.5 mg/dL — ABNORMAL LOW (ref 8.9–10.3)
Chloride: 107 mmol/L (ref 98–111)
Creatinine, Ser: 2.02 mg/dL — ABNORMAL HIGH (ref 0.44–1.00)
GFR, Estimated: 26 mL/min — ABNORMAL LOW (ref >60)
Glucose, Bld: 149 mg/dL — ABNORMAL HIGH (ref 70–99)
Phosphorus: 3.4 mg/dL (ref 2.5–4.6)
Potassium: 4.7 mmol/L (ref 3.5–5.1)
Sodium: 135 mmol/L (ref 135–145)

## 2022-09-28 LAB — PROTIME-INR
INR: 1 (ref 0.8–1.2)
Prothrombin Time: 12.8 seconds (ref 11.4–15.2)

## 2022-09-28 LAB — CBC
HCT: 26.4 % — ABNORMAL LOW (ref 36.0–46.0)
Hemoglobin: 9 g/dL — ABNORMAL LOW (ref 12.0–15.0)
MCH: 31.7 pg (ref 26.0–34.0)
MCHC: 34.1 g/dL (ref 30.0–36.0)
MCV: 93 fL (ref 80.0–100.0)
Platelets: 232 10*3/uL (ref 150–400)
RBC: 2.84 MIL/uL — ABNORMAL LOW (ref 3.87–5.11)
RDW: 13.2 % (ref 11.5–15.5)
WBC: 9.7 10*3/uL (ref 4.0–10.5)
nRBC: 0 % (ref 0.0–0.2)

## 2022-09-28 LAB — CBG MONITORING, ED: Glucose-Capillary: 201 mg/dL — ABNORMAL HIGH (ref 70–99)

## 2022-09-28 LAB — OCCULT BLOOD X 1 CARD TO LAB, STOOL: Fecal Occult Bld: NEGATIVE

## 2022-09-28 LAB — CK: Total CK: 87 U/L (ref 38–234)

## 2022-09-28 LAB — URINALYSIS, ROUTINE W REFLEX MICROSCOPIC
Bilirubin Urine: NEGATIVE
Glucose, UA: >=500 mg/dL — AB
Hgb urine dipstick: NEGATIVE
Ketones, ur: NEGATIVE mg/dL
Leukocytes,Ua: NEGATIVE
Nitrite: NEGATIVE
Protein, ur: NEGATIVE mg/dL
Specific Gravity, Urine: 1.018 (ref 1.005–1.030)
pH: 6 (ref 5.0–8.0)

## 2022-09-28 LAB — RESP PANEL BY RT-PCR (RSV, FLU A&B, COVID)  RVPGX2
Influenza A by PCR: NEGATIVE
Influenza B by PCR: NEGATIVE
Resp Syncytial Virus by PCR: NEGATIVE
SARS Coronavirus 2 by RT PCR: NEGATIVE

## 2022-09-28 MED ORDER — METOPROLOL SUCCINATE ER 25 MG PO TB24
50.0000 mg | ORAL_TABLET | Freq: Every day | ORAL | Status: DC
  Administered 2022-09-28 – 2022-09-29 (×2): 50 mg via ORAL
  Filled 2022-09-28 (×2): qty 2

## 2022-09-28 MED ORDER — ACETAMINOPHEN 325 MG PO TABS
650.0000 mg | ORAL_TABLET | Freq: Four times a day (QID) | ORAL | Status: DC | PRN
  Administered 2022-09-28: 650 mg via ORAL
  Filled 2022-09-28: qty 2

## 2022-09-28 MED ORDER — MORPHINE SULFATE (PF) 2 MG/ML IV SOLN
1.0000 mg | INTRAVENOUS | Status: DC | PRN
  Administered 2022-09-28 (×3): 1 mg via INTRAVENOUS
  Filled 2022-09-28 (×3): qty 1

## 2022-09-28 MED ORDER — ROSUVASTATIN CALCIUM 20 MG PO TABS
40.0000 mg | ORAL_TABLET | Freq: Every day | ORAL | Status: DC
  Administered 2022-09-28: 40 mg via ORAL
  Filled 2022-09-28: qty 2

## 2022-09-28 MED ORDER — PANTOPRAZOLE SODIUM 40 MG PO TBEC
40.0000 mg | DELAYED_RELEASE_TABLET | Freq: Two times a day (BID) | ORAL | Status: DC
  Administered 2022-09-28 – 2022-09-29 (×3): 40 mg via ORAL
  Filled 2022-09-28 (×3): qty 1

## 2022-09-28 MED ORDER — LEVOTHYROXINE SODIUM 50 MCG PO TABS
50.0000 ug | ORAL_TABLET | Freq: Every day | ORAL | Status: DC
  Administered 2022-09-29: 50 ug via ORAL
  Filled 2022-09-28: qty 1

## 2022-09-28 MED ORDER — SODIUM CHLORIDE 0.9 % IV SOLN: INTRAVENOUS | Status: DC

## 2022-09-28 MED ORDER — OXYCODONE HCL 5 MG PO TABS
5.0000 mg | ORAL_TABLET | Freq: Three times a day (TID) | ORAL | Status: DC | PRN
  Filled 2022-09-28: qty 1

## 2022-09-28 MED ORDER — INSULIN ASPART 100 UNIT/ML IJ SOLN
0.0000 [IU] | Freq: Three times a day (TID) | INTRAMUSCULAR | Status: DC
  Administered 2022-09-29: 1 [IU] via SUBCUTANEOUS
  Administered 2022-09-29 (×2): 2 [IU] via SUBCUTANEOUS

## 2022-09-28 MED ORDER — ACETAMINOPHEN 325 MG PO TABS
650.0000 mg | ORAL_TABLET | Freq: Four times a day (QID) | ORAL | Status: DC
  Administered 2022-09-28 – 2022-09-29 (×4): 650 mg via ORAL
  Filled 2022-09-28 (×3): qty 2

## 2022-09-28 MED ORDER — ONDANSETRON HCL 4 MG PO TABS
4.0000 mg | ORAL_TABLET | Freq: Four times a day (QID) | ORAL | Status: DC | PRN
  Administered 2022-09-28: 4 mg via ORAL

## 2022-09-28 MED ORDER — ONDANSETRON HCL 4 MG/2ML IJ SOLN
4.0000 mg | Freq: Four times a day (QID) | INTRAMUSCULAR | Status: DC | PRN
  Filled 2022-09-28: qty 2

## 2022-09-28 MED ORDER — SODIUM CHLORIDE 0.9% FLUSH
3.0000 mL | Freq: Two times a day (BID) | INTRAVENOUS | Status: DC
  Administered 2022-09-28 – 2022-09-29 (×2): 3 mL via INTRAVENOUS

## 2022-09-28 MED ORDER — LORAZEPAM 0.5 MG PO TABS: 0.5000 mg | ORAL_TABLET | Freq: Two times a day (BID) | ORAL | Status: DC | PRN

## 2022-09-28 MED ORDER — FAMOTIDINE 20 MG PO TABS
40.0000 mg | ORAL_TABLET | Freq: Two times a day (BID) | ORAL | Status: DC
  Administered 2022-09-28 – 2022-09-29 (×3): 40 mg via ORAL
  Filled 2022-09-28 (×3): qty 2

## 2022-09-28 MED ORDER — SODIUM CHLORIDE 0.9 % IV BOLUS
1000.0000 mL | Freq: Once | INTRAVENOUS | Status: AC
  Administered 2022-09-28: 1000 mL via INTRAVENOUS

## 2022-09-28 MED ORDER — FENTANYL CITRATE PF 50 MCG/ML IJ SOSY: 12.5000 ug | PREFILLED_SYRINGE | INTRAMUSCULAR | Status: DC | PRN

## 2022-09-28 MED ORDER — SERTRALINE HCL 50 MG PO TABS
50.0000 mg | ORAL_TABLET | Freq: Every day | ORAL | Status: DC
  Administered 2022-09-28: 50 mg via ORAL
  Filled 2022-09-28: qty 1

## 2022-09-28 MED ORDER — ACETAMINOPHEN 650 MG RE SUPP: 650.0000 mg | Freq: Four times a day (QID) | RECTAL | Status: DC | PRN

## 2022-09-28 MED ORDER — SODIUM CHLORIDE 0.9 % IV SOLN: INTRAVENOUS | Status: AC

## 2022-09-28 NOTE — ED Notes (Signed)
Admission team at bedside.

## 2022-09-28 NOTE — H&P (Signed)
History and Physical    Patient: Jessica [name] [id number] DOB: [birth year] DOA: 09/28/2022 DOS: the patient was seen and examined on 09/28/2022 PCP: Ginger Organ., MD  Patient coming from: Home-lives with husband  Chief Complaint:  Chief Complaint  Patient presents with   Fall   HPI: Jessica [name] is a 72 y.o. female with medical history significant of hypothyroidism, diabetes mellitus type 2, dyslipidemia, hypertension, GERD with esophageal stricture and associated dysphagia, retinitis pigmentosa, history of CAD status post STEMI, and CKD 4.  Patient reports in usual state of health although for the past week on her husband has had some sort of upper respiratory likely viral syndrome.  She stated she was not eating and drinking as much is baseline.  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  She did notice significant bruising but did not feel it was severe enough to present for medical evaluation.  She was taking Tylenol for the discomfort.  Because of the pain she was unable to get up into her bed but was able to sleep on the couch.  Sometime between 3/4 and 3/5 patient got up to go to the kitchen.  She remembers getting up but does not remember falling.  Apparently she had 2 episodes like this not involving a mechanical fall.  She was brought to [facility] ER by [facility] for further evaluation and treatment.  She was not hypoxemic.  In the ER she was afebrile and hemodynamically stable.  Multiple x-rays were obtained and did not reveal any bony trauma or intracranial injury after fall.  She was found to have a significant hematoma on her left buttock measuring 5.9 x 4 x 11 cm.  There was moderate surrounding stranding.  Her creatinine was slightly elevated above baseline at 1.98, glucose was elevated at 200, sodium was 129 and potassium was 5.3.  White  count was elevated at 19,600 with a left shift.  Urinalysis unremarkable.  No evidence of pneumonia on chest x-ray.  Viral PCR for flu, RSV and COVID-negative.  ED requested hospitalist service to evaluate the patient for admission regarding acute kidney injury, unexplained syncope, and difficulty ambulating secondary to large left buttock hematoma.   Review of Systems: As mentioned in the history of present illness. All other systems reviewed and are negative. Past Medical History:  Diagnosis Date   Anemia    Anxiety    CKD (chronic kidney disease)    CKD III per PCP notes-pt denies   Congenital blindness    Diabetes mellitus    Diverticula of small intestine    Esophageal stricture    GERD (gastroesophageal reflux disease)    H/O hiatal hernia    Hyperlipidemia    Hypertension    Hypothyroidism    Iron deficiency anemia    Osteopenia    Osteoporosis    Postoperative nausea and vomiting    Retinitis    STEMI (ST elevation myocardial infarction) (HCC)    SVT (supraventricular tachycardia)    Vitamin B 12 deficiency    Past Surgical History:  Procedure Laterality Date   BACK SURGERY     CARPAL TUNNEL RELEASE     Right    CESAREAN SECTION     CHOLECYSTECTOMY  07/27/1983   COLONOSCOPY     CORONARY BALLOON ANGIOPLASTY N/A 12/30/2019   Procedure: CORONARY BALLOON  ANGIOPLASTY;  Surgeon: Troy Sine, MD;  Location: [facility];  Service: Cardiovascular;  Laterality: N/A;   CORONARY/GRAFT ACUTE MI REVASCULARIZATION N/A 12/30/2019   Procedure: Coronary/Graft Acute MI Revascularization;  Surgeon: Troy Sine, MD;  Location: [facility];  Service: Cardiovascular;  Laterality: N/A;   HERNIA REPAIR     pt denies   LEFT HEART CATH AND CORONARY ANGIOGRAPHY N/A 12/30/2019   Procedure: LEFT HEART CATH AND CORONARY ANGIOGRAPHY;  Surgeon: Troy Sine, MD;  Location: [facility];  Service: Cardiovascular;  Laterality: N/A;   Right knee arthroscopy  07/27/1991    Right thyroidectomy  07/26/2005   TOTAL ABDOMINAL HYSTERECTOMY W/ BILATERAL SALPINGOOPHORECTOMY  07/27/1991   UPPER GASTROINTESTINAL ENDOSCOPY     Social History:  reports that she has never smoked. She has never used smokeless tobacco. She reports that she does not drink alcohol and does not use drugs.  Allergies  Allergen Reactions   Codeine Shortness Of Breath and Nausea And Vomiting   Erythromycin Nausea And Vomiting   Levaquin [Levofloxacin] Shortness Of Breath   Ultram [Tramadol] Nausea And Vomiting    Family History  Problem Relation Age of Onset   Coronary artery disease Father    COPD Father    CVA Mother    Diabetes Mellitus II Mother    Heart disease Mother    Diabetes Mellitus II Maternal Grandmother    Uterine cancer Sister    Hypertension Sister    Hypertension Sister        Brother also   Hyperlipidemia Sister        Brother also   Colon cancer Neg Hx    Rectal cancer Neg Hx    Stomach cancer Neg Hx    Esophageal cancer Neg Hx    Hypertension Mother    Hypertension Father     Prior to Admission medications   Medication Sig Start Date End Date Taking? Authorizing Provider  acetaminophen (TYLENOL) 325 MG tablet Take 2 tablets (650 mg total) by mouth every 4 (four) hours as needed for mild pain. Patient taking differently: Take 325 mg by mouth as needed for mild pain. 05/16/20   Saverio Danker, PA-C  aspirin EC 81 MG tablet Take 1 tablet (81 mg total) by mouth daily. Swallow whole. 06/27/20   Darreld Mclean, PA-C  Coenzyme Q10 (CO Q 10) 100 MG CAPS Take 100 mg by mouth daily.     [provider]  cyanocobalamin (,VITAMIN B-12,) 1000 MCG/ML injection Inject 1,000 mcg into the muscle every 30 (thirty) days. 11/02/19   [provider]  Dulaglutide (TRULICITY) 1.5 0000000 SOPN Inject 1.5 mg into the skin once a week. 03/20/21   [provider]  empagliflozin (JARDIANCE) 10 MG TABS tablet Take 1 tablet (10 mg total) by mouth daily.  03/25/21   Kroeger, Lorelee Cover., PA-C  famotidine (PEPCID) 40 MG tablet TAKE 1 TABLET TWICE DAILY 03/23/22   Levin Erp, PA  hydrALAZINE (APRESOLINE) 10 MG tablet Take 1 tablet (10 mg total) by mouth 3 (three) times daily as needed. hydralazine 10 mg as needed sbp greater than 160. Please  keep a blood pressure diary. 08/02/22   Lenna Sciara, NP  levothyroxine (SYNTHROID) 50 MCG tablet Take 50 mcg by mouth daily before breakfast.    [provider]  LORazepam (ATIVAN) 0.5 MG tablet Take 0.5 mg by mouth 2 (two) times daily as needed for anxiety.    [provider]  magnesium oxide (MAG-OX) 400 MG tablet Take 400 mg by mouth daily.    [provider]  metoprolol succinate (TOPROL-XL) 50 MG 24 hr tablet Take 50 mg by mouth daily.    [provider]  nitroGLYCERIN (NITROSTAT) 0.4 MG SL tablet Place 1 tablet (0.4 mg total) under the tongue every 5 (five) minutes x 3 doses as needed for chest pain. 01/01/20   Bhagat, Crista Luria, PA  pantoprazole (PROTONIX) 40 MG tablet Take 40 mg by mouth 2 (two) times daily. 03/22/20   [provider]  rosuvastatin (CRESTOR) 40 MG tablet Take 1 tablet (40 mg total) by mouth daily. 08/03/22   Troy Sine, MD  sertraline (ZOLOFT) 50 MG tablet Take 50 mg by mouth at bedtime.  03/06/20   [provider]    Physical Exam: Vitals:   09/28/22 0230 09/28/22 0430 09/28/22 0600 09/28/22 0900  BP: 133/86 96/73 112/71 102/66  Pulse: 77 73 76 73  Resp: '19 19 17 '$ (!) 22  Temp:  97.6 F (36.4 C)    TempSrc:  Oral    SpO2: 100% 98% 100% 100%  Weight:      Height:       Constitutional: NAD, calm, uncomfortable secondary to left buttock hematoma Eyes: PERRL, lids and conjunctivae normal ENMT: Mucous membranes are somewhat dry. Posterior pharynx clear of any exudate or lesions.Normal dentition.  Neck: normal, supple, no masses, no thyromegaly Respiratory: clear to auscultation bilaterally, no wheezing, no crackles.  Normal respiratory effort. No accessory muscle use.  Cardiovascular: Regular rate and rhythm, no murmurs / rubs / gallops. No extremity edema. 2+ pedal pulses. No carotid bruits.  Abdomen: no tenderness, no masses palpated. No hepatosplenomegaly. Bowel sounds positive.  Musculoskeletal: no clubbing / cyanosis. No joint deformity upper and lower extremities. Good ROM, no contractures. Normal muscle tone.  Skin: no rashes, lesions, ulcers. No induration Neurologic: CN 2-12 grossly intact.  Patient noted to have impaired vision secondary to progressive retinitis pigmentosa sensation intact, DTR normal. Strength 5/5 x all 4 extremities.  Psychiatric: Normal judgment and insight. Alert and oriented x 3. Normal mood.     Data Reviewed:  As per HPI  Assessment and Plan: Fall at home with mixed pattern both mechanical as well as likely syncope related Patient had multiple falls in the past 36 to 48 hours with initial fall mechanical in nature. Other falls not so clear and could be syncope.  Patient is dehydrated and also having significant pain so could have had vasovagal response while ambulating Does have a history of prior CVA so we will obtain MRI of the brain and echocardiogram Check orthostatic vital signs since dehydration could have precipitated PT and OT evaluation.  TOC consult for possible home health services upon discharge Telemetry monitoring in the event patient had an undetected arrhythmia contributing to syncopal episode  Large left gluteal hematoma/leukocytosis Patient with significant leukocytosis without evidence of infection so likely reaction to large hematoma Continue to follow labs.  Hemoglobin stable Utilize Tylenol and low-dose morphine IV for pain control Therapy evaluation as above   Mild AKI on top of CKD 4 Baseline creatinine 1.69 August 2023 Current creatinine 1.98 Gentle IV fluid hydration as above  Hypertension Continue metoprolol for now  Diabetes  mellitus 2 Dehydrated so we will hold Trulicity Mild AKI so hold Jardiance Follow CBGs and provide SSI Check hemoglobin A1c  HLD Continue Crestor  Progressive retinitis pigmentosa Accommodate patient for visual impairment  CAD with history of prior STEMI Continue  statin and beta-blocker No longer on baby aspirin  Hypothyroidism Continue Synthroid  GERD with history of esophageal stricture and associated dysphagia Continue Protonix and Pepcid per home     Advance Care Planning:   Code Status: Full Code   DVT prophylaxis: Lovenox  Consults: None  Family Communication: Patient only  Severity of Illness: The appropriate patient status for this patient is OBSERVATION. Observation status is judged to be reasonable and necessary in order to provide the required intensity of service to ensure the patient's safety. The patient's presenting symptoms, physical exam findings, and initial radiographic and laboratory data in the context of their medical condition is felt to place them at decreased risk for further clinical deterioration. Furthermore, it is anticipated that the patient will be medically stable for discharge from the hospital within 2 midnights of admission.   Author: Erin Hearing, NP 09/28/2022 9:49 AM  For on call review [email or web address].

## 2022-09-28 NOTE — ED Notes (Signed)
ED TO INPATIENT HANDOFF REPORT  ED Nurse Name and Phone #: Waynetta Sandy [phone]  S Name/Age/Gender Jessica [name] 72 y.o. female Room/Bed: [id number]  Code Status   Code Status: Full Code  Home/SNF/Other Home Patient oriented to: self, place, time, and situation Is this baseline? Yes   Triage Complete: Triage complete  Chief Complaint Syncope and collapse [R55]  Triage Note Pt arrived from home via [facility] fell today x 2. First fell on buttocks and second fall this evening hitting left side of head. Pt denies blood thinners a&o x 4. Does not recall either fall.    Allergies Allergies  Allergen Reactions   Codeine Shortness Of Breath and Nausea And Vomiting   Erythromycin Nausea And Vomiting   Levaquin [Levofloxacin] Shortness Of Breath   Asa [Aspirin] Other (See Comments)    Pt told to avoid aspirin due to her heart.   Motrin [Ibuprofen] Other (See Comments)    Pt told to avoid ibuprofen due to her heart.   Ultram [Tramadol] Nausea And Vomiting    Level of Care/Admitting Diagnosis ED Disposition     ED Disposition  Admit   Condition  --   Comment  Hospital Area: [facility] [[id number]]  Level of Care: Telemetry Medical [104]  May place patient in observation at [facility] or [facility] if equivalent level of care is available:: Yes  Covid Evaluation: Confirmed COVID Negative  Diagnosis: Syncope and collapse [780.2.ICD-9-CM]  Admitting Physician: Mercy Riding [id number]  Attending Physician: Mercy Riding [id number]          B Medical/Surgery History Past Medical History:  Diagnosis Date   Anemia    Anxiety    CKD (chronic kidney disease)    CKD III per PCP notes-pt denies   Congenital blindness    Diabetes mellitus    Diverticula of small intestine    Esophageal stricture    GERD (gastroesophageal reflux disease)    H/O hiatal hernia    Hyperlipidemia    Hypertension    Hypothyroidism    Iron deficiency anemia     Osteopenia    Osteoporosis    Postoperative nausea and vomiting    Retinitis    STEMI (ST elevation myocardial infarction) ([place])    SVT (supraventricular tachycardia)    Vitamin B 12 deficiency    Past Surgical History:  Procedure Laterality Date   BACK SURGERY     CARPAL TUNNEL RELEASE     Right    CESAREAN SECTION     CHOLECYSTECTOMY  07/27/1983   COLONOSCOPY     CORONARY BALLOON ANGIOPLASTY N/A 12/30/2019   Procedure: CORONARY BALLOON ANGIOPLASTY;  Surgeon: Troy Sine, MD;  Location: [facility];  Service: Cardiovascular;  Laterality: N/A;   CORONARY/GRAFT ACUTE MI REVASCULARIZATION N/A 12/30/2019   Procedure: Coronary/Graft Acute MI Revascularization;  Surgeon: Troy Sine, MD;  Location: [facility];  Service: Cardiovascular;  Laterality: N/A;   HERNIA REPAIR     pt denies   LEFT HEART CATH AND CORONARY ANGIOGRAPHY N/A 12/30/2019   Procedure: LEFT HEART CATH AND CORONARY ANGIOGRAPHY;  Surgeon: Troy Sine, MD;  Location: [facility];  Service: Cardiovascular;  Laterality: N/A;   Right knee arthroscopy  07/27/1991   Right thyroidectomy  07/26/2005   TOTAL ABDOMINAL HYSTERECTOMY W/ BILATERAL SALPINGOOPHORECTOMY  07/27/1991   UPPER GASTROINTESTINAL ENDOSCOPY       A IV Location/Drains/Wounds Patient Lines/Drains/Airways Status     Active Line/Drains/Airways  Name Placement date Placement time Site Days   Peripheral IV 09/28/22 20 G 2.5" Anterior;Left;Upper Arm 09/28/22  0535  Arm  less than 1            Intake/Output Last 24 hours No intake or output data in the 24 hours ending 09/28/22 1303  Labs/Imaging Results for orders placed or performed during the hospital encounter of 09/28/22 (from the past 48 hour(s))  POC CBG, ED     Status: Abnormal   Collection Time: 09/28/22  2:39 AM  Result Value Ref Range   Glucose-Capillary 201 (H) 70 - 99 mg/dL    Comment: Glucose reference range applies only to samples taken after fasting  for at least 8 hours.  CBC with Differential     Status: Abnormal   Collection Time: 09/28/22  3:05 AM  Result Value Ref Range   WBC 19.6 (H) 4.0 - 10.5 K/uL   RBC 3.66 (L) 3.87 - 5.11 MIL/uL   Hemoglobin 11.2 (L) 12.0 - 15.0 g/dL   HCT 34.9 (L) 36.0 - 46.0 %   MCV 95.4 80.0 - 100.0 fL   MCH 30.6 26.0 - 34.0 pg   MCHC 32.1 30.0 - 36.0 g/dL   RDW 13.0 11.5 - 15.5 %   Platelets 294 150 - 400 K/uL   nRBC 0.0 0.0 - 0.2 %   Neutrophils Relative % 85 %   Neutro Abs 16.6 (H) 1.7 - 7.7 K/uL   Lymphocytes Relative 7 %   Lymphs Abs 1.4 0.7 - 4.0 K/uL   Monocytes Relative 7 %   Monocytes Absolute 1.3 (H) 0.1 - 1.0 K/uL   Eosinophils Relative 0 %   Eosinophils Absolute 0.1 0.0 - 0.5 K/uL   Basophils Relative 0 %   Basophils Absolute 0.1 0.0 - 0.1 K/uL   Immature Granulocytes 1 %   Abs Immature Granulocytes 0.22 (H) 0.00 - 0.07 K/uL    Comment: Performed at [facility], [address] [place], [place] [address]  Comprehensive metabolic panel     Status: Abnormal   Collection Time: 09/28/22  3:05 AM  Result Value Ref Range   Sodium 129 (L) 135 - 145 mmol/L   Potassium 5.3 (H) 3.5 - 5.1 mmol/L   Chloride 100 98 - 111 mmol/L   CO2 22 22 - 32 mmol/L   Glucose, Bld 200 (H) 70 - 99 mg/dL    Comment: Glucose reference range applies only to samples taken after fasting for at least 8 hours.   BUN 17 8 - 23 mg/dL   Creatinine, Ser 1.98 (H) 0.44 - 1.00 mg/dL   Calcium 9.1 8.9 - 10.3 mg/dL   Total Protein 6.5 6.5 - 8.1 g/dL   Albumin 3.8 3.5 - 5.0 g/dL   AST 34 15 - 41 U/L   ALT 33 0 - 44 U/L   Alkaline Phosphatase 28 (L) 38 - 126 U/L   Total Bilirubin 0.5 0.3 - 1.2 mg/dL   GFR, Estimated 26 (L) >60 mL/min    Comment: (NOTE) Calculated using the CKD-EPI Creatinine Equation (2021)    Anion gap 7 5 - 15    Comment: Performed at [facility] [address] [place], [place] [address]  Protime-INR     Status: None   Collection Time: 09/28/22  3:05 AM  Result Value Ref Range    Prothrombin Time 12.8 11.4 - 15.2 seconds   INR 1.0 0.8 - 1.2    Comment: (NOTE) INR goal varies based  on device and disease states. Performed at [facility], [place] [address] [place], [place] [address]   Resp panel by RT-PCR (RSV, Flu A&B, Covid) Anterior Nasal Swab     Status: None   Collection Time: 09/28/22  3:05 AM   Specimen: Anterior Nasal Swab  Result Value Ref Range   SARS Coronavirus 2 by RT PCR NEGATIVE NEGATIVE   Influenza A by PCR NEGATIVE NEGATIVE   Influenza B by PCR NEGATIVE NEGATIVE    Comment: (NOTE) The Xpert Xpress SARS-CoV-2/FLU/RSV plus assay is intended as an aid in the diagnosis of influenza from Nasopharyngeal swab specimens and should not be used as a sole basis for treatment. Nasal washings and aspirates are unacceptable for Xpert Xpress SARS-CoV-2/FLU/RSV testing.  Fact Sheet for Patients: EntrepreneurPulse.com.au  Fact Sheet for Healthcare Providers: IncredibleEmployment.be  This test is not yet approved or cleared by the [place] FDA and has been authorized for detection and/or diagnosis of SARS-CoV-2 by FDA under an Emergency Use Authorization (EUA). This EUA will remain in effect (meaning this test can be used) for the duration of the COVID-19 declaration under Section 564(b)(1) of the Act, 21 U.S.C. section 360bbb-3(b)(1), unless the authorization is terminated or revoked.     Resp Syncytial Virus by PCR NEGATIVE NEGATIVE    Comment: (NOTE) Fact Sheet for Patients: EntrepreneurPulse.com.au  Fact Sheet for Healthcare Providers: IncredibleEmployment.be  This test is not yet approved or cleared by the [place] FDA and has been authorized for detection and/or diagnosis of SARS-CoV-2 by FDA under an Emergency Use Authorization (EUA). This EUA will remain in effect (meaning this test can be used) for the duration of the COVID-19 declaration under Section  564(b)(1) of the Act, 21 U.S.C. section 360bbb-3(b)(1), unless the authorization is terminated or revoked.  Performed at [facility], [place] [address] [place], [place] [address]   Urinalysis, Routine w reflex microscopic -Urine, Clean Catch     Status: Abnormal   Collection Time: 09/28/22  4:21 AM  Result Value Ref Range   Color, Urine YELLOW YELLOW   APPearance CLEAR CLEAR   Specific Gravity, Urine 1.018 1.005 - 1.030   pH 6.0 5.0 - 8.0   Glucose, UA >=500 (A) NEGATIVE mg/dL   Hgb urine dipstick NEGATIVE NEGATIVE   Bilirubin Urine NEGATIVE NEGATIVE   Ketones, ur NEGATIVE NEGATIVE mg/dL   Protein, ur NEGATIVE NEGATIVE mg/dL   Nitrite NEGATIVE NEGATIVE   Leukocytes,Ua NEGATIVE NEGATIVE   RBC / HPF 0-5 0 - 5 RBC/hpf   WBC, UA 0-5 0 - 5 WBC/hpf   Bacteria, UA RARE (A) NONE SEEN   Squamous Epithelial / HPF 0-5 0 - 5 /HPF    Comment: Performed at [facility], [place] [address] [place], [place] [address]  Occult blood card to lab, stool RN will collect     Status: None   Collection Time: 09/28/22  4:29 AM  Result Value Ref Range   Fecal Occult Bld NEGATIVE NEGATIVE    Comment: Performed at [facility], [address] [place], [place] [address]   MR BRAIN WO CONTRAST  Result Date: 09/28/2022 CLINICAL DATA:  Syncope/presyncope, cerebrovascular cause suspected. EXAM: MRI HEAD WITHOUT CONTRAST TECHNIQUE: Multiplanar, multiecho pulse sequences of the brain and surrounding structures were obtained without intravenous contrast. COMPARISON:  Head CT 09/28/2022.  MRI brain 03/02/2021. FINDINGS: Brain: No acute infarct or intracranial hemorrhage. Unchanged encephalomalacia along the right postcentral gyrus and left medial occipital lobes. Chronic appearing infarct  along the pars triangularis of the left inferior frontal gyrus is new from the prior study. Single focus of microhemorrhage along the right frontal operculum. No hydrocephalus or extra-axial collection. No mass or  midline shift. Vascular: Normal flow voids. Skull and upper cervical spine: Normal marrow signal. Sinuses/Orbits: Unremarkable. Other: Left parietal scalp contusion. IMPRESSION: 1. No acute intracranial abnormality or mass. 2. 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 Electronically Signed   By: Emmit Alexanders M.D.   On: 09/28/2022 11:24   CT ABDOMEN PELVIS WO CONTRAST  Result Date: 09/28/2022 CLINICAL DATA:  Fall injury with left lower quadrant pain and impact to the buttocks. EXAM: CT ABDOMEN AND PELVIS WITHOUT CONTRAST TECHNIQUE: Multidetector CT imaging of the abdomen and pelvis was performed following the standard protocol without IV contrast. RADIATION DOSE REDUCTION: This exam was performed according to the departmental dose-optimization program which includes automated exposure control, adjustment of the mA and/or kV according to patient size and/or use of iterative reconstruction technique. COMPARISON:  Chest CT without contrast 06/30/2021 and 08/01/2019, CT abdomen and pelvis with oral contrast only dated 04/18/2006. FINDINGS: Lower chest: Again noted is a 1.5 x 1.2 cm rounded lingular noncalcified nodule. This is unchanged dating back to the 2021 chest CT. In 2007 this measured 1 x 0.8 cm but a benign entity is still favored given 3 years stability since 2021. The lung bases are otherwise clear. There is a small hiatal hernia. The cardiac size is normal. Hepatobiliary: The liver is unremarkable without contrast. Gallbladder is absent with chronic postcholecystectomy common bile duct dilatation to 11 mm. Pancreas: Unremarkable without contrast. Spleen: Unremarkable without contrast. No splenic injury or perisplenic hematoma. Adrenals/Urinary Tract: No adrenal or renal hemorrhage or hematoma. Both kidneys are small in length, right measuring 7.4 cm length, left is 8 cm with bilateral mild generalized cortical volume loss. There is no contour deforming mass of either  unenhanced kidney. There is no urinary stone or obstruction. There is no bladder thickening. Stomach/Bowel: Small hiatal hernia. Otherwise unremarkable stomach. There is no small bowel dilatation or inflammation. The appendix is normal. There is diverticulosis along the colon without evidence of colitis or focal diverticulitis. Vascular/Lymphatic: Aortic atherosclerosis. No enlarged abdominal or pelvic lymph nodes. Reproductive: Status post hysterectomy. No adnexal masses. Other: Numerous pelvic phleboliths. There are bilateral inguinal fat hernias. There is no incarcerated hernia. There is free air, free hemorrhage or free ascites. Musculoskeletal: There is a subcutaneous hematoma in the lower left buttock measuring 5.9 x 4 x 11 cm. There is surrounding moderate stranding in the left buttock almost certainly posttraumatic although infectious process could appear similar. No gluteal intramuscular hematoma is seen. There is mild chronic compression fracture of T12 and L2, and L2 with again noted kyphoplasty cement. No regional acute skeletal fracture is seen. There is osteopenia and degenerative change of the lumbar spine. IMPRESSION: 1. 5.9 x 4 x 11 cm subcutaneous hematoma in the lower left buttock with moderate surrounding stranding. 2. No other acute trauma related findings are seen in the abdomen or pelvis without contrast. 3. 1.5 x 1.2 cm lingular nodule, unchanged dating back to the 2021 chest CT. In 2007 this measured 1 x 0.8 cm, but a benign entity is still favored given 3 years stability since 2021. 4. Small hiatal hernia. 5. Diverticulosis without evidence of diverticulitis. 6. Aortic atherosclerosis. 7. Bilateral inguinal fat hernias. 8. Osteopenia and degenerative change. Chronic compression fractures of T12, L2. Aortic Atherosclerosis (ICD10-I70.0). Electronically Signed  By: Telford Nab M.D.   On: 09/28/2022 07:26   CT Cervical Spine Wo Contrast  Result Date: 09/28/2022 CLINICAL DATA:  Neck  trauma (Age >= 65y).  Fall. EXAM: CT CERVICAL SPINE WITHOUT CONTRAST TECHNIQUE: Multidetector CT imaging of the cervical spine was performed without intravenous contrast. Multiplanar CT image reconstructions were also generated. RADIATION DOSE REDUCTION: This exam was performed according to the departmental dose-optimization program which includes automated exposure control, adjustment of the mA and/or kV according to patient size and/or use of iterative reconstruction technique. COMPARISON:  None Available. FINDINGS: Alignment: No subluxation Skull base and vertebrae: No acute fracture. No primary bone lesion or focal pathologic process. Soft tissues and spinal canal: No prevertebral fluid or swelling. No visible canal hematoma. Disc levels: Moderate diffuse degenerative disc disease. Advanced bilateral degenerative facet disease, left greater than right. Upper chest: No acute findings Other: None IMPRESSION: Degenerative disc and facet disease. No acute bony abnormality. Electronically Signed   By: Rolm Baptise M.D.   On: 09/28/2022 03:41   CT Head Wo Contrast  Result Date: 09/28/2022 CLINICAL DATA:  Fall EXAM: CT HEAD WITHOUT CONTRAST TECHNIQUE: Contiguous axial images were obtained from the base of the skull through the vertex without intravenous contrast. RADIATION DOSE REDUCTION: This exam was performed according to the departmental dose-optimization program which includes automated exposure control, adjustment of the mA and/or kV according to patient size and/or use of iterative reconstruction technique. COMPARISON:  03/01/2021 FINDINGS: Brain: Prior left occipital infarct again noted, prior infarcts in the left occipital lobe and posterior right parietal lobe, stable. There is atrophy and chronic small vessel disease changes. No acute intracranial abnormality. Specifically, no hemorrhage, hydrocephalus, mass lesion, acute infarction, or significant intracranial injury. Vascular: No hyperdense vessel or  unexpected calcification. Skull: No acute calvarial abnormality. 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 Atrophy, chronic microvascular disease. No acute intracranial abnormality. Electronically Signed   By: Rolm Baptise M.D.   On: 09/28/2022 03:39   DG Humerus Left  Result Date: 09/28/2022 CLINICAL DATA:  Recent fall with left arm pain, initial encounter EXAM: LEFT HUMERUS - 2+ VIEW COMPARISON:  None Available. FINDINGS: There is no evidence of fracture or other focal bone lesions. Soft tissues are unremarkable. IMPRESSION: No acute abnormality noted. Electronically Signed   By: Inez Catalina M.D.   On: 09/28/2022 02:59   DG Chest Portable 1 View  Result Date: 09/28/2022 CLINICAL DATA:  Fall EXAM: PORTABLE CHEST 1 VIEW COMPARISON:  03/08/2022 FINDINGS: The heart size and mediastinal contours are within normal limits. Both lungs are clear. The visualized skeletal structures are unremarkable. IMPRESSION: No active disease. Electronically Signed   By: Rolm Baptise M.D.   On: 09/28/2022 02:49   DG Hip Unilat With Pelvis 2-3 Views Left  Result Date: 09/28/2022 CLINICAL DATA:  Fall EXAM: DG HIP (WITH OR WITHOUT PELVIS) 2-3V LEFT COMPARISON:  Right hip series and pelvis today. FINDINGS: No acute bony abnormality. Specifically, no fracture, subluxation, or dislocation. Vascular calcifications and phleboliths in the pelvis. IMPRESSION: No acute bony abnormality. Electronically Signed   By: Rolm Baptise M.D.   On: 09/28/2022 02:33   DG Hip Unilat  With Pelvis 2-3 Views Right  Result Date: 09/28/2022 CLINICAL DATA:  Fall EXAM: DG HIP (WITH OR WITHOUT PELVIS) 2-3V RIGHT COMPARISON:  None Available. FINDINGS: There is no evidence of hip fracture or dislocation. There is no evidence of arthropathy or other focal bone abnormality. Vascular calcifications noted  and multiple phleboliths in the pelvis. IMPRESSION: No acute bony abnormality. Electronically  Signed   By: Rolm Baptise M.D.   On: 09/28/2022 02:33    Pending Labs Unresulted Labs (From admission, onward)     Start     Ordered   09/29/22 [id number]  Basic metabolic panel  Tomorrow morning,   R        09/28/22 0949   09/29/22 0500  CBC  Tomorrow morning,   R        09/28/22 0949   09/28/22 1303  CK  Add-on,   AD        09/28/22 1302   09/28/22 1051  TSH  Once,   R        09/28/22 1050            Vitals/Pain Today's Vitals   09/28/22 1030 09/28/22 1137 09/28/22 1145 09/28/22 1200  BP:    135/88  Pulse: 81  76 76  Resp: '19  16 19  '$ Temp:      TempSrc:      SpO2: 100%  100% 100%  Weight:      Height:      PainSc:  8       Isolation Precautions No active isolations  Medications Medications  0.9 %  sodium chloride infusion ( Intravenous New Bag/Given 09/28/22 1019)  morphine (PF) 2 MG/ML injection 1 mg (1 mg Intravenous Given 09/28/22 1035)  sodium chloride flush (NS) 0.9 % injection 3 mL (3 mLs Intravenous Not Given 09/28/22 1020)  acetaminophen (TYLENOL) tablet 650 mg (650 mg Oral Given 09/28/22 1207)    Or  acetaminophen (TYLENOL) suppository 650 mg ( Rectal See Alternative 09/28/22 1207)  ondansetron (ZOFRAN) tablet 4 mg (4 mg Oral Given 09/28/22 1035)    Or  ondansetron (ZOFRAN) injection 4 mg ( Intravenous See Alternative 09/28/22 1035)  metoprolol succinate (TOPROL-XL) 24 hr tablet 50 mg (50 mg Oral Given 09/28/22 1208)  rosuvastatin (CRESTOR) tablet 40 mg (has no administration in time range)  levothyroxine (SYNTHROID) tablet 50 mcg (has no administration in time range)  LORazepam (ATIVAN) tablet 0.5 mg (has no administration in time range)  sertraline (ZOLOFT) tablet 50 mg (has no administration in time range)  famotidine (PEPCID) tablet 40 mg (40 mg Oral Given 09/28/22 1208)  pantoprazole (PROTONIX) EC tablet 40 mg (40 mg Oral Given 09/28/22 1208)  sodium chloride 0.9 % bolus 1,000 mL (1,000 mLs Intravenous New Bag/Given 09/28/22 0541)    Mobility walks     Focused  Assessments Pt having mutiple falls here for acute AKI   R Recommendations: See Admitting Provider Note  Report given to:   Additional Notes: .

## 2022-09-28 NOTE — ED Notes (Signed)
Pt to MRI

## 2022-09-28 NOTE — ED Provider Notes (Signed)
Sign out taken from [facility] sponseller Fall, then syncope. Hematoma Left buttock  + WBC inc, recent + nausea and LLQ abd pain.  Expect admission  AKI   Margarita Mail, PA-C 09/28/22 1654    Pattricia Boss, MD 09/29/22 1444

## 2022-09-28 NOTE — Plan of Care (Signed)

## 2022-09-28 NOTE — ED Provider Notes (Cosign Needed Addendum)
[facility] Provider Note   CSN: [id number] Arrival date & time: 09/28/22  0049     History  Chief Complaint  Patient presents with   [name]    Jessica [name] is a 72 y.o. female who presents with concern for 2 falls today.  The first fall sounds mechanical in nature.  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  Bruising extensively over the left buttock and soreness there.  States that she was too uncomfortable in her bed and subsequently was sitting in the recliner chair.  Got up this evening to go to the bathroom to take some Tylenol and states that she woke up with her husband helping her off the floor next to the toilet.  Question possible vagal episode.  Did turn her head on that fall with laceration to the back of the scalp prompting ED visit.  States that her and her husband have both been "fighting a cold" for the last week with sniffles, decreased appetite and p.o. intake, and some nausea.  No diarrhea, no urinary symptoms.  Personally reviewed his medical records.  She has history of type 2 diabetes, hypothyroidism, hyperlipidemia, CAD, CKD, GERD.  Also with history of SVT.  She is not anticoagulated.  HPI     Home Medications Prior to Admission medications   Medication Sig Start Date End Date Taking? Authorizing Provider  acetaminophen (TYLENOL) 325 MG tablet Take 2 tablets (650 mg total) by mouth every 4 (four) hours as needed for mild pain. Patient taking differently: Take 325 mg by mouth as needed for mild pain. 05/16/20   Saverio Danker, PA-C  aspirin EC 81 MG tablet Take 1 tablet (81 mg total) by mouth daily. Swallow whole. 06/27/20   Darreld Mclean, PA-C  Coenzyme Q10 (CO Q 10) 100 MG CAPS Take 100 mg by mouth daily.     [provider]  cyanocobalamin (,VITAMIN B-12,) 1000  MCG/ML injection Inject 1,000 mcg into the muscle every 30 (thirty) days. 11/02/19   [provider]  Dulaglutide (TRULICITY) 1.5 0000000 SOPN Inject 1.5 mg into the skin once a week. 03/20/21   [provider]  empagliflozin (JARDIANCE) 10 MG TABS tablet Take 1 tablet (10 mg total) by mouth daily. 03/25/21   Kroeger, Lorelee Cover., PA-C  famotidine (PEPCID) 40 MG tablet TAKE 1 TABLET TWICE DAILY 03/23/22   Levin Erp, PA  hydrALAZINE (APRESOLINE) 10 MG tablet Take 1 tablet (10 mg total) by mouth 3 (three) times daily as needed. hydralazine 10 mg as needed sbp greater than 160. Please  keep a blood pressure diary. 08/02/22   Lenna Sciara, NP  levothyroxine (SYNTHROID) 50 MCG tablet Take 50 mcg by mouth daily before breakfast.    [provider]  LORazepam (ATIVAN) 0.5 MG tablet Take 0.5 mg by mouth 2 (two) times daily as needed for anxiety.    [provider]  magnesium oxide (MAG-OX) 400 MG tablet Take 400 mg by mouth daily.    [provider]  metoprolol succinate (TOPROL-XL) 50 MG 24 hr tablet Take 50 mg by mouth daily.    [provider]  nitroGLYCERIN (NITROSTAT) 0.4 MG SL tablet Place 1 tablet (0.4 mg total) under the tongue every 5 (five) minutes x 3 doses as needed for chest pain. 01/01/20   Leanor Kail,  PA  pantoprazole (PROTONIX) 40 MG tablet Take 40 mg by mouth daily. 03/22/20   [provider]  rosuvastatin (CRESTOR) 40 MG tablet Take 1 tablet (40 mg total) by mouth daily. 08/03/22   Troy Sine, MD  sertraline (ZOLOFT) 50 MG tablet Take 50 mg by mouth at bedtime.  03/06/20   [provider]      Allergies    Codeine, Erythromycin, Levaquin [levofloxacin], and Tramadol    Review of Systems   Review of Systems  Constitutional:  Negative for activity change, appetite change, chills, diaphoresis, fatigue and fever.  HENT:  Positive for congestion.   Respiratory: Negative.    Cardiovascular: Negative.    Genitourinary: Negative.   Musculoskeletal:  Positive for myalgias.    Physical Exam Updated Vital Signs BP 112/71   Pulse 76   Temp 97.6 F (36.4 C) (Oral)   Resp 17   Ht '5\' 1"'$  (1.549 m)   Wt 50.3 kg   SpO2 100%   BMI 20.97 kg/m  Physical Exam Vitals and nursing note reviewed.  Constitutional:      Appearance: She is not toxic-appearing.  HENT:     Head: Normocephalic and atraumatic.      Mouth/Throat:     Mouth: Mucous membranes are moist.     Pharynx: No oropharyngeal exudate or posterior oropharyngeal erythema.  Eyes:     General: Lids are normal.        Right eye: No discharge.        Left eye: No discharge.     Conjunctiva/sclera: Conjunctivae normal.     Comments: Pupils equal but nonreactive, per patient at baseline with impaired vision. Unable to assess EOMs due to baseline visual impairment.   Neck:     Trachea: Trachea and phonation normal.  Cardiovascular:     Rate and Rhythm: Normal rate and regular rhythm.     Pulses: Normal pulses.     Heart sounds: Normal heart sounds. No murmur heard. Pulmonary:     Effort: Pulmonary effort is normal. No respiratory distress.     Breath sounds: Normal breath sounds. No wheezing or rales.  Abdominal:     General: Bowel sounds are normal. There is no distension.     Palpations: Abdomen is soft.     Tenderness: There is abdominal tenderness in the suprapubic area and left lower quadrant. There is no right CVA tenderness, left CVA tenderness or guarding.  Musculoskeletal:        General: No deformity.     Right shoulder: Normal.     Left shoulder: Normal.     Right upper arm: Normal.     Left upper arm: Tenderness present. No swelling, edema, deformity, lacerations or bony tenderness.     Right elbow: Normal.     Left elbow: Normal.     Right forearm: Normal.     Left forearm: Normal.     Right wrist: Normal.     Left wrist: Normal.     Right hand: Normal.     Left hand: Normal.     Cervical back: Normal,  normal range of motion and neck supple. No bony tenderness.     Thoracic back: No bony tenderness.     Lumbar back: No bony tenderness.     Right hip: Tenderness present. No deformity or bony tenderness. Normal range of motion.     Left hip: Tenderness present. No bony tenderness. Normal range of motion.     Right upper leg:  Normal.     Left upper leg: Normal.     Right knee: Normal.     Left knee: Normal.     Right lower leg: Normal. No edema.     Left lower leg: Normal. No edema.     Right ankle: Normal.     Left ankle: Normal.     Right foot: Normal.     Left foot: Normal.       Legs:     Comments: Able to fully range bilateral hips and knees without pain.   Lymphadenopathy:     Cervical: No cervical adenopathy.  Skin:    General: Skin is warm and dry.     Capillary Refill: Capillary refill takes less than 2 seconds.       Neurological:     General: No focal deficit present.     Mental Status: She is alert and oriented to person, place, and time. Mental status is at baseline.     GCS: GCS eye subscore is 4. GCS verbal subscore is 5. GCS motor subscore is 6.     Cranial Nerves: No dysarthria or facial asymmetry.     Sensory: Sensation is intact.     Motor: Motor function is intact.     Coordination: Coordination is intact.     Comments: Gait deferred.   Psychiatric:        Mood and Affect: Mood normal.     ED Results / Procedures / Treatments   Labs (all labs ordered are listed, but only abnormal results are displayed) Labs Reviewed  CBC WITH DIFFERENTIAL/PLATELET - Abnormal; Notable for the following components:      Result Value   WBC 19.6 (*)    RBC 3.66 (*)    Hemoglobin 11.2 (*)    HCT 34.9 (*)    Neutro Abs 16.6 (*)    Monocytes Absolute 1.3 (*)    Abs Immature Granulocytes 0.22 (*)    All other components within normal limits  URINALYSIS, ROUTINE W REFLEX MICROSCOPIC - Abnormal; Notable for the following components:   Glucose, UA >=500 (*)    Bacteria,  UA RARE (*)    All other components within normal limits  COMPREHENSIVE METABOLIC PANEL - Abnormal; Notable for the following components:   Sodium 129 (*)    Potassium 5.3 (*)    Glucose, Bld 200 (*)    Creatinine, Ser 1.98 (*)    Alkaline Phosphatase 28 (*)    GFR, Estimated 26 (*)    All other components within normal limits  CBG MONITORING, ED - Abnormal; Notable for the following components:   Glucose-Capillary 201 (*)    All other components within normal limits  RESP PANEL BY RT-PCR (RSV, FLU A&B, COVID)  RVPGX2  PROTIME-INR  OCCULT BLOOD X 1 CARD TO LAB, STOOL  POC OCCULT BLOOD, ED    EKG EKG Interpretation  Date/Time:  Tuesday September 28 2022 02:56:48 EST Ventricular Rate:  73 PR Interval:  162 QRS Duration: 103 QT Interval:  426 QTC Calculation: 470 R Axis:   49 Text Interpretation: Sinus rhythm Borderline low voltage, extremity leads Interpretation limited secondary to artifact Confirmed by Ripley Fraise [phone]) on 09/28/2022 3:11:42 AM  Radiology CT Cervical Spine Wo Contrast  Result Date: 09/28/2022 CLINICAL DATA:  Neck trauma (Age >= 65y).  Fall. EXAM: CT CERVICAL SPINE WITHOUT CONTRAST TECHNIQUE: Multidetector CT imaging of the cervical spine was performed without intravenous contrast. Multiplanar CT image reconstructions were also generated. RADIATION DOSE REDUCTION: This  exam was performed according to the departmental dose-optimization program which includes automated exposure control, adjustment of the mA and/or kV according to patient size and/or use of iterative reconstruction technique. COMPARISON:  None Available. FINDINGS: Alignment: No subluxation Skull base and vertebrae: No acute fracture. No primary bone lesion or focal pathologic process. Soft tissues and spinal canal: No prevertebral fluid or swelling. No visible canal hematoma. Disc levels: Moderate diffuse degenerative disc disease. Advanced bilateral degenerative facet disease, left greater than right.  Upper chest: No acute findings Other: None IMPRESSION: Degenerative disc and facet disease. No acute bony abnormality. Electronically Signed   By: Rolm Baptise M.D.   On: 09/28/2022 03:41   CT Head Wo Contrast  Result Date: 09/28/2022 CLINICAL DATA:  Fall EXAM: CT HEAD WITHOUT CONTRAST TECHNIQUE: Contiguous axial images were obtained from the base of the skull through the vertex without intravenous contrast. RADIATION DOSE REDUCTION: This exam was performed according to the departmental dose-optimization program which includes automated exposure control, adjustment of the mA and/or kV according to patient size and/or use of iterative reconstruction technique. COMPARISON:  03/01/2021 FINDINGS: Brain: Prior left occipital infarct again noted, prior infarcts in the left occipital lobe and posterior right parietal lobe, stable. There is atrophy and chronic small vessel disease changes. No acute intracranial abnormality. Specifically, no hemorrhage, hydrocephalus, mass lesion, acute infarction, or significant intracranial injury. Vascular: No hyperdense vessel or unexpected calcification. Skull: No acute calvarial abnormality. 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 Atrophy, chronic microvascular disease. No acute intracranial abnormality. Electronically Signed   By: Rolm Baptise M.D.   On: 09/28/2022 03:39   DG Humerus Left  Result Date: 09/28/2022 CLINICAL DATA:  Recent fall with left arm pain, initial encounter EXAM: LEFT HUMERUS - 2+ VIEW COMPARISON:  None Available. FINDINGS: There is no evidence of fracture or other focal bone lesions. Soft tissues are unremarkable. IMPRESSION: No acute abnormality noted. Electronically Signed   By: Inez Catalina M.D.   On: 09/28/2022 02:59   DG Chest Portable 1 View  Result Date: 09/28/2022 CLINICAL DATA:  Fall EXAM: PORTABLE CHEST 1 VIEW COMPARISON:  03/08/2022 FINDINGS: The heart size and mediastinal  contours are within normal limits. Both lungs are clear. The visualized skeletal structures are unremarkable. IMPRESSION: No active disease. Electronically Signed   By: Rolm Baptise M.D.   On: 09/28/2022 02:49   DG Hip Unilat With Pelvis 2-3 Views Left  Result Date: 09/28/2022 CLINICAL DATA:  Fall EXAM: DG HIP (WITH OR WITHOUT PELVIS) 2-3V LEFT COMPARISON:  Right hip series and pelvis today. FINDINGS: No acute bony abnormality. Specifically, no fracture, subluxation, or dislocation. Vascular calcifications and phleboliths in the pelvis. IMPRESSION: No acute bony abnormality. Electronically Signed   By: Rolm Baptise M.D.   On: 09/28/2022 02:33   DG Hip Unilat  With Pelvis 2-3 Views Right  Result Date: 09/28/2022 CLINICAL DATA:  Fall EXAM: DG HIP (WITH OR WITHOUT PELVIS) 2-3V RIGHT COMPARISON:  None Available. FINDINGS: There is no evidence of hip fracture or dislocation. There is no evidence of arthropathy or other focal bone abnormality. Vascular calcifications noted and multiple phleboliths in the pelvis. IMPRESSION: No acute bony abnormality. Electronically Signed   By: Rolm Baptise M.D.   On: 09/28/2022 02:33    Procedures Procedures    Medications Ordered in ED Medications  sodium chloride 0.9 % bolus 1,000 mL (1,000 mLs Intravenous New Bag/Given 09/28/22 0541)    ED Course/ Medical Decision Making/  A&P                             Medical Decision Making 72 year old female who presents with concern for syncope and falls.   HTN on intake, cardiopulmonary exam is normal, abdominal exam with LLQ TTP, FROM of the hips bilaterally. GCS of 15, no acute focal deficit on exam.   Amount and/or Complexity of Data Reviewed Labs: ordered.    Details: CBC with leukocytosis of 19.6, mild anemia with hemoglobin 11.2 CMP with hyponatremia of 129, hyperkalemia 5.3, AKI with creatinine 1.9 increased from baseline of 1.6.  INR is 1, UA unremarkable, Hemoccult is negative, RVP is negative.  Radiology:  ordered.    Details:   Images visualized with provider, plain films of bilateral hips and pelvis unremarkable, plain film of the chest unremarkable and plain film of the left humerus unremarkable, CT head and C-spine are negative for acute traumatic injuries.   This patient signed out to oncoming ED provider Margarita Mail, PA-C at time of shift change.  All pertinent HPI physical exam laboratory findings were discussed with her prior to my departure.  Patient pending completion of CT abdomen pelvis at time of shift change.  Patient will likely require admission to the hospital given AKI and leukocytosis with recurrent falls today in context of elderly patient with multiple comorbidities.  Lynise  voiced understanding of her medical evaluation and treatment plan. Each of their questions answered to their expressed satisfaction. This chart was dictated using voice recognition software, Dragon. Despite the best efforts of this provider to proofread and correct errors, errors may still occur which can change documentation meaning.   Final Clinical Impression(s) / ED Diagnoses Final diagnoses:  None    Rx / DC Orders ED Discharge Orders     None         Emeline Darling, PA-C 09/28/22 0707    Carsen Machi, Gypsy Balsam, PA-C 09/28/22 [id number]    Ripley Fraise, MD 09/29/22 [phone]

## 2022-09-28 NOTE — ED Notes (Addendum)
Call pts husband when ready for discharge. Pt's son will come pick pt up. Husbands phone number in demographics

## 2022-09-28 NOTE — ED Notes (Signed)
Pt reports pain has returned after PT and movement

## 2022-09-28 NOTE — ED Notes (Signed)
Pt found to be soiled in stool. Linens and pants changed, pericare provided, purewick in place. Pt remains on full cardiac monitor. Pt complains of buttox pain. Large hematoma noted to left sided buttox

## 2022-09-28 NOTE — ED Triage Notes (Signed)
Pt arrived from home via [facility] fell today x 2. First fell on buttocks and second fall this evening hitting left side of head. Pt denies blood thinners a&o x 4. Does not recall either fall.

## 2022-09-28 NOTE — Evaluation (Signed)
Physical Therapy Evaluation Patient Details Name: Jessica [name] MRN: [id number] DOB: 04-May-1951 Today's Date: 09/28/2022  History of Present Illness  72 y.o. female presents to [facility] hospital on 09/28/2022 after multiple recent falls, concern for syncopal episodes. Pt with large left buttock hematoma from fall. PMH includes anemia, anxiety, CKD III, congenital blindness, DM, HTN, HLD, STEMI.  Clinical Impression  Pt presents to PT with deficits in functional mobility, gait, balance, power, endurance, and with L buttock pain. Pt has difficulty tolerating sitting at this time due to pain at hematoma site in L buttock. Pt ambulates with support of PT interlocking arms, similarly to how the patient utilizes her spouse for assistance when mobilizing out of the house due to her vision deficits. PT recommends use of quad cane along with support of the pt's spouse to improve stability and further reduce falls risk. Pt will benefit from HHPT in an effort to improve balance and to reduce the risk for potential falls.       Recommendations for follow up therapy are one component of a multi-disciplinary discharge planning process, led by the attending physician.  Recommendations may be updated based on patient status, additional functional criteria and insurance authorization.  Follow Up Recommendations Home health PT      Assistance Recommended at Discharge Intermittent Supervision/Assistance  Patient can return home with the following  A little help with walking and/or transfers;A little help with bathing/dressing/bathroom;Assistance with cooking/housework;Assist for transportation;Help with stairs or ramp for entrance    Equipment Recommendations None recommended by PT  Recommendations for Other Services       Functional Status Assessment Patient has had a recent decline in their functional status and demonstrates the ability to make significant improvements in function in a reasonable and predictable  amount of time.     Precautions / Restrictions Precautions Precautions: Fall Precaution Comments: blind Restrictions Weight Bearing Restrictions: No      Mobility  Bed Mobility Overal bed mobility: Needs Assistance Bed Mobility: Supine to Sit, Sit to Supine     Supine to sit: Supervision Sit to supine: Supervision        Transfers Overall transfer level: Needs assistance Equipment used: None Transfers: Sit to/from Stand Sit to Stand: Supervision                Ambulation/Gait Ambulation/Gait assistance: Min assist Gait Distance (Feet): 200 Feet Assistive device: 1 person hand held assist Gait Pattern/deviations: Step-through pattern Gait velocity: reduced Gait velocity interpretation: <1.8 ft/sec, indicate of risk for recurrent falls   General Gait Details: pt and PT locking arms, simulating pt gait with support of spouse in unfamiliar environments. Pt with increased lateral sway but no LOB noted with PT UE support  Stairs            Wheelchair Mobility    Modified Rankin (Stroke Patients Only)       Balance Overall balance assessment: Needs assistance Sitting-balance support: No upper extremity supported, Feet supported Sitting balance-Leahy Scale: Fair (brief periods of sitting due to pain in L buttock)     Standing balance support: Single extremity supported Standing balance-Leahy Scale: Poor Standing balance comment: benefits from hand hold or unilateral UE support                             Pertinent Vitals/Pain Pain Assessment Pain Assessment: Faces Faces Pain Scale: Hurts even more Pain Location: L buttock Pain Descriptors / Indicators: Aching Pain Intervention(s):  Monitored during session    Home Living Family/patient expects to be discharged to:: Private residence Living Arrangements: Spouse/significant other;Children Available Help at Discharge: Family Type of Home: House Home Access: Stairs to enter Entrance  Stairs-Rails: Can reach both Entrance Stairs-Number of Steps: 2   Home Layout: Two level;Able to live on main level with bedroom/bathroom Home Equipment: Cane - quad;Grab bars - tub/shower;Hand held shower head;Shower seat      Prior Function Prior Level of Function : Independent/Modified Independent             Mobility Comments: ambulates without DME inside the home, ambulates with support of spouse in the community ADLs Comments: family or friends assist with transportation     Hand Dominance   Dominant Hand: Right    Extremity/Trunk Assessment   Upper Extremity Assessment Upper Extremity Assessment: Overall WFL for tasks assessed    Lower Extremity Assessment Lower Extremity Assessment: Generalized weakness    Cervical / Trunk Assessment Cervical / Trunk Assessment: Kyphotic  Communication   Communication: No difficulties  Cognition Arousal/Alertness: Awake/alert Behavior During Therapy: WFL for tasks assessed/performed Overall Cognitive Status: Within Functional Limits for tasks assessed                                          General Comments General comments (skin integrity, edema, etc.): VSS, negative orthostatics documented in vitals flowsheet    Exercises     Assessment/Plan    PT Assessment Patient needs continued PT services  PT Problem List Decreased strength;Decreased activity tolerance;Decreased balance;Decreased mobility;Pain       PT Treatment Interventions DME instruction;Gait training;Stair training;Functional mobility training;Therapeutic activities;Therapeutic exercise;Balance training;Neuromuscular re-education;Patient/family education    PT Goals (Current goals can be found in the Care Plan section)  Acute Rehab PT Goals Patient Stated Goal: to go home, reduce pain in buttock PT Goal Formulation: With patient Time For Goal Achievement: 10/12/22 Potential to Achieve Goals: Good    Frequency Min 3X/week      Co-evaluation               AM-PAC PT "6 Clicks" Mobility  Outcome Measure Help needed turning from your back to your side while in a flat bed without using bedrails?: A Little Help needed moving from lying on your back to sitting on the side of a flat bed without using bedrails?: A Little Help needed moving to and from a bed to a chair (including a wheelchair)?: A Little Help needed standing up from a chair using your arms (e.g., wheelchair or bedside chair)?: A Little Help needed to walk in hospital room?: A Little Help needed climbing 3-5 steps with a railing? : A Lot 6 Click Score: 17    End of Session   Activity Tolerance: Patient tolerated treatment well Patient left: in bed;with call bell/phone within reach Nurse Communication: Mobility status PT Visit Diagnosis: Other abnormalities of gait and mobility (R26.89);Muscle weakness (generalized) (M62.81);Repeated falls (R29.6)    Time: [id number] PT Time Calculation (min) (ACUTE ONLY): 23 min   Charges:   PT Evaluation $PT Eval Low Complexity: Hitchcock, PT, DPT Acute Rehabilitation Office [phone]   Zenaida Niece 09/28/2022, 3:22 PM

## 2022-09-29 ENCOUNTER — Encounter (HOSPITAL_COMMUNITY): Payer: Self-pay | Admitting: Student

## 2022-09-29 ENCOUNTER — Observation Stay (HOSPITAL_BASED_OUTPATIENT_CLINIC_OR_DEPARTMENT_OTHER): Payer: Medicare HMO

## 2022-09-29 ENCOUNTER — Other Ambulatory Visit (HOSPITAL_COMMUNITY): Payer: Self-pay

## 2022-09-29 DIAGNOSIS — T148XXA Other injury of unspecified body region, initial encounter: Secondary | ICD-10-CM | POA: Diagnosis not present

## 2022-09-29 DIAGNOSIS — R55 Syncope and collapse: Secondary | ICD-10-CM

## 2022-09-29 DIAGNOSIS — S0990XA Unspecified injury of head, initial encounter: Secondary | ICD-10-CM | POA: Diagnosis not present

## 2022-09-29 LAB — CBC
HCT: 27.9 % — ABNORMAL LOW (ref 36.0–46.0)
Hemoglobin: 9.1 g/dL — ABNORMAL LOW (ref 12.0–15.0)
MCH: 31 pg (ref 26.0–34.0)
MCHC: 32.6 g/dL (ref 30.0–36.0)
MCV: 94.9 fL (ref 80.0–100.0)
Platelets: 231 10*3/uL (ref 150–400)
RBC: 2.94 MIL/uL — ABNORMAL LOW (ref 3.87–5.11)
RDW: 13.2 % (ref 11.5–15.5)
WBC: 6.9 10*3/uL (ref 4.0–10.5)
nRBC: 0 % (ref 0.0–0.2)

## 2022-09-29 LAB — GLUCOSE, CAPILLARY
Glucose-Capillary: 140 mg/dL — ABNORMAL HIGH (ref 70–99)
Glucose-Capillary: 181 mg/dL — ABNORMAL HIGH (ref 70–99)
Glucose-Capillary: 164 mg/dL — ABNORMAL HIGH (ref 70–99)
Glucose-Capillary: 172 mg/dL — ABNORMAL HIGH (ref 70–99)

## 2022-09-29 LAB — BASIC METABOLIC PANEL WITH GFR
Anion gap: 8 (ref 5–15)
BUN: 15 mg/dL (ref 8–23)
CO2: 21 mmol/L — ABNORMAL LOW (ref 22–32)
Calcium: 8.5 mg/dL — ABNORMAL LOW (ref 8.9–10.3)
Chloride: 109 mmol/L (ref 98–111)
Creatinine, Ser: 1.9 mg/dL — ABNORMAL HIGH (ref 0.44–1.00)
GFR, Estimated: 28 mL/min — ABNORMAL LOW
Glucose, Bld: 139 mg/dL — ABNORMAL HIGH (ref 70–99)
Potassium: 4.8 mmol/L (ref 3.5–5.1)
Sodium: 138 mmol/L (ref 135–145)

## 2022-09-29 LAB — ECHOCARDIOGRAM COMPLETE
AR max vel: 1.91 cm2
AV Area VTI: 1.85 cm2
AV Area mean vel: 1.87 cm2
AV Mean grad: 3 mmHg
AV Peak grad: 5.1 mmHg
Ao pk vel: 1.13 m/s
Area-P 1/2: 3.48 cm2
Height: 61 in
S' Lateral: 2.8 cm
Weight: 1776 oz

## 2022-09-29 LAB — HEMOGLOBIN A1C
Hgb A1c MFr Bld: 7.7 % — ABNORMAL HIGH (ref 4.8–5.6)
Mean Plasma Glucose: 174 mg/dL

## 2022-09-29 MED ORDER — ACETAMINOPHEN 325 MG PO TABS
650.0000 mg | ORAL_TABLET | Freq: Four times a day (QID) | ORAL | 0 refills | Status: AC | PRN
  Filled 2022-09-29: qty 240, 30d supply, fill #0

## 2022-09-29 NOTE — Progress Notes (Signed)
   09/29/22 1157  Mobility  Activity Ambulated with assistance in hallway  Level of Assistance Contact guard assist, steadying assist  Assistive Device  (HHA)  Distance Ambulated (ft) 300 ft  Activity Response Tolerated well  Mobility Referral Yes  $Mobility charge 1 Mobility   Mobility Specialist Progress Note  Pt was in bed and agreeable. Had no c/o pain. Left in chair w/ all needs met and call bell in reach.   Lucious Groves Mobility Specialist  Please contact via SecureChat or Rehab office at [phone]

## 2022-09-29 NOTE — TOC Initial Note (Signed)
Transition of Care [facility]) - Initial/Assessment Note    Patient Details  Name: Jessica [name] MRN: [id number] Date of Birth: [birth year]  [facility] CM/SW Contact:    Carles Collet, RN Phone Number: 09/29/2022, 3:11 PM  Clinical Narrative:                  Spoke w patient and family (spouse, daughter and son in law)   at bedside.  She states that she has had [place] services in the past and her and family agree that they can assist with any PT recommendations at home and all agree upon declined a referral for [facility] services at this time.  Patient has needed DME at home.  No TOC identified at this time.   Expected Discharge Plan: Home/Self Care Barriers to Discharge: Continued Medical Work up   Patient Goals and CMS Choice Patient states their goals for this hospitalization and ongoing recovery are:: to go home   Choice offered to / list presented to : NA      Expected Discharge Plan and Services   Discharge Planning Services: CM Consult   Living arrangements for the past 2 months: Single Family Home                                      Prior Living Arrangements/Services Living arrangements for the past 2 months: Single Family Home Lives with:: Spouse                   Activities of Daily Living Home Assistive Devices/Equipment: None ADL Screening (condition at time of admission) Patient's cognitive ability adequate to safely complete daily activities?: Yes Is the patient deaf or have difficulty hearing?: No Does the patient have difficulty seeing, even when wearing glasses/contacts?: Yes Does the patient have difficulty concentrating, remembering, or making decisions?: No Patient able to express need for assistance with ADLs?: Yes Does the patient have difficulty dressing or bathing?: Yes Independently performs ADLs?: Yes (appropriate for developmental age) Does the patient have difficulty walking or climbing stairs?: Yes Weakness of Legs:  Both Weakness of Arms/Hands: None  Permission Sought/Granted                  Emotional Assessment              Admission diagnosis:  Syncope and collapse [R55] Hematoma [T14.8XXA] Injury of head, initial encounter [S09.90XA] Patient Active Problem List   Diagnosis Date Noted   Syncope and collapse 09/28/2022   History of CAD (coronary artery disease) 09/28/2022   Hematoma 09/28/2022   Hyperkalemia 09/28/2022   Hyponatremia 09/28/2022   Fall at home, initial encounter 09/28/2022   Head injury 09/28/2022   Goals of care, counseling/discussion 09/28/2022   DNR (do not resuscitate) 09/28/2022   Palpitations 03/08/2022   Abdominal wall hematoma 05/13/2020   Hypertension 01/14/2020   CKD (chronic kidney disease), stage IV ([place]) 01/14/2020   Anemia 01/14/2020   Anxiety 01/14/2020   Acute ST elevation myocardial infarction (STEMI) ([place]) 12/30/2019   STEMI (ST elevation myocardial infarction) ([place]) 12/30/2019   STEMI involving oth coronary artery of inferior wall ([place]) 12/30/2019   Dysphagia 12/06/2017   History of esophageal stricture 12/06/2017   Nausea with vomiting 05/21/2013   Hypothyroidism 11/15/2007   Type 2 diabetes mellitus with complication, without long-term current use of insulin ([place]) 11/15/2007   Hyperlipidemia LDL goal <70 11/15/2007  Visual impairment 11/15/2007   ESOPHAGEAL STRICTURE 02/08/2007   GASTROESOPHAGEAL REFLUX DISEASE 02/08/2007   HIATAL HERNIA 02/08/2007   PCP:  Ginger Organ., MD Pharmacy:   [id number] [place]Phone: [phone]: [phone] [address]Phone: [phone]: [phone] [facility] [address]Phone: [phone]: [phone]    Social Determinants  of Health (SDOH) Social History: SDOH Screenings   Tobacco Use: Low Risk  (09/29/2022)   SDOH Interventions:     Readmission Risk Interventions    05/16/2020   12:09 PM  Readmission Risk Prevention Plan  Post Dischage Appt Complete  Medication Screening Complete  Transportation Screening Complete

## 2022-09-29 NOTE — Discharge Summary (Signed)
Physician Discharge Summary   Patient: Jessica [name] MRN: [id number] DOB: 01/06/1951  Admit date:     09/28/2022  Discharge date: 09/29/22  Discharge Physician: Marylu Lund   PCP: Ginger Organ., MD   Recommendations at discharge:    Follow up with PCP in 1-2 weeks Follow up with Cardiology as scheduled  Discharge Diagnoses: Principal Problem:   Syncope and collapse Active Problems:   Hypothyroidism   Type 2 diabetes mellitus with complication, without long-term current use of insulin (HCC)   Hyperlipidemia LDL goal <70   Visual impairment   GASTROESOPHAGEAL REFLUX DISEASE   Hypertension   Anemia   Anxiety   History of CAD (coronary artery disease)   Hematoma   Hyperkalemia   Hyponatremia   Fall at home, initial encounter   Head injury   Goals of care, counseling/discussion   DNR (do not resuscitate)  Resolved Problems:   * No resolved hospital problems. [facility] Course: 72 year old F with impaired vision, CAD s/p PCI in 12/2019, NIDDM-2, CKD-4, anemia, esophageal stricture, GERD, anxiety, HTN, HLD and hypothyroidism brought to ED by EMS due to fall.  Patient tripped over one of her shoes and fell on her left side hitting her buttock.  She noticed bruising but did not feel it was significant enough to come to the hospital.  However, she was unable to get up into her bed and slept on recliner.  Later, she tried to get out of the recliner and could not remember what happened afterward, and she was brought to the hospital.  Patient denies prodrome leading to syncope.  She denies chest pain, shortness of breath or palpitation.  She thinks she had some vomiting and bowel movement after the incident.  She denies recent fall or syncope.  She has some URI symptoms over the last 1 week.  She reports drinking fluids well but has not been eating well.  She is on low-dose aspirin.  Not on any other antiplatelet or anticoagulation's.  She has left buttock pain that has improved with  pain medication.   In ED, stable vitals. Na 129.  K5.3. Cr 1.98 (baseline 1.7-1.9).  BUN 17.  WBC 19.6 with left shift.  Hgb 11.2.  UA > 500 glucose but no other significant finding.  COVID-19, influenza and RSV PCR nonreactive.  Hemoccult negative.  CT head without acute finding.  Coag labs negative.  CT abdomen and pelvis showed 6 x 4 x 11 cm left buttock hematoma and a stable 1.5 x 1.2 cm lingular nodule.  Patient was started on IV fluid.  Hospitalist service called for admission for "AKI" and syncope.  Assessment and Plan: Fall at home/syncope: First fall seems to be mechanical after tripping.  Second fall looks like syncope.  No prodromes.  She might have been dehydrated from recent URI.  She is legally blind.  CT head and MRI brain without acute finding.  EKG normal sinus rhythm without significant finding.  She had 30-day event monitor in August last year that did not reveal anything.  She has mild hyponatremia with mild hyperkalemia.  Low suspicion for infectious process at this time.  Doubt seizure.  No focal neurodeficit. -remained stable on tele -Given IVF hydration this visit -echocardiogram reviewed, normal LVEF, unremarkable -PT/OT eval with recs for HHPT. Pt declined HH   Left buttock hematoma: Due to fall.  She is on low-dose aspirin.  Not on anticoagulation.  Hgb initially 11, down to 9.0 and remained stable -Hold aspirin  Hyponatremia/hyperkalemia/CKD-4: Cr seems to be slightly higher than baseline at baseline. Na 129.  K5.3. -Improved with IVF   NIDDM-2 with hyperglycemia: A1c 7.1% in 02/2022.  Seems to be on Jardiance and Trulicity. -cont home meds on d/c   Leukocytosis/bandemia: No clear source of infection yet.  She is not febrile.  Likely demargination. -resolved       Consultants:  Procedures performed:   Disposition: Home Diet recommendation:  Cardiac diet DISCHARGE MEDICATION: Allergies as of 09/29/2022       Reactions   Codeine Shortness Of Breath, Nausea  And Vomiting   Erythromycin Nausea And Vomiting   Levaquin [levofloxacin] Shortness Of Breath   Asa [aspirin] Other (See Comments)   Pt told to avoid aspirin due to her heart.   Motrin [ibuprofen] Other (See Comments)   Pt told to avoid ibuprofen due to her heart.   Ultram [tramadol] Nausea And Vomiting        Medication List     STOP taking these medications    aspirin EC 81 MG tablet       TAKE these medications    acetaminophen 325 MG tablet Commonly known as: TYLENOL Take 2 tablets (650 mg total) by mouth every 6 (six) hours as needed.   CO Q 10 PO Take 1 capsule by mouth daily.   cyanocobalamin 1000 MCG/ML injection Commonly known as: VITAMIN B12 Inject 1,000 mcg into the muscle See admin instructions. 1000 mcg on the 10th of every month, every 30 days.   empagliflozin 10 MG Tabs tablet Commonly known as: JARDIANCE Take 1 tablet (10 mg total) by mouth daily.   famotidine 40 MG tablet Commonly known as: PEPCID TAKE 1 TABLET TWICE DAILY   hydrALAZINE 10 MG tablet Commonly known as: APRESOLINE Take 1 tablet (10 mg total) by mouth 3 (three) times daily as needed. hydralazine 10 mg as needed sbp greater than 160. Please  keep a blood pressure diary. What changed:  reasons to take this additional instructions   levothyroxine 50 MCG tablet Commonly known as: SYNTHROID Take 50 mcg by mouth daily before breakfast.   LORazepam 0.5 MG tablet Commonly known as: ATIVAN Take 0.5 mg by mouth 2 (two) times daily as needed for anxiety.   magnesium oxide 400 MG tablet Commonly known as: MAG-OX Take 400 mg by mouth daily.   metoprolol succinate 50 MG 24 hr tablet Commonly known as: TOPROL-XL Take 50 mg by mouth daily.   nitroGLYCERIN 0.4 MG SL tablet Commonly known as: NITROSTAT Place 1 tablet (0.4 mg total) under the tongue every 5 (five) minutes x 3 doses as needed for chest pain.   pantoprazole 40 MG tablet Commonly known as: PROTONIX Take 40 mg by mouth 2  (two) times daily.   PROBIOTIC PO Take 1 capsule by mouth 2 (two) times daily.   rosuvastatin 40 MG tablet Commonly known as: CRESTOR Take 1 tablet (40 mg total) by mouth daily. What changed: when to take this   sertraline 50 MG tablet Commonly known as: ZOLOFT Take 50 mg by mouth at bedtime.   Trulicity 1.5 0000000 Sopn Generic drug: Dulaglutide Inject 1.5 mg into the skin every Wednesday.        Follow-up Information     Ginger Organ., MD Follow up in 1 week(s).   Specialty: Internal Medicine Why: Hospital follow up Contact information: [address] [place] [address] [phone]         Troy Sine, MD Follow up.  Specialty: Cardiology Why: Hospital follow up, as scheduled Contact information: [address] [place] [address] [phone]                Discharge Exam: Danley Danker Weights   09/28/22 0116  Weight: 50.3 kg   General exam: Awake, laying in bed, in nad Respiratory system: Normal respiratory effort, no wheezing Cardiovascular system: regular rate, s1, s2 Gastrointestinal system: Soft, nondistended, positive BS Central nervous system: CN2-12 grossly intact, strength intact Extremities: Perfused, no clubbing Skin: Normal skin turgor, no notable skin lesions seen Psychiatry: Mood normal // no visual hallucinations   Condition at discharge: fair  The results of significant diagnostics from this hospitalization (including imaging, microbiology, ancillary and laboratory) are listed below for reference.   Imaging Studies: ECHOCARDIOGRAM COMPLETE  Result Date: 09/29/2022    ECHOCARDIOGRAM REPORT   Patient Name:   NADEZHDA SHIFLET Date of Exam: 09/29/2022 Medical Rec #:  [id number]     Height:       61.0 in Accession #:    [id number]    Weight:       111.0 lb Date of Birth:  [birth year]    BSA:          1.471 m Patient Age:    23 years      BP:           117/67 mmHg Patient Gender: F             HR:           82  bpm. Exam Location:  Inpatient Procedure: 2D Echo, Cardiac Doppler and Color Doppler Indications:    Syncope R55  History:        Patient has prior history of Echocardiogram examinations, most                 recent 12/31/2019. CAD and Previous Myocardial Infarction; Risk                 Factors:Hypertension, Dyslipidemia and Diabetes. CKD, stage 4.  Sonographer:    Ronny Flurry Referring Phys: [place]  1. Left ventricular ejection fraction, by estimation, is 60 to 65%. The left ventricle has normal function. The left ventricle has no regional wall motion abnormalities. Left ventricular diastolic parameters were normal.  2. Right ventricular systolic function is normal. The right ventricular size is normal.  3. The mitral valve is normal in structure. No evidence of mitral valve regurgitation. No evidence of mitral stenosis.  4. The aortic valve is normal in structure. Aortic valve regurgitation is not visualized. No aortic stenosis is present.  5. The inferior vena cava is normal in size with greater than 50% respiratory variability, suggesting right atrial pressure of 3 mmHg. FINDINGS  Left Ventricle: Left ventricular ejection fraction, by estimation, is 60 to 65%. The left ventricle has normal function. The left ventricle has no regional wall motion abnormalities. The left ventricular internal cavity size was normal in size. There is  no left ventricular hypertrophy. Left ventricular diastolic parameters were normal. Right Ventricle: The right ventricular size is normal. No increase in right ventricular wall thickness. Right ventricular systolic function is normal. Left Atrium: Left atrial size was normal in size. Right Atrium: Right atrial size was normal in size. Pericardium: There is no evidence of pericardial effusion. Mitral Valve: The mitral valve is normal in structure. No evidence of mitral valve regurgitation. No evidence of mitral valve stenosis. Tricuspid Valve: The tricuspid  valve is  normal in structure. Tricuspid valve regurgitation is not demonstrated. No evidence of tricuspid stenosis. Aortic Valve: The aortic valve is normal in structure. Aortic valve regurgitation is not visualized. No aortic stenosis is present. Aortic valve mean gradient measures 3.0 mmHg. Aortic valve peak gradient measures 5.1 mmHg. Aortic valve area, by VTI measures 1.85 cm. Pulmonic Valve: The pulmonic valve was normal in structure. Pulmonic valve regurgitation is not visualized. No evidence of pulmonic stenosis. Aorta: The aortic root is normal in size and structure. Venous: The inferior vena cava is normal in size with greater than 50% respiratory variability, suggesting right atrial pressure of 3 mmHg. IAS/Shunts: No atrial level shunt detected by color flow Doppler.  LEFT VENTRICLE PLAX 2D LVIDd:         4.10 cm   Diastology LVIDs:         2.80 cm   LV e' medial:    6.53 cm/s LV PW:         0.70 cm   LV E/e' medial:  9.6 LV IVS:        0.90 cm   LV e' lateral:   9.14 cm/s LVOT diam:     2.00 cm   LV E/e' lateral: 6.8 LV SV:         41 LV SV Index:   28 LVOT Area:     3.14 cm  RIGHT VENTRICLE            IVC RV S prime:     7.72 cm/s  IVC diam: 0.80 cm TAPSE (M-mode): 1.8 cm LEFT ATRIUM             Index        RIGHT ATRIUM          Index LA diam:        2.80 cm 1.90 cm/m   RA Area:     6.64 cm LA Vol (A2C):   20.1 ml 13.67 ml/m  RA Volume:   8.61 ml  5.85 ml/m LA Vol (A4C):   22.3 ml 15.16 ml/m LA Biplane Vol: 22.8 ml 15.50 ml/m  AORTIC VALVE AV Area (Vmax):    1.91 cm AV Area (Vmean):   1.87 cm AV Area (VTI):     1.85 cm AV Vmax:           113.00 cm/s AV Vmean:          75.300 cm/s AV VTI:            0.219 m AV Peak Grad:      5.1 mmHg AV Mean Grad:      3.0 mmHg LVOT Vmax:         68.65 cm/s LVOT Vmean:        44.750 cm/s LVOT VTI:          0.129 m LVOT/AV VTI ratio: 0.59  AORTA Ao Root diam: 2.90 cm Ao Asc diam:  2.80 cm MITRAL VALVE MV Area (PHT): 3.48 cm    SHUNTS MV Decel Time: 218 msec     Systemic VTI:  0.13 m MV E velocity: 62.40 cm/s  Systemic Diam: 2.00 cm MV A velocity: 94.10 cm/s MV E/A ratio:  0.66 Aditya Sabharwal Electronically signed by Hebert Soho Signature Date/Time: 09/29/2022/1:40:00 PM    Final    MR BRAIN WO CONTRAST  Result Date: 09/28/2022 CLINICAL DATA:  Syncope/presyncope, cerebrovascular cause suspected. EXAM: MRI HEAD WITHOUT CONTRAST TECHNIQUE: Multiplanar, multiecho pulse sequences of the brain and surrounding structures were obtained without  intravenous contrast. COMPARISON:  Head CT 09/28/2022.  MRI brain 03/02/2021. FINDINGS: Brain: No acute infarct or intracranial hemorrhage. Unchanged encephalomalacia along the right postcentral gyrus and left medial occipital lobes. 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 Single focus of microhemorrhage along the right frontal operculum. No hydrocephalus or extra-axial collection. No mass or midline shift. Vascular: Normal flow voids. Skull and upper cervical spine: Normal marrow signal. Sinuses/Orbits: Unremarkable. Other: Left parietal scalp contusion. IMPRESSION: 1. No acute intracranial abnormality or mass. 2. 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 Electronically Signed   By: Emmit Alexanders M.D.   On: 09/28/2022 11:24   CT ABDOMEN PELVIS WO CONTRAST  Result Date: 09/28/2022 CLINICAL DATA:  Fall injury with left lower quadrant pain and impact to the buttocks. EXAM: CT ABDOMEN AND PELVIS WITHOUT CONTRAST TECHNIQUE: Multidetector CT imaging of the abdomen and pelvis was performed following the standard protocol without IV contrast. RADIATION DOSE REDUCTION: This exam was performed according to the departmental dose-optimization program which includes automated exposure control, adjustment of the mA and/or kV according to patient size and/or use of iterative reconstruction technique. COMPARISON:  Chest CT without  contrast 06/30/2021 and 08/01/2019, CT abdomen and pelvis with oral contrast only dated 04/18/2006. FINDINGS: Lower chest: Again noted is a 1.5 x 1.2 cm rounded lingular noncalcified nodule. This is unchanged dating back to the 2021 chest CT. In 2007 this measured 1 x 0.8 cm but a benign entity is still favored given 3 years stability since 2021. The lung bases are otherwise clear. There is a small hiatal hernia. The cardiac size is normal. Hepatobiliary: The liver is unremarkable without contrast. Gallbladder is absent with chronic postcholecystectomy common bile duct dilatation to 11 mm. Pancreas: Unremarkable without contrast. Spleen: Unremarkable without contrast. No splenic injury or perisplenic hematoma. Adrenals/Urinary Tract: No adrenal or renal hemorrhage or hematoma. Both kidneys are small in length, right measuring 7.4 cm length, left is 8 cm with bilateral mild generalized cortical volume loss. There is no contour deforming mass of either unenhanced kidney. There is no urinary stone or obstruction. There is no bladder thickening. Stomach/Bowel: Small hiatal hernia. Otherwise unremarkable stomach. There is no small bowel dilatation or inflammation. The appendix is normal. There is diverticulosis along the colon without evidence of colitis or focal diverticulitis. Vascular/Lymphatic: Aortic atherosclerosis. No enlarged abdominal or pelvic lymph nodes. Reproductive: Status post hysterectomy. No adnexal masses. Other: Numerous pelvic phleboliths. There are bilateral inguinal fat hernias. There is no incarcerated hernia. There is free air, free hemorrhage or free ascites. Musculoskeletal: There is a subcutaneous hematoma in the lower left buttock measuring 5.9 x 4 x 11 cm. There is surrounding moderate stranding in the left buttock almost certainly posttraumatic although infectious process could appear similar. No gluteal intramuscular hematoma is seen. There is mild chronic compression fracture of T12 and  L2, and L2 with again noted kyphoplasty cement. No regional acute skeletal fracture is seen. There is osteopenia and degenerative change of the lumbar spine. IMPRESSION: 1. 5.9 x 4 x 11 cm subcutaneous hematoma in the lower left buttock with moderate surrounding stranding. 2. No other acute trauma related findings are seen in the abdomen or pelvis without contrast. 3. 1.5 x 1.2 cm lingular nodule, unchanged dating back to the 2021 chest CT. In 2007 this measured 1 x 0.8 cm, but a benign entity is still favored given 3 years stability since 2021. 4. Small  hiatal hernia. 5. Diverticulosis without evidence of diverticulitis. 6. Aortic atherosclerosis. 7. Bilateral inguinal fat hernias. 8. Osteopenia and degenerative change. Chronic compression fractures of T12, L2. Aortic Atherosclerosis (ICD10-I70.0). Electronically Signed   By: Telford Nab M.D.   On: 09/28/2022 07:26   CT Cervical Spine Wo Contrast  Result Date: 09/28/2022 CLINICAL DATA:  Neck trauma (Age >= 65y).  Fall. EXAM: CT CERVICAL SPINE WITHOUT CONTRAST TECHNIQUE: Multidetector CT imaging of the cervical spine was performed without intravenous contrast. Multiplanar CT image reconstructions were also generated. RADIATION DOSE REDUCTION: This exam was performed according to the departmental dose-optimization program which includes automated exposure control, adjustment of the mA and/or kV according to patient size and/or use of iterative reconstruction technique. COMPARISON:  None Available. FINDINGS: Alignment: No subluxation Skull base and vertebrae: No acute fracture. No primary bone lesion or focal pathologic process. Soft tissues and spinal canal: No prevertebral fluid or swelling. No visible canal hematoma. Disc levels: Moderate diffuse degenerative disc disease. Advanced bilateral degenerative facet disease, left greater than right. Upper chest: No acute findings Other: None IMPRESSION: Degenerative disc and facet disease. No acute bony  abnormality. Electronically Signed   By: Rolm Baptise M.D.   On: 09/28/2022 03:41   CT Head Wo Contrast  Result Date: 09/28/2022 CLINICAL DATA:  Fall EXAM: CT HEAD WITHOUT CONTRAST TECHNIQUE: Contiguous axial images were obtained from the base of the skull through the vertex without intravenous contrast. RADIATION DOSE REDUCTION: This exam was performed according to the departmental dose-optimization program which includes automated exposure control, adjustment of the mA and/or kV according to patient size and/or use of iterative reconstruction technique. COMPARISON:  03/01/2021 FINDINGS: Brain: Prior left occipital infarct again noted, prior infarcts in the left occipital lobe and posterior right parietal lobe, stable. There is atrophy and chronic small vessel disease changes. No acute intracranial abnormality. Specifically, no hemorrhage, hydrocephalus, mass lesion, acute infarction, or significant intracranial injury. Vascular: No hyperdense vessel or unexpected calcification. Skull: No acute calvarial abnormality. 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 Atrophy, chronic microvascular disease. No acute intracranial abnormality. Electronically Signed   By: Rolm Baptise M.D.   On: 09/28/2022 03:39   DG Humerus Left  Result Date: 09/28/2022 CLINICAL DATA:  Recent fall with left arm pain, initial encounter EXAM: LEFT HUMERUS - 2+ VIEW COMPARISON:  None Available. FINDINGS: There is no evidence of fracture or other focal bone lesions. Soft tissues are unremarkable. IMPRESSION: No acute abnormality noted. Electronically Signed   By: Inez Catalina M.D.   On: 09/28/2022 02:59   DG Chest Portable 1 View  Result Date: 09/28/2022 CLINICAL DATA:  Fall EXAM: PORTABLE CHEST 1 VIEW COMPARISON:  03/08/2022 FINDINGS: The heart size and mediastinal contours are within normal limits. Both lungs are clear. The visualized skeletal structures are unremarkable.  IMPRESSION: No active disease. Electronically Signed   By: Rolm Baptise M.D.   On: 09/28/2022 02:49   DG Hip Unilat With Pelvis 2-3 Views Left  Result Date: 09/28/2022 CLINICAL DATA:  Fall EXAM: DG HIP (WITH OR WITHOUT PELVIS) 2-3V LEFT COMPARISON:  Right hip series and pelvis today. FINDINGS: No acute bony abnormality. Specifically, no fracture, subluxation, or dislocation. Vascular calcifications and phleboliths in the pelvis. IMPRESSION: No acute bony abnormality. Electronically Signed   By: Rolm Baptise M.D.   On: 09/28/2022 02:33   DG Hip Unilat  With Pelvis 2-3 Views Right  Result Date: 09/28/2022 CLINICAL DATA:  Fall EXAM: DG HIP (  WITH OR WITHOUT PELVIS) 2-3V RIGHT COMPARISON:  None Available. FINDINGS: There is no evidence of hip fracture or dislocation. There is no evidence of arthropathy or other focal bone abnormality. Vascular calcifications noted and multiple phleboliths in the pelvis. IMPRESSION: No acute bony abnormality. Electronically Signed   By: Rolm Baptise M.D.   On: 09/28/2022 02:33    Microbiology: Results for orders placed or performed during the hospital encounter of 09/28/22  Resp panel by RT-PCR (RSV, Flu A&B, Covid) Anterior Nasal Swab     Status: None   Collection Time: 09/28/22  3:05 AM   Specimen: Anterior Nasal Swab  Result Value Ref Range Status   SARS Coronavirus 2 by RT PCR NEGATIVE NEGATIVE Final   Influenza A by PCR NEGATIVE NEGATIVE Final   Influenza B by PCR NEGATIVE NEGATIVE Final    Comment: (NOTE) The Xpert Xpress SARS-CoV-2/FLU/RSV plus assay is intended as an aid in the diagnosis of influenza from Nasopharyngeal swab specimens and should not be used as a sole basis for treatment. Nasal washings and aspirates are unacceptable for Xpert Xpress SARS-CoV-2/FLU/RSV testing.  Fact Sheet for Patients: EntrepreneurPulse.com.au  Fact Sheet for Healthcare Providers: IncredibleEmployment.be  This test is not yet  approved or cleared by the [place] FDA and has been authorized for detection and/or diagnosis of SARS-CoV-2 by FDA under an Emergency Use Authorization (EUA). This EUA will remain in effect (meaning this test can be used) for the duration of the COVID-19 declaration under Section 564(b)(1) of the Act, 21 U.S.C. section 360bbb-3(b)(1), unless the authorization is terminated or revoked.     Resp Syncytial Virus by PCR NEGATIVE NEGATIVE Final    Comment: (NOTE) Fact Sheet for Patients: EntrepreneurPulse.com.au  Fact Sheet for Healthcare Providers: IncredibleEmployment.be  This test is not yet approved or cleared by the [place] FDA and has been authorized for detection and/or diagnosis of SARS-CoV-2 by FDA under an Emergency Use Authorization (EUA). This EUA will remain in effect (meaning this test can be used) for the duration of the COVID-19 declaration under Section 564(b)(1) of the Act, 21 U.S.C. section 360bbb-3(b)(1), unless the authorization is terminated or revoked.  Performed at [facility], [place] [address], [place], [place] [address]     Labs: CBC: Recent Labs  Lab 09/28/22 0305 09/28/22 1809 09/29/22 0314  WBC 19.6* 9.7 6.9  NEUTROABS 16.6*  --   --   HGB 11.2* 9.0* 9.1*  HCT 34.9* 26.4* 27.9*  MCV 95.4 93.0 94.9  PLT 294 232 [id number]   Basic Metabolic Panel: Recent Labs  Lab 09/28/22 0305 09/28/22 1809 09/29/22 0314  NA 129* 135 138  K 5.3* 4.7 4.8  CL 100 107 109  CO2 22 22 21*  GLUCOSE 200* 149* 139*  BUN '17 17 15  '$ CREATININE 1.98* 2.02* 1.90*  CALCIUM 9.1 8.5* 8.5*  PHOS  --  3.4  --    Liver Function Tests: Recent Labs  Lab 09/28/22 0305 09/28/22 1809  AST 34  --   ALT 33  --   ALKPHOS 28*  --   BILITOT 0.5  --   PROT 6.5  --   ALBUMIN 3.8 3.2*   CBG: Recent Labs  Lab 09/28/22 1732 09/28/22 2038 09/29/22 0812 09/29/22 1235 09/29/22 1639  GLUCAP 186* 173* 140* 181* 164*     Discharge time spent: less than 30 minutes.  Signed: Marylu Lund, MD [facility] 09/29/2022

## 2022-09-29 NOTE — Hospital Course (Signed)
72 year old F with impaired vision, CAD s/p PCI in 12/2019, NIDDM-2, CKD-4, anemia, esophageal stricture, GERD, anxiety, HTN, HLD and hypothyroidism brought to ED by EMS due to fall.  Patient tripped over one of her shoes and fell on her left side hitting her buttock.  She noticed bruising but did not feel it was significant enough to come to the hospital.  However, she was unable to get up into her bed and slept on recliner.  Later, she tried to get out of the recliner and could not remember what happened afterward, and she was brought to the hospital.  Patient denies prodrome leading to syncope.  She denies chest pain, shortness of breath or palpitation.  She thinks she had some vomiting and bowel movement after the incident.  She denies recent fall or syncope.  She has some URI symptoms over the last 1 week.  She reports drinking fluids well but has not been eating well.  She is on low-dose aspirin.  Not on any other antiplatelet or anticoagulation's.  She has left buttock pain that has improved with pain medication.   In ED, stable vitals. Na 129.  K5.3. Cr 1.98 (baseline 1.7-1.9).  BUN 17.  WBC 19.6 with left shift.  Hgb 11.2.  UA > 500 glucose but no other significant finding.  COVID-19, influenza and RSV PCR nonreactive.  Hemoccult negative.  CT head without acute finding.  Coag labs negative.  CT abdomen and pelvis showed 6 x 4 x 11 cm left buttock hematoma and a stable 1.5 x 1.2 cm lingular nodule.  Patient was started on IV fluid.  Hospitalist service called for admission for "AKI" and syncope.

## 2022-09-29 NOTE — Evaluation (Addendum)
Occupational Therapy Evaluation Patient Details Name: Jessica [name] MRN: [id number] DOB: 1950-07-28 Today's Date: 09/29/2022   History of Present Illness 72 y.o. female presents to [facility] hospital on 09/28/2022 after multiple recent falls, concern for syncopal episodes. Pt with large left buttock hematoma from fall. PMH includes anemia, anxiety, CKD III, congenital blindness, DM, HTN, HLD, STEMI.   Clinical Impression   Pt presents at Sup - min guard A level with ADLs and mobility (1 person HHA) as pt in unfamiliar environment. PTA pt lived at home with her husband and was Mod I with ADLs/selfcare, home mgt and required HHA of spouse for mobility intermittently. Pt eager to return home. Pt very pleasant and cooperative. All education completed. No further acute OT services indicated at this time     Recommendations for follow up therapy are one component of a multi-disciplinary discharge planning process, led by the attending physician.  Recommendations may be updated based on patient status, additional functional criteria and insurance authorization.   Follow Up Recommendations  No OT follow up     Assistance Recommended at Discharge PRN  Patient can return home with the following A little help with bathing/dressing/bathroom;A little help with walking and/or transfers    Functional Status Assessment  Patient has not had a recent decline in their functional status  Equipment Recommendations  None recommended by OT    Recommendations for Other Services       Precautions / Restrictions Precautions Precautions: Fall Precaution Comments: blind Restrictions Weight Bearing Restrictions: No      Mobility Bed Mobility Overal bed mobility: Needs Assistance Bed Mobility: Supine to Sit, Sit to Supine     Supine to sit: Supervision Sit to supine: Supervision        Transfers Overall transfer level: Needs assistance Equipment used: 1 person hand held assist   Sit to Stand:  Supervision                  Balance Overall balance assessment: Needs assistance Sitting-balance support: No upper extremity supported, Feet supported Sitting balance-Leahy Scale: Good     Standing balance support: Single extremity supported Standing balance-Leahy Scale: Poor Standing balance comment: benefits from hand hold or unilateral UE support                           ADL either performed or assessed with clinical judgement   ADL                                         General ADL Comments: Sup - min guard A with bathing, dressing and toileting. Pt is blind and in unfamilair environment     Vision Baseline Vision/History:  (blind) Patient Visual Report: No change from baseline;Other (comment) (pt blind)       Perception     Praxis      Pertinent Vitals/Pain Pain Assessment Pain Assessment: 0-10 Pain Score: 4  Pain Descriptors / Indicators: Aching, Sore Pain Intervention(s): Monitored during session, Repositioned     Hand Dominance Right   Extremity/Trunk Assessment Upper Extremity Assessment Upper Extremity Assessment: Overall WFL for tasks assessed       Cervical / Trunk Assessment Cervical / Trunk Assessment: Kyphotic   Communication Communication Communication: No difficulties   Cognition Arousal/Alertness: Awake/alert Behavior During Therapy: WFL for tasks assessed/performed Overall Cognitive Status: Within Functional Limits for  tasks assessed                                       General Comments       Exercises     Shoulder Instructions      Home Living Family/patient expects to be discharged to:: Private residence Living Arrangements: Spouse/significant other Available Help at Discharge: Family Type of Home: House Home Access: Stairs to enter [employer] of Steps: 2 Entrance Stairs-Rails: Can reach both Home Layout: Two level;Able to live on main level with  bedroom/bathroom     Bathroom Shower/Tub: Teacher, early years/pre: Standard Bathroom Accessibility: Yes   Home Equipment: Cane - quad;Grab bars - tub/shower;Hand held shower head;Shower seat          Prior Functioning/Environment Prior Level of Function : Independent/Modified Independent             Mobility Comments: ambulates without DME inside the home, ambulates with support of spouse in the community ADLs Comments: Ind with ADLs/selfcare, family or friends assist with transportation        OT Problem List: Impaired balance (sitting and/or standing)      OT Treatment/Interventions:      OT Goals(Current goals can be found in the care plan section)    OT Frequency:      Co-evaluation              AM-PAC OT "6 Clicks" Daily Activity     Outcome Measure Help from another person eating meals?: None Help from another person taking care of personal grooming?: A Little Help from another person toileting, which includes using toliet, bedpan, or urinal?: A Little Help from another person bathing (including washing, rinsing, drying)?: A Little Help from another person to put on and taking off regular upper body clothing?: A Little Help from another person to put on and taking off regular lower body clothing?: A Little 6 Click Score: 19   End of Session Equipment Utilized During Treatment: Gait belt  Activity Tolerance: Patient tolerated treatment well Patient left: in bed  OT Visit Diagnosis: Unsteadiness on feet (R26.81)                Time: [id number] OT Time Calculation (min): 20 min Charges:  OT General Charges $OT Visit: 1 Visit    Britt Bottom 09/29/2022, 1:45 PM

## 2022-09-29 NOTE — Care Management Obs Status (Signed)
[place] NOTIFICATION   Patient Details  Name: Jessica [name] MRN: [id number] Date of Birth: 1950/10/07   Medicare Observation Status Notification Given:  Yes    Carles Collet, RN 09/29/2022, 3:11 PM

## 2022-09-29 NOTE — Progress Notes (Signed)
Discharge summary given, all questions answered.

## 2022-09-29 NOTE — Progress Notes (Signed)
Echocardiogram 2D Echocardiogram has been performed.  Ronny Flurry 09/29/2022, 11:07 AM

## 2022-10-06 DIAGNOSIS — I2510 Atherosclerotic heart disease of native coronary artery without angina pectoris: Secondary | ICD-10-CM | POA: Diagnosis not present

## 2022-10-06 DIAGNOSIS — E1129 Type 2 diabetes mellitus with other diabetic kidney complication: Secondary | ICD-10-CM | POA: Diagnosis not present

## 2022-10-06 DIAGNOSIS — Z0000 Encounter for general adult medical examination without abnormal findings: Secondary | ICD-10-CM | POA: Diagnosis not present

## 2022-10-06 DIAGNOSIS — I129 Hypertensive chronic kidney disease with stage 1 through stage 4 chronic kidney disease, or unspecified chronic kidney disease: Secondary | ICD-10-CM | POA: Diagnosis not present

## 2022-10-06 DIAGNOSIS — E785 Hyperlipidemia, unspecified: Secondary | ICD-10-CM | POA: Diagnosis not present

## 2022-10-06 DIAGNOSIS — D692 Other nonthrombocytopenic purpura: Secondary | ICD-10-CM | POA: Diagnosis not present

## 2022-10-06 DIAGNOSIS — Z23 Encounter for immunization: Secondary | ICD-10-CM | POA: Diagnosis not present

## 2022-10-06 DIAGNOSIS — I10 Essential (primary) hypertension: Secondary | ICD-10-CM | POA: Diagnosis not present

## 2022-10-06 DIAGNOSIS — I700 Atherosclerosis of aorta: Secondary | ICD-10-CM | POA: Diagnosis not present

## 2022-10-06 DIAGNOSIS — N184 Chronic kidney disease, stage 4 (severe): Secondary | ICD-10-CM | POA: Diagnosis not present

## 2022-10-14 ENCOUNTER — Other Ambulatory Visit (HOSPITAL_COMMUNITY): Payer: Self-pay | Admitting: *Deleted

## 2022-10-15 ENCOUNTER — Ambulatory Visit: Payer: Medicare HMO | Attending: Cardiovascular Disease | Admitting: Cardiovascular Disease

## 2022-10-15 ENCOUNTER — Ambulatory Visit (HOSPITAL_COMMUNITY)
Admission: RE | Admit: 2022-10-15 | Discharge: 2022-10-15 | Disposition: A | Discharge status: Home / Self Care | Payer: Medicare HMO | Source: Ambulatory Visit | Attending: Internal Medicine | Admitting: Internal Medicine

## 2022-10-15 ENCOUNTER — Encounter: Payer: Self-pay | Admitting: Cardiovascular Disease

## 2022-10-15 VITALS — BP 106/64 | HR 76 | Ht 60.0 in | Wt 116.2 lb

## 2022-10-15 DIAGNOSIS — E039 Hypothyroidism, unspecified: Secondary | ICD-10-CM

## 2022-10-15 DIAGNOSIS — E118 Type 2 diabetes mellitus with unspecified complications: Secondary | ICD-10-CM | POA: Diagnosis not present

## 2022-10-15 DIAGNOSIS — M810 Age-related osteoporosis without current pathological fracture: Secondary | ICD-10-CM | POA: Diagnosis not present

## 2022-10-15 DIAGNOSIS — E785 Hyperlipidemia, unspecified: Secondary | ICD-10-CM

## 2022-10-15 DIAGNOSIS — T148XXA Other injury of unspecified body region, initial encounter: Secondary | ICD-10-CM

## 2022-10-15 DIAGNOSIS — I2510 Atherosclerotic heart disease of native coronary artery without angina pectoris: Secondary | ICD-10-CM | POA: Diagnosis not present

## 2022-10-15 DIAGNOSIS — I952 Hypotension due to drugs: Secondary | ICD-10-CM | POA: Diagnosis not present

## 2022-10-15 DIAGNOSIS — T148XXD Other injury of unspecified body region, subsequent encounter: Secondary | ICD-10-CM

## 2022-10-15 MED ORDER — DENOSUMAB 60 MG/ML ~~LOC~~ SOSY: 60.0000 mg | PREFILLED_SYRINGE | Freq: Once | SUBCUTANEOUS | Status: AC

## 2022-10-15 MED ORDER — DENOSUMAB 60 MG/ML ~~LOC~~ SOSY
PREFILLED_SYRINGE | SUBCUTANEOUS | Status: AC
  Administered 2022-10-15: 60 mg via SUBCUTANEOUS
  Filled 2022-10-15: qty 1

## 2022-10-15 MED ORDER — METOPROLOL SUCCINATE ER 25 MG PO TB24: 37.5000 mg | ORAL_TABLET | Freq: Every day | ORAL | 3 refills | Status: DC

## 2022-10-15 NOTE — Progress Notes (Unsigned)
Cardiology Office Note    Date:  10/16/2022   ID:  Jessica [name], Jessica [name] 03/17/1951, MRN [id number]  PCP:  Ginger Organ., MD  Cardiologist:  Shelva Majestic, MD   12 -month F/U office visit  History of Present Illness:  Jessica [name] is a 72 y.o. female who suffered an acute coronary syndrome on December 30, 2019.  I last saw her on October 05, 2021.  She presents for 1 year follow-up evaluation.    Jessica [name]  has a history of hypertension and hyperlipidemia.  In early June 2021 she began to experience episodic shortness of breath.  On the morning of December 30, 2019 she experienced significant substernal chest pressure and tightness with radiation to her back.  She presented to [facility] emergency room where her ECG initially showed early inferior ST elevation.  A code STEMI was activated.  She was taken emergently to the catheterization laboratory by me and was found to have mild nonobstructive stenosis of 30% in the proximal LAD in the region of the first diagonal septal perforating artery.  She had a normal ramus intermediate and left circumflex vessel.  She had a dominant RCA with total mid distal occlusion of the PDA vessel.  Her vessel caliber was small and she underwent successful balloon angioplasty with restoration of TIMI-3 flow.  When evaluated by Jory Sims in July 2021 she continued to be on antiplatelet therapy with aspirin and Brilinta.  An initial echo Doppler study had shown an EF of 55 to 60% with grade 1 diastolic dysfunction with wall motion abnormality involving the inferior wall apical segment inferoseptal segment.  She was readmitted to the hospital on January 13, 2020 with associated chest and epigastric pain with radiation to her upper back and shoulder blades.  Amlodipine and Imdur were added to her medical regimen.  She subsequently developed hypotension and amlodipine dose was decreased to 5 mg and carvedilol to 6.25 mg twice a day.  I saw her in October 2021 for my initial in  office evaluation.  At that time he denied any recurrent chest pain off and felt like she had a yawn to take a deep breath.   She was on aspirin and ticagrelor for antiplatelet therapy.  She waas on isosorbide 15 mg and carvedilol 6.25 mg twice a day; however was only taking carvedilol on a daily basis. She is diabetic on Jardiance 10 mg and has hypothyroidism on levothyroxine 75 mcg. On rosuvastatin 20 mg LDL cholesterol was 47.  During my initial evaluation, since she was having difficulty taking the carvedilol twice a day I changed her to metoprolol succinate 12.5 mg daily.  She had been on levothyroxine for hypothyroidism and laboratory by her primary physician in June 2021 revealed her to be over suppressed.  She sees Dr. Lang Snow for her primary care.  She was evaluated in a telemedicine visit by Sande Rives, PA-C in November.  This evaluation was prompted by her prior emergency room visit where she presented with syncope.  Apparently, the patient was walking through her kitchen at a fast pace and accidentally struck her abdomen against the corner of the countertop.  He is legally blind.  An abdominal/pelvic CT showed a large abdominal wall hematoma secondary to her blunt trauma.  She required several units of packed red blood cells.  Aspirin and Brilinta were held.  Ultimately it was felt that she could discontinue Brilinta she had previously undergone balloon angioplasty not stenting due to her  all distal vessel.  When valuated by Sande Rives, her hemoglobin had increased.  Time she had noted some mild lightheadedness and dizziness.  I saw her on September 11, 2020.  She had Covid in December 20221 and underwent antibody infusion therapy.  Her symptoms included weakness, fatigue, cough and fever.  At her evaluation she admitted at times to the sensation of her heart racing.  She experienced a slight lump in her abdomen at the site of her recent blunt trauma from hitting the corner of her  countertop.  At the time of injury, she had developed a significant abdominal wall hematoma.  Her hemoglobin has been stable and she states most recently it was 13.2.  She denied recurrent anginal symptoms.  Lipid studies were excellent with an LDL at 29 in January 2022.  I last saw her on October 05, 2021 at which time she felt well and was not experiencing any chest pain.She is now seeing Dr. Osborne Casco of nephrology.  She is followed by Dr. Brigitte Pulse for primary care.  She has been without any chest pain or awareness of palpitations.  She has continued to be on isosorbide which is 15 mg, metoprolol succinate 50 mg.  She is diabetic on Jardiance in addition to Trulicity.  She is tolerating rosuvastatin 40 mg and fenofibrate for lipids and she remains on levothyroxine for hypothyroidism.  Most recent lipid studies in January 2023 showed an LDL cholesterol at 41 although triglycerides remain elevated at 224.   Since I last saw her, she was hospitalized from August 14 through March 10, 2022 with palpitations.  Her blood pressure had been elevated.  Chemistry was notable for creatinine 1.77 and slight elevation of transaminases.  Fenofibrate was discontinued during that hospitalization.  Presently, Jessica [name] has felt fairly well.  She is legally blind.  She had worn an event monitor from August 25 through April 17, 2022.  She was in sinus rhythm with as well as heart rate at 61 bpm and the fastest was sinus tachycardia.  There were very rare isolated PACs and PVCs.  There was no high-grade ectopy.  There were no episodes of atrial fibrillation, SVT, or pauses.  She was evaluated by Florence Canner, NP in January 2024 at that time she had run out of her Jardiance for approximately 3 to 4 weeks.  She had recently fallen and had developed a significant left buttock hematoma leading to an ER evaluation September 28, 2022 with discharge September 29, 2022.  She had experienced 2 falls.  The first fall seem to be mechanical after  tripping.  There was concern of possible syncope for the second fall.  There was concern that she may have been dehydrated from the recent urinary infection.  She was mildly hyponatremic with mild hyperkalemia.  There was a low suspicion for infection or seizure activity.  She underwent an echo Doppler study on September 28, 2022 which showed an EF of 60 to 65% with normal diastolic parameters and no wall motion abnormalities.  Valves were essentially normal.  She is here with her husband and presents for follow-up evaluation.   Past Medical History:  Diagnosis Date   Anemia    Anxiety    CKD (chronic kidney disease)    CKD III per PCP notes-pt denies   Congenital blindness    Diabetes mellitus    Diverticula of small intestine    Esophageal stricture    GERD (gastroesophageal reflux disease)    H/O hiatal hernia  Hyperlipidemia    Hypertension    Hypothyroidism    Iron deficiency anemia    Osteopenia    Osteoporosis    Postoperative nausea and vomiting    Retinitis    STEMI (ST elevation myocardial infarction) (HCC)    SVT (supraventricular tachycardia)    Vitamin B 12 deficiency     Past Surgical History:  Procedure Laterality Date   BACK SURGERY     CARPAL TUNNEL RELEASE     Right    CESAREAN SECTION     CHOLECYSTECTOMY  07/27/1983   COLONOSCOPY     CORONARY BALLOON ANGIOPLASTY N/A 12/30/2019   Procedure: CORONARY BALLOON ANGIOPLASTY;  Surgeon: Troy Sine, MD;  Location: [place] [facility];  Service: Cardiovascular;  Laterality: N/A;   CORONARY/GRAFT ACUTE MI REVASCULARIZATION N/A 12/30/2019   Procedure: Coronary/Graft Acute MI Revascularization;  Surgeon: Troy Sine, MD;  Location: [place] [facility];  Service: Cardiovascular;  Laterality: N/A;   HERNIA REPAIR     pt denies   LEFT HEART CATH AND CORONARY ANGIOGRAPHY N/A 12/30/2019   Procedure: LEFT HEART CATH AND CORONARY ANGIOGRAPHY;  Surgeon: Troy Sine, MD;  Location: [place] [facility];  Service:  Cardiovascular;  Laterality: N/A;   Right knee arthroscopy  07/27/1991   Right thyroidectomy  07/26/2005   TOTAL ABDOMINAL HYSTERECTOMY W/ BILATERAL SALPINGOOPHORECTOMY  07/27/1991   UPPER GASTROINTESTINAL ENDOSCOPY      Current Medications: Outpatient Medications Prior to Visit  Medication Sig Dispense Refill   acetaminophen (TYLENOL) 325 MG tablet Take 2 tablets (650 mg total) by mouth every 6 (six) hours as needed. 240 tablet 0   Coenzyme Q10 (CO Q 10 PO) Take 1 capsule by mouth daily.     cyanocobalamin (,VITAMIN B-12,) 1000 MCG/ML injection Inject 1,000 mcg into the muscle See admin instructions. 1000 mcg on the 10th of every month, every 30 days.     Dulaglutide (TRULICITY) 1.5 0000000 SOPN Inject 1.5 mg into the skin every Wednesday.     empagliflozin (JARDIANCE) 10 MG TABS tablet Take 1 tablet (10 mg total) by mouth daily. 30 tablet 6   famotidine (PEPCID) 40 MG tablet TAKE 1 TABLET TWICE DAILY 180 tablet 3   hydrALAZINE (APRESOLINE) 10 MG tablet Take 1 tablet (10 mg total) by mouth 3 (three) times daily as needed. hydralazine 10 mg as needed sbp greater than 160. Please  keep a blood pressure diary. (Patient taking differently: Take 10 mg by mouth 3 (three) times daily as needed (SBP > 160).) 90 tablet 3   levothyroxine (SYNTHROID) 50 MCG tablet Take 50 mcg by mouth daily before breakfast.     LORazepam (ATIVAN) 0.5 MG tablet Take 0.5 mg by mouth 2 (two) times daily as needed for anxiety.     magnesium oxide (MAG-OX) 400 MG tablet Take 400 mg by mouth daily.     nitroGLYCERIN (NITROSTAT) 0.4 MG SL tablet Place 1 tablet (0.4 mg total) under the tongue every 5 (five) minutes x 3 doses as needed for chest pain. 25 tablet 12   pantoprazole (PROTONIX) 40 MG tablet Take 40 mg by mouth 2 (two) times daily.     Probiotic Product (PROBIOTIC PO) Take 1 capsule by mouth 2 (two) times daily.     rosuvastatin (CRESTOR) 40 MG tablet Take 1 tablet (40 mg total) by mouth daily. (Patient taking  differently: Take 40 mg by mouth at bedtime.) 90 tablet 3   sertraline (ZOLOFT) 50 MG tablet Take 50 mg by mouth  at bedtime.      metoprolol succinate (TOPROL-XL) 50 MG 24 hr tablet Take 50 mg by mouth daily.     No facility-administered medications prior to visit.     Allergies:   Codeine, Erythromycin, Levaquin [levofloxacin], Asa [aspirin], Motrin [ibuprofen], and Ultram [tramadol]   Social History   Socioeconomic History   Marital status: Married    Spouse name: Not on file   Number of children: 4   Years of education: Not on file   Highest education level: Not on file  Occupational History   Occupation: house wife    Occupation: retired    Fish farm manager: [employer]  Tobacco Use   Smoking status: Never   Smokeless tobacco: Never  Vaping Use   Vaping Use: Never used  Substance and Sexual Activity   Alcohol use: No   Drug use: No   Sexual activity: Not Currently  Other Topics Concern   Not on file  Social History Narrative   ** Merged History Encounter **       Social Determinants of Health   Financial Resource Strain: Not on file  Food Insecurity: Not on file  Transportation Needs: Not on file  Physical Activity: Not on file  Stress: Not on file  Social Connections: Not on file     Family History:  The patient's family history includes COPD in her father; CVA in her mother; Coronary artery disease in her father; Diabetes Mellitus II in her maternal grandmother and mother; Heart disease in her mother; Hyperlipidemia in her sister; Hypertension in her father, mother, sister, and sister; Uterine cancer in her sister.   ROS General: Negative; No fevers, chills, or night sweats;  HEENT: Legally blind, no changes in hearing, sinus congestion, difficulty swallowing Pulmonary: Negative; No cough, wheezing, shortness of breath, hemoptysis Cardiovascular: See HPI GI: Negative; No nausea, vomiting, diarrhea, or abdominal pain GU: Negative; No dysuria, hematuria,  or difficulty voiding Musculoskeletal: Negative; no myalgias, joint pain, or weakness Hematologic/Oncology: Recent abdominal wall hematoma secondary to blunt trauma while on aspirin/Brilinta. Endocrine: Negative; no heat/cold intolerance; no diabetes Neuro: Negative; no changes in balance, headaches Skin: Negative; No rashes or skin lesions Psychiatric: Negative; No behavioral problems, depression Sleep: Negative; No snoring, daytime sleepiness, hypersomnolence, bruxism, restless legs, hypnogognic hallucinations, no cataplexy Other comprehensive 14 point system review is negative.   PHYSICAL EXAM:   VS:  BP 106/64 (BP Location: Right Arm, Patient Position: Sitting, Cuff Size: Normal)   Pulse 76   Ht 5' (1.524 m)   Wt 116 lb 3.2 oz (52.7 kg)   SpO2 96%   BMI 22.69 kg/m     Repeat blood pressure by me was reduced at 92/60.  Wt Readings from Last 3 Encounters:  10/15/22 116 lb 3.2 oz (52.7 kg)  09/28/22 111 lb (50.3 kg)  08/02/22 114 lb (51.7 kg)    General: Alert, oriented, no distress.  Skin: normal turgor, no rashes, warm and dry HEENT: Normocephalic, atraumatic.  Legally blind Nose without nasal septal hypertrophy Mouth/Parynx benign; Mallinpatti scale 2 Neck: No JVD, no carotid bruits; normal carotid upstroke Lungs: clear to ausculatation and percussion; no wheezing or rales Chest wall: without tenderness to palpitation Heart: PMI not displaced, RRR, s1 s2 normal, 1/6 systolic murmur, no diastolic murmur, no rubs, gallops, thrills, or heaves Abdomen: soft, nontender; no hepatosplenomehaly, BS+; abdominal aorta nontender and not dilated by palpation. Back: no CVA tenderness; left buttock hematoma Pulses 2+ Musculoskeletal: full range of motion, normal strength, no joint  deformities Extremities: no clubbing cyanosis or edema, Homan's sign negative  Neurologic: grossly nonfocal; Cranial nerves grossly wnl Psychologic: Normal mood and affect      Studies/Labs Reviewed:    October 15, 2022 ECG (independently read by me): NSR at 76, no ectopy  October 05, 2021 ECG (independently read by me): NSR at 64; no ST changes, no ectopr   September 11, 2020 ECG (independently read by me): NSR at 70, no ectopy  October 2021 ECG (independently read by me): NSR at 64, no ST changes, no ectopy, normal intervals  Recent Labs:    Latest Ref Rng & Units 09/29/2022    3:14 AM 09/28/2022    6:09 PM 09/28/2022    3:05 AM  BMP  Glucose 70 - 99 mg/dL 139  149  200   BUN 8 - 23 mg/dL 15  17  17    Creatinine 0.44 - 1.00 mg/dL 1.90  2.02  1.98   Sodium 135 - 145 mmol/L 138  135  129   Potassium 3.5 - 5.1 mmol/L 4.8  4.7  5.3   Chloride 98 - 111 mmol/L 109  107  100   CO2 22 - 32 mmol/L 21  22  22    Calcium 8.9 - 10.3 mg/dL 8.5  8.5  9.1         Latest Ref Rng & Units 09/28/2022    6:09 PM 09/28/2022    3:05 AM 03/09/2022    6:35 AM  Hepatic Function  Total Protein 6.5 - 8.1 g/dL  6.5  6.6   Albumin 3.5 - 5.0 g/dL 3.2  3.8  3.7   AST 15 - 41 U/L  34  39   ALT 0 - 44 U/L  33  45   Alk Phosphatase 38 - 126 U/L  28  39   Total Bilirubin 0.3 - 1.2 mg/dL  0.5  0.4        Latest Ref Rng & Units 09/29/2022    3:14 AM 09/28/2022    6:09 PM 09/28/2022    3:05 AM  CBC  WBC 4.0 - 10.5 K/uL 6.9  9.7  19.6   Hemoglobin 12.0 - 15.0 g/dL 9.1  9.0  11.2   Hematocrit 36.0 - 46.0 % 27.9  26.4  34.9   Platelets 150 - 400 K/uL 231  232  294    Lab Results  Component Value Date   MCV 94.9 09/29/2022   MCV 93.0 09/28/2022   MCV 95.4 09/28/2022   Lab Results  Component Value Date   TSH 6.543 (H) 09/28/2022   Lab Results  Component Value Date   HGBA1C 7.7 (H) 09/28/2022     BNP    Component Value Date/Time   BNP 15.6 09/21/2017 2244    ProBNP No results found for: "PROBNP"   Lipid Panel     Component Value Date/Time   CHOL 116 12/31/2019 0325   TRIG 216 (H) 12/31/2019 0325   HDL 26 (L) 12/31/2019 0325   CHOLHDL 4.5 12/31/2019 0325   VLDL 43 (H) 12/31/2019 0325    LDLCALC 47 12/31/2019 0325     RADIOLOGY: ECHOCARDIOGRAM COMPLETE  Result Date: 09/29/2022    ECHOCARDIOGRAM REPORT   Patient Name:   CLINTON PLEAS Date of Exam: 09/29/2022 Medical Rec #:  [id number]     Height:       61.0 in Accession #:    [id number]    Weight:       111.0 lb Date of  Birth:  06-05-1951    BSA:          1.471 m Patient Age:    42 years      BP:           117/67 mmHg Patient Gender: F             HR:           82 bpm. Exam Location:  Inpatient Procedure: 2D Echo, Cardiac Doppler and Color Doppler Indications:    Syncope R55  History:        Patient has prior history of Echocardiogram examinations, most                 recent 12/31/2019. CAD and Previous Myocardial Infarction; Risk                 Factors:Hypertension, Dyslipidemia and Diabetes. CKD, stage 4.  Sonographer:    Ronny Flurry Referring Phys: [place]  1. Left ventricular ejection fraction, by estimation, is 60 to 65%. The left ventricle has normal function. The left ventricle has no regional wall motion abnormalities. Left ventricular diastolic parameters were normal.  2. Right ventricular systolic function is normal. The right ventricular size is normal.  3. The mitral valve is normal in structure. No evidence of mitral valve regurgitation. No evidence of mitral stenosis.  4. The aortic valve is normal in structure. Aortic valve regurgitation is not visualized. No aortic stenosis is present.  5. The inferior vena cava is normal in size with greater than 50% respiratory variability, suggesting right atrial pressure of 3 mmHg. FINDINGS  Left Ventricle: Left ventricular ejection fraction, by estimation, is 60 to 65%. The left ventricle has normal function. The left ventricle has no regional wall motion abnormalities. The left ventricular internal cavity size was normal in size. There is  no left ventricular hypertrophy. Left ventricular diastolic parameters were normal. Right Ventricle: The right ventricular  size is normal. No increase in right ventricular wall thickness. Right ventricular systolic function is normal. Left Atrium: Left atrial size was normal in size. Right Atrium: Right atrial size was normal in size. Pericardium: There is no evidence of pericardial effusion. Mitral Valve: The mitral valve is normal in structure. No evidence of mitral valve regurgitation. No evidence of mitral valve stenosis. Tricuspid Valve: The tricuspid valve is normal in structure. Tricuspid valve regurgitation is not demonstrated. No evidence of tricuspid stenosis. Aortic Valve: The aortic valve is normal in structure. Aortic valve regurgitation is not visualized. No aortic stenosis is present. Aortic valve mean gradient measures 3.0 mmHg. Aortic valve peak gradient measures 5.1 mmHg. Aortic valve area, by VTI measures 1.85 cm. Pulmonic Valve: The pulmonic valve was normal in structure. Pulmonic valve regurgitation is not visualized. No evidence of pulmonic stenosis. Aorta: The aortic root is normal in size and structure. Venous: The inferior vena cava is normal in size with greater than 50% respiratory variability, suggesting right atrial pressure of 3 mmHg. IAS/Shunts: No atrial level shunt detected by color flow Doppler.  LEFT VENTRICLE PLAX 2D LVIDd:         4.10 cm   Diastology LVIDs:         2.80 cm   LV e' medial:    6.53 cm/s LV PW:         0.70 cm   LV E/e' medial:  9.6 LV IVS:        0.90 cm   LV e' lateral:   9.14  cm/s LVOT diam:     2.00 cm   LV E/e' lateral: 6.8 LV SV:         41 LV SV Index:   28 LVOT Area:     3.14 cm  RIGHT VENTRICLE            IVC RV S prime:     7.72 cm/s  IVC diam: 0.80 cm TAPSE (M-mode): 1.8 cm LEFT ATRIUM             Index        RIGHT ATRIUM          Index LA diam:        2.80 cm 1.90 cm/m   RA Area:     6.64 cm LA Vol (A2C):   20.1 ml 13.67 ml/m  RA Volume:   8.61 ml  5.85 ml/m LA Vol (A4C):   22.3 ml 15.16 ml/m LA Biplane Vol: 22.8 ml 15.50 ml/m  AORTIC VALVE AV Area (Vmax):    1.91  cm AV Area (Vmean):   1.87 cm AV Area (VTI):     1.85 cm AV Vmax:           113.00 cm/s AV Vmean:          75.300 cm/s AV VTI:            0.219 m AV Peak Grad:      5.1 mmHg AV Mean Grad:      3.0 mmHg LVOT Vmax:         68.65 cm/s LVOT Vmean:        44.750 cm/s LVOT VTI:          0.129 m LVOT/AV VTI ratio: 0.59  AORTA Ao Root diam: 2.90 cm Ao Asc diam:  2.80 cm MITRAL VALVE MV Area (PHT): 3.48 cm    SHUNTS MV Decel Time: 218 msec    Systemic VTI:  0.13 m MV E velocity: 62.40 cm/s  Systemic Diam: 2.00 cm MV A velocity: 94.10 cm/s MV E/A ratio:  0.66 Aditya Sabharwal Electronically signed by Hebert Soho Signature Date/Time: 09/29/2022/1:40:00 PM    Final    MR BRAIN WO CONTRAST  Result Date: 09/28/2022 CLINICAL DATA:  Syncope/presyncope, cerebrovascular cause suspected. EXAM: MRI HEAD WITHOUT CONTRAST TECHNIQUE: Multiplanar, multiecho pulse sequences of the brain and surrounding structures were obtained without intravenous contrast. COMPARISON:  Head CT 09/28/2022.  MRI brain 03/02/2021. FINDINGS: Brain: No acute infarct or intracranial hemorrhage. Unchanged encephalomalacia along the right postcentral gyrus and left medial occipital lobes. 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 Single focus of microhemorrhage along the right frontal operculum. No hydrocephalus or extra-axial collection. No mass or midline shift. Vascular: Normal flow voids. Skull and upper cervical spine: Normal marrow signal. Sinuses/Orbits: Unremarkable. Other: Left parietal scalp contusion. IMPRESSION: 1. No acute intracranial abnormality or mass. 2. 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 Electronically Signed   By: Emmit Alexanders M.D.   On: 09/28/2022 11:24   CT ABDOMEN PELVIS WO CONTRAST  Result Date: 09/28/2022 CLINICAL DATA:  Fall injury with left lower quadrant pain and impact to the buttocks. EXAM: CT ABDOMEN AND  PELVIS WITHOUT CONTRAST TECHNIQUE: Multidetector CT imaging of the abdomen and pelvis was performed following the standard protocol without IV contrast. RADIATION DOSE REDUCTION: This exam was performed according to the departmental dose-optimization program which includes automated exposure control, adjustment of the mA  and/or kV according to patient size and/or use of iterative reconstruction technique. COMPARISON:  Chest CT without contrast 06/30/2021 and 08/01/2019, CT abdomen and pelvis with oral contrast only dated 04/18/2006. FINDINGS: Lower chest: Again noted is a 1.5 x 1.2 cm rounded lingular noncalcified nodule. This is unchanged dating back to the 2021 chest CT. In 2007 this measured 1 x 0.8 cm but a benign entity is still favored given 3 years stability since 2021. The lung bases are otherwise clear. There is a small hiatal hernia. The cardiac size is normal. Hepatobiliary: The liver is unremarkable without contrast. Gallbladder is absent with chronic postcholecystectomy common bile duct dilatation to 11 mm. Pancreas: Unremarkable without contrast. Spleen: Unremarkable without contrast. No splenic injury or perisplenic hematoma. Adrenals/Urinary Tract: No adrenal or renal hemorrhage or hematoma. Both kidneys are small in length, right measuring 7.4 cm length, left is 8 cm with bilateral mild generalized cortical volume loss. There is no contour deforming mass of either unenhanced kidney. There is no urinary stone or obstruction. There is no bladder thickening. Stomach/Bowel: Small hiatal hernia. Otherwise unremarkable stomach. There is no small bowel dilatation or inflammation. The appendix is normal. There is diverticulosis along the colon without evidence of colitis or focal diverticulitis. Vascular/Lymphatic: Aortic atherosclerosis. No enlarged abdominal or pelvic lymph nodes. Reproductive: Status post hysterectomy. No adnexal masses. Other: Numerous pelvic phleboliths. There are bilateral inguinal  fat hernias. There is no incarcerated hernia. There is free air, free hemorrhage or free ascites. Musculoskeletal: There is a subcutaneous hematoma in the lower left buttock measuring 5.9 x 4 x 11 cm. There is surrounding moderate stranding in the left buttock almost certainly posttraumatic although infectious process could appear similar. No gluteal intramuscular hematoma is seen. There is mild chronic compression fracture of T12 and L2, and L2 with again noted kyphoplasty cement. No regional acute skeletal fracture is seen. There is osteopenia and degenerative change of the lumbar spine. IMPRESSION: 1. 5.9 x 4 x 11 cm subcutaneous hematoma in the lower left buttock with moderate surrounding stranding. 2. No other acute trauma related findings are seen in the abdomen or pelvis without contrast. 3. 1.5 x 1.2 cm lingular nodule, unchanged dating back to the 2021 chest CT. In 2007 this measured 1 x 0.8 cm, but a benign entity is still favored given 3 years stability since 2021. 4. Small hiatal hernia. 5. Diverticulosis without evidence of diverticulitis. 6. Aortic atherosclerosis. 7. Bilateral inguinal fat hernias. 8. Osteopenia and degenerative change. Chronic compression fractures of T12, L2. Aortic Atherosclerosis (ICD10-I70.0). Electronically Signed   By: Telford Nab M.D.   On: 09/28/2022 07:26   CT Cervical Spine Wo Contrast  Result Date: 09/28/2022 CLINICAL DATA:  Neck trauma (Age >= 65y).  Fall. EXAM: CT CERVICAL SPINE WITHOUT CONTRAST TECHNIQUE: Multidetector CT imaging of the cervical spine was performed without intravenous contrast. Multiplanar CT image reconstructions were also generated. RADIATION DOSE REDUCTION: This exam was performed according to the departmental dose-optimization program which includes automated exposure control, adjustment of the mA and/or kV according to patient size and/or use of iterative reconstruction technique. COMPARISON:  None Available. FINDINGS: Alignment: No  subluxation Skull base and vertebrae: No acute fracture. No primary bone lesion or focal pathologic process. Soft tissues and spinal canal: No prevertebral fluid or swelling. No visible canal hematoma. Disc levels: Moderate diffuse degenerative disc disease. Advanced bilateral degenerative facet disease, left greater than right. Upper chest: No acute findings Other: None IMPRESSION: Degenerative disc and facet disease. No acute bony abnormality.  Electronically Signed   By: Rolm Baptise M.D.   On: 09/28/2022 03:41   CT Head Wo Contrast  Result Date: 09/28/2022 CLINICAL DATA:  Fall EXAM: CT HEAD WITHOUT CONTRAST TECHNIQUE: Contiguous axial images were obtained from the base of the skull through the vertex without intravenous contrast. RADIATION DOSE REDUCTION: This exam was performed according to the departmental dose-optimization program which includes automated exposure control, adjustment of the mA and/or kV according to patient size and/or use of iterative reconstruction technique. COMPARISON:  03/01/2021 FINDINGS: Brain: Prior left occipital infarct again noted, prior infarcts in the left occipital lobe and posterior right parietal lobe, stable. There is atrophy and chronic small vessel disease changes. No acute intracranial abnormality. Specifically, no hemorrhage, hydrocephalus, mass lesion, acute infarction, or significant intracranial injury. Vascular: No hyperdense vessel or unexpected calcification. Skull: No acute calvarial abnormality. 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 Atrophy, chronic microvascular disease. No acute intracranial abnormality. Electronically Signed   By: Rolm Baptise M.D.   On: 09/28/2022 03:39   DG Humerus Left  Result Date: 09/28/2022 CLINICAL DATA:  Recent fall with left arm pain, initial encounter EXAM: LEFT HUMERUS - 2+ VIEW COMPARISON:  None Available. FINDINGS: There is no evidence of fracture or other  focal bone lesions. Soft tissues are unremarkable. IMPRESSION: No acute abnormality noted. Electronically Signed   By: Inez Catalina M.D.   On: 09/28/2022 02:59   DG Chest Portable 1 View  Result Date: 09/28/2022 CLINICAL DATA:  Fall EXAM: PORTABLE CHEST 1 VIEW COMPARISON:  03/08/2022 FINDINGS: The heart size and mediastinal contours are within normal limits. Both lungs are clear. The visualized skeletal structures are unremarkable. IMPRESSION: No active disease. Electronically Signed   By: Rolm Baptise M.D.   On: 09/28/2022 02:49   DG Hip Unilat With Pelvis 2-3 Views Left  Result Date: 09/28/2022 CLINICAL DATA:  Fall EXAM: DG HIP (WITH OR WITHOUT PELVIS) 2-3V LEFT COMPARISON:  Right hip series and pelvis today. FINDINGS: No acute bony abnormality. Specifically, no fracture, subluxation, or dislocation. Vascular calcifications and phleboliths in the pelvis. IMPRESSION: No acute bony abnormality. Electronically Signed   By: Rolm Baptise M.D.   On: 09/28/2022 02:33   DG Hip Unilat  With Pelvis 2-3 Views Right  Result Date: 09/28/2022 CLINICAL DATA:  Fall EXAM: DG HIP (WITH OR WITHOUT PELVIS) 2-3V RIGHT COMPARISON:  None Available. FINDINGS: There is no evidence of hip fracture or dislocation. There is no evidence of arthropathy or other focal bone abnormality. Vascular calcifications noted and multiple phleboliths in the pelvis. IMPRESSION: No acute bony abnormality. Electronically Signed   By: Rolm Baptise M.D.   On: 09/28/2022 02:33     Additional studies/ records that were reviewed today include:  Prox LAD lesion is 30% stenosed. 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 Post intervention, there is a 5% residual stenosis.   Mild nonobstructive stenosis of 30% in the proximal LAD in the region of the first diagonal and septal perforating artery.   Normal ramus intermediate and left circumflex coronary arteries.   Dominant RCA with total mid distal occlusion of the PDA vessel.   LVEDP 22 mmHg.    RECOMMENDATION: DAPT initially but since no stenting was performed duration may be less than 1 year.  Will DC bivalirudin several hours post procedure.  Hydrate.  Aggressive lipid-lowering therapy with target LDL less than 70 and optimal blood pressure control.    Intervention      ECHO: 09/28/2022  1. Left ventricular ejection fraction, by estimation, is 60 to 65%. The  left ventricle has normal function. The left ventricle has no regional  wall motion abnormalities. Left ventricular diastolic parameters were  normal.   2. Right ventricular systolic function is normal. The right ventricular  size is normal.   3. The mitral valve is normal in structure. No evidence of mitral valve  regurgitation. No evidence of mitral stenosis.   4. The aortic valve is normal in structure. Aortic valve regurgitation is  not visualized. No aortic stenosis is present.   5. The inferior vena cava is normal in size with greater than 50%  respiratory variability, suggesting right atrial pressure of 3 mmHg.    ASSESSMENT:    1. Hypotension due to drugs   2. Coronary artery disease involving native coronary artery of native heart without angina pectoris   3. Type 2 diabetes mellitus with complication, without long-term current use of insulin ([place])   4. Hyperlipidemia LDL goal <70   5. Hypothyroidism, unspecified type   6. Hematoma     PLAN:  Jessica [name] is a 72 year-old female who developed an acute coronary syndrome on December 30, 2019 and was found to have total mid distal occlusion of the PDA branch of her right coronary artery.  Due to the vessel caliber, stenting was not done but she underwent successful PTCA.  When I saw her for my initial evaluation with me in October 2021 sh e was continuing to feel well on a regimen consisting of  isosorbide at 15 mg in addition to carvedilol which is supposed to be 6.25 mg twice a day but apparently she has only been taking often as a daily dose.  During that  evaluation I recommended changing carvedilol to metoprolol succinate and started her at low-dose of 12.5 mg.  She was doing well on aspirin/Brilinta until she had hard blunt trauma to her lower left abdominal wall when she hit a countertop.  This resulted in development of a significant abdominal wall hematoma and she required 2 units of packed red blood cell transfusion.  When seen by me in February 2022 her hemoglobin was stable and recommended she stay off Brilinta since she had not been stented.  Over the past year, she has done well and denies any anginal symptomatology.  Her blood pressure on repeat by me today was 124/78 which was stable on her regimen consisting of metoprolol succinate 50 mg, isosorbide 15 mg daily.  She has CKD and creatinine in January 2023 was 1.86.  She is followed by Dr. Osborne Casco of nephrology.  Lipid studies in January 2023 were excellent with an LDL cholesterol at 41, however triglycerides remain slightly elevated at 224 and she has low HDL cholesterol at 33.  She had been on fenofibrate, but this had been discontinued after an ER evaluation.  She recently had fallen leading to ER evaluation on September 28, 2022 where she was found to have significant left buttock hematoma..  An echo Doppler study showed normal LV function with EF 60 to 65% with normal diastolic parameters and no wall motion abnormalities.  Valves were normal.  Presently, she feels well but continues to be sore in the region of the hematoma.  Her blood pressure is low today when reassessed by me.  Rhythm is stable.  Her monitor did not show any evidence of SVT PAF or pauses.  With her low blood pressure I am suggesting deep creasing metoprolol succinate from 50 mg  down to 37.5 mg daily.  Although she has a prescription for hydralazine she has not taken this in some time.  She continues to be on rosuvastatin 40 mg for hyperlipidemia.  She is on levothyroxine 50 mcg for hypothyroidism.  She is now back on Jardiance and  Trulicity for her diabetes mellitus.  She sees Dr. Brigitte Pulse for primary care and will be seeing Dr. Henrene Pastor for GI evaluation in the near future.  I recommended she have follow-up evaluation with Diona Browner, NP in our office in 4 months and I will see her in 8 to 9 months for follow-up evaluation.    Medication Adjustments/Labs and Tests Ordered: Current medicines are reviewed at length with the patient today.  Concerns regarding medicines are outlined above.  Medication changes, Labs and Tests ordered today are listed in the Patient Instructions below. Patient Instructions  Medication Instructions:  DECREASE metoprolol succinate (Toprol XL) to 37.5 mg daily  *If you need a refill on your cardiac medications before your next appointment, please call your pharmacy*  Follow-Up: At [facility], you and your health needs are our priority.  As part of our continuing mission to provide you with exceptional heart care, we have created designated Provider Care Teams.  These Care Teams include your primary Cardiologist (physician) and Advanced Practice Providers (APPs -  Physician Assistants and Nurse Practitioners) who all work together to provide you with the care you need, when you need it.  We recommend signing up for the patient portal called "MyChart".  Sign up information is provided on this After Visit Summary.  MyChart is used to connect with patients for Virtual Visits (Telemedicine).  Patients are able to view lab/test results, encounter notes, upcoming appointments, etc.  Non-urgent messages can be sent to your provider as well.   To learn more about what you can do with MyChart, go to [email or web address].    Your next appointment:   4 month(s) with Diona Browner NP and 8-9 months with Dr. Claiborne Billings       Signed, Shelva Majestic, MD  10/16/2022 12:17 PM    [facility] [address], [place], [place], [place]  [address] Phone: [phone]

## 2022-10-15 NOTE — Patient Instructions (Signed)
Medication Instructions:  DECREASE metoprolol succinate (Toprol XL) to 37.5 mg daily  *If you need a refill on your cardiac medications before your next appointment, please call your pharmacy*  Follow-Up: At [facility], you and your health needs are our priority.  As part of our continuing mission to provide you with exceptional heart care, we have created designated Provider Care Teams.  These Care Teams include your primary Cardiologist (physician) and Advanced Practice Providers (APPs -  Physician Assistants and Nurse Practitioners) who all work together to provide you with the care you need, when you need it.  We recommend signing up for the patient portal called "MyChart".  Sign up information is provided on this After Visit Summary.  MyChart is used to connect with patients for Virtual Visits (Telemedicine).  Patients are able to view lab/test results, encounter notes, upcoming appointments, etc.  Non-urgent messages can be sent to your provider as well.   To learn more about what you can do with MyChart, go to [email or web address].    Your next appointment:   4 month(s) with Diona Browner NP and 8-9 months with Dr. Claiborne Billings

## 2022-10-16 ENCOUNTER — Encounter: Payer: Self-pay | Admitting: Cardiovascular Disease

## 2022-10-21 ENCOUNTER — Encounter: Payer: Self-pay | Admitting: Internal Medicine

## 2022-10-21 ENCOUNTER — Ambulatory Visit: Payer: Medicare HMO | Admitting: Internal Medicine

## 2022-10-21 VITALS — BP 124/78 | HR 73 | Ht 60.0 in | Wt 115.0 lb

## 2022-10-21 DIAGNOSIS — R1310 Dysphagia, unspecified: Secondary | ICD-10-CM | POA: Diagnosis not present

## 2022-10-21 NOTE — Patient Instructions (Signed)
_______________________________________________________  If your blood pressure at your visit was 140/90 or greater, please contact your primary care physician to follow up on this.  _______________________________________________________  If you are age 74 or older, your body mass index should be between 23-30. Your Body mass index is 22.46 kg/m. If this is out of the aforementioned range listed, please consider follow up with your Primary Care Provider.  If you are age 59 or younger, your body mass index should be between 19-25. Your Body mass index is 22.46 kg/m. If this is out of the aformentioned range listed, please consider follow up with your Primary Care Provider.   ________________________________________________________  The  GI providers would like to encourage you to use [facility] to communicate with providers for non-urgent requests or questions.  Due to long hold times on the telephone, sending your provider a message by [facility] may be a faster and more efficient way to get a response.  Please allow 48 business hours for a response.  Please remember that this is for non-urgent requests.  _______________________________________________________ Dennis Bast have been scheduled for a Barium Esophogram at [facility] (1st floor of the hospital) on 11/05/2022 at 10:00am. Please arrive 15 minutes prior to your appointment for registration. Make certain not to have anything to eat or drink 3 hours prior to your test. If you need to reschedule for any reason, please contact radiology at [phone] to do so. __________________________________________________________________ A barium swallow is an examination that concentrates on views of the esophagus. This tends to be a double contrast exam (barium and two liquids which, when combined, create a gas to distend the wall of the oesophagus) or single contrast (non-ionic iodine based). The study is usually tailored to your symptoms so a  good history is essential. Attention is paid during the study to the form, structure and configuration of the esophagus, looking for functional disorders (such as aspiration, dysphagia, achalasia, motility and reflux) EXAMINATION You may be asked to change into a gown, depending on the type of swallow being performed. A radiologist and radiographer will perform the procedure. The radiologist will advise you of the type of contrast selected for your procedure and direct you during the exam. You will be asked to stand, sit or lie in several different positions and to hold a small amount of fluid in your mouth before being asked to swallow while the imaging is performed .In some instances you may be asked to swallow barium coated marshmallows to assess the motility of a solid food bolus. The exam can be recorded as a digital or video fluoroscopy procedure. POST PROCEDURE It will take 1-2 days for the barium to pass through your system. To facilitate this, it is important, unless otherwise directed, to increase your fluids for the next 24-48hrs and to resume your normal diet.  This test typically takes about 30 minutes to perform. __________________________________________________________________________________

## 2022-10-21 NOTE — Progress Notes (Signed)
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  Patient has had problems with intermittent solid food dysphagia for many years.  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  She denies any significant problems with liquids or pills.  She did undergo upper endoscopy with esophageal dilation of a distal stricture via balloon November 2022.  She had her husband, who accompanies her today, feels this may have helped for a few months.  She returned to our office May 26, 2021 regarding ongoing problems with dysphagia for which she was seen by our GI physician assistant.  Upper endoscopy was scheduled and performed August 02, 2022.  Of note, the esophagus was tortuous but otherwise normal.  There was no cricopharyngeal resistance.  There was a benign-appearing stenosis at 31 cm from the incisors which was dilated up to 20 mm via balloon.  She did have a hiatal hernia which was simple.  No other abnormalities.  She contacted the office recently with ongoing complaints of dysphagia.  Presents today for further evaluation.  REVIEW OF SYSTEMS:  All non-GI ROS negative unless otherwise stated in the HPI except for fatigue, irregular heartbeat  Past Medical History:  Diagnosis Date   Anemia    Anxiety    CKD (chronic kidney disease)    CKD III per PCP notes-pt denies   Congenital blindness    Diabetes mellitus    Diverticula of small intestine    Esophageal stricture    GERD (gastroesophageal reflux disease)    H/O hiatal hernia    Hyperlipidemia    Hypertension    Hypothyroidism    Iron deficiency anemia    Osteopenia    Osteoporosis    Postoperative nausea and vomiting    Retinitis    STEMI (ST elevation myocardial infarction) ([place])    SVT  (supraventricular tachycardia)    Vitamin B 12 deficiency     Past Surgical History:  Procedure Laterality Date   BACK SURGERY     CARPAL TUNNEL RELEASE     Right    CESAREAN SECTION     CHOLECYSTECTOMY  07/27/1983   COLONOSCOPY     CORONARY BALLOON ANGIOPLASTY N/A 12/30/2019   Procedure: CORONARY BALLOON ANGIOPLASTY;  Surgeon: Troy Sine, MD;  Location: [place] [facility];  Service: Cardiovascular;  Laterality: N/A;   CORONARY/GRAFT ACUTE MI REVASCULARIZATION N/A 12/30/2019   Procedure: Coronary/Graft Acute MI Revascularization;  Surgeon: Troy Sine, MD;  Location: [place] [facility];  Service: Cardiovascular;  Laterality: N/A;   HERNIA REPAIR     pt denies   LEFT HEART CATH AND CORONARY ANGIOGRAPHY N/A 12/30/2019   Procedure: LEFT HEART CATH AND CORONARY ANGIOGRAPHY;  Surgeon: Troy Sine, MD;  Location: [place] [facility];  Service: Cardiovascular;  Laterality: N/A;   Right knee arthroscopy  07/27/1991   Right thyroidectomy  07/26/2005   TOTAL ABDOMINAL HYSTERECTOMY W/ BILATERAL SALPINGOOPHORECTOMY  07/27/1991   UPPER GASTROINTESTINAL ENDOSCOPY      Social History Jessica [name]  reports that she has never smoked. She has never used smokeless tobacco. She reports that she does not drink alcohol and does not use drugs.  family history includes COPD in her father; CVA in her mother; Coronary artery disease in  her father; Diabetes Mellitus II in her maternal grandmother and mother; Heart disease in her mother; Hyperlipidemia in her sister; Hypertension in her father, mother, sister, and sister; Uterine cancer in her sister.  Allergies  Allergen Reactions   Codeine Shortness Of Breath and Nausea And Vomiting   Erythromycin Nausea And Vomiting   Levaquin [Levofloxacin] Shortness Of Breath   Asa [Aspirin] Other (See Comments)    Pt told to avoid aspirin due to her heart.   Motrin [Ibuprofen] Other (See Comments)    Pt told to avoid ibuprofen due to her heart.    Ultram [Tramadol] Nausea And Vomiting       PHYSICAL EXAMINATION: Vital signs: BP 124/78   Pulse 73   Wt 115 lb (52.2 kg)   BMI 22.46 kg/m   Constitutional: Thin, generally well-appearing, no acute distress Psychiatric: alert and oriented x3, cooperative Mouth: oral pharynx moist, no lesions Neck: supple no lymphadenopathy Cardiovascular: heart regular rate and rhythm Lungs: clear to auscultation bilaterally Abdomen: Reexamined extremities: no lower extremity edema bilaterally Skin: no lesions on visible extremities Neuro: No focal deficits.   ASSESSMENT:  1.  Ongoing problems with intermittent solid food dysphagia in the proximal esophagus/cricopharyngeal/posterior pharyngeal region as described.  With the history as described, the endoscopic findings in mind, and lack of response to dilation of distal stricture, possible etiologies include Zenker's not appreciated, proximal Web or bar, or extrinsic process.   PLAN:  1.  Barium swallow with tablet.  Further recommendations thereafter. A total time of 30 minutes was spent preparing to see the patient, obtaining interval history, performing medically appropriate physical examination, counseling and educating the patient and her husband regarding the above listed issues, ordering advanced radiology study, and documenting clinical information in the health record

## 2022-11-05 ENCOUNTER — Ambulatory Visit (HOSPITAL_COMMUNITY): Payer: Medicare HMO

## 2022-11-09 ENCOUNTER — Ambulatory Visit (HOSPITAL_COMMUNITY)
Admission: RE | Admit: 2022-11-09 | Discharge: 2022-11-09 | Disposition: A | Discharge status: Home / Self Care | Payer: Medicare HMO | Source: Ambulatory Visit | Attending: Internal Medicine | Admitting: Internal Medicine

## 2022-11-09 DIAGNOSIS — K219 Gastro-esophageal reflux disease without esophagitis: Secondary | ICD-10-CM | POA: Diagnosis not present

## 2022-11-09 DIAGNOSIS — K224 Dyskinesia of esophagus: Secondary | ICD-10-CM | POA: Diagnosis not present

## 2022-11-09 DIAGNOSIS — R1310 Dysphagia, unspecified: Secondary | ICD-10-CM | POA: Diagnosis not present

## 2022-12-19 ENCOUNTER — Emergency Department (HOSPITAL_COMMUNITY): Payer: Medicare HMO

## 2022-12-19 ENCOUNTER — Encounter (HOSPITAL_COMMUNITY): Payer: Self-pay

## 2022-12-19 ENCOUNTER — Inpatient Hospital Stay (HOSPITAL_COMMUNITY)
Admission: EM | Admit: 2022-12-19 | Discharge: 2022-12-22 | DRG: 605 | Disposition: A | Discharge status: Home Health | Payer: Medicare HMO | Attending: General Surgery | Admitting: General Surgery

## 2022-12-19 ENCOUNTER — Inpatient Hospital Stay (HOSPITAL_COMMUNITY): Payer: Medicare HMO

## 2022-12-19 DIAGNOSIS — Z79890 Hormone replacement therapy (postmenopausal): Secondary | ICD-10-CM

## 2022-12-19 DIAGNOSIS — S0181XA Laceration without foreign body of other part of head, initial encounter: Principal | ICD-10-CM | POA: Diagnosis present

## 2022-12-19 DIAGNOSIS — Z885 Allergy status to narcotic agent status: Secondary | ICD-10-CM

## 2022-12-19 DIAGNOSIS — M810 Age-related osteoporosis without current pathological fracture: Secondary | ICD-10-CM | POA: Diagnosis present

## 2022-12-19 DIAGNOSIS — I129 Hypertensive chronic kidney disease with stage 1 through stage 4 chronic kidney disease, or unspecified chronic kidney disease: Secondary | ICD-10-CM | POA: Diagnosis not present

## 2022-12-19 DIAGNOSIS — E1122 Type 2 diabetes mellitus with diabetic chronic kidney disease: Secondary | ICD-10-CM | POA: Diagnosis present

## 2022-12-19 DIAGNOSIS — Z66 Do not resuscitate: Secondary | ICD-10-CM | POA: Diagnosis present

## 2022-12-19 DIAGNOSIS — Z881 Allergy status to other antibiotic agents status: Secondary | ICD-10-CM

## 2022-12-19 DIAGNOSIS — E785 Hyperlipidemia, unspecified: Secondary | ICD-10-CM | POA: Diagnosis not present

## 2022-12-19 DIAGNOSIS — R609 Edema, unspecified: Secondary | ICD-10-CM | POA: Diagnosis not present

## 2022-12-19 DIAGNOSIS — Z79899 Other long term (current) drug therapy: Secondary | ICD-10-CM

## 2022-12-19 DIAGNOSIS — R918 Other nonspecific abnormal finding of lung field: Secondary | ICD-10-CM | POA: Diagnosis not present

## 2022-12-19 DIAGNOSIS — E119 Type 2 diabetes mellitus without complications: Secondary | ICD-10-CM | POA: Diagnosis not present

## 2022-12-19 DIAGNOSIS — I252 Old myocardial infarction: Secondary | ICD-10-CM | POA: Diagnosis not present

## 2022-12-19 DIAGNOSIS — F419 Anxiety disorder, unspecified: Secondary | ICD-10-CM | POA: Diagnosis present

## 2022-12-19 DIAGNOSIS — Z8249 Family history of ischemic heart disease and other diseases of the circulatory system: Secondary | ICD-10-CM | POA: Diagnosis not present

## 2022-12-19 DIAGNOSIS — Z886 Allergy status to analgesic agent status: Secondary | ICD-10-CM

## 2022-12-19 DIAGNOSIS — Z9049 Acquired absence of other specified parts of digestive tract: Secondary | ICD-10-CM

## 2022-12-19 DIAGNOSIS — S129XXA Fracture of neck, unspecified, initial encounter: Secondary | ICD-10-CM | POA: Diagnosis present

## 2022-12-19 DIAGNOSIS — Z90710 Acquired absence of both cervix and uterus: Secondary | ICD-10-CM | POA: Diagnosis not present

## 2022-12-19 DIAGNOSIS — K219 Gastro-esophageal reflux disease without esophagitis: Secondary | ICD-10-CM | POA: Diagnosis present

## 2022-12-19 DIAGNOSIS — M4802 Spinal stenosis, cervical region: Secondary | ICD-10-CM | POA: Diagnosis not present

## 2022-12-19 DIAGNOSIS — Z833 Family history of diabetes mellitus: Secondary | ICD-10-CM | POA: Diagnosis not present

## 2022-12-19 DIAGNOSIS — Z7985 Long-term (current) use of injectable non-insulin antidiabetic drugs: Secondary | ICD-10-CM

## 2022-12-19 DIAGNOSIS — E890 Postprocedural hypothyroidism: Secondary | ICD-10-CM | POA: Diagnosis not present

## 2022-12-19 DIAGNOSIS — S301XXA Contusion of abdominal wall, initial encounter: Secondary | ICD-10-CM | POA: Diagnosis present

## 2022-12-19 DIAGNOSIS — S0993XA Unspecified injury of face, initial encounter: Secondary | ICD-10-CM | POA: Diagnosis not present

## 2022-12-19 DIAGNOSIS — H547 Unspecified visual loss: Secondary | ICD-10-CM | POA: Diagnosis present

## 2022-12-19 DIAGNOSIS — S12501A Unspecified nondisplaced fracture of sixth cervical vertebra, initial encounter for closed fracture: Secondary | ICD-10-CM

## 2022-12-19 DIAGNOSIS — S12591A Other nondisplaced fracture of sixth cervical vertebra, initial encounter for closed fracture: Secondary | ICD-10-CM | POA: Diagnosis not present

## 2022-12-19 DIAGNOSIS — W109XXA Fall (on) (from) unspecified stairs and steps, initial encounter: Secondary | ICD-10-CM | POA: Diagnosis not present

## 2022-12-19 DIAGNOSIS — R739 Hyperglycemia, unspecified: Secondary | ICD-10-CM | POA: Diagnosis not present

## 2022-12-19 DIAGNOSIS — T1490XA Injury, unspecified, initial encounter: Secondary | ICD-10-CM | POA: Diagnosis not present

## 2022-12-19 DIAGNOSIS — R600 Localized edema: Secondary | ICD-10-CM | POA: Diagnosis not present

## 2022-12-19 DIAGNOSIS — D62 Acute posthemorrhagic anemia: Secondary | ICD-10-CM | POA: Diagnosis not present

## 2022-12-19 DIAGNOSIS — Z23 Encounter for immunization: Secondary | ICD-10-CM | POA: Diagnosis not present

## 2022-12-19 DIAGNOSIS — Z043 Encounter for examination and observation following other accident: Secondary | ICD-10-CM | POA: Diagnosis not present

## 2022-12-19 DIAGNOSIS — R519 Headache, unspecified: Secondary | ICD-10-CM | POA: Diagnosis present

## 2022-12-19 DIAGNOSIS — Z83438 Family history of other disorder of lipoprotein metabolism and other lipidemia: Secondary | ICD-10-CM

## 2022-12-19 DIAGNOSIS — R58 Hemorrhage, not elsewhere classified: Secondary | ICD-10-CM | POA: Diagnosis not present

## 2022-12-19 DIAGNOSIS — Z90722 Acquired absence of ovaries, bilateral: Secondary | ICD-10-CM

## 2022-12-19 DIAGNOSIS — W19XXXA Unspecified fall, initial encounter: Secondary | ICD-10-CM | POA: Diagnosis not present

## 2022-12-19 DIAGNOSIS — E538 Deficiency of other specified B group vitamins: Secondary | ICD-10-CM | POA: Diagnosis present

## 2022-12-19 DIAGNOSIS — R000 Tachycardia, unspecified: Secondary | ICD-10-CM | POA: Diagnosis not present

## 2022-12-19 DIAGNOSIS — S0003XA Contusion of scalp, initial encounter: Secondary | ICD-10-CM | POA: Diagnosis not present

## 2022-12-19 DIAGNOSIS — E038 Other specified hypothyroidism: Secondary | ICD-10-CM | POA: Diagnosis not present

## 2022-12-19 DIAGNOSIS — N184 Chronic kidney disease, stage 4 (severe): Secondary | ICD-10-CM | POA: Diagnosis not present

## 2022-12-19 DIAGNOSIS — T07XXXA Unspecified multiple injuries, initial encounter: Secondary | ICD-10-CM | POA: Diagnosis not present

## 2022-12-19 DIAGNOSIS — Z9079 Acquired absence of other genital organ(s): Secondary | ICD-10-CM

## 2022-12-19 DIAGNOSIS — I10 Essential (primary) hypertension: Secondary | ICD-10-CM | POA: Diagnosis not present

## 2022-12-19 DIAGNOSIS — Z7984 Long term (current) use of oral hypoglycemic drugs: Secondary | ICD-10-CM

## 2022-12-19 LAB — CBC
HCT: 34.5 % — ABNORMAL LOW (ref 36.0–46.0)
Hemoglobin: 11.3 g/dL — ABNORMAL LOW (ref 12.0–15.0)
MCH: 30.8 pg (ref 26.0–34.0)
MCHC: 32.8 g/dL (ref 30.0–36.0)
MCV: 94 fL (ref 80.0–100.0)
Platelets: 286 10*3/uL (ref 150–400)
RBC: 3.67 MIL/uL — ABNORMAL LOW (ref 3.87–5.11)
RDW: 12.6 % (ref 11.5–15.5)
WBC: 11.5 10*3/uL — ABNORMAL HIGH (ref 4.0–10.5)
nRBC: 0 % (ref 0.0–0.2)

## 2022-12-19 LAB — COMPREHENSIVE METABOLIC PANEL
ALT: 33 U/L (ref 0–44)
AST: 31 U/L (ref 15–41)
Albumin: 3.4 g/dL — ABNORMAL LOW (ref 3.5–5.0)
Alkaline Phosphatase: 26 U/L — ABNORMAL LOW (ref 38–126)
Anion gap: 10 (ref 5–15)
BUN: 20 mg/dL (ref 8–23)
CO2: 21 mmol/L — ABNORMAL LOW (ref 22–32)
Calcium: 8.6 mg/dL — ABNORMAL LOW (ref 8.9–10.3)
Chloride: 103 mmol/L (ref 98–111)
Creatinine, Ser: 2.03 mg/dL — ABNORMAL HIGH (ref 0.44–1.00)
GFR, Estimated: 25 mL/min — ABNORMAL LOW (ref >60)
Glucose, Bld: 272 mg/dL — ABNORMAL HIGH (ref 70–99)
Potassium: 3.9 mmol/L (ref 3.5–5.1)
Sodium: 134 mmol/L — ABNORMAL LOW (ref 135–145)
Total Bilirubin: 0.5 mg/dL (ref 0.3–1.2)
Total Protein: 6 g/dL — ABNORMAL LOW (ref 6.5–8.1)

## 2022-12-19 LAB — SAMPLE TO BLOOD BANK

## 2022-12-19 LAB — URINALYSIS, ROUTINE W REFLEX MICROSCOPIC
Bacteria, UA: NONE SEEN
Bilirubin Urine: NEGATIVE
Glucose, UA: >=500 mg/dL — AB
Hgb urine dipstick: NEGATIVE
Ketones, ur: NEGATIVE mg/dL
Leukocytes,Ua: NEGATIVE
Nitrite: NEGATIVE
Protein, ur: NEGATIVE mg/dL
Specific Gravity, Urine: 1.016 (ref 1.005–1.030)
pH: 7 (ref 5.0–8.0)

## 2022-12-19 LAB — I-STAT CHEM 8, ED
BUN: 24 mg/dL — ABNORMAL HIGH (ref 8–23)
Calcium, Ion: 1.12 mmol/L — ABNORMAL LOW (ref 1.15–1.40)
Chloride: 103 mmol/L (ref 98–111)
Creatinine, Ser: 2.1 mg/dL — ABNORMAL HIGH (ref 0.44–1.00)
Glucose, Bld: 265 mg/dL — ABNORMAL HIGH (ref 70–99)
HCT: 34 % — ABNORMAL LOW (ref 36.0–46.0)
Hemoglobin: 11.6 g/dL — ABNORMAL LOW (ref 12.0–15.0)
Potassium: 4.1 mmol/L (ref 3.5–5.1)
Sodium: 135 mmol/L (ref 135–145)
TCO2: 22 mmol/L (ref 22–32)

## 2022-12-19 LAB — PROTIME-INR
INR: 1 (ref 0.8–1.2)
Prothrombin Time: 13.2 seconds (ref 11.4–15.2)

## 2022-12-19 LAB — ETHANOL: Alcohol, Ethyl (B): <10 mg/dL (ref <10)

## 2022-12-19 LAB — LACTIC ACID, PLASMA: Lactic Acid, Venous: 2.3 mmol/L (ref 0.5–1.9)

## 2022-12-19 MED ORDER — CEFAZOLIN SODIUM-DEXTROSE 2-4 GM/100ML-% IV SOLN
2.0000 g | Freq: Once | INTRAVENOUS | Status: AC
  Administered 2022-12-19: 2 g via INTRAVENOUS
  Filled 2022-12-19: qty 100

## 2022-12-19 MED ORDER — SODIUM CHLORIDE 0.9 % IV BOLUS
1000.0000 mL | Freq: Once | INTRAVENOUS | Status: AC
  Administered 2022-12-19: 1000 mL via INTRAVENOUS

## 2022-12-19 MED ORDER — POLYETHYLENE GLYCOL 3350 17 G PO PACK: 17.0000 g | PACK | Freq: Every day | ORAL | Status: DC | PRN

## 2022-12-19 MED ORDER — HYDRALAZINE HCL 20 MG/ML IJ SOLN: 10.0000 mg | INTRAMUSCULAR | Status: DC | PRN

## 2022-12-19 MED ORDER — LEVOTHYROXINE SODIUM 50 MCG PO TABS
50.0000 ug | ORAL_TABLET | Freq: Every day | ORAL | Status: DC
  Administered 2022-12-20 – 2022-12-22 (×3): 50 ug via ORAL
  Filled 2022-12-19 (×3): qty 1

## 2022-12-19 MED ORDER — LIDOCAINE-EPINEPHRINE (PF) 2 %-1:200000 IJ SOLN
20.0000 mL | Freq: Once | INTRAMUSCULAR | Status: AC
  Administered 2022-12-19: 20 mL
  Filled 2022-12-19: qty 20

## 2022-12-19 MED ORDER — DOCUSATE SODIUM 100 MG PO CAPS
100.0000 mg | ORAL_CAPSULE | Freq: Two times a day (BID) | ORAL | Status: DC
  Administered 2022-12-20 – 2022-12-22 (×5): 100 mg via ORAL
  Filled 2022-12-19 (×5): qty 1

## 2022-12-19 MED ORDER — ACETAMINOPHEN 500 MG PO TABS
1000.0000 mg | ORAL_TABLET | Freq: Four times a day (QID) | ORAL | Status: DC
  Administered 2022-12-20 – 2022-12-22 (×10): 1000 mg via ORAL
  Filled 2022-12-19 (×12): qty 2

## 2022-12-19 MED ORDER — ONDANSETRON HCL 4 MG/2ML IJ SOLN: 4.0000 mg | Freq: Once | INTRAMUSCULAR | Status: DC

## 2022-12-19 MED ORDER — SODIUM CHLORIDE 0.9 % IV SOLN: INTRAVENOUS | Status: DC

## 2022-12-19 MED ORDER — ONDANSETRON HCL 4 MG/2ML IJ SOLN
4.0000 mg | Freq: Once | INTRAMUSCULAR | Status: AC
  Administered 2022-12-19: 4 mg via INTRAVENOUS
  Filled 2022-12-19: qty 2

## 2022-12-19 MED ORDER — FENTANYL CITRATE PF 50 MCG/ML IJ SOSY
50.0000 ug | PREFILLED_SYRINGE | Freq: Once | INTRAMUSCULAR | Status: AC
  Administered 2022-12-19: 50 ug via INTRAVENOUS
  Filled 2022-12-19: qty 1

## 2022-12-19 MED ORDER — ONDANSETRON HCL 4 MG/2ML IJ SOLN
4.0000 mg | Freq: Four times a day (QID) | INTRAMUSCULAR | Status: DC | PRN
  Administered 2022-12-22: 4 mg via INTRAVENOUS
  Filled 2022-12-19: qty 2

## 2022-12-19 MED ORDER — LIDOCAINE-EPINEPHRINE-TETRACAINE (LET) TOPICAL GEL
3.0000 mL | Freq: Once | TOPICAL | Status: AC
  Administered 2022-12-19: 3 mL via TOPICAL
  Filled 2022-12-19: qty 3

## 2022-12-19 MED ORDER — OXYCODONE HCL 5 MG PO TABS: 2.5000 mg | ORAL_TABLET | ORAL | Status: DC | PRN

## 2022-12-19 MED ORDER — ONDANSETRON HCL 4 MG/2ML IJ SOLN
INTRAMUSCULAR | Status: AC
  Administered 2022-12-19: 4 mg via INTRAVENOUS
  Filled 2022-12-19: qty 2

## 2022-12-19 MED ORDER — SODIUM CHLORIDE 0.9 % IV BOLUS
500.0000 mL | Freq: Once | INTRAVENOUS | Status: AC
  Administered 2022-12-20: 500 mL via INTRAVENOUS

## 2022-12-19 MED ORDER — TETANUS-DIPHTH-ACELL PERTUSSIS 5-2.5-18.5 LF-MCG/0.5 IM SUSY
0.5000 mL | PREFILLED_SYRINGE | Freq: Once | INTRAMUSCULAR | Status: AC
  Administered 2022-12-19: 0.5 mL via INTRAMUSCULAR
  Filled 2022-12-19: qty 0.5

## 2022-12-19 MED ORDER — EMPAGLIFLOZIN 10 MG PO TABS
10.0000 mg | ORAL_TABLET | Freq: Every day | ORAL | Status: DC
  Administered 2022-12-20 – 2022-12-22 (×3): 10 mg via ORAL
  Filled 2022-12-19 (×3): qty 1

## 2022-12-19 MED ORDER — HYDROMORPHONE HCL 1 MG/ML IJ SOLN
0.5000 mg | INTRAMUSCULAR | Status: DC | PRN
  Administered 2022-12-19: 0.5 mg via INTRAVENOUS
  Filled 2022-12-19: qty 1

## 2022-12-19 MED ORDER — ONDANSETRON HCL 4 MG/2ML IJ SOLN: 4.0000 mg | Freq: Once | INTRAMUSCULAR | Status: AC

## 2022-12-19 MED ORDER — ONDANSETRON 4 MG PO TBDP: 4.0000 mg | ORAL_TABLET | Freq: Four times a day (QID) | ORAL | Status: DC | PRN

## 2022-12-19 MED ORDER — IOHEXOL 350 MG/ML SOLN
75.0000 mL | Freq: Once | INTRAVENOUS | Status: AC | PRN
  Administered 2022-12-19: 75 mL via INTRAVENOUS

## 2022-12-19 MED ORDER — PROCHLORPERAZINE EDISYLATE 10 MG/2ML IJ SOLN
5.0000 mg | Freq: Once | INTRAMUSCULAR | Status: AC
  Administered 2022-12-19: 5 mg via INTRAVENOUS
  Filled 2022-12-19: qty 2

## 2022-12-19 MED ORDER — METHOCARBAMOL 1000 MG/10ML IJ SOLN
500.0000 mg | Freq: Three times a day (TID) | INTRAVENOUS | Status: DC
  Administered 2022-12-19: 500 mg via INTRAVENOUS
  Filled 2022-12-19: qty 5

## 2022-12-19 MED ORDER — METOPROLOL TARTRATE 5 MG/5ML IV SOLN: 5.0000 mg | Freq: Four times a day (QID) | INTRAVENOUS | Status: DC | PRN

## 2022-12-19 MED ORDER — METHOCARBAMOL 500 MG PO TABS
500.0000 mg | ORAL_TABLET | Freq: Three times a day (TID) | ORAL | Status: AC
  Administered 2022-12-20 – 2022-12-22 (×8): 500 mg via ORAL
  Filled 2022-12-19 (×8): qty 1

## 2022-12-19 MED ORDER — INSULIN ASPART 100 UNIT/ML IJ SOLN
0.0000 [IU] | Freq: Three times a day (TID) | INTRAMUSCULAR | Status: DC
  Administered 2022-12-20: 5 [IU] via SUBCUTANEOUS
  Administered 2022-12-20 – 2022-12-21 (×3): 3 [IU] via SUBCUTANEOUS
  Administered 2022-12-21: 2 [IU] via SUBCUTANEOUS
  Administered 2022-12-22 (×2): 3 [IU] via SUBCUTANEOUS

## 2022-12-19 NOTE — ED Notes (Signed)
Trauma MD paged to [facility]

## 2022-12-19 NOTE — ED Triage Notes (Signed)
To ED via [facility] - fell down a flight of stairs, hit head, no LOC- lac to forehead approx 2.5 - 3 " mid forehead. Bleeding controlled with gauze.  Pain in right shoulder/right flank with bruising, swelling to right femur with 10/10 pain,   See trauma chart for continuing notes

## 2022-12-19 NOTE — Progress Notes (Signed)
   12/19/22 1743  Spiritual Encounters  Type of Visit Attempt (pt unavailable)  Care provided to: [facility] present during encounter Nurse  Referral source Code page  Reason for visit Routine spiritual support  OnCall Visit Yes  Spiritual Framework  Presenting Themes Community and relationships;Other (comment)  Interventions  Spiritual Care Interventions Made Established relationship of care and support;Compassionate presence;Other (comment)   Arrived in ED, patients room has changed to 13. Patient with medical team who informed me that the patients family was in the waiting area. Went to talk with family who stated "mother" had falled down the steps. Mother is visually imparted and so is her husband. Family had encouraged the patient to not use the upstairs in her home, however patient continues to use it.  When patient fell she landed on her husband who is also visually imparted and the impact busted his lip, however he is believed to be ok.   Husband wants to go to assisted living because "they feel' (him and his children) that the patient and husband should no longer live alone. Discuss the possibility of home health care.  The daughter was in the waiting area while the son-in-law remained at the patient's home with her husband. The patient is going for a CT and other test, the gash in her forehead was rather large per nurse. The nurse stated they will inform daughter when patient is available for visits. Daughter stepped out to make phone calls. I informed daughter that I will check back with her later and that the nurse will come and get her.

## 2022-12-19 NOTE — ED Provider Notes (Signed)
[place] EMERGENCY DEPARTMENT AT [facility] Provider Note   CSN: [id number] Arrival date & time: 12/19/22  1732     History  No chief complaint on file.   ALAYASIA FRANTA is a 72 y.o. female.  Patient with a history of hypertension, diabetes, hypothyroidism, CKD presenting after fall downstairs.  She has poor vision at baseline.  States she lost her balance at the top of the stairs and fell down a flight of approximately 13 stairs.  No reported loss of consciousness.  Sustained laceration to her forehead, swelling to her right neck, right shoulder, right thigh and hip area.  Denies blood thinner use.  Denies loss of consciousness.  Denies any chest pain, shortness of breath, abdominal pain, nausea or vomiting.  Denies any preceding dizziness or lightheadedness.  Denies any syncope.  EMS reports laceration to forehead, deformity to right shoulder, right thigh and ecchymosis to right flank.  The history is provided by the patient.       Home Medications Prior to Admission medications   Medication Sig Start Date End Date Taking? Authorizing Provider  acetaminophen (TYLENOL) 325 MG tablet Take 2 tablets (650 mg total) by mouth every 6 (six) hours as needed. 09/29/22   Jerald Kief, MD  Coenzyme Q10 (CO Q 10 PO) Take 1 capsule by mouth daily.    [provider]  cyanocobalamin (,VITAMIN B-12,) 1000 MCG/ML injection Inject 1,000 mcg into the muscle See admin instructions. 1000 mcg on the 10th of every month, every 30 days. 11/02/19   [provider]  Dulaglutide (TRULICITY) 1.5 MG/0.5ML SOPN Inject 1.5 mg into the skin every Wednesday. 03/20/21   [provider]  empagliflozin (JARDIANCE) 10 MG TABS tablet Take 1 tablet (10 mg total) by mouth daily. 03/25/21   Kroeger, Ovidio Kin., PA-C  famotidine (PEPCID) 40 MG tablet TAKE 1 TABLET TWICE DAILY 03/23/22   Unk Lightning, PA  hydrALAZINE (APRESOLINE) 10 MG tablet Take 1 tablet (10 mg total) by mouth  3 (three) times daily as needed. hydralazine 10 mg as needed sbp greater than 160. Please  keep a blood pressure diary. Patient taking differently: Take 10 mg by mouth 3 (three) times daily as needed (SBP > 160). 08/02/22   Joylene Grapes, NP  levothyroxine (SYNTHROID) 50 MCG tablet Take 50 mcg by mouth daily before breakfast.    [provider]  LORazepam (ATIVAN) 0.5 MG tablet Take 0.5 mg by mouth 2 (two) times daily as needed for anxiety.    [provider]  magnesium oxide (MAG-OX) 400 MG tablet Take 400 mg by mouth daily.    [provider]  metoprolol succinate (TOPROL-XL) 25 MG 24 hr tablet Take 1.5 tablets (37.5 mg total) by mouth daily. Patient not taking: Reported on 10/21/2022 10/15/22   Lennette Bihari, MD  nitroGLYCERIN (NITROSTAT) 0.4 MG SL tablet Place 1 tablet (0.4 mg total) under the tongue every 5 (five) minutes x 3 doses as needed for chest pain. 01/01/20   Bhagat, Sharrell Ku, PA  pantoprazole (PROTONIX) 40 MG tablet Take 40 mg by mouth 2 (two) times daily. 03/22/20   [provider]  Probiotic Product (PROBIOTIC PO) Take 1 capsule by mouth 2 (two) times daily.    [provider]  rosuvastatin (CRESTOR) 40 MG tablet Take 1 tablet (40 mg total) by mouth daily. Patient taking differently: Take 40 mg by mouth at bedtime. 08/03/22   Lennette Bihari, MD  sertraline (ZOLOFT) 50 MG tablet Take  50 mg by mouth at bedtime.  03/06/20   [provider]      Allergies    Codeine, Erythromycin, Levaquin [levofloxacin], Asa [aspirin], Motrin [ibuprofen], and Ultram [tramadol]    Review of Systems   Review of Systems  Constitutional:  Negative for activity change, appetite change and fever.  HENT:  Negative for congestion and rhinorrhea.   Respiratory:  Negative for cough, chest tightness and shortness of breath.   Cardiovascular:  Negative for chest pain.  Gastrointestinal:  Negative for abdominal pain, nausea and vomiting.  Genitourinary:   Negative for dysuria.  Musculoskeletal:  Positive for arthralgias and myalgias. Negative for back pain.  Skin:  Negative for rash.  Neurological:  Positive for weakness and headaches.   all other systems are negative except as noted in the HPI and PMH.    Physical Exam Updated Vital Signs There were no vitals taken for this visit. Physical Exam Vitals and nursing note reviewed.  Constitutional:      General: She is not in acute distress.    Appearance: Normal appearance. She is well-developed and normal weight. She is not ill-appearing.  HENT:     Head: Normocephalic.     Comments: 5 cm mid forehead laceration irregular.    Mouth/Throat:     Pharynx: No oropharyngeal exudate.  Eyes:     Conjunctiva/sclera: Conjunctivae normal.     Pupils: Pupils are equal, round, and reactive to light.  Neck:     Comments: Hematoma below right ear, tender.  C-collar placed on arrival, no midline tenderness Cardiovascular:     Rate and Rhythm: Normal rate and regular rhythm.     Heart sounds: Normal heart sounds. No murmur heard. Pulmonary:     Effort: Pulmonary effort is normal. No respiratory distress.     Breath sounds: Normal breath sounds.  Chest:     Chest wall: No tenderness.  Abdominal:     Palpations: Abdomen is soft.     Tenderness: There is no abdominal tenderness. There is no guarding or rebound.  Musculoskeletal:        General: No tenderness. Normal range of motion.     Cervical back: Normal range of motion and neck supple.     Comments: No T or L-spine tenderness.  Tender to palpation right proximal shoulder.  Tender to palpation with deformity right mid femur. Pelvis is stable.  Intact DP and PT pulses  Ecchymosis right flank, older appearing bruising left flank  Skin:    General: Skin is warm.     Capillary Refill: Capillary refill takes less than 2 seconds.  Neurological:     General: No focal deficit present.     Mental Status: She is alert and oriented to  person, place, and time. Mental status is at baseline.     Cranial Nerves: No cranial nerve deficit.     Motor: No abnormal muscle tone.     Coordination: Coordination normal.     Comments: No ataxia on finger to nose bilaterally. No pronator drift. 5/5 strength throughout. CN 2-12 intact.Equal grip strength. Sensation intact.   Psychiatric:        Behavior: Behavior normal.     ED Results / Procedures / Treatments   Labs (all labs ordered are listed, but only abnormal results are displayed) Labs Reviewed  COMPREHENSIVE METABOLIC PANEL - Abnormal; Notable for the following components:      Result Value   Sodium 134 (*)    CO2 21 (*)  Glucose, Bld 272 (*)    Creatinine, Ser 2.03 (*)    Calcium 8.6 (*)    Total Protein 6.0 (*)    Albumin 3.4 (*)    Alkaline Phosphatase 26 (*)    GFR, Estimated 25 (*)    All other components within normal limits  CBC - Abnormal; Notable for the following components:   WBC 11.5 (*)    RBC 3.67 (*)    Hemoglobin 11.3 (*)    HCT 34.5 (*)    All other components within normal limits  LACTIC ACID, PLASMA - Abnormal; Notable for the following components:   Lactic Acid, Venous 2.3 (*)    All other components within normal limits  I-STAT CHEM 8, ED - Abnormal; Notable for the following components:   BUN 24 (*)    Creatinine, Ser 2.10 (*)    Glucose, Bld 265 (*)    Calcium, Ion 1.12 (*)    Hemoglobin 11.6 (*)    HCT 34.0 (*)    All other components within normal limits  ETHANOL  PROTIME-INR  URINALYSIS, ROUTINE W REFLEX MICROSCOPIC  SAMPLE TO BLOOD BANK    EKG EKG Interpretation  Date/Time:  Sunday Dec 19 2022 19:52:39 EDT Ventricular Rate:  97 PR Interval:  146 QRS Duration: 94 QT Interval:  360 QTC Calculation: 458 R Axis:   59 Text Interpretation: Sinus rhythm Borderline low voltage, extremity leads Rate faster Confirmed by Glynn Octave [phone]) on 12/19/2022 8:03:42 PM  Radiology CT ANGIO NECK W OR WO CONTRAST  Result Date:  12/19/2022 CLINICAL DATA:  Head trauma EXAM: CT ANGIOGRAPHY NECK TECHNIQUE: Multidetector CT imaging of the neck was performed using the standard protocol during bolus administration of intravenous contrast. Multiplanar CT image reconstructions and MIPs were obtained to evaluate the vascular anatomy. Carotid stenosis measurements (when applicable) are obtained utilizing NASCET criteria, using the distal internal carotid diameter as the denominator. RADIATION DOSE REDUCTION: This exam was performed according to the departmental dose-optimization program which includes automated exposure control, adjustment of the mA and/or kV according to patient size and/or use of iterative reconstruction technique. CONTRAST:  75mL OMNIPAQUE IOHEXOL 350 MG/ML SOLN COMPARISON:  None Available. FINDINGS: Aortic arch: Standard branching. Imaged portion shows no evidence of aneurysm or dissection. No significant stenosis of the major arch vessel origins. Right carotid system: No evidence of dissection, stenosis (50% or greater) or occlusion. Left carotid system: No evidence of dissection, stenosis (50% or greater) or occlusion. Vertebral arteries: Left dominant.  Normal. Skeleton: Please refer to dedicated report for cervical spine CT. Other neck: There is soft tissue edema of the right neck with mild asymmetric expansion of the right sternocleidomastoid, likely indicating mild intramuscular hematoma. Upper chest: Clear IMPRESSION: 1. No arterial injury in the neck. 2. Soft tissue edema of the right neck with mild asymmetric expansion of the right sternocleidomastoid, likely indicating mild intramuscular hematoma. Electronically Signed   By: Deatra Robinson M.D.   On: 12/19/2022 19:37   CT CHEST ABDOMEN PELVIS W CONTRAST  Result Date: 12/19/2022 CLINICAL DATA:  Polytrauma, blunt EXAM: CT CHEST, ABDOMEN, AND PELVIS WITH CONTRAST TECHNIQUE: Multidetector CT imaging of the chest, abdomen and pelvis was performed following the standard  protocol during bolus administration of intravenous contrast. RADIATION DOSE REDUCTION: This exam was performed according to the departmental dose-optimization program which includes automated exposure control, adjustment of the mA and/or kV according to patient size and/or use of iterative reconstruction technique. CONTRAST:  75mL OMNIPAQUE IOHEXOL 350 MG/ML SOLN COMPARISON:  CT abdomen pelvis 09/28/2022, CT abdomen pelvis 04/18/2006, CT chest 06/30/2021 FINDINGS: CHEST: Cardiovascular: No aortic injury. The thoracic aorta is normal in caliber. The heart is normal in size. No significant pericardial effusion. Mild atherosclerotic plaque. At least 2 vessel coronary calcification. Mediastinum/Nodes: No pneumomediastinum. No mediastinal hematoma. The esophagus is unremarkable. The thyroid is unremarkable. The central airways are patent. No mediastinal, hilar, or axillary lymphadenopathy. Lungs/Pleura: No focal consolidation. Stable chronic lingular 1.6 x 1.2 cm pulmonary nodule-no further follow-up indicated as likely benign. Stable chronic subpleural right lung 3 mm pulmonary nodule-no further follow-up indicated. No pulmonary mass. No pulmonary contusion or laceration. No pneumatocele formation. No pleural effusion. No pneumothorax. No hemothorax. Musculoskeletal/Chest wall: No chest wall mass. No acute rib or sternal fracture. No spinal fracture. Chronic superior endplate T12 vertebral body height loss in the setting of a Schmorl node. ABDOMEN / PELVIS: Hepatobiliary: Not enlarged. No focal lesion. No laceration or subcapsular hematoma. Status post cholecystectomy. Dilated common bile duct measuring up to 15 mm with no intrahepatic biliary ductal dilatation. Finding can be seen in the post cholecystectomy setting. No biliary ductal dilatation. Pancreas: Normal pancreatic contour. No main pancreatic duct dilatation. Spleen: Not enlarged. No focal lesion. No laceration, subcapsular hematoma, or vascular injury.  Adrenals/Urinary Tract: No nodularity bilaterally. Bilateral kidneys enhance symmetrically. No hydronephrosis. No contusion, laceration, or subcapsular hematoma. No injury to the vascular structures or collecting systems. No hydroureter. The urinary bladder is unremarkable. On delayed imaging, there is no urothelial wall thickening and there are no filling defects in the opacified portions of the bilateral collecting systems or ureters. Stomach/Bowel: No small or large bowel wall thickening or dilatation. Colonic diverticulosis. The appendix is unremarkable. Vasculature/Lymphatics: Moderate atherosclerotic plaque. No abdominal aorta or iliac aneurysm. No active contrast extravasation or pseudoaneurysm. No abdominal, pelvic, inguinal lymphadenopathy. Reproductive: Normal. Other: No simple free fluid ascites. No pneumoperitoneum. No hemoperitoneum. No mesenteric hematoma identified. No organized fluid collection. Musculoskeletal: A right flank 13.8 x 5.5 x7 cm subcutaneus soft tissue hematoma with high density material within the hematoma and blooming noted on delayed imaging. Surrounding subcutaneus soft tissue fat stranding. No acute pelvic fracture. No spinal fracture.  L1 kyphoplasty. Ports and Devices: None. IMPRESSION: 1. A right flank 14 x 6 x 7 cm subcutaneus soft tissue hematoma with active extravasation. Surrounding subcutaneus soft tissue fat stranding. 2. No acute intrathoracic, intra-abdominal, intrapelvic traumatic injury. 3. No acute fracture or traumatic malalignment of the thoracic or lumbar spine. These results were called by telephone at the time of interpretation on 12/19/2022 at 7:21 pm to provider [facility] , who verbally acknowledged these results. Electronically Signed   By: Tish Frederickson M.D.   On: 12/19/2022 19:35   DG Humerus Right  Result Date: 12/19/2022 CLINICAL DATA:  Status post fall. EXAM: RIGHT HUMERUS - 2+ VIEW COMPARISON:  None Available. FINDINGS: There is no evidence of  fracture or other focal bone lesions. Soft tissues are unremarkable. IMPRESSION: Negative. Electronically Signed   By: Ted Mcalpine M.D.   On: 12/19/2022 19:33   CT HEAD WO CONTRAST  Addendum Date: 12/19/2022   ADDENDUM REPORT: 12/19/2022 19:23 ADDENDUM: Asymmetric right neck musculature with subcutaneus soft tissue edema. Query intramuscular hematoma. Please see separately dictated CT angio neck 12/19/2022. These results were called by telephone at the time of interpretation on 12/19/2022 at 7:21 pm to provider [facility] , who verbally acknowledged these results. Electronically Signed   By: Tish Frederickson M.D.   On: 12/19/2022 19:23   Result  Date: 12/19/2022 CLINICAL DATA:  Head trauma, moderate-severe; Facial trauma, blunt; Polytrauma, blunt EXAM: CT HEAD WITHOUT CONTRAST CT MAXILLOFACIAL WITHOUT CONTRAST CT CERVICAL SPINE WITHOUT CONTRAST TECHNIQUE: Multidetector CT imaging of the head, cervical spine, and maxillofacial structures were performed using the standard protocol without intravenous contrast. Multiplanar CT image reconstructions of the cervical spine and maxillofacial structures were also generated. RADIATION DOSE REDUCTION: This exam was performed according to the departmental dose-optimization program which includes automated exposure control, adjustment of the mA and/or kV according to patient size and/or use of iterative reconstruction technique. COMPARISON:  CT head and C-spine 09/28/2022 FINDINGS: CT HEAD FINDINGS Brain: Left occipital encephalomalacia. No evidence of large-territorial acute infarction. No parenchymal hemorrhage. No mass lesion. No extra-axial collection. No mass effect or midline shift. No hydrocephalus. Basilar cisterns are patent. Partial empty sella. Vascular: No hyperdense vessel. Skull: No acute fracture or focal lesion. Other: Right frontal scalp hematoma measuring up to 4 mm. CT MAXILLOFACIAL FINDINGS Osseous: No fracture or mandibular dislocation. No  destructive process. Sinuses/Orbits: Paranasal sinuses and mastoid air cells are clear. The orbits are unremarkable. Soft tissues: Right periorbital subcutaneus soft tissue edema and emphysema with overlying soft tissue defect of the nasal bridge and medial right periorbital region. CT CERVICAL SPINE FINDINGS Alignment: Stable grade 1 anterolisthesis of C3 on C4. Skull base and vertebrae: Interval development of an age-indeterminate, possibly acute, minimally displaced triangular fracture of the C6 anterior inferior endplate (8:37). Multilevel degenerative changes of the spine with posterior disc osteophyte complex formation at the C5-C6 and C6-C7 levels. Multilevel disc bulges. Ligamentum flavum calcification. No associated severe osseous neural foraminal or central canal stenosis. No aggressive appearing focal osseous lesion or focal pathologic process. Soft tissues and spinal canal: No prevertebral fluid or swelling. No visible canal hematoma. Upper chest: Unremarkable. Other: None. IMPRESSION: 1. No acute intracranial abnormality. 2.  No acute displaced facial fracture. 3. Suggestion of an acute teardrop fracture of the C6 anterior inferior endplate. Recommend MRI for further evaluation. 4. A 4 mm right frontal scalp hematoma with soft tissue defect of the nasal bridge and medial right periorbital region. Electronically Signed: By: Tish Frederickson M.D. On: 12/19/2022 19:13   CT MAXILLOFACIAL WO CONTRAST  Addendum Date: 12/19/2022   ADDENDUM REPORT: 12/19/2022 19:23 ADDENDUM: Asymmetric right neck musculature with subcutaneus soft tissue edema. Query intramuscular hematoma. Please see separately dictated CT angio neck 12/19/2022. These results were called by telephone at the time of interpretation on 12/19/2022 at 7:21 pm to provider [facility] , who verbally acknowledged these results. Electronically Signed   By: Tish Frederickson M.D.   On: 12/19/2022 19:23   Result Date: 12/19/2022 CLINICAL DATA:  Head  trauma, moderate-severe; Facial trauma, blunt; Polytrauma, blunt EXAM: CT HEAD WITHOUT CONTRAST CT MAXILLOFACIAL WITHOUT CONTRAST CT CERVICAL SPINE WITHOUT CONTRAST TECHNIQUE: Multidetector CT imaging of the head, cervical spine, and maxillofacial structures were performed using the standard protocol without intravenous contrast. Multiplanar CT image reconstructions of the cervical spine and maxillofacial structures were also generated. RADIATION DOSE REDUCTION: This exam was performed according to the departmental dose-optimization program which includes automated exposure control, adjustment of the mA and/or kV according to patient size and/or use of iterative reconstruction technique. COMPARISON:  CT head and C-spine 09/28/2022 FINDINGS: CT HEAD FINDINGS Brain: Left occipital encephalomalacia. No evidence of large-territorial acute infarction. No parenchymal hemorrhage. No mass lesion. No extra-axial collection. No mass effect or midline shift. No hydrocephalus. Basilar cisterns are patent. Partial empty sella. Vascular: No hyperdense vessel. Skull: No  acute fracture or focal lesion. Other: Right frontal scalp hematoma measuring up to 4 mm. CT MAXILLOFACIAL FINDINGS Osseous: No fracture or mandibular dislocation. No destructive process. Sinuses/Orbits: Paranasal sinuses and mastoid air cells are clear. The orbits are unremarkable. Soft tissues: Right periorbital subcutaneus soft tissue edema and emphysema with overlying soft tissue defect of the nasal bridge and medial right periorbital region. CT CERVICAL SPINE FINDINGS Alignment: Stable grade 1 anterolisthesis of C3 on C4. Skull base and vertebrae: Interval development of an age-indeterminate, possibly acute, minimally displaced triangular fracture of the C6 anterior inferior endplate (8:37). Multilevel degenerative changes of the spine with posterior disc osteophyte complex formation at the C5-C6 and C6-C7 levels. Multilevel disc bulges. Ligamentum flavum  calcification. No associated severe osseous neural foraminal or central canal stenosis. No aggressive appearing focal osseous lesion or focal pathologic process. Soft tissues and spinal canal: No prevertebral fluid or swelling. No visible canal hematoma. Upper chest: Unremarkable. Other: None. IMPRESSION: 1. No acute intracranial abnormality. 2.  No acute displaced facial fracture. 3. Suggestion of an acute teardrop fracture of the C6 anterior inferior endplate. Recommend MRI for further evaluation. 4. A 4 mm right frontal scalp hematoma with soft tissue defect of the nasal bridge and medial right periorbital region. Electronically Signed: By: Tish Frederickson M.D. On: 12/19/2022 19:13   CT CERVICAL SPINE WO CONTRAST  Addendum Date: 12/19/2022   ADDENDUM REPORT: 12/19/2022 19:23 ADDENDUM: Asymmetric right neck musculature with subcutaneus soft tissue edema. Query intramuscular hematoma. Please see separately dictated CT angio neck 12/19/2022. These results were called by telephone at the time of interpretation on 12/19/2022 at 7:21 pm to provider [facility] , who verbally acknowledged these results. Electronically Signed   By: Tish Frederickson M.D.   On: 12/19/2022 19:23   Result Date: 12/19/2022 CLINICAL DATA:  Head trauma, moderate-severe; Facial trauma, blunt; Polytrauma, blunt EXAM: CT HEAD WITHOUT CONTRAST CT MAXILLOFACIAL WITHOUT CONTRAST CT CERVICAL SPINE WITHOUT CONTRAST TECHNIQUE: Multidetector CT imaging of the head, cervical spine, and maxillofacial structures were performed using the standard protocol without intravenous contrast. Multiplanar CT image reconstructions of the cervical spine and maxillofacial structures were also generated. RADIATION DOSE REDUCTION: This exam was performed according to the departmental dose-optimization program which includes automated exposure control, adjustment of the mA and/or kV according to patient size and/or use of iterative reconstruction technique.  COMPARISON:  CT head and C-spine 09/28/2022 FINDINGS: CT HEAD FINDINGS Brain: Left occipital encephalomalacia. No evidence of large-territorial acute infarction. No parenchymal hemorrhage. No mass lesion. No extra-axial collection. No mass effect or midline shift. No hydrocephalus. Basilar cisterns are patent. Partial empty sella. Vascular: No hyperdense vessel. Skull: No acute fracture or focal lesion. Other: Right frontal scalp hematoma measuring up to 4 mm. CT MAXILLOFACIAL FINDINGS Osseous: No fracture or mandibular dislocation. No destructive process. Sinuses/Orbits: Paranasal sinuses and mastoid air cells are clear. The orbits are unremarkable. Soft tissues: Right periorbital subcutaneus soft tissue edema and emphysema with overlying soft tissue defect of the nasal bridge and medial right periorbital region. CT CERVICAL SPINE FINDINGS Alignment: Stable grade 1 anterolisthesis of C3 on C4. Skull base and vertebrae: Interval development of an age-indeterminate, possibly acute, minimally displaced triangular fracture of the C6 anterior inferior endplate (8:37). Multilevel degenerative changes of the spine with posterior disc osteophyte complex formation at the C5-C6 and C6-C7 levels. Multilevel disc bulges. Ligamentum flavum calcification. No associated severe osseous neural foraminal or central canal stenosis. No aggressive appearing focal osseous lesion or focal pathologic process. Soft tissues and  spinal canal: No prevertebral fluid or swelling. No visible canal hematoma. Upper chest: Unremarkable. Other: None. IMPRESSION: 1. No acute intracranial abnormality. 2.  No acute displaced facial fracture. 3. Suggestion of an acute teardrop fracture of the C6 anterior inferior endplate. Recommend MRI for further evaluation. 4. A 4 mm right frontal scalp hematoma with soft tissue defect of the nasal bridge and medial right periorbital region. Electronically Signed: By: Tish Frederickson M.D. On: 12/19/2022 19:13   DG  Femur Min 2 Views Right  Result Date: 12/19/2022 CLINICAL DATA:  Fall down stairs. EXAM: RIGHT FEMUR 2 VIEWS COMPARISON:  None Available. FINDINGS: There is no evidence of fracture or other focal bone lesions. Cortical margins of the femur are intact. Hip and knee alignment are maintained. Soft tissues are unremarkable. IMPRESSION: No fracture of the right femur. Electronically Signed   By: Narda Rutherford M.D.   On: 12/19/2022 18:27   DG Shoulder Right  Result Date: 12/19/2022 CLINICAL DATA:  Fall down stairs. EXAM: RIGHT SHOULDER - 2+ VIEW COMPARISON:  None Available. FINDINGS: There is no evidence of fracture or dislocation. There is no evidence of arthropathy or other focal bone abnormality. Soft tissues are unremarkable. IMPRESSION: No fracture or dislocation of the right shoulder. Electronically Signed   By: Narda Rutherford M.D.   On: 12/19/2022 18:26   DG Pelvis Portable  Result Date: 12/19/2022 CLINICAL DATA:  Trauma, fall down stairs. EXAM: PORTABLE PELVIS 1-2 VIEWS COMPARISON:  None Available. FINDINGS: Please note that the inferior pubic rami and portions of the proximal femurs are not included in the field of view. No fracture of the included pelvis. No pubic symphyseal or sacroiliac diastasis. Femoral heads are seated in the acetabulum. IMPRESSION: No fracture of the pelvis, lower aspect excluded from the field of view. CT is planned. Electronically Signed   By: Narda Rutherford M.D.   On: 12/19/2022 18:25   DG Chest Port 1 View  Result Date: 12/19/2022 CLINICAL DATA:  Trauma, fall down stairs. EXAM: PORTABLE CHEST 1 VIEW COMPARISON:  Radiograph 09/28/2022 FINDINGS: The cardiomediastinal contours are normal. The lungs are clear. Pulmonary vasculature is normal. No consolidation, pleural effusion, or pneumothorax. Stable lingular nodule. No acute osseous abnormalities are seen. IMPRESSION: 1. No acute chest findings. 2. Stable lingular nodule. Electronically Signed   By: Narda Rutherford  M.D.   On: 12/19/2022 18:24    Procedures .Critical Care  Performed by: Glynn Octave, MD Authorized by: Glynn Octave, MD   Critical care provider statement:    Critical care time (minutes):  45   Critical care time was exclusive of:  Separately billable procedures and treating other patients   Critical care was necessary to treat or prevent imminent or life-threatening deterioration of the following conditions:  Trauma   Critical care was time spent personally by me on the following activities:  Development of treatment plan with patient or surrogate, discussions with consultants, evaluation of patient's response to treatment, examination of patient, ordering and review of laboratory studies, ordering and review of radiographic studies, ordering and performing treatments and interventions, pulse oximetry, re-evaluation of patient's condition, review of old charts, blood draw for specimens and obtaining history from patient or surrogate   I assumed direction of critical care for this patient from another provider in my specialty: no     Care discussed with: admitting provider       Medications Ordered in ED Medications  sodium chloride 0.9 % bolus 1,000 mL (has no administration in time range)  And  0.9 %  sodium chloride infusion (has no administration in time range)  ceFAZolin (ANCEF) IVPB 2g/100 mL premix (has no administration in time range)  fentaNYL (SUBLIMAZE) injection 50 mcg (has no administration in time range)  Tdap (BOOSTRIX) injection 0.5 mL (has no administration in time range)  lidocaine-EPINEPHrine (XYLOCAINE W/EPI) 2 %-1:200000 (PF) injection 20 mL (has no administration in time range)    ED Course/ Medical Decision Making/ A&P                             Medical Decision Making Amount and/or Complexity of Data Reviewed Labs: ordered. Decision-making details documented in ED Course. Radiology: ordered and independent interpretation performed.  Decision-making details documented in ED Course. ECG/medicine tests: ordered and independent interpretation performed. Decision-making details documented in ED Course.  Risk Prescription drug management. Decision regarding hospitalization.   Level 2 fall downstairs.  GCS is 15, ABCs are intact.  She has laceration to forehead, swelling to right neck, deformity to right shoulder, right hip and thigh.  Patient arrives without c-collar which was immediately placed on arrival.  Patient awake and alert and answering questions.  Stable vital signs.  Equal breath sounds.  Chest x-ray negative for rib fracture or pneumothorax.  Pelvic x-ray is negative.  Does have tenderness to palpation of right shoulder, right humerus, right hip and right femur.  Plain film x-rays are negative.  Results reviewed interpreted by me.  Patient sent to CT scan for trauma CT scans.  CT head is negative for acute traumatic pathology.  Questionable teardrop fracture of C6.  MRI recommended by radiology.  Concern for right-sided neck swelling with possible expanding hematoma.  CTA is negative for acute arterial injury.  CT chest abdomen pelvis is remarkable for expanding right flank hematoma with active extravasation.  Discussed with Dr. Sheliah Hatch, surgery who will evaluate.  Maintain c-collar at this time.  Tetanus updated.  Ancef given.  Forehead laceration will be cleaned and closed  Laceration repaired by Zelaya PA-C. Vitals remain stable in the ED. Will need to monitor R flank hematoma. No anticoagulation to reverse. MRI C spine pending.   D/w Dr. Sheliah Hatch who will admit.         Final Clinical Impression(s) / ED Diagnoses Final diagnoses:  Fall, initial encounter  Closed nondisplaced fracture of sixth cervical vertebra, unspecified fracture morphology, initial encounter (HCC)  Hematoma of right flank, initial encounter  Laceration of forehead, initial encounter    Rx / DC Orders ED Discharge  Orders     None         Roslin Norwood, Jeannett Senior, MD 12/19/22 2212

## 2022-12-19 NOTE — ED Notes (Signed)
ED TO INPATIENT HANDOFF REPORT  ED Nurse Name and Phone #:   S Name/Age/Gender Keene Breath 72 y.o. female Room/Bed: 008C/008C  Code Status   Code Status: DNR  Home/SNF/Other Home Patient oriented to: self, place, time, and situation Is this baseline? Yes   Triage Complete: Triage complete  Chief Complaint Cervical spine fracture (HCC) [S12.9XXA]  Triage Note To ED via [facility] - fell down a flight of stairs, hit head, no LOC- lac to forehead approx 2.5 - 3 " mid forehead. Bleeding controlled with gauze.  Pain in right shoulder/right flank with bruising, swelling to right femur with 10/10 pain,   See trauma chart for continuing notes   Allergies Allergies  Allergen Reactions   Codeine Shortness Of Breath and Nausea And Vomiting   Erythromycin Nausea And Vomiting   Levaquin [Levofloxacin] Shortness Of Breath   Motrin [Ibuprofen] Other (See Comments)    Pt told to avoid ibuprofen due to her heart.   Ultram [Tramadol] Nausea And Vomiting    Level of Care/Admitting Diagnosis ED Disposition     ED Disposition  Admit   Condition  --   Comment  Hospital Area: [facility] [[id number]]  Level of Care: Progressive [102]  Admit to Progressive based on following criteria: NEUROLOGICAL AND NEUROSURGICAL complex patients with significant risk of instability, who do not meet ICU criteria, yet require close observation or frequent assessment (< / = every 2 - 4 hours) with medical / nursing intervention.  May admit patient to [facility] or [facility] if equivalent level of care is available:: No  Covid Evaluation: Asymptomatic - no recent exposure (last 10 days) testing not required  Diagnosis: Cervical spine fracture [facility]) [[id number]]  Admitting Physician: TRAUMA MD [[id number]]  Attending Physician: TRAUMA MD [[id number]]  Certification:: I certify this patient will need inpatient services for at least 2 midnights  Estimated Length of Stay: 3           B Medical/Surgery History Past Medical History:  Diagnosis Date   Anemia    Anxiety    CKD (chronic kidney disease)    CKD III per PCP notes-pt denies   Congenital blindness    Diabetes mellitus    Diverticula of small intestine    Esophageal stricture    GERD (gastroesophageal reflux disease)    H/O hiatal hernia    Hyperlipidemia    Hypertension    Hypothyroidism    Iron deficiency anemia    Osteopenia    Osteoporosis    Postoperative nausea and vomiting    Retinitis    STEMI (ST elevation myocardial infarction) (HCC)    SVT (supraventricular tachycardia)    Vitamin B 12 deficiency    Past Surgical History:  Procedure Laterality Date   BACK SURGERY     CARPAL TUNNEL RELEASE     Right    CESAREAN SECTION     CHOLECYSTECTOMY  07/27/1983   COLONOSCOPY     CORONARY BALLOON ANGIOPLASTY N/A 12/30/2019   Procedure: CORONARY BALLOON ANGIOPLASTY;  Surgeon: Lennette Bihari, MD;  Location: [facility] INVASIVE CV LAB;  Service: Cardiovascular;  Laterality: N/A;   CORONARY/GRAFT ACUTE MI REVASCULARIZATION N/A 12/30/2019   Procedure: Coronary/Graft Acute MI Revascularization;  Surgeon: Lennette Bihari, MD;  Location: [facility] INVASIVE CV LAB;  Service: Cardiovascular;  Laterality: N/A;   HERNIA REPAIR     pt denies   LEFT HEART CATH AND CORONARY ANGIOGRAPHY N/A 12/30/2019   Procedure: LEFT HEART CATH AND CORONARY ANGIOGRAPHY;  Surgeon: Lennette Bihari, MD;  Location: [facility] INVASIVE CV LAB;  Service: Cardiovascular;  Laterality: N/A;   Right knee arthroscopy  07/27/1991   Right thyroidectomy  07/26/2005   TOTAL ABDOMINAL HYSTERECTOMY W/ BILATERAL SALPINGOOPHORECTOMY  07/27/1991   UPPER GASTROINTESTINAL ENDOSCOPY       A IV Location/Drains/Wounds Patient Lines/Drains/Airways Status     Active Line/Drains/Airways     Name Placement date Placement time Site Days   Peripheral IV 09/28/22 20 G 2.5" Anterior;Left;Upper Arm 09/28/22  0535  Arm  82   Peripheral IV 12/19/22 20 G Anterior;Right  Antecubital 12/19/22  1752  Antecubital  less than 1   External Urinary Catheter 12/19/22  1926  --  less than 1   Wound / Incision (Open or Dehisced) 09/28/22 Other (Comment) Buttocks Right;Lower DTI 09/28/22  --  Buttocks  82   Wound / Incision (Open or Dehisced) 09/28/22 Other (Comment) Labia Left;Lower;Mid;Posterior hematoma 09/28/22  --  Labia  82            Intake/Output Last 24 hours No intake or output data in the 24 hours ending 12/19/22 2212  Labs/Imaging Results for orders placed or performed during the hospital encounter of 12/19/22 (from the past 48 hour(s))  Comprehensive metabolic panel     Status: Abnormal   Collection Time: 12/19/22  5:33 PM  Result Value Ref Range   Sodium 134 (L) 135 - 145 mmol/L   Potassium 3.9 3.5 - 5.1 mmol/L   Chloride 103 98 - 111 mmol/L   CO2 21 (L) 22 - 32 mmol/L   Glucose, Bld 272 (H) 70 - 99 mg/dL    Comment: Glucose reference range applies only to samples taken after fasting for at least 8 hours.   BUN 20 8 - 23 mg/dL   Creatinine, Ser 4.09 (H) 0.44 - 1.00 mg/dL   Calcium 8.6 (L) 8.9 - 10.3 mg/dL   Total Protein 6.0 (L) 6.5 - 8.1 g/dL   Albumin 3.4 (L) 3.5 - 5.0 g/dL   AST 31 15 - 41 U/L   ALT 33 0 - 44 U/L   Alkaline Phosphatase 26 (L) 38 - 126 U/L   Total Bilirubin 0.5 0.3 - 1.2 mg/dL   GFR, Estimated 25 (L) >60 mL/min    Comment: (NOTE) Calculated using the CKD-EPI Creatinine Equation (2021)    Anion gap 10 5 - 15    Comment: Performed at [facility], [address] [place], [place] [address]  CBC     Status: Abnormal   Collection Time: 12/19/22  5:33 PM  Result Value Ref Range   WBC 11.5 (H) 4.0 - 10.5 K/uL   RBC 3.67 (L) 3.87 - 5.11 MIL/uL   Hemoglobin 11.3 (L) 12.0 - 15.0 g/dL   HCT 47.8 (L) 29.5 - 62.1 %   MCV 94.0 80.0 - 100.0 fL   MCH 30.8 26.0 - 34.0 pg   MCHC 32.8 30.0 - 36.0 g/dL   RDW 30.8 65.7 - 84.6 %   Platelets 286 150 - 400 K/uL   nRBC 0.0 0.0 - 0.2 %    Comment: Performed at [facility], [address], [place], [place] [address]  Ethanol     Status: None   Collection Time: 12/19/22  5:33 PM  Result Value Ref Range   Alcohol, Ethyl (B) <10 <10 mg/dL    Comment: (NOTE) Lowest detectable limit for serum alcohol is 10 mg/dL.  For medical purposes only. Performed at [facility], [address] [place], [place] [address]   Urinalysis, Routine w reflex microscopic -Urine, Clean Catch     Status: Abnormal   Collection Time: 12/19/22  5:33 PM  Result Value Ref Range   Color, Urine YELLOW YELLOW   APPearance CLEAR CLEAR   Specific Gravity, Urine 1.016 1.005 - 1.030   pH 7.0 5.0 - 8.0   Glucose, UA >=500 (A) NEGATIVE mg/dL   Hgb urine dipstick NEGATIVE NEGATIVE   Bilirubin Urine NEGATIVE NEGATIVE   Ketones, ur NEGATIVE NEGATIVE mg/dL   Protein, ur NEGATIVE NEGATIVE mg/dL   Nitrite NEGATIVE NEGATIVE   Leukocytes,Ua NEGATIVE NEGATIVE   RBC / HPF 0-5 0 - 5 RBC/hpf   WBC, UA 0-5 0 - 5 WBC/hpf   Bacteria, UA NONE SEEN NONE SEEN   Squamous Epithelial / HPF 0-5 0 - 5 /HPF    Comment: Performed at [facility], [address] [place], [place] [address]  Protime-INR     Status: None   Collection Time: 12/19/22  5:33 PM  Result Value Ref Range   Prothrombin Time 13.2 11.4 - 15.2 seconds   INR 1.0 0.8 - 1.2    Comment: (NOTE) INR goal varies based on device and disease states. Performed at [facility], [address] [place], [place] [address]   Lactic acid, plasma     Status: Abnormal   Collection Time: 12/19/22  5:40 PM  Result Value Ref Range   Lactic Acid, Venous 2.3 (HH) 0.5 - 1.9 mmol/L    Comment: CRITICAL RESULT CALLED TO, READ BACK BY AND VERIFIED WITH T,WALSTON RN @1924  12/19/22 E,BENTON Performed at [facility], [address] [place], [place] [address]   Sample to Blood Bank     Status: None   Collection Time: 12/19/22  5:40 PM  Result Value Ref Range   Blood Bank Specimen SAMPLE AVAILABLE FOR TESTING    Sample  Expiration      12/22/2022,2359 Performed at [facility], [address], [place], [place] [address]   I-Stat Chem 8, ED     Status: Abnormal   Collection Time: 12/19/22  5:47 PM  Result Value Ref Range   Sodium 135 135 - 145 mmol/L   Potassium 4.1 3.5 - 5.1 mmol/L   Chloride 103 98 - 111 mmol/L   BUN 24 (H) 8 - 23 mg/dL   Creatinine, Ser 4.03 (H) 0.44 - 1.00 mg/dL   Glucose, Bld 474 (H) 70 - 99 mg/dL    Comment: Glucose reference range applies only to samples taken after fasting for at least 8 hours.   Calcium, Ion 1.12 (L) 1.15 - 1.40 mmol/L   TCO2 22 22 - 32 mmol/L   Hemoglobin 11.6 (L) 12.0 - 15.0 g/dL   HCT 25.9 (L) 56.3 - 87.5 %   CT ANGIO NECK W OR WO CONTRAST  Result Date: 12/19/2022 CLINICAL DATA:  Head trauma EXAM: CT ANGIOGRAPHY NECK TECHNIQUE: Multidetector CT imaging of the neck was performed using the standard protocol during bolus administration of intravenous contrast. Multiplanar CT image reconstructions and MIPs were obtained to evaluate the vascular anatomy. Carotid stenosis measurements (when applicable) are obtained utilizing NASCET criteria, using the distal internal carotid diameter as the denominator. RADIATION DOSE REDUCTION: This exam was performed according to the departmental dose-optimization program which includes automated exposure control, adjustment of the mA and/or kV according to patient size and/or use of iterative reconstruction technique. CONTRAST:  75mL OMNIPAQUE IOHEXOL  350 MG/ML SOLN COMPARISON:  None Available. FINDINGS: Aortic arch: Standard branching. Imaged portion shows no evidence of aneurysm or dissection. No significant stenosis of the major arch vessel origins. Right carotid system: No evidence of dissection, stenosis (50% or greater) or occlusion. Left carotid system: No evidence of dissection, stenosis (50% or greater) or occlusion. Vertebral arteries: Left dominant.  Normal. Skeleton: Please refer to dedicated report for cervical spine  CT. Other neck: There is soft tissue edema of the right neck with mild asymmetric expansion of the right sternocleidomastoid, likely indicating mild intramuscular hematoma. Upper chest: Clear IMPRESSION: 1. No arterial injury in the neck. 2. Soft tissue edema of the right neck with mild asymmetric expansion of the right sternocleidomastoid, likely indicating mild intramuscular hematoma. Electronically Signed   By: Deatra Robinson M.D.   On: 12/19/2022 19:37   CT CHEST ABDOMEN PELVIS W CONTRAST  Result Date: 12/19/2022 CLINICAL DATA:  Polytrauma, blunt EXAM: CT CHEST, ABDOMEN, AND PELVIS WITH CONTRAST TECHNIQUE: Multidetector CT imaging of the chest, abdomen and pelvis was performed following the standard protocol during bolus administration of intravenous contrast. RADIATION DOSE REDUCTION: This exam was performed according to the departmental dose-optimization program which includes automated exposure control, adjustment of the mA and/or kV according to patient size and/or use of iterative reconstruction technique. CONTRAST:  75mL OMNIPAQUE IOHEXOL 350 MG/ML SOLN COMPARISON:  CT abdomen pelvis 09/28/2022, CT abdomen pelvis 04/18/2006, CT chest 06/30/2021 FINDINGS: CHEST: Cardiovascular: No aortic injury. The thoracic aorta is normal in caliber. The heart is normal in size. No significant pericardial effusion. Mild atherosclerotic plaque. At least 2 vessel coronary calcification. Mediastinum/Nodes: No pneumomediastinum. No mediastinal hematoma. The esophagus is unremarkable. The thyroid is unremarkable. The central airways are patent. No mediastinal, hilar, or axillary lymphadenopathy. Lungs/Pleura: No focal consolidation. Stable chronic lingular 1.6 x 1.2 cm pulmonary nodule-no further follow-up indicated as likely benign. Stable chronic subpleural right lung 3 mm pulmonary nodule-no further follow-up indicated. No pulmonary mass. No pulmonary contusion or laceration. No pneumatocele formation. No pleural  effusion. No pneumothorax. No hemothorax. Musculoskeletal/Chest wall: No chest wall mass. No acute rib or sternal fracture. No spinal fracture. Chronic superior endplate T12 vertebral body height loss in the setting of a Schmorl node. ABDOMEN / PELVIS: Hepatobiliary: Not enlarged. No focal lesion. No laceration or subcapsular hematoma. Status post cholecystectomy. Dilated common bile duct measuring up to 15 mm with no intrahepatic biliary ductal dilatation. Finding can be seen in the post cholecystectomy setting. No biliary ductal dilatation. Pancreas: Normal pancreatic contour. No main pancreatic duct dilatation. Spleen: Not enlarged. No focal lesion. No laceration, subcapsular hematoma, or vascular injury. Adrenals/Urinary Tract: No nodularity bilaterally. Bilateral kidneys enhance symmetrically. No hydronephrosis. No contusion, laceration, or subcapsular hematoma. No injury to the vascular structures or collecting systems. No hydroureter. The urinary bladder is unremarkable. On delayed imaging, there is no urothelial wall thickening and there are no filling defects in the opacified portions of the bilateral collecting systems or ureters. Stomach/Bowel: No small or large bowel wall thickening or dilatation. Colonic diverticulosis. The appendix is unremarkable. Vasculature/Lymphatics: Moderate atherosclerotic plaque. No abdominal aorta or iliac aneurysm. No active contrast extravasation or pseudoaneurysm. No abdominal, pelvic, inguinal lymphadenopathy. Reproductive: Normal. Other: No simple free fluid ascites. No pneumoperitoneum. No hemoperitoneum. No mesenteric hematoma identified. No organized fluid collection. Musculoskeletal: A right flank 13.8 x 5.5 x7 cm subcutaneus soft tissue hematoma with high density material within the hematoma and blooming noted on delayed imaging. Surrounding subcutaneus soft tissue fat stranding. No acute pelvic  fracture. No spinal fracture.  L1 kyphoplasty. Ports and Devices: None.  IMPRESSION: 1. A right flank 14 x 6 x 7 cm subcutaneus soft tissue hematoma with active extravasation. Surrounding subcutaneus soft tissue fat stranding. 2. No acute intrathoracic, intra-abdominal, intrapelvic traumatic injury. 3. No acute fracture or traumatic malalignment of the thoracic or lumbar spine. These results were called by telephone at the time of interpretation on 12/19/2022 at 7:21 pm to provider [facility] , who verbally acknowledged these results. Electronically Signed   By: Tish Frederickson M.D.   On: 12/19/2022 19:35   DG Humerus Right  Result Date: 12/19/2022 CLINICAL DATA:  Status post fall. EXAM: RIGHT HUMERUS - 2+ VIEW COMPARISON:  None Available. FINDINGS: There is no evidence of fracture or other focal bone lesions. Soft tissues are unremarkable. IMPRESSION: Negative. Electronically Signed   By: Ted Mcalpine M.D.   On: 12/19/2022 19:33   CT HEAD WO CONTRAST  Addendum Date: 12/19/2022   ADDENDUM REPORT: 12/19/2022 19:23 ADDENDUM: Asymmetric right neck musculature with subcutaneus soft tissue edema. Query intramuscular hematoma. Please see separately dictated CT angio neck 12/19/2022. These results were called by telephone at the time of interpretation on 12/19/2022 at 7:21 pm to provider [facility] , who verbally acknowledged these results. Electronically Signed   By: Tish Frederickson M.D.   On: 12/19/2022 19:23   Result Date: 12/19/2022 CLINICAL DATA:  Head trauma, moderate-severe; Facial trauma, blunt; Polytrauma, blunt EXAM: CT HEAD WITHOUT CONTRAST CT MAXILLOFACIAL WITHOUT CONTRAST CT CERVICAL SPINE WITHOUT CONTRAST TECHNIQUE: Multidetector CT imaging of the head, cervical spine, and maxillofacial structures were performed using the standard protocol without intravenous contrast. Multiplanar CT image reconstructions of the cervical spine and maxillofacial structures were also generated. RADIATION DOSE REDUCTION: This exam was performed according to the departmental  dose-optimization program which includes automated exposure control, adjustment of the mA and/or kV according to patient size and/or use of iterative reconstruction technique. COMPARISON:  CT head and C-spine 09/28/2022 FINDINGS: CT HEAD FINDINGS Brain: Left occipital encephalomalacia. No evidence of large-territorial acute infarction. No parenchymal hemorrhage. No mass lesion. No extra-axial collection. No mass effect or midline shift. No hydrocephalus. Basilar cisterns are patent. Partial empty sella. Vascular: No hyperdense vessel. Skull: No acute fracture or focal lesion. Other: Right frontal scalp hematoma measuring up to 4 mm. CT MAXILLOFACIAL FINDINGS Osseous: No fracture or mandibular dislocation. No destructive process. Sinuses/Orbits: Paranasal sinuses and mastoid air cells are clear. The orbits are unremarkable. Soft tissues: Right periorbital subcutaneus soft tissue edema and emphysema with overlying soft tissue defect of the nasal bridge and medial right periorbital region. CT CERVICAL SPINE FINDINGS Alignment: Stable grade 1 anterolisthesis of C3 on C4. Skull base and vertebrae: Interval development of an age-indeterminate, possibly acute, minimally displaced triangular fracture of the C6 anterior inferior endplate (8:37). Multilevel degenerative changes of the spine with posterior disc osteophyte complex formation at the C5-C6 and C6-C7 levels. Multilevel disc bulges. Ligamentum flavum calcification. No associated severe osseous neural foraminal or central canal stenosis. No aggressive appearing focal osseous lesion or focal pathologic process. Soft tissues and spinal canal: No prevertebral fluid or swelling. No visible canal hematoma. Upper chest: Unremarkable. Other: None. IMPRESSION: 1. No acute intracranial abnormality. 2.  No acute displaced facial fracture. 3. Suggestion of an acute teardrop fracture of the C6 anterior inferior endplate. Recommend MRI for further evaluation. 4. A 4 mm right  frontal scalp hematoma with soft tissue defect of the nasal bridge and medial right periorbital region. Electronically Signed: By:  Tish Frederickson M.D. On: 12/19/2022 19:13   CT MAXILLOFACIAL WO CONTRAST  Addendum Date: 12/19/2022   ADDENDUM REPORT: 12/19/2022 19:23 ADDENDUM: Asymmetric right neck musculature with subcutaneus soft tissue edema. Query intramuscular hematoma. Please see separately dictated CT angio neck 12/19/2022. These results were called by telephone at the time of interpretation on 12/19/2022 at 7:21 pm to provider [facility] , who verbally acknowledged these results. Electronically Signed   By: Tish Frederickson M.D.   On: 12/19/2022 19:23   Result Date: 12/19/2022 CLINICAL DATA:  Head trauma, moderate-severe; Facial trauma, blunt; Polytrauma, blunt EXAM: CT HEAD WITHOUT CONTRAST CT MAXILLOFACIAL WITHOUT CONTRAST CT CERVICAL SPINE WITHOUT CONTRAST TECHNIQUE: Multidetector CT imaging of the head, cervical spine, and maxillofacial structures were performed using the standard protocol without intravenous contrast. Multiplanar CT image reconstructions of the cervical spine and maxillofacial structures were also generated. RADIATION DOSE REDUCTION: This exam was performed according to the departmental dose-optimization program which includes automated exposure control, adjustment of the mA and/or kV according to patient size and/or use of iterative reconstruction technique. COMPARISON:  CT head and C-spine 09/28/2022 FINDINGS: CT HEAD FINDINGS Brain: Left occipital encephalomalacia. No evidence of large-territorial acute infarction. No parenchymal hemorrhage. No mass lesion. No extra-axial collection. No mass effect or midline shift. No hydrocephalus. Basilar cisterns are patent. Partial empty sella. Vascular: No hyperdense vessel. Skull: No acute fracture or focal lesion. Other: Right frontal scalp hematoma measuring up to 4 mm. CT MAXILLOFACIAL FINDINGS Osseous: No fracture or mandibular  dislocation. No destructive process. Sinuses/Orbits: Paranasal sinuses and mastoid air cells are clear. The orbits are unremarkable. Soft tissues: Right periorbital subcutaneus soft tissue edema and emphysema with overlying soft tissue defect of the nasal bridge and medial right periorbital region. CT CERVICAL SPINE FINDINGS Alignment: Stable grade 1 anterolisthesis of C3 on C4. Skull base and vertebrae: Interval development of an age-indeterminate, possibly acute, minimally displaced triangular fracture of the C6 anterior inferior endplate (8:37). Multilevel degenerative changes of the spine with posterior disc osteophyte complex formation at the C5-C6 and C6-C7 levels. Multilevel disc bulges. Ligamentum flavum calcification. No associated severe osseous neural foraminal or central canal stenosis. No aggressive appearing focal osseous lesion or focal pathologic process. Soft tissues and spinal canal: No prevertebral fluid or swelling. No visible canal hematoma. Upper chest: Unremarkable. Other: None. IMPRESSION: 1. No acute intracranial abnormality. 2.  No acute displaced facial fracture. 3. Suggestion of an acute teardrop fracture of the C6 anterior inferior endplate. Recommend MRI for further evaluation. 4. A 4 mm right frontal scalp hematoma with soft tissue defect of the nasal bridge and medial right periorbital region. Electronically Signed: By: Tish Frederickson M.D. On: 12/19/2022 19:13   CT CERVICAL SPINE WO CONTRAST  Addendum Date: 12/19/2022   ADDENDUM REPORT: 12/19/2022 19:23 ADDENDUM: Asymmetric right neck musculature with subcutaneus soft tissue edema. Query intramuscular hematoma. Please see separately dictated CT angio neck 12/19/2022. These results were called by telephone at the time of interpretation on 12/19/2022 at 7:21 pm to provider [facility] , who verbally acknowledged these results. Electronically Signed   By: Tish Frederickson M.D.   On: 12/19/2022 19:23   Result Date:  12/19/2022 CLINICAL DATA:  Head trauma, moderate-severe; Facial trauma, blunt; Polytrauma, blunt EXAM: CT HEAD WITHOUT CONTRAST CT MAXILLOFACIAL WITHOUT CONTRAST CT CERVICAL SPINE WITHOUT CONTRAST TECHNIQUE: Multidetector CT imaging of the head, cervical spine, and maxillofacial structures were performed using the standard protocol without intravenous contrast. Multiplanar CT image reconstructions of the cervical spine and maxillofacial structures  were also generated. RADIATION DOSE REDUCTION: This exam was performed according to the departmental dose-optimization program which includes automated exposure control, adjustment of the mA and/or kV according to patient size and/or use of iterative reconstruction technique. COMPARISON:  CT head and C-spine 09/28/2022 FINDINGS: CT HEAD FINDINGS Brain: Left occipital encephalomalacia. No evidence of large-territorial acute infarction. No parenchymal hemorrhage. No mass lesion. No extra-axial collection. No mass effect or midline shift. No hydrocephalus. Basilar cisterns are patent. Partial empty sella. Vascular: No hyperdense vessel. Skull: No acute fracture or focal lesion. Other: Right frontal scalp hematoma measuring up to 4 mm. CT MAXILLOFACIAL FINDINGS Osseous: No fracture or mandibular dislocation. No destructive process. Sinuses/Orbits: Paranasal sinuses and mastoid air cells are clear. The orbits are unremarkable. Soft tissues: Right periorbital subcutaneus soft tissue edema and emphysema with overlying soft tissue defect of the nasal bridge and medial right periorbital region. CT CERVICAL SPINE FINDINGS Alignment: Stable grade 1 anterolisthesis of C3 on C4. Skull base and vertebrae: Interval development of an age-indeterminate, possibly acute, minimally displaced triangular fracture of the C6 anterior inferior endplate (8:37). Multilevel degenerative changes of the spine with posterior disc osteophyte complex formation at the C5-C6 and C6-C7 levels. Multilevel  disc bulges. Ligamentum flavum calcification. No associated severe osseous neural foraminal or central canal stenosis. No aggressive appearing focal osseous lesion or focal pathologic process. Soft tissues and spinal canal: No prevertebral fluid or swelling. No visible canal hematoma. Upper chest: Unremarkable. Other: None. IMPRESSION: 1. No acute intracranial abnormality. 2.  No acute displaced facial fracture. 3. Suggestion of an acute teardrop fracture of the C6 anterior inferior endplate. Recommend MRI for further evaluation. 4. A 4 mm right frontal scalp hematoma with soft tissue defect of the nasal bridge and medial right periorbital region. Electronically Signed: By: Tish Frederickson M.D. On: 12/19/2022 19:13   DG Femur Min 2 Views Right  Result Date: 12/19/2022 CLINICAL DATA:  Fall down stairs. EXAM: RIGHT FEMUR 2 VIEWS COMPARISON:  None Available. FINDINGS: There is no evidence of fracture or other focal bone lesions. Cortical margins of the femur are intact. Hip and knee alignment are maintained. Soft tissues are unremarkable. IMPRESSION: No fracture of the right femur. Electronically Signed   By: Narda Rutherford M.D.   On: 12/19/2022 18:27   DG Shoulder Right  Result Date: 12/19/2022 CLINICAL DATA:  Fall down stairs. EXAM: RIGHT SHOULDER - 2+ VIEW COMPARISON:  None Available. FINDINGS: There is no evidence of fracture or dislocation. There is no evidence of arthropathy or other focal bone abnormality. Soft tissues are unremarkable. IMPRESSION: No fracture or dislocation of the right shoulder. Electronically Signed   By: Narda Rutherford M.D.   On: 12/19/2022 18:26   DG Pelvis Portable  Result Date: 12/19/2022 CLINICAL DATA:  Trauma, fall down stairs. EXAM: PORTABLE PELVIS 1-2 VIEWS COMPARISON:  None Available. FINDINGS: Please note that the inferior pubic rami and portions of the proximal femurs are not included in the field of view. No fracture of the included pelvis. No pubic symphyseal or  sacroiliac diastasis. Femoral heads are seated in the acetabulum. IMPRESSION: No fracture of the pelvis, lower aspect excluded from the field of view. CT is planned. Electronically Signed   By: Narda Rutherford M.D.   On: 12/19/2022 18:25   DG Chest Port 1 View  Result Date: 12/19/2022 CLINICAL DATA:  Trauma, fall down stairs. EXAM: PORTABLE CHEST 1 VIEW COMPARISON:  Radiograph 09/28/2022 FINDINGS: The cardiomediastinal contours are normal. The lungs are clear. Pulmonary vasculature is  normal. No consolidation, pleural effusion, or pneumothorax. Stable lingular nodule. No acute osseous abnormalities are seen. IMPRESSION: 1. No acute chest findings. 2. Stable lingular nodule. Electronically Signed   By: Narda Rutherford M.D.   On: 12/19/2022 18:24    Pending Labs Unresulted Labs (From admission, onward)     Start     Ordered   12/20/22 0500  CBC  Tomorrow morning,   R        12/19/22 2044   12/20/22 0500  Basic metabolic panel  Tomorrow morning,   R        12/19/22 2044            Vitals/Pain Today's Vitals   12/19/22 2150 12/19/22 2200 12/19/22 2201 12/19/22 2203  BP: (!) 81/46 (!) 86/48  (!) 99/52  Pulse: 82 79 79 83  Resp: (!) 21 (!) 21 20 19   Temp:      TempSrc:      SpO2: 92% 100% 98% 98%  Weight:      Height:      PainSc:        Isolation Precautions No active isolations  Medications Medications  sodium chloride 0.9 % bolus 1,000 mL (0 mLs Intravenous Stopped 12/19/22 1922)    And  0.9 %  sodium chloride infusion ( Intravenous New Bag/Given 12/19/22 1800)  acetaminophen (TYLENOL) tablet 1,000 mg (1,000 mg Oral Not Given 12/19/22 2129)  methocarbamol (ROBAXIN) tablet 500 mg ( Oral See Alternative 12/19/22 2129)    Or  methocarbamol (ROBAXIN) 500 mg in dextrose 5 % 50 mL IVPB (500 mg Intravenous New Bag/Given 12/19/22 2129)  docusate sodium (COLACE) capsule 100 mg (has no administration in time range)  polyethylene glycol (MIRALAX / GLYCOLAX) packet 17 g (has no  administration in time range)  ondansetron (ZOFRAN-ODT) disintegrating tablet 4 mg (has no administration in time range)    Or  ondansetron (ZOFRAN) injection 4 mg (has no administration in time range)  metoprolol tartrate (LOPRESSOR) injection 5 mg (has no administration in time range)  hydrALAZINE (APRESOLINE) injection 10 mg (has no administration in time range)  0.9 %  sodium chloride infusion ( Intravenous Rate/Dose Change 12/19/22 2120)  oxyCODONE (Oxy IR/ROXICODONE) immediate release tablet 2.5 mg (has no administration in time range)  HYDROmorphone (DILAUDID) injection 0.5 mg (0.5 mg Intravenous Given 12/19/22 2126)  insulin aspart (novoLOG) injection 0-15 Units (has no administration in time range)  levothyroxine (SYNTHROID) tablet 50 mcg (has no administration in time range)  empagliflozin (JARDIANCE) tablet 10 mg (has no administration in time range)  sodium chloride 0.9 % bolus 500 mL (has no administration in time range)  ceFAZolin (ANCEF) IVPB 2g/100 mL premix (0 g Intravenous Stopped 12/19/22 1922)  fentaNYL (SUBLIMAZE) injection 50 mcg (50 mcg Intravenous Given 12/19/22 1758)  Tdap (BOOSTRIX) injection 0.5 mL (0.5 mLs Intramuscular Given 12/19/22 1801)  lidocaine-EPINEPHrine (XYLOCAINE W/EPI) 2 %-1:200000 (PF) injection 20 mL (20 mLs Infiltration Given 12/19/22 1758)  iohexol (OMNIPAQUE) 350 MG/ML injection 75 mL (75 mLs Intravenous Contrast Given 12/19/22 1850)  lidocaine-EPINEPHrine-tetracaine (LET) topical gel (3 mLs Topical Given 12/19/22 1921)  fentaNYL (SUBLIMAZE) injection 50 mcg (50 mcg Intravenous Given 12/19/22 1921)  ondansetron (ZOFRAN) injection 4 mg (4 mg Intravenous Given 12/19/22 1920)  ondansetron (ZOFRAN) injection 4 mg (4 mg Intravenous Given 12/19/22 1957)  prochlorperazine (COMPAZINE) injection 5 mg (5 mg Intravenous Given 12/19/22 2045)    Mobility non-ambulatory     Focused Assessments    R Recommendations: See Admitting Provider Note  Report  given to:    Additional Notes:

## 2022-12-19 NOTE — ED Notes (Signed)
Patient transported to CT with primary RN. 

## 2022-12-19 NOTE — H&P (Signed)
Activation and Reason: level II, fall down stairs  Primary Survey: airway intact, breath sounds present bilaterally, pulses intact  AM…LIE DEAN is an 72 y.o. female.  HPI: 72 yo female with blindness was reaching for the banister and missed and fell down the stairs. She complains of pain in her right side and forehead.  Past Medical History:  Diagnosis Date   Anemia    Anxiety    CKD (chronic kidney disease)    CKD III per PCP notes-pt denies   Congenital blindness    Diabetes mellitus    Diverticula of small intestine    Esophageal stricture    GERD (gastroesophageal reflux disease)    H/O hiatal hernia    Hyperlipidemia    Hypertension    Hypothyroidism    Iron deficiency anemia    Osteopenia    Osteoporosis    Postoperative nausea and vomiting    Retinitis    STEMI (ST elevation myocardial infarction) (HCC)    SVT (supraventricular tachycardia)    Vitamin B 12 deficiency     Past Surgical History:  Procedure Laterality Date   BACK SURGERY     CARPAL TUNNEL RELEASE     Right    CESAREAN SECTION     CHOLECYSTECTOMY  07/27/1983   COLONOSCOPY     CORONARY BALLOON ANGIOPLASTY N/A 12/30/2019   Procedure: CORONARY BALLOON ANGIOPLASTY;  Surgeon: Lennette Bihari, MD;  Location: [facility];  Service: Cardiovascular;  Laterality: N/A;   CORONARY/GRAFT ACUTE MI REVASCULARIZATION N/A 12/30/2019   Procedure: Coronary/Graft Acute MI Revascularization;  Surgeon: Lennette Bihari, MD;  Location: [facility];  Service: Cardiovascular;  Laterality: N/A;   HERNIA REPAIR     pt denies   LEFT HEART CATH AND CORONARY ANGIOGRAPHY N/A 12/30/2019   Procedure: LEFT HEART CATH AND CORONARY ANGIOGRAPHY;  Surgeon: Lennette Bihari, MD;  Location: [facility];  Service: Cardiovascular;  Laterality: N/A;   Right knee arthroscopy  07/27/1991   Right thyroidectomy  07/26/2005   TOTAL ABDOMINAL HYSTERECTOMY W/ BILATERAL SALPINGOOPHORECTOMY  07/27/1991   UPPER  GASTROINTESTINAL ENDOSCOPY      Family History  Problem Relation Age of Onset   Coronary artery disease Father    COPD Father    CVA Mother    Diabetes Mellitus II Mother    Heart disease Mother    Diabetes Mellitus II Maternal Grandmother    Uterine cancer Sister    Hypertension Sister    Hypertension Sister        Brother also   Hyperlipidemia Sister        Brother also   Colon cancer Neg Hx    Rectal cancer Neg Hx    Stomach cancer Neg Hx    Esophageal cancer Neg Hx    Hypertension Mother    Hypertension Father     Social History:  reports that she has never smoked. She has never used smokeless tobacco. She reports that she does not drink alcohol and does not use drugs.  Allergies:  Allergies  Allergen Reactions   Codeine Shortness Of Breath and Nausea And Vomiting   Erythromycin Nausea And Vomiting   Levaquin [Levofloxacin] Shortness Of Breath   Asa [Aspirin] Other (See Comments)    Pt told to avoid aspirin due to her heart.   Motrin [Ibuprofen] Other (See Comments)    Pt told to avoid ibuprofen due to her heart.   Ultram [Tramadol] Nausea And Vomiting  Medications: I have reviewed the patient's current medications.  Results for orders placed or performed during the hospital encounter of 12/19/22 (from the past 48 hour(s))  Comprehensive metabolic panel     Status: Abnormal   Collection Time: 12/19/22  5:33 PM  Result Value Ref Range   Sodium 134 (L) 135 - 145 mmol/L   Potassium 3.9 3.5 - 5.1 mmol/L   Chloride 103 98 - 111 mmol/L   CO2 21 (L) 22 - 32 mmol/L   Glucose, Bld 272 (H) 70 - 99 mg/dL    Comment: Glucose reference range applies only to samples taken after fasting for at least 8 hours.   BUN 20 8 - 23 mg/dL   Creatinine, Ser 0.86 (H) 0.44 - 1.00 mg/dL   Calcium 8.6 (L) 8.9 - 10.3 mg/dL   Total Protein 6.0 (L) 6.5 - 8.1 g/dL   Albumin 3.4 (L) 3.5 - 5.0 g/dL   AST 31 15 - 41 U/L   ALT 33 0 - 44 U/L   Alkaline Phosphatase 26 (L) 38 - 126 U/L    Total Bilirubin 0.5 0.3 - 1.2 mg/dL   GFR, Estimated 25 (L) >60 mL/min    Comment: (NOTE) Calculated using the CKD-EPI Creatinine Equation (2021)    Anion gap 10 5 - 15    Comment: Performed at [facility], [address], [place], [place] [address]  CBC     Status: Abnormal   Collection Time: 12/19/22  5:33 PM  Result Value Ref Range   WBC 11.5 (H) 4.0 - 10.5 K/uL   RBC 3.67 (L) 3.87 - 5.11 MIL/uL   Hemoglobin 11.3 (L) 12.0 - 15.0 g/dL   HCT 96.2 (L) 95.2 - 84.1 %   MCV 94.0 80.0 - 100.0 fL   MCH 30.8 26.0 - 34.0 pg   MCHC 32.8 30.0 - 36.0 g/dL   RDW 32.4 40.1 - 02.7 %   Platelets 286 150 - 400 K/uL   nRBC 0.0 0.0 - 0.2 %    Comment: Performed at [facility], [address] [place], [place] [address]  Ethanol     Status: None   Collection Time: 12/19/22  5:33 PM  Result Value Ref Range   Alcohol, Ethyl (B) <10 <10 mg/dL    Comment: (NOTE) Lowest detectable limit for serum alcohol is 10 mg/dL.  For medical purposes only. Performed at [facility], [address] [place], [place] [address]   Urinalysis, Routine w reflex microscopic -Urine, Clean Catch     Status: Abnormal   Collection Time: 12/19/22  5:33 PM  Result Value Ref Range   Color, Urine YELLOW YELLOW   APPearance CLEAR CLEAR   Specific Gravity, Urine 1.016 1.005 - 1.030   pH 7.0 5.0 - 8.0   Glucose, UA >=500 (A) NEGATIVE mg/dL   Hgb urine dipstick NEGATIVE NEGATIVE   Bilirubin Urine NEGATIVE NEGATIVE   Ketones, ur NEGATIVE NEGATIVE mg/dL   Protein, ur NEGATIVE NEGATIVE mg/dL   Nitrite NEGATIVE NEGATIVE   Leukocytes,Ua NEGATIVE NEGATIVE   RBC / HPF 0-5 0 - 5 RBC/hpf   WBC, UA 0-5 0 - 5 WBC/hpf   Bacteria, UA NONE SEEN NONE SEEN   Squamous Epithelial / HPF 0-5 0 - 5 /HPF    Comment: Performed at [facility], [address], [place], [place] [address]  Protime-INR     Status: None   Collection Time: 12/19/22  5:33 PM  Result Value Ref Range  Prothrombin Time 13.2 11.4 - 15.2  seconds   INR 1.0 0.8 - 1.2    Comment: (NOTE) INR goal varies based on device and disease states. Performed at [facility], [address] [place], [place] [address]   Lactic acid, plasma     Status: Abnormal   Collection Time: 12/19/22  5:40 PM  Result Value Ref Range   Lactic Acid, Venous 2.3 (HH) 0.5 - 1.9 mmol/L    Comment: CRITICAL RESULT CALLED TO, READ BACK BY AND VERIFIED WITH T,WALSTON RN @1924  12/19/22 E,BENTON Performed at [facility], [address], [place], [place] [address]   Sample to Blood Bank     Status: None   Collection Time: 12/19/22  5:40 PM  Result Value Ref Range   Blood Bank Specimen SAMPLE AVAILABLE FOR TESTING    Sample Expiration      12/22/2022,2359 Performed at [facility], [address] [place], [place] [address]   I-Stat Chem 8, ED     Status: Abnormal   Collection Time: 12/19/22  5:47 PM  Result Value Ref Range   Sodium 135 135 - 145 mmol/L   Potassium 4.1 3.5 - 5.1 mmol/L   Chloride 103 98 - 111 mmol/L   BUN 24 (H) 8 - 23 mg/dL   Creatinine, Ser 6.96 (H) 0.44 - 1.00 mg/dL   Glucose, Bld 295 (H) 70 - 99 mg/dL    Comment: Glucose reference range applies only to samples taken after fasting for at least 8 hours.   Calcium, Ion 1.12 (L) 1.15 - 1.40 mmol/L   TCO2 22 22 - 32 mmol/L   Hemoglobin 11.6 (L) 12.0 - 15.0 g/dL   HCT 28.4 (L) 13.2 - 44.0 %    CT ANGIO NECK W OR WO CONTRAST  Result Date: 12/19/2022 CLINICAL DATA:  Head trauma EXAM: CT ANGIOGRAPHY NECK TECHNIQUE: Multidetector CT imaging of the neck was performed using the standard protocol during bolus administration of intravenous contrast. Multiplanar CT image reconstructions and MIPs were obtained to evaluate the vascular anatomy. Carotid stenosis measurements (when applicable) are obtained utilizing NASCET criteria, using the distal internal carotid diameter as the denominator. RADIATION DOSE REDUCTION: This exam was performed according to the departmental  dose-optimization program which includes automated exposure control, adjustment of the mA and/or kV according to patient size and/or use of iterative reconstruction technique. CONTRAST:  75mL OMNIPAQUE IOHEXOL 350 MG/ML SOLN COMPARISON:  None Available. FINDINGS: Aortic arch: Standard branching. Imaged portion shows no evidence of aneurysm or dissection. No significant stenosis of the major arch vessel origins. Right carotid system: No evidence of dissection, stenosis (50% or greater) or occlusion. Left carotid system: No evidence of dissection, stenosis (50% or greater) or occlusion. Vertebral arteries: Left dominant.  Normal. Skeleton: Please refer to dedicated report for cervical spine CT. Other neck: There is soft tissue edema of the right neck with mild asymmetric expansion of the right sternocleidomastoid, likely indicating mild intramuscular hematoma. Upper chest: Clear IMPRESSION: 1. No arterial injury in the neck. 2. Soft tissue edema of the right neck with mild asymmetric expansion of the right sternocleidomastoid, likely indicating mild intramuscular hematoma. Electronically Signed   By: Deatra Robinson M.D.   On: 12/19/2022 19:37   CT CHEST ABDOMEN PELVIS W CONTRAST  Result Date: 12/19/2022 CLINICAL DATA:  Polytrauma, blunt EXAM: CT CHEST, ABDOMEN, AND PELVIS WITH CONTRAST TECHNIQUE: Multidetector CT imaging of the chest, abdomen and pelvis was performed following the standard protocol during bolus administration  of intravenous contrast. RADIATION DOSE REDUCTION: This exam was performed according to the departmental dose-optimization program which includes automated exposure control, adjustment of the mA and/or kV according to patient size and/or use of iterative reconstruction technique. CONTRAST:  75mL OMNIPAQUE IOHEXOL 350 MG/ML SOLN COMPARISON:  CT abdomen pelvis 09/28/2022, CT abdomen pelvis 04/18/2006, CT chest 06/30/2021 FINDINGS: CHEST: Cardiovascular: No aortic injury. The thoracic aorta is  normal in caliber. The heart is normal in size. No significant pericardial effusion. Mild atherosclerotic plaque. At least 2 vessel coronary calcification. Mediastinum/Nodes: No pneumomediastinum. No mediastinal hematoma. The esophagus is unremarkable. The thyroid is unremarkable. The central airways are patent. No mediastinal, hilar, or axillary lymphadenopathy. Lungs/Pleura: No focal consolidation. Stable chronic lingular 1.6 x 1.2 cm pulmonary nodule-no further follow-up indicated as likely benign. Stable chronic subpleural right lung 3 mm pulmonary nodule-no further follow-up indicated. No pulmonary mass. No pulmonary contusion or laceration. No pneumatocele formation. No pleural effusion. No pneumothorax. No hemothorax. Musculoskeletal/Chest wall: No chest wall mass. No acute rib or sternal fracture. No spinal fracture. Chronic superior endplate T12 vertebral body height loss in the setting of a Schmorl node. ABDOMEN / PELVIS: Hepatobiliary: Not enlarged. No focal lesion. No laceration or subcapsular hematoma. Status post cholecystectomy. Dilated common bile duct measuring up to 15 mm with no intrahepatic biliary ductal dilatation. Finding can be seen in the post cholecystectomy setting. No biliary ductal dilatation. Pancreas: Normal pancreatic contour. No main pancreatic duct dilatation. Spleen: Not enlarged. No focal lesion. No laceration, subcapsular hematoma, or vascular injury. Adrenals/Urinary Tract: No nodularity bilaterally. Bilateral kidneys enhance symmetrically. No hydronephrosis. No contusion, laceration, or subcapsular hematoma. No injury to the vascular structures or collecting systems. No hydroureter. The urinary bladder is unremarkable. On delayed imaging, there is no urothelial wall thickening and there are no filling defects in the opacified portions of the bilateral collecting systems or ureters. Stomach/Bowel: No small or large bowel wall thickening or dilatation. Colonic diverticulosis. The  appendix is unremarkable. Vasculature/Lymphatics: Moderate atherosclerotic plaque. No abdominal aorta or iliac aneurysm. No active contrast extravasation or pseudoaneurysm. No abdominal, pelvic, inguinal lymphadenopathy. Reproductive: Normal. Other: No simple free fluid ascites. No pneumoperitoneum. No hemoperitoneum. No mesenteric hematoma identified. No organized fluid collection. Musculoskeletal: A right flank 13.8 x 5.5 x7 cm subcutaneus soft tissue hematoma with high density material within the hematoma and blooming noted on delayed imaging. Surrounding subcutaneus soft tissue fat stranding. No acute pelvic fracture. No spinal fracture.  L1 kyphoplasty. Ports and Devices: None. IMPRESSION: 1. A right flank 14 x 6 x 7 cm subcutaneus soft tissue hematoma with active extravasation. Surrounding subcutaneus soft tissue fat stranding. 2. No acute intrathoracic, intra-abdominal, intrapelvic traumatic injury. 3. No acute fracture or traumatic malalignment of the thoracic or lumbar spine. These results were called by telephone at the time of interpretation on 12/19/2022 at 7:21 pm to provider [facility] , who verbally acknowledged these results. Electronically Signed   By: Tish Frederickson M.D.   On: 12/19/2022 19:35   DG Humerus Right  Result Date: 12/19/2022 CLINICAL DATA:  Status post fall. EXAM: RIGHT HUMERUS - 2+ VIEW COMPARISON:  None Available. FINDINGS: There is no evidence of fracture or other focal bone lesions. Soft tissues are unremarkable. IMPRESSION: Negative. Electronically Signed   By: Ted Mcalpine M.D.   On: 12/19/2022 19:33   CT HEAD WO CONTRAST  Addendum Date: 12/19/2022   ADDENDUM REPORT: 12/19/2022 19:23 ADDENDUM: Asymmetric right neck musculature with subcutaneus soft tissue edema. Query intramuscular hematoma. Please see separately dictated  CT angio neck 12/19/2022. These results were called by telephone at the time of interpretation on 12/19/2022 at 7:21 pm to provider [facility] , who verbally acknowledged these results. Electronically Signed   By: Tish Frederickson M.D.   On: 12/19/2022 19:23   Result Date: 12/19/2022 CLINICAL DATA:  Head trauma, moderate-severe; Facial trauma, blunt; Polytrauma, blunt EXAM: CT HEAD WITHOUT CONTRAST CT MAXILLOFACIAL WITHOUT CONTRAST CT CERVICAL SPINE WITHOUT CONTRAST TECHNIQUE: Multidetector CT imaging of the head, cervical spine, and maxillofacial structures were performed using the standard protocol without intravenous contrast. Multiplanar CT image reconstructions of the cervical spine and maxillofacial structures were also generated. RADIATION DOSE REDUCTION: This exam was performed according to the departmental dose-optimization program which includes automated exposure control, adjustment of the mA and/or kV according to patient size and/or use of iterative reconstruction technique. COMPARISON:  CT head and C-spine 09/28/2022 FINDINGS: CT HEAD FINDINGS Brain: Left occipital encephalomalacia. No evidence of large-territorial acute infarction. No parenchymal hemorrhage. No mass lesion. No extra-axial collection. No mass effect or midline shift. No hydrocephalus. Basilar cisterns are patent. Partial empty sella. Vascular: No hyperdense vessel. Skull: No acute fracture or focal lesion. Other: Right frontal scalp hematoma measuring up to 4 mm. CT MAXILLOFACIAL FINDINGS Osseous: No fracture or mandibular dislocation. No destructive process. Sinuses/Orbits: Paranasal sinuses and mastoid air cells are clear. The orbits are unremarkable. Soft tissues: Right periorbital subcutaneus soft tissue edema and emphysema with overlying soft tissue defect of the nasal bridge and medial right periorbital region. CT CERVICAL SPINE FINDINGS Alignment: Stable grade 1 anterolisthesis of C3 on C4. Skull base and vertebrae: Interval development of an age-indeterminate, possibly acute, minimally displaced triangular fracture of the C6 anterior inferior endplate (8:37).  Multilevel degenerative changes of the spine with posterior disc osteophyte complex formation at the C5-C6 and C6-C7 levels. Multilevel disc bulges. Ligamentum flavum calcification. No associated severe osseous neural foraminal or central canal stenosis. No aggressive appearing focal osseous lesion or focal pathologic process. Soft tissues and spinal canal: No prevertebral fluid or swelling. No visible canal hematoma. Upper chest: Unremarkable. Other: None. IMPRESSION: 1. No acute intracranial abnormality. 2.  No acute displaced facial fracture. 3. Suggestion of an acute teardrop fracture of the C6 anterior inferior endplate. Recommend MRI for further evaluation. 4. A 4 mm right frontal scalp hematoma with soft tissue defect of the nasal bridge and medial right periorbital region. Electronically Signed: By: Tish Frederickson M.D. On: 12/19/2022 19:13   CT MAXILLOFACIAL WO CONTRAST  Addendum Date: 12/19/2022   ADDENDUM REPORT: 12/19/2022 19:23 ADDENDUM: Asymmetric right neck musculature with subcutaneus soft tissue edema. Query intramuscular hematoma. Please see separately dictated CT angio neck 12/19/2022. These results were called by telephone at the time of interpretation on 12/19/2022 at 7:21 pm to provider [facility] , who verbally acknowledged these results. Electronically Signed   By: Tish Frederickson M.D.   On: 12/19/2022 19:23   Result Date: 12/19/2022 CLINICAL DATA:  Head trauma, moderate-severe; Facial trauma, blunt; Polytrauma, blunt EXAM: CT HEAD WITHOUT CONTRAST CT MAXILLOFACIAL WITHOUT CONTRAST CT CERVICAL SPINE WITHOUT CONTRAST TECHNIQUE: Multidetector CT imaging of the head, cervical spine, and maxillofacial structures were performed using the standard protocol without intravenous contrast. Multiplanar CT image reconstructions of the cervical spine and maxillofacial structures were also generated. RADIATION DOSE REDUCTION: This exam was performed according to the departmental dose-optimization  program which includes automated exposure control, adjustment of the mA and/or kV according to patient size and/or use of iterative reconstruction technique. COMPARISON:  CT  head and C-spine 09/28/2022 FINDINGS: CT HEAD FINDINGS Brain: Left occipital encephalomalacia. No evidence of large-territorial acute infarction. No parenchymal hemorrhage. No mass lesion. No extra-axial collection. No mass effect or midline shift. No hydrocephalus. Basilar cisterns are patent. Partial empty sella. Vascular: No hyperdense vessel. Skull: No acute fracture or focal lesion. Other: Right frontal scalp hematoma measuring up to 4 mm. CT MAXILLOFACIAL FINDINGS Osseous: No fracture or mandibular dislocation. No destructive process. Sinuses/Orbits: Paranasal sinuses and mastoid air cells are clear. The orbits are unremarkable. Soft tissues: Right periorbital subcutaneus soft tissue edema and emphysema with overlying soft tissue defect of the nasal bridge and medial right periorbital region. CT CERVICAL SPINE FINDINGS Alignment: Stable grade 1 anterolisthesis of C3 on C4. Skull base and vertebrae: Interval development of an age-indeterminate, possibly acute, minimally displaced triangular fracture of the C6 anterior inferior endplate (8:37). Multilevel degenerative changes of the spine with posterior disc osteophyte complex formation at the C5-C6 and C6-C7 levels. Multilevel disc bulges. Ligamentum flavum calcification. No associated severe osseous neural foraminal or central canal stenosis. No aggressive appearing focal osseous lesion or focal pathologic process. Soft tissues and spinal canal: No prevertebral fluid or swelling. No visible canal hematoma. Upper chest: Unremarkable. Other: None. IMPRESSION: 1. No acute intracranial abnormality. 2.  No acute displaced facial fracture. 3. Suggestion of an acute teardrop fracture of the C6 anterior inferior endplate. Recommend MRI for further evaluation. 4. A 4 mm right frontal scalp hematoma  with soft tissue defect of the nasal bridge and medial right periorbital region. Electronically Signed: By: Tish Frederickson M.D. On: 12/19/2022 19:13   CT CERVICAL SPINE WO CONTRAST  Addendum Date: 12/19/2022   ADDENDUM REPORT: 12/19/2022 19:23 ADDENDUM: Asymmetric right neck musculature with subcutaneus soft tissue edema. Query intramuscular hematoma. Please see separately dictated CT angio neck 12/19/2022. These results were called by telephone at the time of interpretation on 12/19/2022 at 7:21 pm to provider [facility] , who verbally acknowledged these results. Electronically Signed   By: Tish Frederickson M.D.   On: 12/19/2022 19:23   Result Date: 12/19/2022 CLINICAL DATA:  Head trauma, moderate-severe; Facial trauma, blunt; Polytrauma, blunt EXAM: CT HEAD WITHOUT CONTRAST CT MAXILLOFACIAL WITHOUT CONTRAST CT CERVICAL SPINE WITHOUT CONTRAST TECHNIQUE: Multidetector CT imaging of the head, cervical spine, and maxillofacial structures were performed using the standard protocol without intravenous contrast. Multiplanar CT image reconstructions of the cervical spine and maxillofacial structures were also generated. RADIATION DOSE REDUCTION: This exam was performed according to the departmental dose-optimization program which includes automated exposure control, adjustment of the mA and/or kV according to patient size and/or use of iterative reconstruction technique. COMPARISON:  CT head and C-spine 09/28/2022 FINDINGS: CT HEAD FINDINGS Brain: Left occipital encephalomalacia. No evidence of large-territorial acute infarction. No parenchymal hemorrhage. No mass lesion. No extra-axial collection. No mass effect or midline shift. No hydrocephalus. Basilar cisterns are patent. Partial empty sella. Vascular: No hyperdense vessel. Skull: No acute fracture or focal lesion. Other: Right frontal scalp hematoma measuring up to 4 mm. CT MAXILLOFACIAL FINDINGS Osseous: No fracture or mandibular dislocation. No  destructive process. Sinuses/Orbits: Paranasal sinuses and mastoid air cells are clear. The orbits are unremarkable. Soft tissues: Right periorbital subcutaneus soft tissue edema and emphysema with overlying soft tissue defect of the nasal bridge and medial right periorbital region. CT CERVICAL SPINE FINDINGS Alignment: Stable grade 1 anterolisthesis of C3 on C4. Skull base and vertebrae: Interval development of an age-indeterminate, possibly acute, minimally displaced triangular fracture of the C6 anterior inferior endplate (  8:37). Multilevel degenerative changes of the spine with posterior disc osteophyte complex formation at the C5-C6 and C6-C7 levels. Multilevel disc bulges. Ligamentum flavum calcification. No associated severe osseous neural foraminal or central canal stenosis. No aggressive appearing focal osseous lesion or focal pathologic process. Soft tissues and spinal canal: No prevertebral fluid or swelling. No visible canal hematoma. Upper chest: Unremarkable. Other: None. IMPRESSION: 1. No acute intracranial abnormality. 2.  No acute displaced facial fracture. 3. Suggestion of an acute teardrop fracture of the C6 anterior inferior endplate. Recommend MRI for further evaluation. 4. A 4 mm right frontal scalp hematoma with soft tissue defect of the nasal bridge and medial right periorbital region. Electronically Signed: By: Tish Frederickson M.D. On: 12/19/2022 19:13   DG Femur Min 2 Views Right  Result Date: 12/19/2022 CLINICAL DATA:  Fall down stairs. EXAM: RIGHT FEMUR 2 VIEWS COMPARISON:  None Available. FINDINGS: There is no evidence of fracture or other focal bone lesions. Cortical margins of the femur are intact. Hip and knee alignment are maintained. Soft tissues are unremarkable. IMPRESSION: No fracture of the right femur. Electronically Signed   By: Narda Rutherford M.D.   On: 12/19/2022 18:27   DG Shoulder Right  Result Date: 12/19/2022 CLINICAL DATA:  Fall down stairs. EXAM: RIGHT  SHOULDER - 2+ VIEW COMPARISON:  None Available. FINDINGS: There is no evidence of fracture or dislocation. There is no evidence of arthropathy or other focal bone abnormality. Soft tissues are unremarkable. IMPRESSION: No fracture or dislocation of the right shoulder. Electronically Signed   By: Narda Rutherford M.D.   On: 12/19/2022 18:26   DG Pelvis Portable  Result Date: 12/19/2022 CLINICAL DATA:  Trauma, fall down stairs. EXAM: PORTABLE PELVIS 1-2 VIEWS COMPARISON:  None Available. FINDINGS: Please note that the inferior pubic rami and portions of the proximal femurs are not included in the field of view. No fracture of the included pelvis. No pubic symphyseal or sacroiliac diastasis. Femoral heads are seated in the acetabulum. IMPRESSION: No fracture of the pelvis, lower aspect excluded from the field of view. CT is planned. Electronically Signed   By: Narda Rutherford M.D.   On: 12/19/2022 18:25   DG Chest Port 1 View  Result Date: 12/19/2022 CLINICAL DATA:  Trauma, fall down stairs. EXAM: PORTABLE CHEST 1 VIEW COMPARISON:  Radiograph 09/28/2022 FINDINGS: The cardiomediastinal contours are normal. The lungs are clear. Pulmonary vasculature is normal. No consolidation, pleural effusion, or pneumothorax. Stable lingular nodule. No acute osseous abnormalities are seen. IMPRESSION: 1. No acute chest findings. 2. Stable lingular nodule. Electronically Signed   By: Narda Rutherford M.D.   On: 12/19/2022 18:24    ROS  PE Blood pressure 108/69, pulse 92, temperature (!) 96.8 F (36 C), temperature source (S) Axillary, resp. rate 19, height 5' (1.524 m), weight 52.2 kg, SpO2 98 %. Constitutional: NAD; conversant; large right flank hematoma, forehead laceration Eyes: Moist conjunctiva; no lid lag; anicteric; PERRL Neck: Trachea midline; no thyromegaly, collar in place Lungs: Normal respiratory effort; no tactile fremitus CV: RRR; no palpable thrills; no pitting edema GI: Abd soft, NT; no palpable  hepatosplenomegaly MSK: unable to assess gait; no clubbing/cyanosis Psychiatric: Appropriate affect; alert and oriented x3 Lymphatic: No palpable cervical or axillary lymphadenopathy   Assessment/Plan: 72 yo female with blindness fall down stairs  R flank hematoma - hold anticoagulation, repeat hemoglobin in am C6 rain drop fracture - follow up with MRI, collar until MRI complete Laceration forehead - EDP to repair  FEN- NPO  VTE- hold prophylaxis due to hematoma ID- no issues Dispo- admit to trauma progressive floor   I reviewed last 24 h vitals and pain scores, last 48 h intake and output, last 24 h labs and trends, and last 24 h imaging results. I reviewed CT scan images showing large right sided hematoma in the subcu with extravasation, no visible vessel going to that area  This care required high  level of medical decision making.   De Blanch Jocilynn Grade 12/19/2022, 9:19 PM

## 2022-12-19 NOTE — Progress Notes (Signed)
Orthopedic Tech Progress Note Patient Details:  [name] April 07, 1951 [id number]  Level II trauma, ortho tech not needed at this time.  Patient ID: Jessica [name], female   DOB: 1951-05-07, 72 y.o.   MRN: [id number]  Docia Furl 12/19/2022, 6:00 PM

## 2022-12-19 NOTE — ED Notes (Signed)
Pt's daughter April leaving at this time.  Daughter's number in chart; states staff can call her if needed.

## 2022-12-20 ENCOUNTER — Other Ambulatory Visit: Payer: Self-pay

## 2022-12-20 LAB — CBC
HCT: 24.6 % — ABNORMAL LOW (ref 36.0–46.0)
HCT: 27.2 % — ABNORMAL LOW (ref 36.0–46.0)
Hemoglobin: 8 g/dL — ABNORMAL LOW (ref 12.0–15.0)
Hemoglobin: 9 g/dL — ABNORMAL LOW (ref 12.0–15.0)
MCH: 31.6 pg (ref 26.0–34.0)
MCH: 30.5 pg (ref 26.0–34.0)
MCHC: 32.5 g/dL (ref 30.0–36.0)
MCHC: 33.1 g/dL (ref 30.0–36.0)
MCV: 97.2 fL (ref 80.0–100.0)
MCV: 92.2 fL (ref 80.0–100.0)
Platelets: 183 10*3/uL (ref 150–400)
Platelets: 176 10*3/uL (ref 150–400)
RBC: 2.53 MIL/uL — ABNORMAL LOW (ref 3.87–5.11)
RBC: 2.95 MIL/uL — ABNORMAL LOW (ref 3.87–5.11)
RDW: 13.1 % (ref 11.5–15.5)
RDW: 16.5 % — ABNORMAL HIGH (ref 11.5–15.5)
WBC: 18.9 10*3/uL — ABNORMAL HIGH (ref 4.0–10.5)
WBC: 13.8 10*3/uL — ABNORMAL HIGH (ref 4.0–10.5)
nRBC: 0 % (ref 0.0–0.2)
nRBC: 0 % (ref 0.0–0.2)

## 2022-12-20 LAB — BASIC METABOLIC PANEL
Anion gap: 8 (ref 5–15)
BUN: 23 mg/dL (ref 8–23)
CO2: 16 mmol/L — ABNORMAL LOW (ref 22–32)
Calcium: 6.7 mg/dL — ABNORMAL LOW (ref 8.9–10.3)
Chloride: 110 mmol/L (ref 98–111)
Creatinine, Ser: 1.85 mg/dL — ABNORMAL HIGH (ref 0.44–1.00)
GFR, Estimated: 28 mL/min — ABNORMAL LOW (ref >60)
Glucose, Bld: 263 mg/dL — ABNORMAL HIGH (ref 70–99)
Potassium: 4.8 mmol/L (ref 3.5–5.1)
Sodium: 134 mmol/L — ABNORMAL LOW (ref 135–145)

## 2022-12-20 LAB — GLUCOSE, CAPILLARY
Glucose-Capillary: 245 mg/dL — ABNORMAL HIGH (ref 70–99)
Glucose-Capillary: 159 mg/dL — ABNORMAL HIGH (ref 70–99)
Glucose-Capillary: 154 mg/dL — ABNORMAL HIGH (ref 70–99)

## 2022-12-20 LAB — PREPARE RBC (CROSSMATCH)

## 2022-12-20 LAB — TYPE AND SCREEN
Antibody Screen: NEGATIVE
Unit division: 0

## 2022-12-20 MED ORDER — PANTOPRAZOLE SODIUM 40 MG PO TBEC
40.0000 mg | DELAYED_RELEASE_TABLET | Freq: Every day | ORAL | Status: DC
  Administered 2022-12-20 – 2022-12-22 (×3): 40 mg via ORAL
  Filled 2022-12-20 (×3): qty 1

## 2022-12-20 MED ORDER — ORAL CARE MOUTH RINSE: 15.0000 mL | OROMUCOSAL | Status: DC | PRN

## 2022-12-20 MED ORDER — SODIUM CHLORIDE 0.9% IV SOLUTION: Freq: Once | INTRAVENOUS | Status: AC

## 2022-12-20 MED ORDER — SODIUM CHLORIDE 0.9 % IV BOLUS
1000.0000 mL | INTRAVENOUS | Status: AC
  Administered 2022-12-20: 1000 mL via INTRAVENOUS

## 2022-12-20 NOTE — Progress Notes (Signed)
OT Cancellation Note  Patient Details Name: DACY FIGURES MRN: [id number] DOB: October 20, 1950   Cancelled Treatment:    Reason Eval/Treat Not Completed: Other (comment)- RN reports pt getting blood transfusion, requesting OT to hold at this time. Will check back as able.   Barry Brunner, OT Acute Rehabilitation Services Office [phone]   Chancy Milroy 12/20/2022, 9:13 AM

## 2022-12-20 NOTE — Progress Notes (Addendum)
..  Trauma Event Note    Reason for Call : Notified by Primary RN Herbert Seta pt is experiencing persistent hypotension, (see flowsheet) Dr. Sheliah Hatch notified, 1L NS bolus order received and entered. Primary RN updated via [place].  [id number] Notified by primary RN pt continues to be hypotensive after bolus. No change in mental status, Morning labs have not resulted at this time. Dr. Sheliah Hatch aware, no new orders at this time.  TRN to continue to follow.   Last imported Vital Signs BP (!) 99/45   Pulse 84   Temp (!) 97.4 F (36.3 C) (Axillary)   Resp (!) 23   Ht 5' (1.524 m)   Wt 115 lb (52.2 kg)   SpO2 98%   BMI 22.46 kg/m   Trending CBC Recent Labs    12/19/22 1733 12/19/22 1747  WBC 11.5*  --   HGB 11.3* 11.6*  HCT 34.5* 34.0*  PLT 286  --     Trending Coag's Recent Labs    12/19/22 1733  INR 1.0    Trending BMET Recent Labs    12/19/22 1733 12/19/22 1747  NA 134* 135  K 3.9 4.1  CL 103 103  CO2 21*  --   BUN 20 24*  CREATININE 2.03* 2.10*  GLUCOSE 272* 265*      Cyan Moultrie Dee  Trauma Response RN  Please call TRN at [phone] for further assistance.

## 2022-12-20 NOTE — Evaluation (Signed)
Physical Therapy Evaluation Patient Details Name: Jessica [name] MRN: [id number] DOB: 1951-06-27 Today's Date: 12/20/2022  History of Present Illness  Pt is a 72 y/o female presenting after fall down flight of stairs. 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 PMH includes anemia, anxiety, CKD III, congenital blindness, DM, HTN, HLD, STEMI.  Clinical Impression  Patient presents with decreased mobility due to pain, decreased balance and decreased activity tolerance.  VSS during mobility after blood transfusion though pt feeling weak and painful.  She was able to ambulate in hallway with HHA and support for balance.  She previously mobilizes in the home independent.  PT will continue to follow during acute stay.  She may benefit from HHPT at d/c.        Recommendations for follow up therapy are one component of a multi-disciplinary discharge planning process, led by the attending physician.  Recommendations may be updated based on patient status, additional functional criteria and insurance authorization.  Follow Up Recommendations       Assistance Recommended at Discharge Frequent or constant Supervision/Assistance  Patient can return home with the following  A little help with walking and/or transfers;A little help with bathing/dressing/bathroom;Assistance with cooking/housework;Assist for transportation;Help with stairs or ramp for entrance    Equipment Recommendations Rolling walker (2 wheels)  Recommendations for Other Services       Functional Status Assessment Patient has had a recent decline in their functional status and demonstrates the ability to make significant improvements in function in a reasonable and predictable amount of time.     Precautions / Restrictions Precautions Precautions: Fall Precaution Comments: blindness Required Braces or Orthoses: Other Brace Other  Brace: abdominal binder over hematoma on R flank      Mobility  Bed Mobility Overal bed mobility: Needs Assistance Bed Mobility: Rolling, Sidelying to Sit Rolling: Min assist Sidelying to sit: Min assist       General bed mobility comments: cues and assist for technique    Transfers Overall transfer level: Needs assistance Equipment used: 1 person hand held assist Transfers: Sit to/from Stand Sit to Stand: Min assist           General transfer comment: assist for balance/safety    Ambulation/Gait Ambulation/Gait assistance: Min assist Gait Distance (Feet): 90 Feet Assistive device: 1 person hand held assist Gait Pattern/deviations: Step-through pattern, Decreased stride length, Antalgic       General Gait Details: more painful with weight on R leg; HHA for balance  Stairs            Wheelchair Mobility    Modified Rankin (Stroke Patients Only)       Balance Overall balance assessment: Needs assistance   Sitting balance-Leahy Scale: Fair     Standing balance support: Single extremity supported Standing balance-Leahy Scale: Poor Standing balance comment: UE support for balance                             Pertinent Vitals/Pain Pain Assessment Pain Assessment: 0-10 Pain Score: 3  Pain Location: R side (up to 6 with mobility) Pain Descriptors / Indicators: Discomfort Pain Intervention(s): Monitored during session    Home Living Family/patient expects to be discharged to:: Private residence Living Arrangements: Spouse/significant other   Type of Home: House Home Access: Stairs to enter Entrance Stairs-Rails: Can reach both Entrance Stairs-Number of Steps: 4 from deck  and 2 from front Alternate Level Stairs-Number of Steps: storage upstairs for extra supplies Home Layout: Two level;Able to live on main level with bedroom/bathroom Home Equipment: Shower seat;Grab bars - tub/shower;Grab bars - toilet;Cane - quad Additional  Comments: white cane    Prior Function Prior Level of Function : Independent/Modified Independent             Mobility Comments: ambulates without DME inside the home, ambulates with support of spouse in the community ADLs Comments: Ind with ADLs/selfcare, family or friends assist with transportation     Hand Dominance   Dominant Hand: Right    Extremity/Trunk Assessment   Upper Extremity Assessment Upper Extremity Assessment: Defer to OT evaluation    Lower Extremity Assessment Lower Extremity Assessment: Generalized weakness    Cervical / Trunk Assessment Cervical / Trunk Assessment: Other exceptions Cervical / Trunk Exceptions: cervical stiffness; trunk stiffness/pain  Communication   Communication: No difficulties  Cognition Arousal/Alertness: Awake/alert Behavior During Therapy: WFL for tasks assessed/performed Overall Cognitive Status: Within Functional Limits for tasks assessed                                          General Comments General comments (skin integrity, edema, etc.): BP stable after up ambulating    Exercises     Assessment/Plan    PT Assessment Patient needs continued PT services  PT Problem List Decreased balance;Decreased strength;Decreased mobility;Decreased activity tolerance;Pain       PT Treatment Interventions Functional mobility training;Balance training;DME instruction;Gait training;Therapeutic activities;Stair training;Therapeutic exercise;Patient/family education    PT Goals (Current goals can be found in the Care Plan section)  Acute Rehab PT Goals Patient Stated Goal: to return home, to independent PT Goal Formulation: With patient/family Time For Goal Achievement: 01/03/23 Potential to Achieve Goals: Good    Frequency Min 4X/week     Co-evaluation               AM-PAC PT "6 Clicks" Mobility  Outcome Measure Help needed turning from your back to your side while in a flat bed without using  bedrails?: A Little Help needed moving from lying on your back to sitting on the side of a flat bed without using bedrails?: A Little Help needed moving to and from a bed to a chair (including a wheelchair)?: A Little Help needed standing up from a chair using your arms (e.g., wheelchair or bedside chair)?: A Little Help needed to walk in hospital room?: A Little Help needed climbing 3-5 steps with a railing? : Total 6 Click Score: 16    End of Session Equipment Utilized During Treatment: Gait belt Activity Tolerance: Patient tolerated treatment well Patient left: in chair;with call bell/phone within reach;with family/visitor present;with nursing/sitter in room   PT Visit Diagnosis: Other abnormalities of gait and mobility (R26.89);History of falling (Z91.81);Pain Pain - Right/Left: Right Pain - part of body: Hip    Time: 4098-1191 PT Time Calculation (min) (ACUTE ONLY): 26 min   Charges:   PT Evaluation $PT Eval Moderate Complexity: 1 Mod PT Treatments $Gait Training: 8-22 mins        Sheran Lawless, PT Acute Rehabilitation Services Office:[phone] 12/20/2022   Elray Mcgregor 12/20/2022, 4:44 PM

## 2022-12-20 NOTE — TOC CAGE-AID Note (Signed)
Transition of Care [facility]) - CAGE-AID Screening   Patient Details  Name: Jessica [name] MRN: [id number] Date of Birth: 10-Feb-1951     Hewitt Shorts, RN Phone Number: Trauma Response Nurse 12/20/2022, 6:56 PM      CAGE-AID Screening:    Have You Ever Felt You Ought to Cut Down on Your Drinking or Drug Use?: No Have People Annoyed You By [employer] Your Drinking Or Drug Use?: No Have You Felt Bad Or Guilty About Your Drinking Or Drug Use?: No Have You Ever Had a Drink or Used Drugs First Thing In The Morning to Steady Your Nerves or to Get Rid of a Hangover?: No CAGE-AID Score: 0  Substance Abuse Education Offered: No

## 2022-12-20 NOTE — Progress Notes (Signed)
Patient ID: Jessica [name], female   DOB: 04-22-1951, 72 y.o.   MRN: [id number]      Subjective: Sore R flank ROS negative except as listed above. Objective: Vital signs in last 24 hours: Temp:  [96.8 F (36 C)-98.1 F (36.7 C)] 98.1 F (36.7 C) (05/27 0817) Pulse Rate:  [72-98] 92 (05/27 0817) Resp:  [10-24] 20 (05/27 0817) BP: (74-138)/(43-89) 92/64 (05/27 0817) SpO2:  [92 %-100 %] 98 % (05/27 0817) Weight:  [52.2 kg] 52.2 kg (05/26 1801) Last BM Date : 12/18/22 (Per pt)  Intake/Output from previous day: 05/26 0701 - 05/27 0700 In: 820.9 [I.V.:770.9; IV Piggyback:50] Out: -  Intake/Output this shift: No intake/output data recorded.  General appearance: alert and cooperative Head: forehead lac CDI, facial contusions Resp: clear to auscultation bilaterally Cardio: regular rate and rhythm GI: soft, NT, large R flank hematoma Extremities: n oedema  Lab Results: CBC  Recent Labs    12/19/22 1733 12/19/22 1747 12/20/22 0706  WBC 11.5*  --  18.9*  HGB 11.3* 11.6* 8.0*  HCT 34.5* 34.0* 24.6*  PLT 286  --  183   BMET Recent Labs    12/19/22 1733 12/19/22 1747 12/20/22 0706  NA 134* 135 134*  K 3.9 4.1 4.8  CL 103 103 110  CO2 21*  --  16*  GLUCOSE 272* 265* 263*  BUN 20 24* 23  CREATININE 2.03* 2.10* 1.85*  CALCIUM 8.6*  --  6.7*   PT/INR Recent Labs    12/19/22 1733  LABPROT 13.2  INR 1.0   ABG No results for input(s): "PHART", "HCO3" in the last 72 hours.  Invalid input(s): "PCO2", "PO2"  Studies/Results:   Anti-infectives: Anti-infectives (From admission, onward)    Start     Dose/Rate Route Frequency Ordered Stop   12/19/22 1745  ceFAZolin (ANCEF) IVPB 2g/100 mL premix        2 g 200 mL/hr over 30 Minutes Intravenous  Once 12/19/22 1734 12/19/22 1922       Assessment/Plan: 72 yo female with blindness fell down stairs  R flank hematoma - hold anticoagulation, BP soft and Hb down to 8, place binder and give 1u PRBC now, F/U CBC C6  rain drop fracture - follow up with MRI, collar until MRI complete Laceration forehead - EDP to repair DM - SSI ABl anemia - above C Spine MRI neg - remove collar FEN-  carb mod diet VTE- hold prophylaxis due to hematoma ID- no issues Dispo- 4NP, PT/OT   LOS: 1 day    Violeta Gelinas, MD, MPH, FACS Trauma & General Surgery Use [email or web address] to contact on call provider  12/20/2022

## 2022-12-21 LAB — BASIC METABOLIC PANEL
Anion gap: 6 (ref 5–15)
BUN: 21 mg/dL (ref 8–23)
CO2: 18 mmol/L — ABNORMAL LOW (ref 22–32)
Calcium: 6.7 mg/dL — ABNORMAL LOW (ref 8.9–10.3)
Chloride: 113 mmol/L — ABNORMAL HIGH (ref 98–111)
Creatinine, Ser: 1.76 mg/dL — ABNORMAL HIGH (ref 0.44–1.00)
GFR, Estimated: 30 mL/min — ABNORMAL LOW (ref >60)
Glucose, Bld: 125 mg/dL — ABNORMAL HIGH (ref 70–99)
Potassium: 4 mmol/L (ref 3.5–5.1)
Sodium: 137 mmol/L (ref 135–145)

## 2022-12-21 LAB — BPAM RBC
Blood Product Expiration Date: 202405302359
ISSUE DATE / TIME: 202405271009
Unit Type and Rh: 6200

## 2022-12-21 LAB — CBC
HCT: 22.6 % — ABNORMAL LOW (ref 36.0–46.0)
Hemoglobin: 7.6 g/dL — ABNORMAL LOW (ref 12.0–15.0)
MCH: 30.2 pg (ref 26.0–34.0)
MCHC: 33.6 g/dL (ref 30.0–36.0)
MCV: 89.7 fL (ref 80.0–100.0)
Platelets: 160 10*3/uL (ref 150–400)
RBC: 2.52 MIL/uL — ABNORMAL LOW (ref 3.87–5.11)
RDW: 16.9 % — ABNORMAL HIGH (ref 11.5–15.5)
WBC: 10.1 10*3/uL (ref 4.0–10.5)
nRBC: 0 % (ref 0.0–0.2)

## 2022-12-21 LAB — GLUCOSE, CAPILLARY
Glucose-Capillary: 129 mg/dL — ABNORMAL HIGH (ref 70–99)
Glucose-Capillary: 193 mg/dL — ABNORMAL HIGH (ref 70–99)
Glucose-Capillary: 157 mg/dL — ABNORMAL HIGH (ref 70–99)
Glucose-Capillary: 130 mg/dL — ABNORMAL HIGH (ref 70–99)
Glucose-Capillary: 139 mg/dL — ABNORMAL HIGH (ref 70–99)

## 2022-12-21 LAB — TYPE AND SCREEN: ABO/RH(D): A POS

## 2022-12-21 MED ORDER — LACTATED RINGERS IV BOLUS
500.0000 mL | Freq: Once | INTRAVENOUS | Status: AC
  Administered 2022-12-21: 500 mL via INTRAVENOUS

## 2022-12-21 NOTE — Plan of Care (Signed)

## 2022-12-21 NOTE — Evaluation (Addendum)
Occupational Therapy Evaluation Patient Details Name: Jessica [name] MRN: [id number] DOB: [birth year] Today's Date: 12/21/2022   History of Present Illness Pt is a 72 y/o female presenting after fall down flight of stairs. 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 PMH includes anemia, anxiety, CKD III, congenital blindness, DM, HTN, HLD, STEMI.   Clinical Impression   At baseline [name] lives independently with her husband, who is also blind. Pt is doing extremely well and able to ambulate and complete ADL tasks with min A. Pt complaining of abdominal/chest/rib discomfort during mobility however VSS on RA. Pt's daughter plans to stay with her parents after DC. Pt has resources for [facility] if needed. No follow up OT recommended, however Acute OT will follow to facilitate safe DC home.       Recommendations for follow up therapy are one component of a multi-disciplinary discharge planning process, led by the attending physician.  Recommendations may be updated based on patient status, additional functional criteria and insurance authorization.   Assistance Recommended at Discharge Frequent or constant Supervision/Assistance  Patient can return home with the following A little help with walking and/or transfers;A lot of help with bathing/dressing/bathroom;Assistance with cooking/housework;Direct supervision/assist for medications management;Assist for transportation;Help with stairs or ramp for entrance    Functional Status Assessment  Patient has had a recent decline in their functional status and demonstrates the ability to make significant improvements in function in a reasonable and predictable amount of time.  Equipment Recommendations  None recommended by OT    Recommendations for Other Services       Precautions / Restrictions Precautions Precautions:  Fall Precaution Comments: blindness Required Braces or Orthoses: Other Brace Other Brace: abdominal binder over hematoma on R flank Restrictions Weight Bearing Restrictions: No      Mobility Bed Mobility               General bed mobility comments: OOB in chair    Transfers Overall transfer level: Needs assistance Equipment used: 1 person hand held assist Transfers: Sit to/from Stand Sit to Stand: Min guard                  Balance Overall balance assessment: Needs assistance   Sitting balance-Leahy Scale: Fair     Standing balance support: Single extremity supported Standing balance-Leahy Scale: Poor Standing balance comment: UE support for balance                           ADL either performed or assessed with clinical judgement   ADL Overall ADL's : Needs assistance/impaired                                     Functional mobility during ADLs: Minimal assistance General ADL Comments: Overall minguard assistance due to being visually impaired and in unfamiliar environment. Abdominal soreness interferring minimally; minimal SOB during functional tasks Pt has adaptive devices at home for the visually impaired     Vision Baseline Vision/History: 2 Legally blind       Perception     Praxis      Pertinent Vitals/Pain Pain Assessment Pain Assessment: Faces Faces Pain Scale: Hurts little more Pain Location: chest/ribs Pain Descriptors / Indicators: Discomfort Pain Intervention(s): Limited activity within patient's tolerance  Hand Dominance Right   Extremity/Trunk Assessment Upper Extremity Assessment Upper Extremity Assessment: Overall WFL for tasks assessed   Lower Extremity Assessment Lower Extremity Assessment: Defer to PT evaluation   Cervical / Trunk Assessment Cervical / Trunk Assessment: Other exceptions Cervical / Trunk Exceptions: cervical stiffness; trunk stiffness/pain; abdominal pain form hematoma    Communication Communication Communication: No difficulties   Cognition Arousal/Alertness: Awake/alert Behavior During Therapy: WFL for tasks assessed/performed Overall Cognitive Status: Within Functional Limits for tasks assessed - appears at baseline; daughter present and states she is at her baseline; discussed recommendations to directly S medication and financial management                                       General Comments  bruising @ neck and abdomen    Exercises     Shoulder Instructions      Home Living Family/patient expects to be discharged to:: Private residence Living Arrangements: Spouse/significant other Available Help at Discharge: Family (daughter staying until needed) Type of Home: House Home Access: Stairs to enter [employer] of Steps: 4 from deck and 2 from front Entrance Stairs-Rails: Can reach both Home Layout: Two level;Able to live on main level with bedroom/bathroom Alternate Level Stairs-Number of Steps: storage upstairs for extra supplies   Bathroom Shower/Tub: Tub/shower unit;Door   [employer] Toilet: Handicapped height (elongated) Bathroom Accessibility: Yes How Accessible: Accessible via walker Home Equipment: Shower seat;Grab bars - tub/shower;Grab bars - toilet;Cane - quad   Additional Comments: white cane      Prior Functioning/Environment Prior Level of Function : Independent/Modified Independent             Mobility Comments: ambulates without DME inside the home, ambulates with support of spouse in the community ADLs Comments: Ind with ADLs/selfcare, family or friends assist with transportation        OT Problem List: Decreased activity tolerance;Impaired balance (sitting and/or standing);Impaired vision/perception;Decreased safety awareness;Cardiopulmonary status limiting activity;Pain      OT Treatment/Interventions: Self-care/ADL training;Therapeutic exercise;Energy conservation;DME and/or AE  instruction;Therapeutic activities;Patient/family education;Visual/perceptual remediation/compensation;Balance training    OT Goals(Current goals can be found in the care plan section) Acute Rehab OT Goals Patient Stated Goal: to get better and go home OT Goal Formulation: With patient/family Time For Goal Achievement: 01/04/23 Potential to Achieve Goals: Good  OT Frequency: Min 2X/week    Co-evaluation              AM-PAC OT "6 Clicks" Daily Activity     Outcome Measure Help from another person eating meals?: A Little Help from another person taking care of personal grooming?: A Little Help from another person toileting, which includes using toliet, bedpan, or urinal?: A Little Help from another person bathing (including washing, rinsing, drying)?: A Little Help from another person to put on and taking off regular upper body clothing?: A Little Help from another person to put on and taking off regular lower body clothing?: A Little 6 Click Score: 18   End of Session Equipment Utilized During Treatment: Gait belt Nurse Communication: Mobility status  Activity Tolerance: Patient tolerated treatment well Patient left: in chair;with call bell/phone within reach;with family/visitor present  OT Visit Diagnosis: Unsteadiness on feet (R26.81);Other abnormalities of gait and mobility (R26.89);History of falling (Z91.81);Pain Pain - Right/Left: Right Pain - part of body:  (abdomen)  Time: 4098-1191 OT Time Calculation (min): 21 min Charges:  OT General Charges $OT Visit: 1 Visit OT Evaluation $OT Eval Moderate Complexity: 1 Mod  Kimberlyann Hollar, OT/L   Acute OT Clinical Specialist Acute Rehabilitation Services Pager [phone] Office [phone]   [facility] 12/21/2022, 12:47 PM

## 2022-12-21 NOTE — TOC Initial Note (Signed)
Transition of Care [facility]) - Initial/Assessment Note    Patient Details  Name: Jessica [name] MRN: [id number] Date of Birth: 02-19-1951  Transition of Care [facility] CM/SW Contact:    Glennon Mac, RN Phone Number: 12/21/2022, 4:48 PM  Clinical Narrative:                 Pt is a 72 y/o female presenting after fall down flight of stairs. 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  Her daughter is visiting from [place] to provide assistance as long as it is needed.  PT recommending home health follow-up, and patient/family agreeable to services.  Referral to [facility] home health for continued home health PT.   Expected Discharge Plan: Home w Home Health Services Barriers to Discharge: Continued Medical Work up   Patient Goals and CMS Choice Patient states their goals for this hospitalization and ongoing recovery are:: to go home CMS Medicare.gov Compare Post Acute Care list provided to:: Patient Choice offered to / list presented to : Patient      Expected Discharge Plan and Services   Discharge Planning Services: CM Consult Post Acute Care Choice: Home Health Living arrangements for the past 2 months: Single Family Home                           HH Arranged: PT HH Agency: [facility] Date [facility] Agency Contacted: 12/21/22 Time HH Agency Contacted: [phone] Representative spoke with at [facility] Agency: Kandee Keen  Prior Living Arrangements/Services Living arrangements for the past 2 months: Single Family Home Lives with:: Spouse Patient language and need for interpreter reviewed:: Yes Do you feel safe going back to the place where you live?: Yes      Need for Family Participation in Patient Care: Yes (Comment) Care giver support system in place?: Yes (comment) Current home  services: DME Criminal Activity/Legal Involvement Pertinent to Current Situation/Hospitalization: No - Comment as needed  Activities of Daily Living Home Assistive Devices/Equipment: Other (Comment) (she said she has thing but cant recall what they are) ADL Screening (condition at time of admission) Patient's cognitive ability adequate to safely complete daily activities?: Yes Is the patient deaf or have difficulty hearing?: No Does the patient have difficulty seeing, even when wearing glasses/contacts?: Yes Does the patient have difficulty concentrating, remembering, or making decisions?: No Patient able to express need for assistance with ADLs?: Yes Does the patient have difficulty dressing or bathing?: No Independently performs ADLs?: Yes (appropriate for developmental age) Does the patient have difficulty walking or climbing stairs?: Yes Weakness of Legs: Both Weakness of Arms/Hands: None  Permission Sought/Granted Permission sought to share information with : Facility Medical sales representative                Emotional Assessment Appearance:: Appears older than stated age Attitude/Demeanor/Rapport: Engaged Affect (typically observed): Accepting Orientation: : Oriented to Self, Oriented to Place, Oriented to  Time, Oriented to Situation      Admission diagnosis:  Cervical spine fracture (HCC) [S12.9XXA] Fall, initial encounter [id number]] Laceration of forehead, initial encounter [S01.81XA] Hematoma of right flank, initial encounter [S30.1XXA] Closed nondisplaced fracture of sixth cervical vertebra, unspecified fracture morphology, initial encounter [facility]) [S12.501A] Patient Active Problem List   Diagnosis Date Noted   Cervical spine fracture (HCC)  12/19/2022   Syncope and collapse 09/28/2022   History of CAD (coronary artery disease) 09/28/2022   Hematoma 09/28/2022   Hyperkalemia 09/28/2022   Hyponatremia 09/28/2022   Fall at home, initial encounter 09/28/2022   Head  injury 09/28/2022   Goals of care, counseling/discussion 09/28/2022   DNR (do not resuscitate) 09/28/2022   Palpitations 03/08/2022   Abdominal wall hematoma 05/13/2020   Hypertension 01/14/2020   CKD (chronic kidney disease), stage IV (HCC) 01/14/2020   Anemia 01/14/2020   Anxiety 01/14/2020   Acute ST elevation myocardial infarction (STEMI) (HCC) 12/30/2019   STEMI (ST elevation myocardial infarction) (HCC) 12/30/2019   STEMI involving oth coronary artery of inferior wall (HCC) 12/30/2019   Dysphagia 12/06/2017   History of esophageal stricture 12/06/2017   Nausea with vomiting 05/21/2013   Hypothyroidism 11/15/2007   Type 2 diabetes mellitus with complication, without long-term current use of insulin (HCC) 11/15/2007   Hyperlipidemia LDL goal <70 11/15/2007   Visual impairment 11/15/2007   ESOPHAGEAL STRICTURE 02/08/2007   GASTROESOPHAGEAL REFLUX DISEASE 02/08/2007   HIATAL HERNIA 02/08/2007   PCP:  Cleatis Polka., MD Pharmacy:   [id number] - [place], [place] - [address] [place] [address] Phone: [phone] Fax: [phone]  [facility] - [place], [place] - [address] [place] [address] Phone: [phone] Fax: [phone]  [facility] [address] [place] [address] Phone: [phone] Fax: [phone]     Social Determinants of Health (SDOH) Social History: SDOH Screenings   Food Insecurity: No Food Insecurity (12/20/2022)  Housing: Low Risk  (12/20/2022)  Transportation Needs: No Transportation Needs (12/20/2022)  Utilities: Not At Risk (12/20/2022)  Tobacco Use: Low Risk  (12/19/2022)   SDOH Interventions:     Readmission Risk Interventions    05/16/2020   12:09 PM  Readmission Risk Prevention Plan  Post Dischage Appt Complete  Medication Screening Complete  Transportation Screening Complete  Quintella Baton, RN, BSN   Trauma/Neuro ICU Case Manager [phone]

## 2022-12-21 NOTE — Progress Notes (Signed)
Subjective: CC: Sore over her R flank. Tolerating diet without n/v. Passing flatus. BM 2 days ago. Voiding.   No tachycardia or hypotension this am. Hgb 8 > 1U PRBC > 9 > 7.6 this am.    Objective: Vital signs in last 24 hours: Temp:  [97.9 F (36.6 C)-98.6 F (37 C)] 98.3 F (36.8 C) (05/28 0753) Pulse Rate:  [83-90] 88 (05/28 0753) Resp:  [15-28] 23 (05/28 0753) BP: (101-134)/(55-73) 106/73 (05/28 0753) SpO2:  [96 %-100 %] 96 % (05/28 0753) Last BM Date : 12/18/22 (Per pt)  Intake/Output from previous day: 05/27 0701 - 05/28 0700 In: 1114 [P.O.:720; Blood:394] Out: 4150 [Urine:3350; Stool:800] Intake/Output this shift: No intake/output data recorded.  PE: Gen:  Alert, NAD, pleasant HEENT: Forehead laceration cdi with sutures in place Card:  Reg Pulm:  CTAB, no W/R/R, effort normal Abd: Soft, ND, NT, R flank hematoma noted - abdominal binder reapplied.  Ext:  Good grip strength b/l. SILT to BUE and BLE. Spont MAE's. No LE edema.  Psych: A&Ox3   Lab Results:  Recent Labs    12/20/22 1526 12/21/22 0048  WBC 13.8* 10.1  HGB 9.0* 7.6*  HCT 27.2* 22.6*  PLT 176 160   BMET Recent Labs    12/20/22 0706 12/21/22 0048  NA 134* 137  K 4.8 4.0  CL 110 113*  CO2 16* 18*  GLUCOSE 263* 125*  BUN 23 21  CREATININE 1.85* 1.76*  CALCIUM 6.7* 6.7*   PT/INR Recent Labs    12/19/22 1733  LABPROT 13.2  INR 1.0   CMP     Component Value Date/Time   NA 137 12/21/2022 0048   K 4.0 12/21/2022 0048   CL 113 (H) 12/21/2022 0048   CO2 18 (L) 12/21/2022 0048   GLUCOSE 125 (H) 12/21/2022 0048   BUN 21 12/21/2022 0048   CREATININE 1.76 (H) 12/21/2022 0048   CALCIUM 6.7 (L) 12/21/2022 0048   PROT 6.0 (L) 12/19/2022 1733   ALBUMIN 3.4 (L) 12/19/2022 1733   AST 31 12/19/2022 1733   ALT 33 12/19/2022 1733   ALKPHOS 26 (L) 12/19/2022 1733   BILITOT 0.5 12/19/2022 1733   GFRNONAA 30 (L) 12/21/2022 0048   GFRAA 27 (L) 01/14/2020 0504   Lipase     Component  Value Date/Time   LIPASE 54 (H) 03/08/2022 1845    Studies/Results: MR CERVICAL SPINE WO CONTRAST  Result Date: 12/19/2022 CLINICAL DATA:  Spine fracture EXAM: MRI CERVICAL SPINE WITHOUT CONTRAST TECHNIQUE: Multiplanar, multisequence MR imaging of the cervical spine was performed. No intravenous contrast was administered. COMPARISON:  None Available. FINDINGS: Alignment: Physiologic. Vertebrae: No fracture, evidence of discitis, or bone lesion. Cord: Normal signal and morphology. Posterior Fossa, vertebral arteries, paraspinal tissues: Negative. Disc levels: C1-2: Unremarkable. C2-3: Small central disc protrusion. There is no spinal canal stenosis. No neural foraminal stenosis. C3-4: Small central disc protrusion and moderate left facet hypertrophy. There is no spinal canal stenosis. Moderate left neural foraminal stenosis. C4-5: Small disc bulge. Mild spinal canal stenosis. No neural foraminal stenosis. C5-6: Medium-sized disc bulge with uncovertebral hypertrophy. Moderate spinal canal stenosis. Mild bilateral neural foraminal stenosis. C6-7: Small disc bulge with endplate spurring. There is no spinal canal stenosis. No neural foraminal stenosis. C7-T1: Normal disc space and facet joints. There is no spinal canal stenosis. No neural foraminal stenosis. IMPRESSION: 1. No acute abnormality of the cervical spine. 2. Moderate spinal canal stenosis and mild bilateral neural foraminal stenosis at C5-6. 3.  Moderate left C3-4 neural foraminal stenosis. 4. Mild spinal canal stenosis at C4-5. Electronically Signed   By: Deatra Robinson M.D.   On: 12/19/2022 22:47   CT ANGIO NECK W OR WO CONTRAST  Result Date: 12/19/2022 CLINICAL DATA:  Head trauma EXAM: CT ANGIOGRAPHY NECK TECHNIQUE: Multidetector CT imaging of the neck was performed using the standard protocol during bolus administration of intravenous contrast. Multiplanar CT image reconstructions and MIPs were obtained to evaluate the vascular anatomy. Carotid  stenosis measurements (when applicable) are obtained utilizing NASCET criteria, using the distal internal carotid diameter as the denominator. RADIATION DOSE REDUCTION: This exam was performed according to the departmental dose-optimization program which includes automated exposure control, adjustment of the mA and/or kV according to patient size and/or use of iterative reconstruction technique. CONTRAST:  75mL OMNIPAQUE IOHEXOL 350 MG/ML SOLN COMPARISON:  None Available. FINDINGS: Aortic arch: Standard branching. Imaged portion shows no evidence of aneurysm or dissection. No significant stenosis of the major arch vessel origins. Right carotid system: No evidence of dissection, stenosis (50% or greater) or occlusion. Left carotid system: No evidence of dissection, stenosis (50% or greater) or occlusion. Vertebral arteries: Left dominant.  Normal. Skeleton: Please refer to dedicated report for cervical spine CT. Other neck: There is soft tissue edema of the right neck with mild asymmetric expansion of the right sternocleidomastoid, likely indicating mild intramuscular hematoma. Upper chest: Clear IMPRESSION: 1. No arterial injury in the neck. 2. Soft tissue edema of the right neck with mild asymmetric expansion of the right sternocleidomastoid, likely indicating mild intramuscular hematoma. Electronically Signed   By: Deatra Robinson M.D.   On: 12/19/2022 19:37   CT CHEST ABDOMEN PELVIS W CONTRAST  Result Date: 12/19/2022 CLINICAL DATA:  Polytrauma, blunt EXAM: CT CHEST, ABDOMEN, AND PELVIS WITH CONTRAST TECHNIQUE: Multidetector CT imaging of the chest, abdomen and pelvis was performed following the standard protocol during bolus administration of intravenous contrast. RADIATION DOSE REDUCTION: This exam was performed according to the departmental dose-optimization program which includes automated exposure control, adjustment of the mA and/or kV according to patient size and/or use of iterative reconstruction  technique. CONTRAST:  75mL OMNIPAQUE IOHEXOL 350 MG/ML SOLN COMPARISON:  CT abdomen pelvis 09/28/2022, CT abdomen pelvis 04/18/2006, CT chest 06/30/2021 FINDINGS: CHEST: Cardiovascular: No aortic injury. The thoracic aorta is normal in caliber. The heart is normal in size. No significant pericardial effusion. Mild atherosclerotic plaque. At least 2 vessel coronary calcification. Mediastinum/Nodes: No pneumomediastinum. No mediastinal hematoma. The esophagus is unremarkable. The thyroid is unremarkable. The central airways are patent. No mediastinal, hilar, or axillary lymphadenopathy. Lungs/Pleura: No focal consolidation. Stable chronic lingular 1.6 x 1.2 cm pulmonary nodule-no further follow-up indicated as likely benign. Stable chronic subpleural right lung 3 mm pulmonary nodule-no further follow-up indicated. No pulmonary mass. No pulmonary contusion or laceration. No pneumatocele formation. No pleural effusion. No pneumothorax. No hemothorax. Musculoskeletal/Chest wall: No chest wall mass. No acute rib or sternal fracture. No spinal fracture. Chronic superior endplate T12 vertebral body height loss in the setting of a Schmorl node. ABDOMEN / PELVIS: Hepatobiliary: Not enlarged. No focal lesion. No laceration or subcapsular hematoma. Status post cholecystectomy. Dilated common bile duct measuring up to 15 mm with no intrahepatic biliary ductal dilatation. Finding can be seen in the post cholecystectomy setting. No biliary ductal dilatation. Pancreas: Normal pancreatic contour. No main pancreatic duct dilatation. Spleen: Not enlarged. No focal lesion. No laceration, subcapsular hematoma, or vascular injury. Adrenals/Urinary Tract: No nodularity bilaterally. Bilateral kidneys enhance symmetrically. No hydronephrosis. No  contusion, laceration, or subcapsular hematoma. No injury to the vascular structures or collecting systems. No hydroureter. The urinary bladder is unremarkable. On delayed imaging, there is no  urothelial wall thickening and there are no filling defects in the opacified portions of the bilateral collecting systems or ureters. Stomach/Bowel: No small or large bowel wall thickening or dilatation. Colonic diverticulosis. The appendix is unremarkable. Vasculature/Lymphatics: Moderate atherosclerotic plaque. No abdominal aorta or iliac aneurysm. No active contrast extravasation or pseudoaneurysm. No abdominal, pelvic, inguinal lymphadenopathy. Reproductive: Normal. Other: No simple free fluid ascites. No pneumoperitoneum. No hemoperitoneum. No mesenteric hematoma identified. No organized fluid collection. Musculoskeletal: A right flank 13.8 x 5.5 x7 cm subcutaneus soft tissue hematoma with high density material within the hematoma and blooming noted on delayed imaging. Surrounding subcutaneus soft tissue fat stranding. No acute pelvic fracture. No spinal fracture.  L1 kyphoplasty. Ports and Devices: None. IMPRESSION: 1. A right flank 14 x 6 x 7 cm subcutaneus soft tissue hematoma with active extravasation. Surrounding subcutaneus soft tissue fat stranding. 2. No acute intrathoracic, intra-abdominal, intrapelvic traumatic injury. 3. No acute fracture or traumatic malalignment of the thoracic or lumbar spine. These results were called by telephone at the time of interpretation on 12/19/2022 at 7:21 pm to provider [facility] , who verbally acknowledged these results. Electronically Signed   By: Tish Frederickson M.D.   On: 12/19/2022 19:35   DG Humerus Right  Result Date: 12/19/2022 CLINICAL DATA:  Status post fall. EXAM: RIGHT HUMERUS - 2+ VIEW COMPARISON:  None Available. FINDINGS: There is no evidence of fracture or other focal bone lesions. Soft tissues are unremarkable. IMPRESSION: Negative. Electronically Signed   By: Ted Mcalpine M.D.   On: 12/19/2022 19:33   CT HEAD WO CONTRAST  Addendum Date: 12/19/2022   ADDENDUM REPORT: 12/19/2022 19:23 ADDENDUM: Asymmetric right neck musculature with  subcutaneus soft tissue edema. Query intramuscular hematoma. Please see separately dictated CT angio neck 12/19/2022. These results were called by telephone at the time of interpretation on 12/19/2022 at 7:21 pm to provider [facility] , who verbally acknowledged these results. Electronically Signed   By: Tish Frederickson M.D.   On: 12/19/2022 19:23   Result Date: 12/19/2022 CLINICAL DATA:  Head trauma, moderate-severe; Facial trauma, blunt; Polytrauma, blunt EXAM: CT HEAD WITHOUT CONTRAST CT MAXILLOFACIAL WITHOUT CONTRAST CT CERVICAL SPINE WITHOUT CONTRAST TECHNIQUE: Multidetector CT imaging of the head, cervical spine, and maxillofacial structures were performed using the standard protocol without intravenous contrast. Multiplanar CT image reconstructions of the cervical spine and maxillofacial structures were also generated. RADIATION DOSE REDUCTION: This exam was performed according to the departmental dose-optimization program which includes automated exposure control, adjustment of the mA and/or kV according to patient size and/or use of iterative reconstruction technique. COMPARISON:  CT head and C-spine 09/28/2022 FINDINGS: CT HEAD FINDINGS Brain: Left occipital encephalomalacia. No evidence of large-territorial acute infarction. No parenchymal hemorrhage. No mass lesion. No extra-axial collection. No mass effect or midline shift. No hydrocephalus. Basilar cisterns are patent. Partial empty sella. Vascular: No hyperdense vessel. Skull: No acute fracture or focal lesion. Other: Right frontal scalp hematoma measuring up to 4 mm. CT MAXILLOFACIAL FINDINGS Osseous: No fracture or mandibular dislocation. No destructive process. Sinuses/Orbits: Paranasal sinuses and mastoid air cells are clear. The orbits are unremarkable. Soft tissues: Right periorbital subcutaneus soft tissue edema and emphysema with overlying soft tissue defect of the nasal bridge and medial right periorbital region. CT CERVICAL SPINE  FINDINGS Alignment: Stable grade 1 anterolisthesis of C3 on C4. Skull  base and vertebrae: Interval development of an age-indeterminate, possibly acute, minimally displaced triangular fracture of the C6 anterior inferior endplate (8:37). Multilevel degenerative changes of the spine with posterior disc osteophyte complex formation at the C5-C6 and C6-C7 levels. Multilevel disc bulges. Ligamentum flavum calcification. No associated severe osseous neural foraminal or central canal stenosis. No aggressive appearing focal osseous lesion or focal pathologic process. Soft tissues and spinal canal: No prevertebral fluid or swelling. No visible canal hematoma. Upper chest: Unremarkable. Other: None. IMPRESSION: 1. No acute intracranial abnormality. 2.  No acute displaced facial fracture. 3. Suggestion of an acute teardrop fracture of the C6 anterior inferior endplate. Recommend MRI for further evaluation. 4. A 4 mm right frontal scalp hematoma with soft tissue defect of the nasal bridge and medial right periorbital region. Electronically Signed: By: Tish Frederickson M.D. On: 12/19/2022 19:13   CT MAXILLOFACIAL WO CONTRAST  Addendum Date: 12/19/2022   ADDENDUM REPORT: 12/19/2022 19:23 ADDENDUM: Asymmetric right neck musculature with subcutaneus soft tissue edema. Query intramuscular hematoma. Please see separately dictated CT angio neck 12/19/2022. These results were called by telephone at the time of interpretation on 12/19/2022 at 7:21 pm to provider [facility] , who verbally acknowledged these results. Electronically Signed   By: Tish Frederickson M.D.   On: 12/19/2022 19:23   Result Date: 12/19/2022 CLINICAL DATA:  Head trauma, moderate-severe; Facial trauma, blunt; Polytrauma, blunt EXAM: CT HEAD WITHOUT CONTRAST CT MAXILLOFACIAL WITHOUT CONTRAST CT CERVICAL SPINE WITHOUT CONTRAST TECHNIQUE: Multidetector CT imaging of the head, cervical spine, and maxillofacial structures were performed using the standard protocol  without intravenous contrast. Multiplanar CT image reconstructions of the cervical spine and maxillofacial structures were also generated. RADIATION DOSE REDUCTION: This exam was performed according to the departmental dose-optimization program which includes automated exposure control, adjustment of the mA and/or kV according to patient size and/or use of iterative reconstruction technique. COMPARISON:  CT head and C-spine 09/28/2022 FINDINGS: CT HEAD FINDINGS Brain: Left occipital encephalomalacia. No evidence of large-territorial acute infarction. No parenchymal hemorrhage. No mass lesion. No extra-axial collection. No mass effect or midline shift. No hydrocephalus. Basilar cisterns are patent. Partial empty sella. Vascular: No hyperdense vessel. Skull: No acute fracture or focal lesion. Other: Right frontal scalp hematoma measuring up to 4 mm. CT MAXILLOFACIAL FINDINGS Osseous: No fracture or mandibular dislocation. No destructive process. Sinuses/Orbits: Paranasal sinuses and mastoid air cells are clear. The orbits are unremarkable. Soft tissues: Right periorbital subcutaneus soft tissue edema and emphysema with overlying soft tissue defect of the nasal bridge and medial right periorbital region. CT CERVICAL SPINE FINDINGS Alignment: Stable grade 1 anterolisthesis of C3 on C4. Skull base and vertebrae: Interval development of an age-indeterminate, possibly acute, minimally displaced triangular fracture of the C6 anterior inferior endplate (8:37). Multilevel degenerative changes of the spine with posterior disc osteophyte complex formation at the C5-C6 and C6-C7 levels. Multilevel disc bulges. Ligamentum flavum calcification. No associated severe osseous neural foraminal or central canal stenosis. No aggressive appearing focal osseous lesion or focal pathologic process. Soft tissues and spinal canal: No prevertebral fluid or swelling. No visible canal hematoma. Upper chest: Unremarkable. Other: None. IMPRESSION:  1. No acute intracranial abnormality. 2.  No acute displaced facial fracture. 3. Suggestion of an acute teardrop fracture of the C6 anterior inferior endplate. Recommend MRI for further evaluation. 4. A 4 mm right frontal scalp hematoma with soft tissue defect of the nasal bridge and medial right periorbital region. Electronically Signed: By: Tish Frederickson M.D. On: 12/19/2022 19:13  CT CERVICAL SPINE WO CONTRAST  Addendum Date: 12/19/2022   ADDENDUM REPORT: 12/19/2022 19:23 ADDENDUM: Asymmetric right neck musculature with subcutaneus soft tissue edema. Query intramuscular hematoma. Please see separately dictated CT angio neck 12/19/2022. These results were called by telephone at the time of interpretation on 12/19/2022 at 7:21 pm to provider [facility] , who verbally acknowledged these results. Electronically Signed   By: Tish Frederickson M.D.   On: 12/19/2022 19:23   Result Date: 12/19/2022 CLINICAL DATA:  Head trauma, moderate-severe; Facial trauma, blunt; Polytrauma, blunt EXAM: CT HEAD WITHOUT CONTRAST CT MAXILLOFACIAL WITHOUT CONTRAST CT CERVICAL SPINE WITHOUT CONTRAST TECHNIQUE: Multidetector CT imaging of the head, cervical spine, and maxillofacial structures were performed using the standard protocol without intravenous contrast. Multiplanar CT image reconstructions of the cervical spine and maxillofacial structures were also generated. RADIATION DOSE REDUCTION: This exam was performed according to the departmental dose-optimization program which includes automated exposure control, adjustment of the mA and/or kV according to patient size and/or use of iterative reconstruction technique. COMPARISON:  CT head and C-spine 09/28/2022 FINDINGS: CT HEAD FINDINGS Brain: Left occipital encephalomalacia. No evidence of large-territorial acute infarction. No parenchymal hemorrhage. No mass lesion. No extra-axial collection. No mass effect or midline shift. No hydrocephalus. Basilar cisterns are patent.  Partial empty sella. Vascular: No hyperdense vessel. Skull: No acute fracture or focal lesion. Other: Right frontal scalp hematoma measuring up to 4 mm. CT MAXILLOFACIAL FINDINGS Osseous: No fracture or mandibular dislocation. No destructive process. Sinuses/Orbits: Paranasal sinuses and mastoid air cells are clear. The orbits are unremarkable. Soft tissues: Right periorbital subcutaneus soft tissue edema and emphysema with overlying soft tissue defect of the nasal bridge and medial right periorbital region. CT CERVICAL SPINE FINDINGS Alignment: Stable grade 1 anterolisthesis of C3 on C4. Skull base and vertebrae: Interval development of an age-indeterminate, possibly acute, minimally displaced triangular fracture of the C6 anterior inferior endplate (8:37). Multilevel degenerative changes of the spine with posterior disc osteophyte complex formation at the C5-C6 and C6-C7 levels. Multilevel disc bulges. Ligamentum flavum calcification. No associated severe osseous neural foraminal or central canal stenosis. No aggressive appearing focal osseous lesion or focal pathologic process. Soft tissues and spinal canal: No prevertebral fluid or swelling. No visible canal hematoma. Upper chest: Unremarkable. Other: None. IMPRESSION: 1. No acute intracranial abnormality. 2.  No acute displaced facial fracture. 3. Suggestion of an acute teardrop fracture of the C6 anterior inferior endplate. Recommend MRI for further evaluation. 4. A 4 mm right frontal scalp hematoma with soft tissue defect of the nasal bridge and medial right periorbital region. Electronically Signed: By: Tish Frederickson M.D. On: 12/19/2022 19:13   DG Femur Min 2 Views Right  Result Date: 12/19/2022 CLINICAL DATA:  Fall down stairs. EXAM: RIGHT FEMUR 2 VIEWS COMPARISON:  None Available. FINDINGS: There is no evidence of fracture or other focal bone lesions. Cortical margins of the femur are intact. Hip and knee alignment are maintained. Soft tissues are  unremarkable. IMPRESSION: No fracture of the right femur. Electronically Signed   By: Narda Rutherford M.D.   On: 12/19/2022 18:27   DG Shoulder Right  Result Date: 12/19/2022 CLINICAL DATA:  Fall down stairs. EXAM: RIGHT SHOULDER - 2+ VIEW COMPARISON:  None Available. FINDINGS: There is no evidence of fracture or dislocation. There is no evidence of arthropathy or other focal bone abnormality. Soft tissues are unremarkable. IMPRESSION: No fracture or dislocation of the right shoulder. Electronically Signed   By: Narda Rutherford M.D.   On: 12/19/2022  18:26   DG Pelvis Portable  Result Date: 12/19/2022 CLINICAL DATA:  Trauma, fall down stairs. EXAM: PORTABLE PELVIS 1-2 VIEWS COMPARISON:  None Available. FINDINGS: Please note that the inferior pubic rami and portions of the proximal femurs are not included in the field of view. No fracture of the included pelvis. No pubic symphyseal or sacroiliac diastasis. Femoral heads are seated in the acetabulum. IMPRESSION: No fracture of the pelvis, lower aspect excluded from the field of view. CT is planned. Electronically Signed   By: Narda Rutherford M.D.   On: 12/19/2022 18:25   DG Chest Port 1 View  Result Date: 12/19/2022 CLINICAL DATA:  Trauma, fall down stairs. EXAM: PORTABLE CHEST 1 VIEW COMPARISON:  Radiograph 09/28/2022 FINDINGS: The cardiomediastinal contours are normal. The lungs are clear. Pulmonary vasculature is normal. No consolidation, pleural effusion, or pneumothorax. Stable lingular nodule. No acute osseous abnormalities are seen. IMPRESSION: 1. No acute chest findings. 2. Stable lingular nodule. Electronically Signed   By: Narda Rutherford M.D.   On: 12/19/2022 18:24    Anti-infectives: Anti-infectives (From admission, onward)    Start     Dose/Rate Route Frequency Ordered Stop   12/19/22 1745  ceFAZolin (ANCEF) IVPB 2g/100 mL premix        2 g 200 mL/hr over 30 Minutes Intravenous  Once 12/19/22 1734 12/19/22 1922         Assessment/Plan 72 yo female with blindness fell down stairs R flank hematoma - hold anticoagulation, abdominal binder, trend hgb ABL anemia - Hgb 8 > 1U PRBC > 9 > 7.6 this am. No tachycardia or hypotension this am.  Repeat cbc in am.  C6 rain drop fracture - Noted on CT.  CTA neck without arterial injury. MRI with no acute abnormality of the cervical spine. C-Collar off.  Laceration forehead - EDP PA repaired 5/26 Hypothyroidism - home meds HTN - Hold home Metoprolol. BP ok right now.  DM - SSI FEN-  carb mod diet VTE- SCDs, hold prophylaxis due to hematoma ID- None currently. Afebrile.  Dispo- 4NP, PT/OT, trend hgb  I reviewed nursing notes, last 24 h vitals and pain scores, last 48 h intake and output, last 24 h labs and trends, and last 24 h imaging results.   LOS: 2 days    Jacinto Halim , [facility] 12/21/2022, 9:59 AM Please see Amion for pager number during day hours 7:00am-4:30pm

## 2022-12-21 NOTE — Progress Notes (Signed)
1945 patient alert x4 able to make all needs known on room air legally blind call light and bed nurse call button reviewed with patient and patient able to press them.

## 2022-12-21 NOTE — Progress Notes (Signed)
PT Cancellation Note  Patient Details Name: ANAYELI FELICIANO MRN: [id number] DOB: April 19, 1951   Cancelled Treatment:    Reason Eval/Treat Not Completed: Pain limiting ability to participate; RN reports pt just back to bed, had walked two laps in hallway earlier with daughter.  Currently feeling ill and states more pain in chest and some difficulty taking a deep breath.  Also reports hemoglobin still low.  Will cancel for the pm and check back tomorrow.    Elray Mcgregor 12/21/2022, 3:52 PM Sheran Lawless, PT Acute Rehabilitation Services Office:[phone] 12/21/2022

## 2022-12-22 LAB — CBC
HCT: 22.7 % — ABNORMAL LOW (ref 36.0–46.0)
Hemoglobin: 7.6 g/dL — ABNORMAL LOW (ref 12.0–15.0)
MCH: 29.9 pg (ref 26.0–34.0)
MCHC: 33.5 g/dL (ref 30.0–36.0)
MCV: 89.4 fL (ref 80.0–100.0)
Platelets: 170 10*3/uL (ref 150–400)
RBC: 2.54 MIL/uL — ABNORMAL LOW (ref 3.87–5.11)
RDW: 16.3 % — ABNORMAL HIGH (ref 11.5–15.5)
WBC: 11.3 10*3/uL — ABNORMAL HIGH (ref 4.0–10.5)
nRBC: 0 % (ref 0.0–0.2)

## 2022-12-22 LAB — BASIC METABOLIC PANEL
Anion gap: 9 (ref 5–15)
BUN: 18 mg/dL (ref 8–23)
CO2: 15 mmol/L — ABNORMAL LOW (ref 22–32)
Calcium: 7.3 mg/dL — ABNORMAL LOW (ref 8.9–10.3)
Chloride: 111 mmol/L (ref 98–111)
Creatinine, Ser: 1.56 mg/dL — ABNORMAL HIGH (ref 0.44–1.00)
GFR, Estimated: 35 mL/min — ABNORMAL LOW (ref >60)
Glucose, Bld: 139 mg/dL — ABNORMAL HIGH (ref 70–99)
Potassium: 4.3 mmol/L (ref 3.5–5.1)
Sodium: 135 mmol/L (ref 135–145)

## 2022-12-22 LAB — GLUCOSE, CAPILLARY
Glucose-Capillary: 155 mg/dL — ABNORMAL HIGH (ref 70–99)
Glucose-Capillary: 153 mg/dL — ABNORMAL HIGH (ref 70–99)

## 2022-12-22 MED ORDER — METHOCARBAMOL 500 MG PO TABS: 500.0000 mg | ORAL_TABLET | Freq: Three times a day (TID) | ORAL | 0 refills | Status: AC | PRN

## 2022-12-22 MED ORDER — METOPROLOL SUCCINATE ER 25 MG PO TB24
37.5000 mg | ORAL_TABLET | Freq: Every day | ORAL | Status: DC
  Administered 2022-12-22: 37.5 mg via ORAL
  Filled 2022-12-22 (×2): qty 2

## 2022-12-22 NOTE — TOC Transition Note (Signed)
Transition of Care [facility]) - CM/SW Discharge Note   Patient Details  Name: Jessica [name] MRN: [id number] Date of Birth: 1950/10/30  Transition of Care [facility]) CM/SW Contact:  Glennon Mac, RN Phone Number: 12/22/2022, 11:58am   Clinical Narrative:    Patient medically stable for discharge home today with family and home health services as previously arranged.  PT recommending rolling walker, and patient agreeable.  Referral to [employer] [facility] for rolling walker to be delivered to bedside prior to discharge.   Final next level of care: Home w Home Health Services Barriers to Discharge: Barriers Resolved   Patient Goals and CMS Choice CMS Medicare.gov Compare Post Acute Care list provided to:: Patient Choice offered to / list presented to : Patient                        Discharge Plan and Services Additional resources added to the After Visit Summary for     Discharge Planning Services: CM Consult Post Acute Care Choice: Home Health          DME Arranged: Walker rolling DME Agency: [employer] Date DME Agency Contacted: 12/22/22 Time DME Agency Contacted: [id number] Representative spoke with at DME Agency: Mickeal Needy HH Arranged: PT HH Agency: [facility] Date [facility] Agency Contacted: 12/21/22 Time HH Agency Contacted: [phone] Representative spoke with at [facility] Agency: Kandee Keen  Social Determinants of Health (SDOH) Interventions SDOH Screenings   Food Insecurity: No Food Insecurity (12/20/2022)  Housing: Low Risk  (12/20/2022)  Transportation Needs: No Transportation Needs (12/20/2022)  Utilities: Not At Risk (12/20/2022)  Tobacco Use: Low Risk  (12/19/2022)     Readmission Risk Interventions    05/16/2020   12:09 PM  Readmission Risk Prevention Plan  Post Dischage Appt Complete  Medication Screening Complete  Transportation Screening Complete   Quintella Baton, RN, BSN  Trauma/Neuro ICU Case Manager [phone]

## 2022-12-22 NOTE — Plan of Care (Signed)
Problem: Education: Goal: Ability to describe self-care measures that may prevent or decrease complications (Diabetes Survival Skills Education) will improve 12/22/2022 1635 by Dannielle Burn, RN Outcome: Adequate for Discharge 12/22/2022 0941 by Dannielle Burn, RN Outcome: Progressing Goal: Individualized Educational Video(s) 12/22/2022 1635 by Dannielle Burn, RN Outcome: Adequate for Discharge 12/22/2022 0941 by Dannielle Burn, RN Outcome: Progressing   Problem: Coping: Goal: Ability to adjust to condition or change in health will improve 12/22/2022 1635 by Dannielle Burn, RN Outcome: Adequate for Discharge 12/22/2022 0941 by Dannielle Burn, RN Outcome: Progressing   Problem: Fluid Volume: Goal: Ability to maintain a balanced intake and output will improve 12/22/2022 1635 by Dannielle Burn, RN Outcome: Adequate for Discharge 12/22/2022 0941 by Dannielle Burn, RN Outcome: Progressing   Problem: Health Behavior/Discharge Planning: Goal: Ability to identify and utilize available resources and services will improve 12/22/2022 1635 by Dannielle Burn, RN Outcome: Adequate for Discharge 12/22/2022 0941 by Dannielle Burn, RN Outcome: Progressing Goal: Ability to manage health-related needs will improve 12/22/2022 1635 by Dannielle Burn, RN Outcome: Adequate for Discharge 12/22/2022 0941 by Dannielle Burn, RN Outcome: Progressing   Problem: Metabolic: Goal: Ability to maintain appropriate glucose levels will improve 12/22/2022 1635 by Dannielle Burn, RN Outcome: Adequate for Discharge 12/22/2022 0941 by Dannielle Burn, RN Outcome: Progressing   Problem: Nutritional: Goal: Maintenance of adequate nutrition will improve 12/22/2022 1635 by Dannielle Burn, RN Outcome: Adequate for Discharge 12/22/2022 0941 by Dannielle Burn, RN Outcome: Progressing Goal: Progress toward achieving an optimal weight will improve 12/22/2022 1635 by Dannielle Burn, RN Outcome:  Adequate for Discharge 12/22/2022 0941 by Dannielle Burn, RN Outcome: Progressing   Problem: Skin Integrity: Goal: Risk for impaired skin integrity will decrease 12/22/2022 1635 by Dannielle Burn, RN Outcome: Adequate for Discharge 12/22/2022 0941 by Dannielle Burn, RN Outcome: Progressing   Problem: Tissue Perfusion: Goal: Adequacy of tissue perfusion will improve 12/22/2022 1635 by Dannielle Burn, RN Outcome: Adequate for Discharge 12/22/2022 0941 by Dannielle Burn, RN Outcome: Progressing   Problem: Education: Goal: Knowledge of General Education information will improve Description: Including pain rating scale, medication(s)/side effects and non-pharmacologic comfort measures 12/22/2022 1635 by Dannielle Burn, RN Outcome: Adequate for Discharge 12/22/2022 0941 by Dannielle Burn, RN Outcome: Progressing   Problem: Health Behavior/Discharge Planning: Goal: Ability to manage health-related needs will improve 12/22/2022 1635 by Dannielle Burn, RN Outcome: Adequate for Discharge 12/22/2022 0941 by Dannielle Burn, RN Outcome: Progressing   Problem: Clinical Measurements: Goal: Ability to maintain clinical measurements within normal limits will improve 12/22/2022 1635 by Dannielle Burn, RN Outcome: Adequate for Discharge 12/22/2022 0941 by Dannielle Burn, RN Outcome: Progressing Goal: Will remain free from infection 12/22/2022 1635 by Dannielle Burn, RN Outcome: Adequate for Discharge 12/22/2022 0941 by Dannielle Burn, RN Outcome: Progressing Goal: Diagnostic test results will improve 12/22/2022 1635 by Dannielle Burn, RN Outcome: Adequate for Discharge 12/22/2022 0941 by Dannielle Burn, RN Outcome: Progressing Goal: Respiratory complications will improve 12/22/2022 1635 by Dannielle Burn, RN Outcome: Adequate for Discharge 12/22/2022 0941 by Dannielle Burn, RN Outcome: Progressing Goal: Cardiovascular complication will be avoided 12/22/2022 1635 by  Dannielle Burn, RN Outcome: Adequate for Discharge 12/22/2022 0941 by Dannielle Burn, RN Outcome: Progressing   Problem: Activity: Goal: Risk for activity intolerance will decrease 12/22/2022 1635 by Dannielle Burn, RN Outcome: Adequate for Discharge  12/22/2022 0941 by Dannielle Burn, RN Outcome: Progressing   Problem: Nutrition: Goal: Adequate nutrition will be maintained 12/22/2022 1635 by Dannielle Burn, RN Outcome: Adequate for Discharge 12/22/2022 0941 by Dannielle Burn, RN Outcome: Progressing   Problem: Coping: Goal: Level of anxiety will decrease 12/22/2022 1635 by Dannielle Burn, RN Outcome: Adequate for Discharge 12/22/2022 0941 by Dannielle Burn, RN Outcome: Progressing   Problem: Elimination: Goal: Will not experience complications related to bowel motility 12/22/2022 1635 by Dannielle Burn, RN Outcome: Adequate for Discharge 12/22/2022 0941 by Dannielle Burn, RN Outcome: Progressing Goal: Will not experience complications related to urinary retention 12/22/2022 1635 by Dannielle Burn, RN Outcome: Adequate for Discharge 12/22/2022 0941 by Dannielle Burn, RN Outcome: Progressing   Problem: Pain Managment: Goal: General experience of comfort will improve 12/22/2022 1635 by Dannielle Burn, RN Outcome: Adequate for Discharge 12/22/2022 0941 by Dannielle Burn, RN Outcome: Progressing   Problem: Safety: Goal: Ability to remain free from injury will improve 12/22/2022 1635 by Dannielle Burn, RN Outcome: Adequate for Discharge 12/22/2022 0941 by Dannielle Burn, RN Outcome: Progressing   Problem: Skin Integrity: Goal: Risk for impaired skin integrity will decrease 12/22/2022 1635 by Dannielle Burn, RN Outcome: Adequate for Discharge 12/22/2022 0941 by Dannielle Burn, RN Outcome: Progressing

## 2022-12-22 NOTE — Progress Notes (Signed)
Physical Therapy Treatment Patient Details Name: Jessica [name] MRN: [id number] DOB: 26-Jun-1951 Today's Date: 12/22/2022   History of Present Illness Pt is a 72 y/o female presenting after fall down flight of stairs. 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 PMH includes anemia, anxiety, CKD III, congenital blindness, DM, HTN, HLD, STEMI.    PT Comments    Patient for possible d/c home today.  Covered stairs for home entry with daughter return demonstrate assist.  Also discussed home set up with area rug in living room.  Encouraged supervision to assist especially with showering though they have a slide in seat.  Patient progressing well and stable for home with family support and follow up HHPT.    Recommendations for follow up therapy are one component of a multi-disciplinary discharge planning process, led by the attending physician.  Recommendations may be updated based on patient status, additional functional criteria and insurance authorization.  Follow Up Recommendations       Assistance Recommended at Discharge Frequent or constant Supervision/Assistance  Patient can return home with the following A little help with walking and/or transfers;A little help with bathing/dressing/bathroom;Assistance with cooking/housework;Assist for transportation;Help with stairs or ramp for entrance   Equipment Recommendations  Rolling walker (2 wheels)    Recommendations for Other Services       Precautions / Restrictions Precautions Precautions: Fall Precaution Comments: blindness Required Braces or Orthoses: Other Brace Other Brace: abdominal binder over hematoma on R flank Restrictions Weight Bearing Restrictions: No     Mobility  Bed Mobility               General bed mobility comments: up in room with daughter coming from bathroom, ended up in chair    Transfers    Equipment used: 1 person hand held assist Transfers: Sit to/from Stand Sit to Stand: Min guard           General transfer comment: stand to sit to recliner; cues and assist for positioning    Ambulation/Gait Ambulation/Gait assistance: Min guard, Min assist Gait Distance (Feet): 250 Feet Assistive device: 1 person hand held assist, 2 person hand held assist Gait Pattern/deviations: Step-through pattern, Decreased stride length       General Gait Details: on R pt holding in PT's arm for guidance, coming back from stairs daughter holding opposite hand   Stairs Stairs: Yes Stairs assistance: Min assist Stair Management: One rail Left, Step to pattern, Forwards (and hand hold A) Number of Stairs: 4 (x 2) General stair comments: assist with HHA on R and rail on L initially with PT A then with daughter A   Wheelchair Mobility    Modified Rankin (Stroke Patients Only)       Balance Overall balance assessment: Needs assistance   Sitting balance-Leahy Scale: Good       Standing balance-Leahy Scale: Fair Standing balance comment: standing unsupported initially with static balance                            Cognition Arousal/Alertness: Awake/alert Behavior During Therapy: WFL for tasks assessed/performed Overall Cognitive Status: Within Functional Limits for tasks assessed  Exercises      General Comments General comments (skin integrity, edema, etc.): daughter and spouse present; educated on assist for safety especially with transitions and in shower.  They asked about area rug in the living room and encouraged to tape down if concern she may trip on the edge, but pt relates she usually stays on hardwood flooring and avoids the rug.      Pertinent Vitals/Pain Pain Assessment Pain Score: 2  Pain Location: R thigh Pain Descriptors / Indicators: Sore Pain Intervention(s): Monitored during  session    Home Living                          Prior Function            PT Goals (current goals can now be found in the care plan section) Progress towards PT goals: Progressing toward goals    Frequency    Min 4X/week      PT Plan Current plan remains appropriate    Co-evaluation              AM-PAC PT "6 Clicks" Mobility   Outcome Measure  Help needed turning from your back to your side while in a flat bed without using bedrails?: A Little Help needed moving from lying on your back to sitting on the side of a flat bed without using bedrails?: A Little Help needed moving to and from a bed to a chair (including a wheelchair)?: A Little Help needed standing up from a chair using your arms (e.g., wheelchair or bedside chair)?: A Little Help needed to walk in hospital room?: A Little Help needed climbing 3-5 steps with a railing? : A Little 6 Click Score: 18    End of Session Equipment Utilized During Treatment: Gait belt Activity Tolerance: Patient tolerated treatment well Patient left: in chair;with call bell/phone within reach;with family/visitor present   PT Visit Diagnosis: Other abnormalities of gait and mobility (R26.89);History of falling (Z91.81);Pain Pain - Right/Left: Right Pain - part of body: Hip     Time: 1610-9604 PT Time Calculation (min) (ACUTE ONLY): 12 min  Charges:  $Gait Training: 8-22 mins                     Sheran Lawless, PT Acute Rehabilitation Services Office:[phone] 12/22/2022    Elray Mcgregor 12/22/2022, 11:03 AM

## 2022-12-22 NOTE — Plan of Care (Signed)

## 2022-12-22 NOTE — Progress Notes (Signed)
Subjective: CC: Sore over her R flank. Tolerating diet without vomiting. Some nausea this am. Passing flatus. BM yesterday and today. Voiding. Did 2 laps in the hallway yesterday - denies lightlessness or nausea associated with this.   Tachycardic in low 100's noted this am. On Metoprolol at baseline (currently held). No hypotension this am. Hgb stable at 7.6 this am.    Objective: Vital signs in last 24 hours: Temp:  [98.4 F (36.9 C)-98.9 F (37.2 C)] 98.5 F (36.9 C) (05/29 0325) Pulse Rate:  [104-110] 104 (05/29 0325) Resp:  [19-20] 20 (05/29 0325) BP: (124-130)/(69-70) 128/69 (05/29 0325) SpO2:  [98 %-100 %] 98 % (05/29 0325) Last BM Date : 12/21/22  Intake/Output from previous day: 05/28 0701 - 05/29 0700 In: 1080.1 [P.O.:580; IV Piggyback:500.1] Out: 2250 [Urine:2250] Intake/Output this shift: No intake/output data recorded.  PE: Gen:  Alert, NAD, pleasant HEENT: Forehead laceration cdi with sutures in place Card:  Tachycardic in low 100's, reg rhythm  Pulm:  CTAB, no W/R/R, effort normal Abd: Soft, ND, NT, R flank hematoma noted - abdominal binder reapplied.  Ext:  Spont MAE's. No LE edema.  Psych: A&Ox3   Lab Results:  Recent Labs    12/21/22 0048 12/22/22 0217  WBC 10.1 11.3*  HGB 7.6* 7.6*  HCT 22.6* 22.7*  PLT 160 170    BMET Recent Labs    12/21/22 0048 12/22/22 0217  NA 137 135  K 4.0 4.3  CL 113* 111  CO2 18* 15*  GLUCOSE 125* 139*  BUN 21 18  CREATININE 1.76* 1.56*  CALCIUM 6.7* 7.3*    PT/INR Recent Labs    12/19/22 1733  LABPROT 13.2  INR 1.0    CMP     Component Value Date/Time   NA 135 12/22/2022 0217   K 4.3 12/22/2022 0217   CL 111 12/22/2022 0217   CO2 15 (L) 12/22/2022 0217   GLUCOSE 139 (H) 12/22/2022 0217   BUN 18 12/22/2022 0217   CREATININE 1.56 (H) 12/22/2022 0217   CALCIUM 7.3 (L) 12/22/2022 0217   PROT 6.0 (L) 12/19/2022 1733   ALBUMIN 3.4 (L) 12/19/2022 1733   AST 31 12/19/2022 1733   ALT 33  12/19/2022 1733   ALKPHOS 26 (L) 12/19/2022 1733   BILITOT 0.5 12/19/2022 1733   GFRNONAA 35 (L) 12/22/2022 0217   GFRAA 27 (L) 01/14/2020 0504   Lipase     Component Value Date/Time   LIPASE 54 (H) 03/08/2022 1845    Studies/Results: No results found.  Anti-infectives: Anti-infectives (From admission, onward)    Start     Dose/Rate Route Frequency Ordered Stop   12/19/22 1745  ceFAZolin (ANCEF) IVPB 2g/100 mL premix        2 g 200 mL/hr over 30 Minutes Intravenous  Once 12/19/22 1734 12/19/22 1922        Assessment/Plan 72 yo female with blindness fell down stairs R flank hematoma - Abdominal binder, trend hgb ABL anemia - Hgb stable at 7.6 this am. Mild tachycardia this am. No hypotension. Restart home Metoprolol.  C6 rain drop fracture - Noted on CT.  CTA neck without arterial injury. MRI with no acute abnormality of the cervical spine. C-Collar off.  Laceration forehead - EDP PA repaired 5/26 Hypothyroidism - home meds HTN - Home Metoprolol.  DM - SSI FEN-  carb mod diet VTE- SCDs, hold prophylaxis due to hematoma ID- None currently. Afebrile.  Dispo- Possible PM d/c with HH. Per TOC  notes, her daughter is visiting from [place] to provide assistance as long as it is needed.   I reviewed nursing notes, last 24 h vitals and pain scores, last 48 h intake and output, last 24 h labs and trends, and last 24 h imaging results.   LOS: 3 days    Jacinto Halim , [facility] 12/22/2022, 8:37 AM Please see Amion for pager number during day hours 7:00am-4:30pm

## 2022-12-22 NOTE — Plan of Care (Signed)
  Problem: Education: Goal: Ability to describe self-care measures that may prevent or decrease complications (Diabetes Survival Skills Education) will improve Outcome: Progressing Goal: Individualized Educational Video(s) Outcome: Progressing   Problem: Coping: Goal: Ability to adjust to condition or change in health will improve Outcome: Progressing   Problem: Fluid Volume: Goal: Ability to maintain a balanced intake and output will improve Outcome: Progressing   Problem: Health Behavior/Discharge Planning: Goal: Ability to identify and utilize available resources and services will improve Outcome: Progressing Goal: Ability to manage health-related needs will improve Outcome: Progressing   Problem: Metabolic: Goal: Ability to maintain appropriate glucose levels will improve Outcome: Progressing   Problem: Nutritional: Goal: Maintenance of adequate nutrition will improve Outcome: Progressing Goal: Progress toward achieving an optimal weight will improve Outcome: Progressing   Problem: Skin Integrity: Goal: Risk for impaired skin integrity will decrease Outcome: Progressing   Problem: Tissue Perfusion: Goal: Adequacy of tissue perfusion will improve Outcome: Progressing   Problem: Education: Goal: Knowledge of General Education information will improve Description: Including pain rating scale, medication(s)/side effects and non-pharmacologic comfort measures Outcome: Progressing   Problem: Health Behavior/Discharge Planning: Goal: Ability to manage health-related needs will improve Outcome: Progressing   Problem: Clinical Measurements: Goal: Ability to maintain clinical measurements within normal limits will improve Outcome: Progressing Goal: Will remain free from infection Outcome: Progressing Goal: Diagnostic test results will improve Outcome: Progressing Goal: Respiratory complications will improve Outcome: Progressing Goal: Cardiovascular complication will  be avoided Outcome: Progressing   Problem: Activity: Goal: Risk for activity intolerance will decrease Outcome: Progressing   Problem: Nutrition: Goal: Adequate nutrition will be maintained Outcome: Progressing   Problem: Coping: Goal: Level of anxiety will decrease Outcome: Progressing   Problem: Elimination: Goal: Will not experience complications related to bowel motility Outcome: Progressing Goal: Will not experience complications related to urinary retention Outcome: Progressing   Problem: Pain Managment: Goal: General experience of comfort will improve Outcome: Progressing   Problem: Safety: Goal: Ability to remain free from injury will improve Outcome: Not Progressing Note: Patient blind at baseline   Problem: Skin Integrity: Goal: Risk for impaired skin integrity will decrease Outcome: Progressing

## 2022-12-22 NOTE — Discharge Summary (Signed)
Patient ID: Jessica [name] [id number] 1951/06/02 72 y.o.  Admit date: 12/19/2022 Discharge date: 12/22/2022  Discharge Diagnosis 72 yo female with blindness fell down stairs R flank hematoma  ABL anemia  Laceration forehead  Hx Hypothyroidism  Hx HTN  Consultants None  Reason for Admission: 72 yo female with blindness was reaching for the banister and missed and fell down the stairs. She complains of pain in her right side and forehead.   Procedures Laceration repair of forehead by EDP on 5/26 (per EDP MD note it appears Zelaya PA-C repaired)  Hospital Course:  Patient presented as above after a fall. She was found to have a R flank hematoma, forehead laceration that was closed by EDP and possible C6 rain drop fracture. Possible C6 fx was evaluated further w/ CTA neck that was without arterial injury and MRI with no acute abnormality of the cervical spine. C-Collar was removed. She was admitted to the trauma service. Serial hgbs were monitored. She required 1U PRBC on 5/27. Hgb stabilized prior to discharge. Patient worked with therapies during admission who recommended HH. TOC arranged HH and DME. Her daughter plans to stay with her at d/c. On 5/29, the patient was voiding well, tolerating diet, ambulating well, pain well controlled, vital signs stable, incisions cdi and felt stable for discharge home. Discussed discharge instructions, restrictions and return/call back precautions. Follow up as noted below.   Allergies as of 12/22/2022       Reactions   Codeine Shortness Of Breath, Nausea And Vomiting   Erythromycin Nausea And Vomiting   Levaquin [levofloxacin] Shortness Of Breath   Motrin [ibuprofen] Other (See Comments)   Pt told to avoid ibuprofen due to her heart.   Ultram [tramadol] Nausea And Vomiting        Medication List     TAKE these medications    acetaminophen 325 MG tablet Commonly known as: TYLENOL Take 2 tablets (650 mg total) by mouth every 6 (six)  hours as needed. What changed: reasons to take this   CO Q 10 PO Take 1 capsule by mouth daily.   cyanocobalamin 1000 MCG/ML injection Commonly known as: VITAMIN B12 Inject 1,000 mcg into the muscle See admin instructions. 1000 mcg on the 10th of every month, every 30 days.   empagliflozin 10 MG Tabs tablet Commonly known as: JARDIANCE Take 1 tablet (10 mg total) by mouth daily.   famotidine 40 MG tablet Commonly known as: PEPCID TAKE 1 TABLET TWICE DAILY   hydrALAZINE 10 MG tablet Commonly known as: APRESOLINE Take 1 tablet (10 mg total) by mouth 3 (three) times daily as needed. hydralazine 10 mg as needed sbp greater than 160. Please  keep a blood pressure diary. What changed:  reasons to take this additional instructions   levothyroxine 50 MCG tablet Commonly known as: SYNTHROID Take 50 mcg by mouth daily before breakfast.   LORazepam 0.5 MG tablet Commonly known as: ATIVAN Take 0.5 mg by mouth 2 (two) times daily as needed for anxiety.   magnesium oxide 400 MG tablet Commonly known as: MAG-OX Take 400 mg by mouth daily.   methocarbamol 500 MG tablet Commonly known as: ROBAXIN Take 1 tablet (500 mg total) by mouth every 8 (eight) hours as needed for muscle spasms.   metoprolol succinate 25 MG 24 hr tablet Commonly known as: TOPROL-XL Take 1.5 tablets (37.5 mg total) by mouth daily.   nitroGLYCERIN 0.4 MG SL tablet Commonly known as: NITROSTAT Place 1 tablet (0.4 mg total)  under the tongue every 5 (five) minutes x 3 doses as needed for chest pain.   pantoprazole 40 MG tablet Commonly known as: PROTONIX Take 40 mg by mouth 2 (two) times daily.   PROBIOTIC PO Take 1 capsule by mouth 2 (two) times daily.   rosuvastatin 40 MG tablet Commonly known as: CRESTOR Take 1 tablet (40 mg total) by mouth daily. What changed: when to take this   sertraline 50 MG tablet Commonly known as: ZOLOFT Take 50 mg by mouth at bedtime.   Trulicity 1.5 MG/0.5ML Sopn Generic  drug: Dulaglutide Inject 1.5 mg into the skin every Wednesday.               Durable Medical Equipment  (From admission, onward)           Start     Ordered   12/22/22 0930  For home use only DME Walker rolling  Once       Question Answer Comment  Walker: With 5 Inch Wheels   Patient needs a walker to treat with the following condition Right flank hematoma      12/22/22 0929              Follow-up Information     [facility], [facility] Follow up.   Specialty: Home Health Services Why: Home health physical therapy; agency will call you to arrange appts. Contact information: [address] [place] [address] [phone]         Cleatis Polka., MD. Schedule an appointment as soon as possible for a visit in 1 week(s).   Specialty: Internal Medicine Why: For post hospilization follow up and repeat labs Contact information: [address] [place] [address] [phone]         [facility], [facility] Follow up on 12/27/2022.   Specialty: General Surgery Why: 2pm. This is a nurse visit for for suture removal. Please arrive 30 minutes prior to your appointment for paperwork. Please bring a copy of your photo ID and insurance card. Contact information: [address] [place] [address] [phone]                 Signed: Leary Roca, [facility] 12/22/2022, 12:40 PM Please see Amion for pager number during day hours 7:00am-4:30pm

## 2022-12-23 DIAGNOSIS — D62 Acute posthemorrhagic anemia: Secondary | ICD-10-CM | POA: Diagnosis not present

## 2022-12-23 DIAGNOSIS — H547 Unspecified visual loss: Secondary | ICD-10-CM | POA: Diagnosis not present

## 2022-12-23 DIAGNOSIS — E1122 Type 2 diabetes mellitus with diabetic chronic kidney disease: Secondary | ICD-10-CM | POA: Diagnosis not present

## 2022-12-23 DIAGNOSIS — I129 Hypertensive chronic kidney disease with stage 1 through stage 4 chronic kidney disease, or unspecified chronic kidney disease: Secondary | ICD-10-CM | POA: Diagnosis not present

## 2022-12-23 DIAGNOSIS — S301XXD Contusion of abdominal wall, subsequent encounter: Secondary | ICD-10-CM | POA: Diagnosis not present

## 2022-12-23 DIAGNOSIS — S0181XD Laceration without foreign body of other part of head, subsequent encounter: Secondary | ICD-10-CM | POA: Diagnosis not present

## 2022-12-23 DIAGNOSIS — E039 Hypothyroidism, unspecified: Secondary | ICD-10-CM | POA: Diagnosis not present

## 2022-12-23 DIAGNOSIS — N183 Chronic kidney disease, stage 3 unspecified: Secondary | ICD-10-CM | POA: Diagnosis not present

## 2022-12-23 DIAGNOSIS — M4802 Spinal stenosis, cervical region: Secondary | ICD-10-CM | POA: Diagnosis not present

## 2022-12-29 DIAGNOSIS — D62 Acute posthemorrhagic anemia: Secondary | ICD-10-CM | POA: Diagnosis not present

## 2022-12-29 DIAGNOSIS — K5900 Constipation, unspecified: Secondary | ICD-10-CM | POA: Diagnosis not present

## 2022-12-29 DIAGNOSIS — W108XXA Fall (on) (from) other stairs and steps, initial encounter: Secondary | ICD-10-CM | POA: Diagnosis not present

## 2022-12-29 DIAGNOSIS — E1129 Type 2 diabetes mellitus with other diabetic kidney complication: Secondary | ICD-10-CM | POA: Diagnosis not present

## 2022-12-29 DIAGNOSIS — I10 Essential (primary) hypertension: Secondary | ICD-10-CM | POA: Diagnosis not present

## 2022-12-29 DIAGNOSIS — M8580 Other specified disorders of bone density and structure, unspecified site: Secondary | ICD-10-CM | POA: Diagnosis not present

## 2022-12-29 DIAGNOSIS — R110 Nausea: Secondary | ICD-10-CM | POA: Diagnosis not present

## 2022-12-29 DIAGNOSIS — R350 Frequency of micturition: Secondary | ICD-10-CM | POA: Diagnosis not present

## 2022-12-30 DIAGNOSIS — I129 Hypertensive chronic kidney disease with stage 1 through stage 4 chronic kidney disease, or unspecified chronic kidney disease: Secondary | ICD-10-CM | POA: Diagnosis not present

## 2022-12-30 DIAGNOSIS — D62 Acute posthemorrhagic anemia: Secondary | ICD-10-CM | POA: Diagnosis not present

## 2022-12-30 DIAGNOSIS — M4802 Spinal stenosis, cervical region: Secondary | ICD-10-CM | POA: Diagnosis not present

## 2022-12-30 DIAGNOSIS — H547 Unspecified visual loss: Secondary | ICD-10-CM | POA: Diagnosis not present

## 2022-12-30 DIAGNOSIS — E1122 Type 2 diabetes mellitus with diabetic chronic kidney disease: Secondary | ICD-10-CM | POA: Diagnosis not present

## 2022-12-30 DIAGNOSIS — S0181XD Laceration without foreign body of other part of head, subsequent encounter: Secondary | ICD-10-CM | POA: Diagnosis not present

## 2022-12-30 DIAGNOSIS — E039 Hypothyroidism, unspecified: Secondary | ICD-10-CM | POA: Diagnosis not present

## 2022-12-30 DIAGNOSIS — N183 Chronic kidney disease, stage 3 unspecified: Secondary | ICD-10-CM | POA: Diagnosis not present

## 2022-12-30 DIAGNOSIS — S301XXD Contusion of abdominal wall, subsequent encounter: Secondary | ICD-10-CM | POA: Diagnosis not present

## 2022-12-31 DIAGNOSIS — S0181XD Laceration without foreign body of other part of head, subsequent encounter: Secondary | ICD-10-CM | POA: Diagnosis not present

## 2022-12-31 DIAGNOSIS — E039 Hypothyroidism, unspecified: Secondary | ICD-10-CM | POA: Diagnosis not present

## 2022-12-31 DIAGNOSIS — D62 Acute posthemorrhagic anemia: Secondary | ICD-10-CM | POA: Diagnosis not present

## 2022-12-31 DIAGNOSIS — E1122 Type 2 diabetes mellitus with diabetic chronic kidney disease: Secondary | ICD-10-CM | POA: Diagnosis not present

## 2022-12-31 DIAGNOSIS — S301XXD Contusion of abdominal wall, subsequent encounter: Secondary | ICD-10-CM | POA: Diagnosis not present

## 2022-12-31 DIAGNOSIS — H547 Unspecified visual loss: Secondary | ICD-10-CM | POA: Diagnosis not present

## 2022-12-31 DIAGNOSIS — M4802 Spinal stenosis, cervical region: Secondary | ICD-10-CM | POA: Diagnosis not present

## 2022-12-31 DIAGNOSIS — I129 Hypertensive chronic kidney disease with stage 1 through stage 4 chronic kidney disease, or unspecified chronic kidney disease: Secondary | ICD-10-CM | POA: Diagnosis not present

## 2022-12-31 DIAGNOSIS — N183 Chronic kidney disease, stage 3 unspecified: Secondary | ICD-10-CM | POA: Diagnosis not present

## 2022-12-31 NOTE — Progress Notes (Signed)
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

## 2023-01-06 DIAGNOSIS — H547 Unspecified visual loss: Secondary | ICD-10-CM | POA: Diagnosis not present

## 2023-01-06 DIAGNOSIS — N183 Chronic kidney disease, stage 3 unspecified: Secondary | ICD-10-CM | POA: Diagnosis not present

## 2023-01-06 DIAGNOSIS — S301XXD Contusion of abdominal wall, subsequent encounter: Secondary | ICD-10-CM | POA: Diagnosis not present

## 2023-01-06 DIAGNOSIS — E039 Hypothyroidism, unspecified: Secondary | ICD-10-CM | POA: Diagnosis not present

## 2023-01-06 DIAGNOSIS — I129 Hypertensive chronic kidney disease with stage 1 through stage 4 chronic kidney disease, or unspecified chronic kidney disease: Secondary | ICD-10-CM | POA: Diagnosis not present

## 2023-01-06 DIAGNOSIS — S0181XD Laceration without foreign body of other part of head, subsequent encounter: Secondary | ICD-10-CM | POA: Diagnosis not present

## 2023-01-06 DIAGNOSIS — D62 Acute posthemorrhagic anemia: Secondary | ICD-10-CM | POA: Diagnosis not present

## 2023-01-06 DIAGNOSIS — M4802 Spinal stenosis, cervical region: Secondary | ICD-10-CM | POA: Diagnosis not present

## 2023-01-06 DIAGNOSIS — E1122 Type 2 diabetes mellitus with diabetic chronic kidney disease: Secondary | ICD-10-CM | POA: Diagnosis not present

## 2023-01-12 DIAGNOSIS — S301XXD Contusion of abdominal wall, subsequent encounter: Secondary | ICD-10-CM | POA: Diagnosis not present

## 2023-01-12 DIAGNOSIS — H547 Unspecified visual loss: Secondary | ICD-10-CM | POA: Diagnosis not present

## 2023-01-12 DIAGNOSIS — S0181XD Laceration without foreign body of other part of head, subsequent encounter: Secondary | ICD-10-CM | POA: Diagnosis not present

## 2023-01-12 DIAGNOSIS — M4802 Spinal stenosis, cervical region: Secondary | ICD-10-CM | POA: Diagnosis not present

## 2023-01-12 DIAGNOSIS — D62 Acute posthemorrhagic anemia: Secondary | ICD-10-CM | POA: Diagnosis not present

## 2023-01-12 DIAGNOSIS — I129 Hypertensive chronic kidney disease with stage 1 through stage 4 chronic kidney disease, or unspecified chronic kidney disease: Secondary | ICD-10-CM | POA: Diagnosis not present

## 2023-01-12 DIAGNOSIS — N183 Chronic kidney disease, stage 3 unspecified: Secondary | ICD-10-CM | POA: Diagnosis not present

## 2023-01-12 DIAGNOSIS — E1122 Type 2 diabetes mellitus with diabetic chronic kidney disease: Secondary | ICD-10-CM | POA: Diagnosis not present

## 2023-01-12 DIAGNOSIS — E039 Hypothyroidism, unspecified: Secondary | ICD-10-CM | POA: Diagnosis not present

## 2023-01-13 NOTE — Addendum Note (Signed)
Addended by: Jethro Bolus A on: 01/13/2023 02:04 PM   Modules accepted: Orders

## 2023-01-14 DIAGNOSIS — E1122 Type 2 diabetes mellitus with diabetic chronic kidney disease: Secondary | ICD-10-CM | POA: Diagnosis not present

## 2023-01-14 DIAGNOSIS — S0181XD Laceration without foreign body of other part of head, subsequent encounter: Secondary | ICD-10-CM | POA: Diagnosis not present

## 2023-01-14 DIAGNOSIS — I129 Hypertensive chronic kidney disease with stage 1 through stage 4 chronic kidney disease, or unspecified chronic kidney disease: Secondary | ICD-10-CM | POA: Diagnosis not present

## 2023-01-14 DIAGNOSIS — D62 Acute posthemorrhagic anemia: Secondary | ICD-10-CM | POA: Diagnosis not present

## 2023-01-14 DIAGNOSIS — N183 Chronic kidney disease, stage 3 unspecified: Secondary | ICD-10-CM | POA: Diagnosis not present

## 2023-01-14 DIAGNOSIS — H547 Unspecified visual loss: Secondary | ICD-10-CM | POA: Diagnosis not present

## 2023-01-14 DIAGNOSIS — M4802 Spinal stenosis, cervical region: Secondary | ICD-10-CM | POA: Diagnosis not present

## 2023-01-14 DIAGNOSIS — S301XXD Contusion of abdominal wall, subsequent encounter: Secondary | ICD-10-CM | POA: Diagnosis not present

## 2023-01-14 DIAGNOSIS — E039 Hypothyroidism, unspecified: Secondary | ICD-10-CM | POA: Diagnosis not present

## 2023-01-18 DIAGNOSIS — M4802 Spinal stenosis, cervical region: Secondary | ICD-10-CM | POA: Diagnosis not present

## 2023-01-18 DIAGNOSIS — E1122 Type 2 diabetes mellitus with diabetic chronic kidney disease: Secondary | ICD-10-CM | POA: Diagnosis not present

## 2023-01-18 DIAGNOSIS — D62 Acute posthemorrhagic anemia: Secondary | ICD-10-CM | POA: Diagnosis not present

## 2023-01-18 DIAGNOSIS — S0181XD Laceration without foreign body of other part of head, subsequent encounter: Secondary | ICD-10-CM | POA: Diagnosis not present

## 2023-01-18 DIAGNOSIS — E039 Hypothyroidism, unspecified: Secondary | ICD-10-CM | POA: Diagnosis not present

## 2023-01-18 DIAGNOSIS — N183 Chronic kidney disease, stage 3 unspecified: Secondary | ICD-10-CM | POA: Diagnosis not present

## 2023-01-18 DIAGNOSIS — I129 Hypertensive chronic kidney disease with stage 1 through stage 4 chronic kidney disease, or unspecified chronic kidney disease: Secondary | ICD-10-CM | POA: Diagnosis not present

## 2023-01-18 DIAGNOSIS — S301XXD Contusion of abdominal wall, subsequent encounter: Secondary | ICD-10-CM | POA: Diagnosis not present

## 2023-01-18 DIAGNOSIS — H547 Unspecified visual loss: Secondary | ICD-10-CM | POA: Diagnosis not present

## 2023-01-19 DIAGNOSIS — E1122 Type 2 diabetes mellitus with diabetic chronic kidney disease: Secondary | ICD-10-CM | POA: Diagnosis not present

## 2023-01-19 DIAGNOSIS — D62 Acute posthemorrhagic anemia: Secondary | ICD-10-CM | POA: Diagnosis not present

## 2023-01-19 DIAGNOSIS — I129 Hypertensive chronic kidney disease with stage 1 through stage 4 chronic kidney disease, or unspecified chronic kidney disease: Secondary | ICD-10-CM | POA: Diagnosis not present

## 2023-01-19 DIAGNOSIS — S301XXD Contusion of abdominal wall, subsequent encounter: Secondary | ICD-10-CM | POA: Diagnosis not present

## 2023-01-19 DIAGNOSIS — H547 Unspecified visual loss: Secondary | ICD-10-CM | POA: Diagnosis not present

## 2023-01-19 DIAGNOSIS — M4802 Spinal stenosis, cervical region: Secondary | ICD-10-CM | POA: Diagnosis not present

## 2023-01-19 DIAGNOSIS — S0181XD Laceration without foreign body of other part of head, subsequent encounter: Secondary | ICD-10-CM | POA: Diagnosis not present

## 2023-01-19 DIAGNOSIS — E039 Hypothyroidism, unspecified: Secondary | ICD-10-CM | POA: Diagnosis not present

## 2023-01-19 DIAGNOSIS — N183 Chronic kidney disease, stage 3 unspecified: Secondary | ICD-10-CM | POA: Diagnosis not present

## 2023-01-25 DIAGNOSIS — H547 Unspecified visual loss: Secondary | ICD-10-CM | POA: Diagnosis not present

## 2023-01-25 DIAGNOSIS — S301XXD Contusion of abdominal wall, subsequent encounter: Secondary | ICD-10-CM | POA: Diagnosis not present

## 2023-01-25 DIAGNOSIS — M4802 Spinal stenosis, cervical region: Secondary | ICD-10-CM | POA: Diagnosis not present

## 2023-01-25 DIAGNOSIS — D62 Acute posthemorrhagic anemia: Secondary | ICD-10-CM | POA: Diagnosis not present

## 2023-01-25 DIAGNOSIS — E1122 Type 2 diabetes mellitus with diabetic chronic kidney disease: Secondary | ICD-10-CM | POA: Diagnosis not present

## 2023-01-25 DIAGNOSIS — S0181XD Laceration without foreign body of other part of head, subsequent encounter: Secondary | ICD-10-CM | POA: Diagnosis not present

## 2023-01-25 DIAGNOSIS — E039 Hypothyroidism, unspecified: Secondary | ICD-10-CM | POA: Diagnosis not present

## 2023-01-25 DIAGNOSIS — I129 Hypertensive chronic kidney disease with stage 1 through stage 4 chronic kidney disease, or unspecified chronic kidney disease: Secondary | ICD-10-CM | POA: Diagnosis not present

## 2023-01-25 DIAGNOSIS — N183 Chronic kidney disease, stage 3 unspecified: Secondary | ICD-10-CM | POA: Diagnosis not present

## 2023-02-01 DIAGNOSIS — H547 Unspecified visual loss: Secondary | ICD-10-CM | POA: Diagnosis not present

## 2023-02-01 DIAGNOSIS — I129 Hypertensive chronic kidney disease with stage 1 through stage 4 chronic kidney disease, or unspecified chronic kidney disease: Secondary | ICD-10-CM | POA: Diagnosis not present

## 2023-02-01 DIAGNOSIS — E039 Hypothyroidism, unspecified: Secondary | ICD-10-CM | POA: Diagnosis not present

## 2023-02-01 DIAGNOSIS — N183 Chronic kidney disease, stage 3 unspecified: Secondary | ICD-10-CM | POA: Diagnosis not present

## 2023-02-01 DIAGNOSIS — M4802 Spinal stenosis, cervical region: Secondary | ICD-10-CM | POA: Diagnosis not present

## 2023-02-01 DIAGNOSIS — E1122 Type 2 diabetes mellitus with diabetic chronic kidney disease: Secondary | ICD-10-CM | POA: Diagnosis not present

## 2023-02-01 DIAGNOSIS — S301XXD Contusion of abdominal wall, subsequent encounter: Secondary | ICD-10-CM | POA: Diagnosis not present

## 2023-02-01 DIAGNOSIS — S0181XD Laceration without foreign body of other part of head, subsequent encounter: Secondary | ICD-10-CM | POA: Diagnosis not present

## 2023-02-01 DIAGNOSIS — D62 Acute posthemorrhagic anemia: Secondary | ICD-10-CM | POA: Diagnosis not present

## 2023-02-08 ENCOUNTER — Encounter: Payer: Self-pay | Admitting: Nurse Practitioner

## 2023-02-08 ENCOUNTER — Ambulatory Visit: Payer: Medicare HMO | Attending: Nurse Practitioner | Admitting: Nurse Practitioner

## 2023-02-08 ENCOUNTER — Telehealth: Payer: Self-pay

## 2023-02-08 VITALS — BP 140/76 | HR 70 | Ht 60.0 in | Wt 114.6 lb

## 2023-02-08 DIAGNOSIS — E118 Type 2 diabetes mellitus with unspecified complications: Secondary | ICD-10-CM

## 2023-02-08 DIAGNOSIS — I952 Hypotension due to drugs: Secondary | ICD-10-CM | POA: Diagnosis not present

## 2023-02-08 DIAGNOSIS — R002 Palpitations: Secondary | ICD-10-CM

## 2023-02-08 DIAGNOSIS — N183 Chronic kidney disease, stage 3 unspecified: Secondary | ICD-10-CM | POA: Diagnosis not present

## 2023-02-08 DIAGNOSIS — I2510 Atherosclerotic heart disease of native coronary artery without angina pectoris: Secondary | ICD-10-CM | POA: Diagnosis not present

## 2023-02-08 DIAGNOSIS — I10 Essential (primary) hypertension: Secondary | ICD-10-CM | POA: Diagnosis not present

## 2023-02-08 DIAGNOSIS — E039 Hypothyroidism, unspecified: Secondary | ICD-10-CM | POA: Diagnosis not present

## 2023-02-08 DIAGNOSIS — E785 Hyperlipidemia, unspecified: Secondary | ICD-10-CM

## 2023-02-08 MED ORDER — NITROGLYCERIN 0.4 MG SL SUBL: 0.4000 mg | SUBLINGUAL_TABLET | SUBLINGUAL | 3 refills | Status: AC | PRN

## 2023-02-08 MED ORDER — HYDRALAZINE HCL 10 MG PO TABS: 10.0000 mg | ORAL_TABLET | Freq: Three times a day (TID) | ORAL | 3 refills | Status: AC | PRN

## 2023-02-08 NOTE — Patient Instructions (Signed)
 Medication Instructions:  Your physician recommends that you continue on your current medications as directed. Please refer to the Current Medication list given to you today.   *If you need a refill on your cardiac medications before your next appointment, please call your pharmacy*  Follow-Up: At [facility], you and your health needs are our priority.  As part of our continuing mission to provide you with exceptional heart care, we have created designated Provider Care Teams.  These Care Teams include your primary Cardiologist (physician) and Advanced Practice Providers (APPs -  Physician Assistants and Nurse Practitioners) who all work together to provide you with the care you need, when you need it.  We recommend signing up for the patient portal called "MyChart".  Sign up information is provided on this After Visit Summary.  MyChart is used to connect with patients for Virtual Visits (Telemedicine).  Patients are able to view lab/test results, encounter notes, upcoming appointments, etc.  Non-urgent messages can be sent to your provider as well.   To learn more about what you can do with MyChart, go to [email or web address].    Your next appointment:   6 month(s)  Provider:   Shelva Majestic, MD

## 2023-02-08 NOTE — Telephone Encounter (Signed)
Left message on pt's home number regarding restarting aspirin per Bernadene Person, NP.   Also, tried cell number, connected with pt's daughter, Bjorn Loser, instructions regarding aspirin 81mg  once daily. She will help relay the message.

## 2023-02-08 NOTE — Progress Notes (Signed)
Office Visit    Patient Name: Jessica [name] Date of Encounter: 02/08/2023  Primary Care Provider:  Cleatis Polka., MD Primary Cardiologist:  Nicki Guadalajara, MD  Chief Complaint    72 year old female with a history of CAD s/p STEMI in 12/2019, s/p balloon angioplasty to dPDA, palpitations, hypertension, hyperlipidemia, type 2 diabetes, CKD stage III, hypothyroidism, and blindness since birth who presents for follow-up related to palpitations.   Past Medical History    Past Medical History:  Diagnosis Date   Anemia    Anxiety    CKD (chronic kidney disease)    CKD III per PCP notes-pt denies   Congenital blindness    Diabetes mellitus    Diverticula of small intestine    Esophageal stricture    GERD (gastroesophageal reflux disease)    H/O hiatal hernia    Hyperlipidemia    Hypertension    Hypothyroidism    Iron deficiency anemia    Osteopenia    Osteoporosis    Postoperative nausea and vomiting    Retinitis    STEMI (ST elevation myocardial infarction) (HCC)    SVT (supraventricular tachycardia)    Vitamin B 12 deficiency    Past Surgical History:  Procedure Laterality Date   BACK SURGERY     CARPAL TUNNEL RELEASE     Right    CESAREAN SECTION     CHOLECYSTECTOMY  07/27/1983   COLONOSCOPY     CORONARY BALLOON ANGIOPLASTY N/A 12/30/2019   Procedure: CORONARY BALLOON ANGIOPLASTY;  Surgeon: Lennette Bihari, MD;  Location: [facility];  Service: Cardiovascular;  Laterality: N/A;   CORONARY/GRAFT ACUTE MI REVASCULARIZATION N/A 12/30/2019   Procedure: Coronary/Graft Acute MI Revascularization;  Surgeon: Lennette Bihari, MD;  Location: [facility];  Service: Cardiovascular;  Laterality: N/A;   HERNIA REPAIR     pt denies   LEFT HEART CATH AND CORONARY ANGIOGRAPHY N/A 12/30/2019   Procedure: LEFT HEART CATH AND CORONARY ANGIOGRAPHY;  Surgeon: Lennette Bihari, MD;  Location: [facility];  Service: Cardiovascular;  Laterality: N/A;   Right knee  arthroscopy  07/27/1991   Right thyroidectomy  07/26/2005   TOTAL ABDOMINAL HYSTERECTOMY W/ BILATERAL SALPINGOOPHORECTOMY  07/27/1991   UPPER GASTROINTESTINAL ENDOSCOPY      Allergies  Allergies  Allergen Reactions   Codeine Shortness Of Breath and Nausea And Vomiting   Erythromycin Nausea And Vomiting   Levaquin [Levofloxacin] Shortness Of Breath   Motrin [Ibuprofen] Other (See Comments)    Pt told to avoid ibuprofen due to her heart.   Ultram [Tramadol] Nausea And Vomiting     Labs/Other Studies Reviewed    The following studies were reviewed today:  Cardiac Studies & Procedures   CARDIAC CATHETERIZATION  CARDIAC CATHETERIZATION 12/30/2019  Narrative  Prox LAD lesion is 30% stenosed.  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  Post intervention, there is a 5% residual stenosis.  Mild nonobstructive stenosis of 30% in the proximal LAD in the region of the first diagonal and septal perforating artery.  Normal ramus intermediate and left circumflex coronary arteries.  Dominant RCA with total mid distal occlusion of the PDA vessel.  LVEDP 22 mmHg.  RECOMMENDATION: DAPT initially but since no stenting was performed duration may be less than 1 year.  Will DC bivalirudin several hours post procedure.  Hydrate.  Aggressive lipid-lowering therapy with target LDL less than 70 and optimal blood pressure control.  Findings Coronary Findings Diagnostic  Dominance: Right  Left Anterior Descending  Prox LAD lesion is 30% stenosed.  Right Coronary Artery  Right Posterior Descending Artery RPDA lesion is 100% stenosed.  Intervention  RPDA lesion Angioplasty Post-Intervention Lesion Assessment The intervention was successful. Pre-interventional TIMI flow is 0. Post-intervention TIMI flow is 3. No complications occurred at this lesion. There is a 5% residual stenosis post intervention.     ECHOCARDIOGRAM  ECHOCARDIOGRAM COMPLETE 09/29/2022  Narrative ECHOCARDIOGRAM  REPORT    Patient Name:   Jessica [name] Date of Exam: 09/29/2022 Medical Rec #:  [id number]     Height:       61.0 in Accession #:    [id number]    Weight:       111.0 lb Date of Birth:  1950/09/01    BSA:          1.471 m Patient Age:    58 years      BP:           117/67 mmHg Patient Gender: F             HR:           82 bpm. Exam Location:  Inpatient  Procedure: 2D Echo, Cardiac Doppler and Color Doppler  Indications:    Syncope R55  History:        Patient has prior history of Echocardiogram examinations, most recent 12/31/2019. CAD and Previous Myocardial Infarction; Risk Factors:Hypertension, Dyslipidemia and Diabetes. CKD, stage 4.  Sonographer:    Lucendia Herrlich Referring Phys: [id number] ALLISON L ELLIS  IMPRESSIONS   1. Left ventricular ejection fraction, by estimation, is 60 to 65%. The left ventricle has normal function. The left ventricle has no regional wall motion abnormalities. Left ventricular diastolic parameters were normal. 2. Right ventricular systolic function is normal. The right ventricular size is normal. 3. The mitral valve is normal in structure. No evidence of mitral valve regurgitation. No evidence of mitral stenosis. 4. The aortic valve is normal in structure. Aortic valve regurgitation is not visualized. No aortic stenosis is present. 5. The inferior vena cava is normal in size with greater than 50% respiratory variability, suggesting right atrial pressure of 3 mmHg.  FINDINGS Left Ventricle: Left ventricular ejection fraction, by estimation, is 60 to 65%. The left ventricle has normal function. The left ventricle has no regional wall motion abnormalities. The left ventricular internal cavity size was normal in size. There is no left ventricular hypertrophy. Left ventricular diastolic parameters were normal.  Right Ventricle: The right ventricular size is normal. No increase in right ventricular wall thickness. Right ventricular systolic function is  normal.  Left Atrium: Left atrial size was normal in size.  Right Atrium: Right atrial size was normal in size.  Pericardium: There is no evidence of pericardial effusion.  Mitral Valve: The mitral valve is normal in structure. No evidence of mitral valve regurgitation. No evidence of mitral valve stenosis.  Tricuspid Valve: The tricuspid valve is normal in structure. Tricuspid valve regurgitation is not demonstrated. No evidence of tricuspid stenosis.  Aortic Valve: The aortic valve is normal in structure. Aortic valve regurgitation is not visualized. No aortic stenosis is present. Aortic valve mean gradient measures 3.0 mmHg. Aortic valve peak gradient measures 5.1 mmHg. Aortic valve area, by VTI measures 1.85 cm.  Pulmonic Valve: The pulmonic valve was normal in structure. Pulmonic valve regurgitation is not visualized. No evidence of pulmonic stenosis.  Aorta: The aortic root is normal in size and structure.  Venous: The inferior vena cava is normal in size with  greater than 50% respiratory variability, suggesting right atrial pressure of 3 mmHg.  IAS/Shunts: No atrial level shunt detected by color flow Doppler.   LEFT VENTRICLE PLAX 2D LVIDd:         4.10 cm   Diastology LVIDs:         2.80 cm   LV e' medial:    6.53 cm/s LV PW:         0.70 cm   LV E/e' medial:  9.6 LV IVS:        0.90 cm   LV e' lateral:   9.14 cm/s LVOT diam:     2.00 cm   LV E/e' lateral: 6.8 LV SV:         41 LV SV Index:   28 LVOT Area:     3.14 cm   RIGHT VENTRICLE            IVC RV S prime:     7.72 cm/s  IVC diam: 0.80 cm TAPSE (M-mode): 1.8 cm  LEFT ATRIUM             Index        RIGHT ATRIUM          Index LA diam:        2.80 cm 1.90 cm/m   RA Area:     6.64 cm LA Vol (A2C):   20.1 ml 13.67 ml/m  RA Volume:   8.61 ml  5.85 ml/m LA Vol (A4C):   22.3 ml 15.16 ml/m LA Biplane Vol: 22.8 ml 15.50 ml/m AORTIC VALVE AV Area (Vmax):    1.91 cm AV Area (Vmean):   1.87 cm AV Area  (VTI):     1.85 cm AV Vmax:           113.00 cm/s AV Vmean:          75.300 cm/s AV VTI:            0.219 m AV Peak Grad:      5.1 mmHg AV Mean Grad:      3.0 mmHg LVOT Vmax:         68.65 cm/s LVOT Vmean:        44.750 cm/s LVOT VTI:          0.129 m LVOT/AV VTI ratio: 0.59  AORTA Ao Root diam: 2.90 cm Ao Asc diam:  2.80 cm  MITRAL VALVE MV Area (PHT): 3.48 cm    SHUNTS MV Decel Time: 218 msec    Systemic VTI:  0.13 m MV E velocity: 62.40 cm/s  Systemic Diam: 2.00 cm MV A velocity: 94.10 cm/s MV E/A ratio:  0.66  Aditya Sabharwal Electronically signed by Dorthula Nettles Signature Date/Time: 09/29/2022/1:40:00 PM    Final    MONITORS  CARDIAC EVENT MONITOR 04/22/2022  Narrative Relatively normal 30-day event monitor from August 25 through April 17, 2022.  Patient was in sinus rhythm with a mean heart rate of 76 bpm.  The slowest heart rate was 61 bpm and the fastest heart rate was sinus tachycardia.  There were very rare isolated PACs and PVC.  No high-grade ectopy was demonstrated.  There were no episodes of atrial fibrillation or SVT.  There were no pauses.          Recent Labs: 03/08/2022: Magnesium 2.2 09/28/2022: TSH 6.543 12/19/2022: ALT 33 12/22/2022: BUN 18; Creatinine, Ser 1.56; Hemoglobin 7.6; Platelets 170; Potassium 4.3; Sodium 135  Recent Lipid Panel    Component Value Date/Time  CHOL 116 12/31/2019 0325   TRIG 216 (H) 12/31/2019 0325   HDL 26 (L) 12/31/2019 0325   CHOLHDL 4.5 12/31/2019 0325   VLDL 43 (H) 12/31/2019 0325   LDLCALC 47 12/31/2019 0325    History of Present Illness    72 year old female with the above past medical history including CAD s/p STEMI in 12/2019, s/p balloon angioplasty to dPDA, palpitations, hypertension, hyperlipidemia, type 2 diabetes, CKD stage III, hypothyroidism, and blindness since birth.   She was hospitalized in 12/2019 in the setting of STEMI.  Emergent cardiac catheterization revealed 100% RPDA stenosis treated  with balloon angioplasty, otherwise 30% pLAD stenosis which was managed medically.  Echocardiogram in 12/2019 showed EF 50 to 55%, mild LVH, moderate hypokinesis of inferior wall, apical, and inferoseptal wall, G1 DD, no significant valvular abnormalities.  She remained on aspirin and Brilinta, however, Brilinta was later discontinued following blunt trauma to her abdomen which save resulted in significant abdominal wall hematoma requiring blood transfusion. She presented to the ED on 03/08/2022 in the setting of palpitations, generalized weakness, pain in both arms.  She noted elevated BP.  Troponin was mildly elevated with flat trend, cardiology was consulted.  She denied exertional symptoms concerning for angina.  Telemetry did not reveal any significant arrhythmia.  30 day event monitor from August to September 2023 revealed normal sinus rhythm, isolated PACs and PVCs, no atrial fibrillation or SVT, no significant pauses. Most recent echocardiogram in 09/2022 showed EF 60 to 65%, normal LV function, no RWMA, normal RV, no significant valvular abnormalities. She was last seen in the office on 10/15/2022 and was stable from a cardiac standpoint.  She reported multiple falls.  Metoprolol was decreased to 37.5 mg daily in the setting of hypotension. She was hospitalized in May 2024 following a mechanical fall due to loss of balance (she was reaching for the stair banister rail and missed). She fell down her stairs and suffered a laceration to her forehead s/p laceration repair, right flank hematoma.  Aspirin was held in the setting of significant anemia, hematoma.  She presents today for follow-up accompanied by her husband. Since her last visit she has been stable from a cardiac standpoint.  She denies any recurrent falls, denies palpitations, presyncope, or syncope.  BP has been stable.  She denies chest pain, she does note mildly increased dyspnea on exertion since her fall.  She denies any edema, PND, orthopnea,  weight gain.  She is still experiencing some pain as a result of her injuries from her fall.  She notes that she and her husband have moved to the downstairs level of their house. Other than her mild dyspnea on exertion, she reports feeling well.  Home Medications    Current Outpatient Medications  Medication Sig Dispense Refill   Coenzyme Q10 (CO Q 10 PO) Take 1 capsule by mouth daily.     cyanocobalamin (,VITAMIN B-12,) 1000 MCG/ML injection Inject 1,000 mcg into the muscle See admin instructions. 1000 mcg on the 10th of every month, every 30 days.     Dulaglutide (TRULICITY) 1.5 MG/0.5ML SOPN Inject 1.5 mg into the skin every Wednesday.     empagliflozin (JARDIANCE) 10 MG TABS tablet Take 1 tablet (10 mg total) by mouth daily. 30 tablet 6   famotidine (PEPCID) 40 MG tablet TAKE 1 TABLET TWICE DAILY 180 tablet 3   levothyroxine (SYNTHROID) 50 MCG tablet Take 50 mcg by mouth daily before breakfast.     LORazepam (ATIVAN) 0.5 MG tablet Take 0.5 mg  by mouth 2 (two) times daily as needed for anxiety.     magnesium oxide (MAG-OX) 400 MG tablet Take 400 mg by mouth daily.     metoprolol succinate (TOPROL-XL) 25 MG 24 hr tablet Take 1.5 tablets (37.5 mg total) by mouth daily. 135 tablet 3   pantoprazole (PROTONIX) 40 MG tablet Take 40 mg by mouth 2 (two) times daily.     Probiotic Product (PROBIOTIC PO) Take 1 capsule by mouth 2 (two) times daily.     rosuvastatin (CRESTOR) 40 MG tablet Take 1 tablet (40 mg total) by mouth daily. (Patient taking differently: Take 40 mg by mouth at bedtime.) 90 tablet 3   sertraline (ZOLOFT) 50 MG tablet Take 50 mg by mouth at bedtime.      acetaminophen (TYLENOL) 325 MG tablet Take 2 tablets (650 mg total) by mouth every 6 (six) hours as needed. (Patient not taking: Reported on 02/08/2023) 240 tablet 0   hydrALAZINE (APRESOLINE) 10 MG tablet Take 1 tablet (10 mg total) by mouth 3 (three) times daily as needed. hydralazine 10 mg as needed systolic blood pressure (top  number) greater than 160. 120 tablet 3   methocarbamol (ROBAXIN) 500 MG tablet Take 1 tablet (500 mg total) by mouth every 8 (eight) hours as needed for muscle spasms. (Patient not taking: Reported on 02/08/2023) 30 tablet 0   nitroGLYCERIN (NITROSTAT) 0.4 MG SL tablet Place 1 tablet (0.4 mg total) under the tongue every 5 (five) minutes x 3 doses as needed for chest pain. 25 tablet 3   ondansetron (ZOFRAN-ODT) 4 MG disintegrating tablet Take 4 mg by mouth every 6 (six) hours as needed. (Patient not taking: Reported on 02/08/2023)     No current facility-administered medications for this visit.     Review of Systems    She denies chest pain, palpitations, pnd, orthopnea, n, v, dizziness, syncope, edema, weight gain, or early satiety. All other systems reviewed and are otherwise negative except as noted above.   Physical Exam    VS:  BP (!) 140/76 (BP Location: Left Arm, Patient Position: Sitting, Cuff Size: Normal)   Pulse 70   Ht 5' (1.524 m)   Wt 114 lb 9.6 oz (52 kg)   SpO2 98%   BMI 22.38 kg/m   GEN: Well nourished, well developed, in no acute distress. HEENT: normal. Neck: Supple, no JVD, carotid bruits, or masses. Cardiac: RRR, no murmurs, rubs, or gallops. No clubbing, cyanosis, edema.  Radials/DP/PT 2+ and equal bilaterally.  Respiratory:  Respirations regular and unlabored, clear to auscultation bilaterally. GI: Soft, nontender, nondistended, BS + x 4. MS: no deformity or atrophy. Skin: warm and dry, no rash. Neuro:  Strength and sensation are intact. Psych: Normal affect.  Accessory Clinical Findings    ECG personally reviewed by me today -    - no EKG in office today.   Lab Results  Component Value Date   WBC 11.3 (H) 12/22/2022   HGB 7.6 (L) 12/22/2022   HCT 22.7 (L) 12/22/2022   MCV 89.4 12/22/2022   PLT 170 12/22/2022   Lab Results  Component Value Date   CREATININE 1.56 (H) 12/22/2022   BUN 18 12/22/2022   NA 135 12/22/2022   K 4.3 12/22/2022   CL 111  12/22/2022   CO2 15 (L) 12/22/2022   Lab Results  Component Value Date   ALT 33 12/19/2022   AST 31 12/19/2022   ALKPHOS 26 (L) 12/19/2022   BILITOT 0.5 12/19/2022   Lab Results  Component Value Date   CHOL 116 12/31/2019   HDL 26 (L) 12/31/2019   LDLCALC 47 12/31/2019   TRIG 216 (H) 12/31/2019   CHOLHDL 4.5 12/31/2019    Lab Results  Component Value Date   HGBA1C 7.7 (H) 09/28/2022    Assessment & Plan    1. Palpitations: She has a history of intermittent palpitations. 30 day event monitor from August to September 2023 revealed normal sinus rhythm, isolated PACs and PVCs, no atrial fibrillation or SVT, no significant pauses.  Denies any recent palpitations.  Continue metoprolol.    2. CAD: S/p STEMI, PTCA-RPDA in 2021. Stable with no anginal symptoms. No indication for ischemic evaluation. Continue ASA, metoprolol, Crestor.  She has noted some mild dyspnea on exertion following her recent fall.  She thinks this is related to her injuries that she sustained.  Continue to monitor symptoms. If dyspnea on exertion persists or worsens, could consider stress test.  Will defer for now. Aspirin was held in the setting of decreased hemoglobin, hematoma.  Most recent hemoglobin was stable at 9.4.  Will resume aspirin.  Continue metoprolol, as needed hydralazine, Crestor.  3. Hypertension: BP well controlled overall though she does note intermittently elevated BP, generally controlled with as needed hydralazine. Continue current antihypertensive regimen.    4. Hyperlipidemia: LDL was 41 in 07/2021.  Requested most recent labs from PCP, no recent LDL on file.  Consider repeat fasting lipid panel, LFTs at next follow-up visit.  Continue ASA, Crestor.    5. Type 2 diabetes: A1c was 7.1 in 02/2022. Monitored and managed per PCP.    6. CKD stage III: Creatine was 1.56 in 11/2022. Stable overall.    7. Hypothyroidism: TSH was 4.284 in 02/2022. Monitored and managed per PCP.    8. Disposition:  Follow-up in 6 months, sooner if needed.   Joylene Grapes, NP 02/08/2023, 1:45 PM

## 2023-02-11 DIAGNOSIS — N183 Chronic kidney disease, stage 3 unspecified: Secondary | ICD-10-CM | POA: Diagnosis not present

## 2023-02-11 DIAGNOSIS — I129 Hypertensive chronic kidney disease with stage 1 through stage 4 chronic kidney disease, or unspecified chronic kidney disease: Secondary | ICD-10-CM | POA: Diagnosis not present

## 2023-02-11 DIAGNOSIS — E1122 Type 2 diabetes mellitus with diabetic chronic kidney disease: Secondary | ICD-10-CM | POA: Diagnosis not present

## 2023-02-11 DIAGNOSIS — E039 Hypothyroidism, unspecified: Secondary | ICD-10-CM | POA: Diagnosis not present

## 2023-02-11 DIAGNOSIS — M4802 Spinal stenosis, cervical region: Secondary | ICD-10-CM | POA: Diagnosis not present

## 2023-02-11 DIAGNOSIS — S0181XD Laceration without foreign body of other part of head, subsequent encounter: Secondary | ICD-10-CM | POA: Diagnosis not present

## 2023-02-11 DIAGNOSIS — D62 Acute posthemorrhagic anemia: Secondary | ICD-10-CM | POA: Diagnosis not present

## 2023-02-11 DIAGNOSIS — H547 Unspecified visual loss: Secondary | ICD-10-CM | POA: Diagnosis not present

## 2023-02-11 DIAGNOSIS — S301XXD Contusion of abdominal wall, subsequent encounter: Secondary | ICD-10-CM | POA: Diagnosis not present

## 2023-02-17 DIAGNOSIS — D62 Acute posthemorrhagic anemia: Secondary | ICD-10-CM | POA: Diagnosis not present

## 2023-02-17 DIAGNOSIS — E1122 Type 2 diabetes mellitus with diabetic chronic kidney disease: Secondary | ICD-10-CM | POA: Diagnosis not present

## 2023-02-17 DIAGNOSIS — N183 Chronic kidney disease, stage 3 unspecified: Secondary | ICD-10-CM | POA: Diagnosis not present

## 2023-02-17 DIAGNOSIS — E039 Hypothyroidism, unspecified: Secondary | ICD-10-CM | POA: Diagnosis not present

## 2023-02-17 DIAGNOSIS — H547 Unspecified visual loss: Secondary | ICD-10-CM | POA: Diagnosis not present

## 2023-02-17 DIAGNOSIS — M4802 Spinal stenosis, cervical region: Secondary | ICD-10-CM | POA: Diagnosis not present

## 2023-02-17 DIAGNOSIS — S301XXD Contusion of abdominal wall, subsequent encounter: Secondary | ICD-10-CM | POA: Diagnosis not present

## 2023-02-17 DIAGNOSIS — I129 Hypertensive chronic kidney disease with stage 1 through stage 4 chronic kidney disease, or unspecified chronic kidney disease: Secondary | ICD-10-CM | POA: Diagnosis not present

## 2023-02-17 DIAGNOSIS — S0181XD Laceration without foreign body of other part of head, subsequent encounter: Secondary | ICD-10-CM | POA: Diagnosis not present

## 2023-02-21 DIAGNOSIS — E538 Deficiency of other specified B group vitamins: Secondary | ICD-10-CM | POA: Diagnosis not present

## 2023-02-21 DIAGNOSIS — E1129 Type 2 diabetes mellitus with other diabetic kidney complication: Secondary | ICD-10-CM | POA: Diagnosis not present

## 2023-02-21 DIAGNOSIS — E785 Hyperlipidemia, unspecified: Secondary | ICD-10-CM | POA: Diagnosis not present

## 2023-02-21 DIAGNOSIS — I700 Atherosclerosis of aorta: Secondary | ICD-10-CM | POA: Diagnosis not present

## 2023-02-21 DIAGNOSIS — D631 Anemia in chronic kidney disease: Secondary | ICD-10-CM | POA: Diagnosis not present

## 2023-02-21 DIAGNOSIS — I129 Hypertensive chronic kidney disease with stage 1 through stage 4 chronic kidney disease, or unspecified chronic kidney disease: Secondary | ICD-10-CM | POA: Diagnosis not present

## 2023-02-21 DIAGNOSIS — N184 Chronic kidney disease, stage 4 (severe): Secondary | ICD-10-CM | POA: Diagnosis not present

## 2023-02-21 DIAGNOSIS — M810 Age-related osteoporosis without current pathological fracture: Secondary | ICD-10-CM | POA: Diagnosis not present

## 2023-02-21 DIAGNOSIS — E039 Hypothyroidism, unspecified: Secondary | ICD-10-CM | POA: Diagnosis not present

## 2023-03-22 IMAGING — CT CT CHEST W/O CM
1 series · 12 of 34 positions shown, 15 images · non-contrast
Comparison: 08/01/2019, 01/12/2019 and 01/13/2007
COMPARISON: 08/01/2019, 01/12/2019 and 01/13/2007

Addendum:
CLINICAL DATA: Follow-up pulmonary nodule.

EXAM:
CT CHEST WITHOUT CONTRAST
TECHNIQUE: Multidetector CT imaging of the chest was performed following the
standard protocol without IV contrast.

[Series 2: chest w/(date) · axial · 0.65mm/px · z∈[-417,-135]mm · 12 of 167 slices shown, 15 images]
[im 13/167  mediastinal]
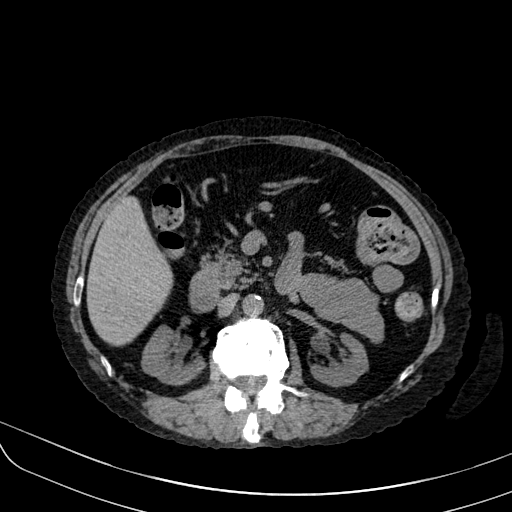
[im 13/167  lung]
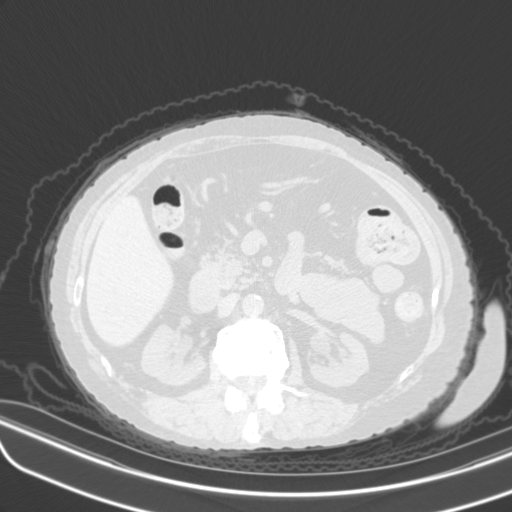
[im 31/167  lung]
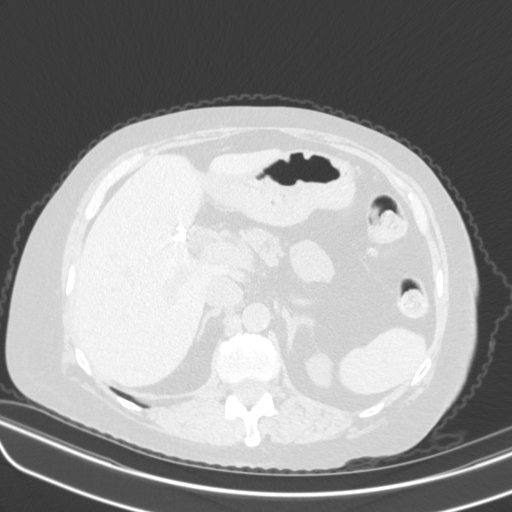
[im 37/167  lung]
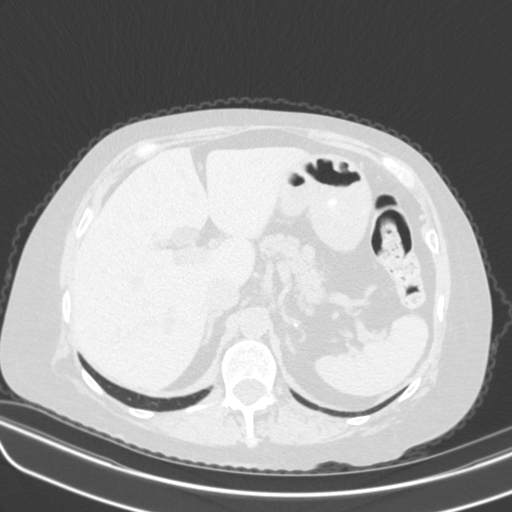
[im 56/167  lung]
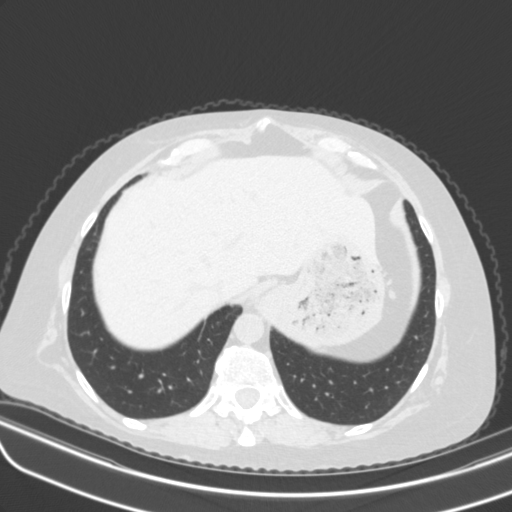
[im 68/167  mediastinal]
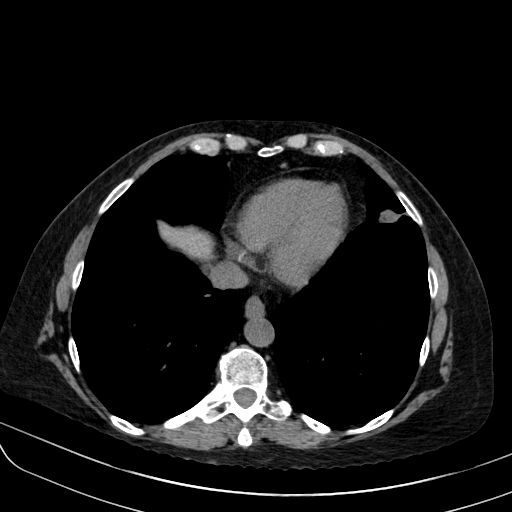
[im 68/167  lung]
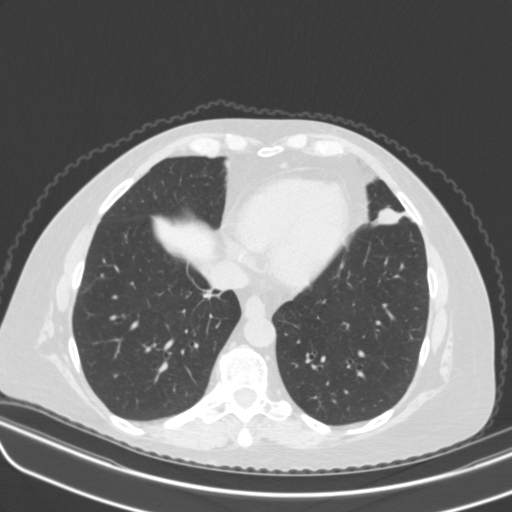
[im 79/167  lung]
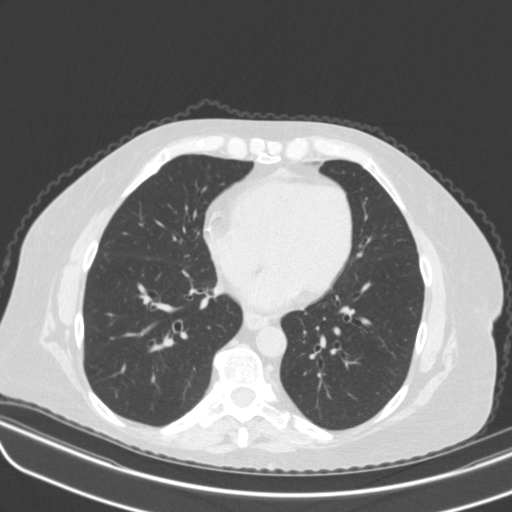
[im 89/167  lung]
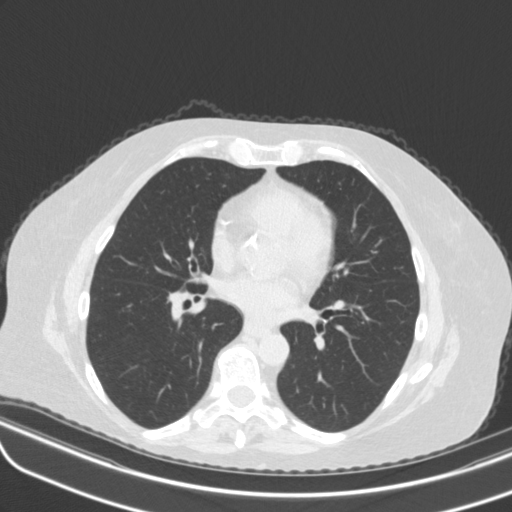
[im 100/167  lung]
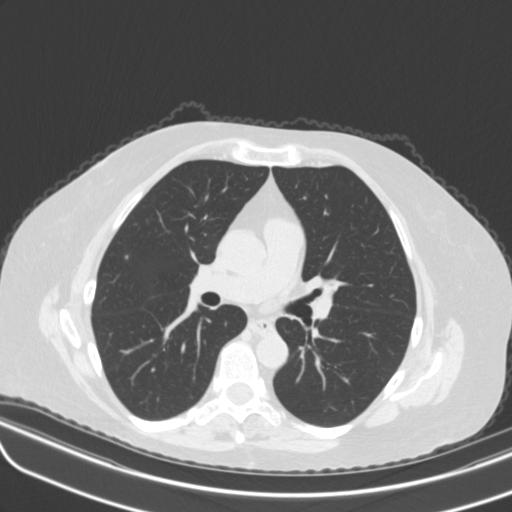
[im 111/167  mediastinal]
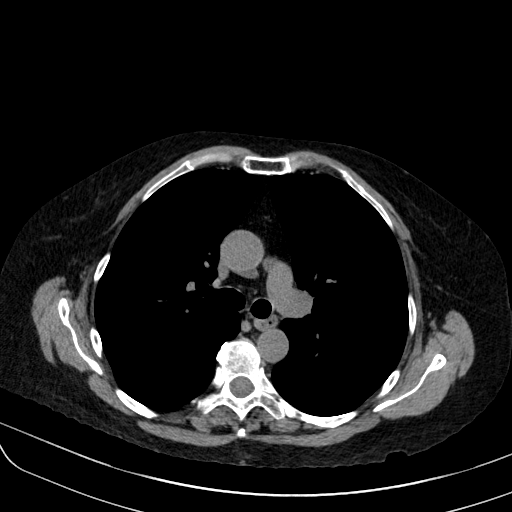
[im 111/167  lung]
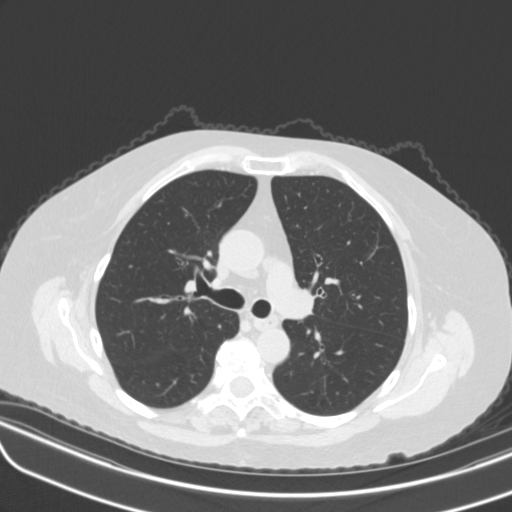
[im 130/167  lung]
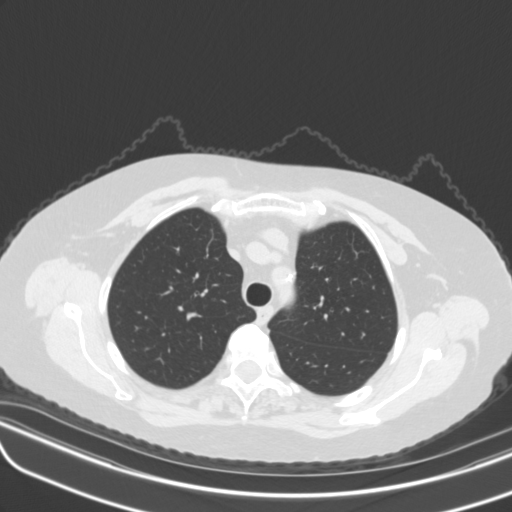
[im 142/167  lung]
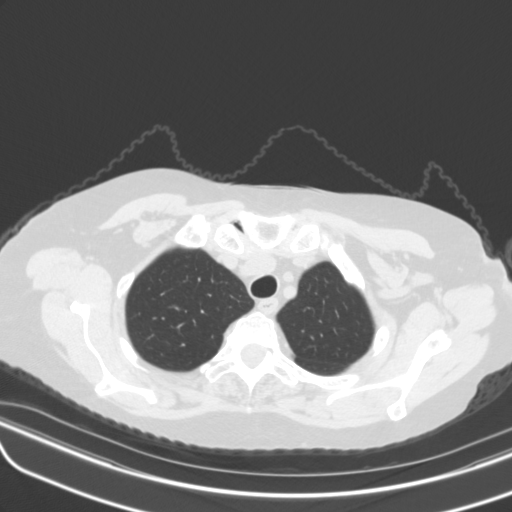
[im 154/167  lung]
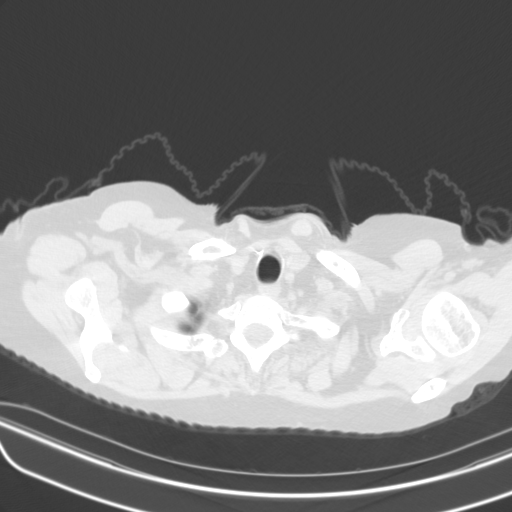

[12 of 34 positions shown; findings below may reference images not displayed]

FINDINGS: Cardiovascular: Coronary artery calcifications. Atherosclerotic
calcifications of the aortic root. Normal caliber of the thoracic
aorta. Heart size is normal. Minimal pericardial fluid.

Mediastinum/Nodes: Small mediastinal calcifications compatible with
old granulomatous disease. No significant lymph node enlargement in
the mediastinum. No axillary lymph node enlargement.

Lungs/Pleura: Again noted is a nodule in the lingula adjacent to the
left major fissure. Nodule was present in 3447 but has enlarged.
Nodule measures 1.5 x 1.2 x 1.2 cm and measured 1.5 x 1.1 x 1.2 cm
on 01/12/2019. Overall morphology of this nodule has minimally
changed in over 2 years. 2 mm nodule in the right upper lobe on
sequence 5 image 67 is minimally changed since 1818. There are new
small irregular branching densities in the right upper lobe on
sequence 5 images 65 and 64 and largest component measures roughly 6
mm. Findings are nonspecific but suggestive for infectious or
inflammatory process. There appears to be chronic mucous plugging
involving medial airways in the right lower lobe on sequence 5 image
97 and this mucous plugging has been present since 1818. Few chronic
peripheral opacities in the right lower lobe are suggestive for post
inflammatory changes. Stable small pleural-based nodular densities
in both lungs. No pleural effusions.

Upper Abdomen: Cholecystectomy. Mild fullness of the renal pelvis
bilaterally and minimally changed since 4144. No acute abnormality
in the upper abdomen. Mild fullness in the extrahepatic biliary
system appears stable and likely secondary to the cholecystectomy.

Musculoskeletal: Again noted is bone cement involving the L2
vertebral body compatible with previous augmentation procedure.
Chronic prominent Schmorl's node along the superior endplate of T12.
No acute bone abnormality.
IMPRESSION: 1. Lingula nodule is stable measuring up to 1.5 cm and minimally
changed since 7111. This nodule has slightly enlarged since 3447.
Based on the stability of this nodule, this is likely a benign
entity.
2. Small focus of new branching and nodular densities in the right
upper lobe are suggestive for mucoid impaction and likely represent
an infectious or inflammatory process.
3. Aortic Atherosclerosis ([id number]). Coronary artery
calcifications.

ADDENDUM:
Correction to Impression #1: Lingula nodule is stable since 1818
rather than 7111.

*** End of Addendum ***
FINDINGS: Cardiovascular: Coronary artery calcifications. Atherosclerotic
calcifications of the aortic root. Normal caliber of the thoracic
aorta. Heart size is normal. Minimal pericardial fluid.

Mediastinum/Nodes: Small mediastinal calcifications compatible with
old granulomatous disease. No significant lymph node enlargement in
the mediastinum. No axillary lymph node enlargement.

Lungs/Pleura: Again noted is a nodule in the lingula adjacent to the
left major fissure. Nodule was present in 3447 but has enlarged.
Nodule measures 1.5 x 1.2 x 1.2 cm and measured 1.5 x 1.1 x 1.2 cm
on 01/12/2019. Overall morphology of this nodule has minimally
changed in over 2 years. 2 mm nodule in the right upper lobe on
sequence 5 image 67 is minimally changed since 1818. There are new
small irregular branching densities in the right upper lobe on
sequence 5 images 65 and 64 and largest component measures roughly 6
mm. Findings are nonspecific but suggestive for infectious or
inflammatory process. There appears to be chronic mucous plugging
involving medial airways in the right lower lobe on sequence 5 image
97 and this mucous plugging has been present since 1818. Few chronic
peripheral opacities in the right lower lobe are suggestive for post
inflammatory changes. Stable small pleural-based nodular densities
in both lungs. No pleural effusions.

Upper Abdomen: Cholecystectomy. Mild fullness of the renal pelvis
bilaterally and minimally changed since 4144. No acute abnormality
in the upper abdomen. Mild fullness in the extrahepatic biliary
system appears stable and likely secondary to the cholecystectomy.

Musculoskeletal: Again noted is bone cement involving the L2
vertebral body compatible with previous augmentation procedure.
Chronic prominent Schmorl's node along the superior endplate of T12.
No acute bone abnormality.
IMPRESSION: 1. Lingula nodule is stable measuring up to 1.5 cm and minimally
changed since 7111. This nodule has slightly enlarged since 3447.
Based on the stability of this nodule, this is likely a benign
entity.
2. Small focus of new branching and nodular densities in the right
upper lobe are suggestive for mucoid impaction and likely represent
an infectious or inflammatory process.
3. Aortic Atherosclerosis ([id number]). Coronary artery
calcifications.

## 2023-04-12 ENCOUNTER — Other Ambulatory Visit: Payer: Self-pay | Admitting: Physician Assistant

## 2023-04-21 ENCOUNTER — Other Ambulatory Visit (HOSPITAL_COMMUNITY): Payer: Self-pay | Admitting: *Deleted

## 2023-04-22 ENCOUNTER — Other Ambulatory Visit (HOSPITAL_COMMUNITY): Payer: Self-pay | Admitting: *Deleted

## 2023-04-22 ENCOUNTER — Inpatient Hospital Stay (HOSPITAL_COMMUNITY): Admission: RE | Admit: 2023-04-22 | Payer: Medicare HMO | Source: Ambulatory Visit

## 2023-04-25 ENCOUNTER — Encounter (HOSPITAL_COMMUNITY)
Admission: RE | Admit: 2023-04-25 | Discharge: 2023-04-25 | Disposition: A | Discharge status: Home / Self Care | Payer: Medicare HMO | Source: Ambulatory Visit | Attending: Internal Medicine | Admitting: Internal Medicine

## 2023-04-25 DIAGNOSIS — M810 Age-related osteoporosis without current pathological fracture: Secondary | ICD-10-CM | POA: Insufficient documentation

## 2023-04-25 MED ORDER — DENOSUMAB 60 MG/ML ~~LOC~~ SOSY
60.0000 mg | PREFILLED_SYRINGE | Freq: Once | SUBCUTANEOUS | Status: AC
  Administered 2023-04-25: 60 mg via SUBCUTANEOUS

## 2023-04-25 MED ORDER — DENOSUMAB 60 MG/ML ~~LOC~~ SOSY
PREFILLED_SYRINGE | SUBCUTANEOUS | Status: AC
  Filled 2023-04-25: qty 1

## 2023-05-05 ENCOUNTER — Telehealth: Payer: Self-pay | Admitting: Internal Medicine

## 2023-05-05 NOTE — Telephone Encounter (Signed)
Inbound call from patient stating she has been having severe burning sensation in her upper abdomen. Patient states she is also having a very hard time swallowing. Patient requesting a call back. Please advise, thank you.

## 2023-05-05 NOTE — Telephone Encounter (Signed)
Pt states she has been having a lot of epigastric pain even though she is taking protonix 40mg  bid along with pepcid bid. Pt also reports trouble swallowing. Pt scheduled to see Hyacinth Meeker PA 05/12/23 at 1:30pm. Pt aware of appt.

## 2023-05-08 DIAGNOSIS — R1084 Generalized abdominal pain: Secondary | ICD-10-CM | POA: Diagnosis not present

## 2023-05-08 DIAGNOSIS — K2970 Gastritis, unspecified, without bleeding: Secondary | ICD-10-CM | POA: Diagnosis not present

## 2023-05-08 DIAGNOSIS — E278 Other specified disorders of adrenal gland: Secondary | ICD-10-CM | POA: Diagnosis not present

## 2023-05-08 DIAGNOSIS — K5730 Diverticulosis of large intestine without perforation or abscess without bleeding: Secondary | ICD-10-CM | POA: Diagnosis not present

## 2023-05-08 DIAGNOSIS — R519 Headache, unspecified: Secondary | ICD-10-CM | POA: Diagnosis not present

## 2023-05-08 DIAGNOSIS — R911 Solitary pulmonary nodule: Secondary | ICD-10-CM | POA: Diagnosis not present

## 2023-05-08 DIAGNOSIS — R1013 Epigastric pain: Secondary | ICD-10-CM | POA: Diagnosis not present

## 2023-05-08 DIAGNOSIS — K2900 Acute gastritis without bleeding: Secondary | ICD-10-CM | POA: Diagnosis not present

## 2023-05-08 DIAGNOSIS — I10 Essential (primary) hypertension: Secondary | ICD-10-CM | POA: Diagnosis not present

## 2023-05-08 DIAGNOSIS — K449 Diaphragmatic hernia without obstruction or gangrene: Secondary | ICD-10-CM | POA: Diagnosis not present

## 2023-05-08 DIAGNOSIS — K838 Other specified diseases of biliary tract: Secondary | ICD-10-CM | POA: Diagnosis not present

## 2023-05-08 DIAGNOSIS — G9389 Other specified disorders of brain: Secondary | ICD-10-CM | POA: Diagnosis not present

## 2023-05-08 DIAGNOSIS — R404 Transient alteration of awareness: Secondary | ICD-10-CM | POA: Diagnosis not present

## 2023-05-08 DIAGNOSIS — K311 Adult hypertrophic pyloric stenosis: Secondary | ICD-10-CM | POA: Diagnosis not present

## 2023-05-08 DIAGNOSIS — E871 Hypo-osmolality and hyponatremia: Secondary | ICD-10-CM | POA: Diagnosis not present

## 2023-05-08 DIAGNOSIS — M7981 Nontraumatic hematoma of soft tissue: Secondary | ICD-10-CM | POA: Diagnosis not present

## 2023-05-08 DIAGNOSIS — K3189 Other diseases of stomach and duodenum: Secondary | ICD-10-CM | POA: Diagnosis not present

## 2023-05-08 DIAGNOSIS — G4489 Other headache syndrome: Secondary | ICD-10-CM | POA: Diagnosis not present

## 2023-05-12 ENCOUNTER — Ambulatory Visit: Payer: Medicare HMO | Admitting: Physician Assistant

## 2023-05-12 DIAGNOSIS — R911 Solitary pulmonary nodule: Secondary | ICD-10-CM | POA: Diagnosis not present

## 2023-05-12 DIAGNOSIS — K5909 Other constipation: Secondary | ICD-10-CM | POA: Diagnosis not present

## 2023-05-12 DIAGNOSIS — I10 Essential (primary) hypertension: Secondary | ICD-10-CM | POA: Diagnosis not present

## 2023-05-12 DIAGNOSIS — K2900 Acute gastritis without bleeding: Secondary | ICD-10-CM | POA: Diagnosis not present

## 2023-05-12 DIAGNOSIS — E871 Hypo-osmolality and hyponatremia: Secondary | ICD-10-CM | POA: Diagnosis not present

## 2023-06-08 ENCOUNTER — Other Ambulatory Visit: Payer: Self-pay | Admitting: Cardiovascular Disease

## 2023-06-17 DIAGNOSIS — I700 Atherosclerosis of aorta: Secondary | ICD-10-CM | POA: Diagnosis not present

## 2023-06-17 DIAGNOSIS — N184 Chronic kidney disease, stage 4 (severe): Secondary | ICD-10-CM | POA: Diagnosis not present

## 2023-06-17 DIAGNOSIS — M8580 Other specified disorders of bone density and structure, unspecified site: Secondary | ICD-10-CM | POA: Diagnosis not present

## 2023-06-17 DIAGNOSIS — R82998 Other abnormal findings in urine: Secondary | ICD-10-CM | POA: Diagnosis not present

## 2023-06-17 DIAGNOSIS — I129 Hypertensive chronic kidney disease with stage 1 through stage 4 chronic kidney disease, or unspecified chronic kidney disease: Secondary | ICD-10-CM | POA: Diagnosis not present

## 2023-06-17 DIAGNOSIS — K219 Gastro-esophageal reflux disease without esophagitis: Secondary | ICD-10-CM | POA: Diagnosis not present

## 2023-06-17 DIAGNOSIS — E785 Hyperlipidemia, unspecified: Secondary | ICD-10-CM | POA: Diagnosis not present

## 2023-06-17 DIAGNOSIS — E1129 Type 2 diabetes mellitus with other diabetic kidney complication: Secondary | ICD-10-CM | POA: Diagnosis not present

## 2023-06-17 DIAGNOSIS — D692 Other nonthrombocytopenic purpura: Secondary | ICD-10-CM | POA: Diagnosis not present

## 2023-07-22 DIAGNOSIS — M810 Age-related osteoporosis without current pathological fracture: Secondary | ICD-10-CM | POA: Diagnosis not present

## 2023-07-22 DIAGNOSIS — Z1389 Encounter for screening for other disorder: Secondary | ICD-10-CM | POA: Diagnosis not present

## 2023-07-22 DIAGNOSIS — N184 Chronic kidney disease, stage 4 (severe): Secondary | ICD-10-CM | POA: Diagnosis not present

## 2023-07-22 DIAGNOSIS — I10 Essential (primary) hypertension: Secondary | ICD-10-CM | POA: Diagnosis not present

## 2023-07-22 DIAGNOSIS — I129 Hypertensive chronic kidney disease with stage 1 through stage 4 chronic kidney disease, or unspecified chronic kidney disease: Secondary | ICD-10-CM | POA: Diagnosis not present

## 2023-07-22 DIAGNOSIS — M549 Dorsalgia, unspecified: Secondary | ICD-10-CM | POA: Diagnosis not present

## 2023-07-22 DIAGNOSIS — Z0000 Encounter for general adult medical examination without abnormal findings: Secondary | ICD-10-CM | POA: Diagnosis not present

## 2023-07-22 DIAGNOSIS — E039 Hypothyroidism, unspecified: Secondary | ICD-10-CM | POA: Diagnosis not present

## 2023-07-22 DIAGNOSIS — E1129 Type 2 diabetes mellitus with other diabetic kidney complication: Secondary | ICD-10-CM | POA: Diagnosis not present

## 2023-08-29 DIAGNOSIS — Z1231 Encounter for screening mammogram for malignant neoplasm of breast: Secondary | ICD-10-CM | POA: Diagnosis not present

## 2023-08-29 DIAGNOSIS — R92323 Mammographic fibroglandular density, bilateral breasts: Secondary | ICD-10-CM | POA: Diagnosis not present

## 2023-09-21 DIAGNOSIS — L720 Epidermal cyst: Secondary | ICD-10-CM | POA: Diagnosis not present

## 2023-09-21 DIAGNOSIS — L578 Other skin changes due to chronic exposure to nonionizing radiation: Secondary | ICD-10-CM | POA: Diagnosis not present

## 2023-10-11 ENCOUNTER — Ambulatory Visit: Payer: Medicare HMO | Admitting: Internal Medicine

## 2023-10-11 ENCOUNTER — Encounter: Payer: Self-pay | Admitting: Internal Medicine

## 2023-10-11 VITALS — BP 130/80 | HR 72 | Ht 60.0 in | Wt 123.0 lb

## 2023-10-11 DIAGNOSIS — E538 Deficiency of other specified B group vitamins: Secondary | ICD-10-CM | POA: Diagnosis not present

## 2023-10-11 DIAGNOSIS — M8580 Other specified disorders of bone density and structure, unspecified site: Secondary | ICD-10-CM | POA: Diagnosis not present

## 2023-10-11 DIAGNOSIS — E785 Hyperlipidemia, unspecified: Secondary | ICD-10-CM | POA: Diagnosis not present

## 2023-10-11 DIAGNOSIS — K219 Gastro-esophageal reflux disease without esophagitis: Secondary | ICD-10-CM | POA: Diagnosis not present

## 2023-10-11 DIAGNOSIS — R1319 Other dysphagia: Secondary | ICD-10-CM

## 2023-10-11 DIAGNOSIS — D649 Anemia, unspecified: Secondary | ICD-10-CM | POA: Diagnosis not present

## 2023-10-11 DIAGNOSIS — R1310 Dysphagia, unspecified: Secondary | ICD-10-CM | POA: Diagnosis not present

## 2023-10-11 DIAGNOSIS — F649 Gender identity disorder, unspecified: Secondary | ICD-10-CM | POA: Diagnosis not present

## 2023-10-11 DIAGNOSIS — I129 Hypertensive chronic kidney disease with stage 1 through stage 4 chronic kidney disease, or unspecified chronic kidney disease: Secondary | ICD-10-CM | POA: Diagnosis not present

## 2023-10-11 DIAGNOSIS — N184 Chronic kidney disease, stage 4 (severe): Secondary | ICD-10-CM | POA: Diagnosis not present

## 2023-10-11 DIAGNOSIS — E1129 Type 2 diabetes mellitus with other diabetic kidney complication: Secondary | ICD-10-CM | POA: Diagnosis not present

## 2023-10-11 DIAGNOSIS — E039 Hypothyroidism, unspecified: Secondary | ICD-10-CM | POA: Diagnosis not present

## 2023-10-11 NOTE — Progress Notes (Signed)
 HISTORY OF PRESENT ILLNESS:  Jessica [name] is a 73 y.o. 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 Patient has had problems with intermittent solid food dysphagia for many years. 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 She previously denied any significant problems with liquids or pills, but now mentions that she can have some difficulties with liquids such as water. She did undergo upper endoscopy with esophageal dilation of a distal stricture via balloon November 2022.  They felt that this may have helped for a few months. She then returned to our office May 26, 2021 regarding ongoing problems with dysphagia for which she was seen by our GI physician assistant. Upper endoscopy was scheduled and performed August 02, 2022. Of note, the esophagus was tortuous but otherwise normal. There was no cricopharyngeal resistance. There was a benign-appearing stenosis at 31 cm from the incisors which was dilated up to 20 mm via balloon. She did have a hiatal hernia which was simple. No other abnormalities. She contacted the office thereafter with ongoing complaints of dysphagia and was last seen October 21, 2022 (see that dictation).  She subsequently underwent a barium esophagram with tablet November 09, 2022.  No significant abnormalities noted.  A 13 mm barium tablet passed without difficulty.  She presents today with her husband.  Same complaints of dysphagia.  REVIEW OF SYSTEMS:  All non-GI ROS noncontributory Past Medical History:  Diagnosis Date   Anemia    Anxiety    CKD (chronic kidney disease)    CKD III per PCP notes-pt denies   Congenital blindness    Diabetes mellitus    Diverticula of small intestine    Esophageal stricture    GERD (gastroesophageal reflux disease)    H/O hiatal hernia     Hyperlipidemia    Hypertension    Hypothyroidism    Iron deficiency anemia    Osteopenia    Osteoporosis    Postoperative nausea and vomiting    Retinitis    STEMI (ST elevation myocardial infarction) (HCC)    SVT (supraventricular tachycardia) (HCC)    Vitamin B 12 deficiency     Past Surgical History:  Procedure Laterality Date   BACK SURGERY     CARPAL TUNNEL RELEASE     Right    CESAREAN SECTION     CHOLECYSTECTOMY  07/27/1983   COLONOSCOPY     CORONARY BALLOON ANGIOPLASTY N/A 12/30/2019   Procedure: CORONARY BALLOON ANGIOPLASTY;  Surgeon: Lennette Bihari, MD;  Location: [facility];  Service: Cardiovascular;  Laterality: N/A;   CORONARY/GRAFT ACUTE MI REVASCULARIZATION N/A 12/30/2019   Procedure: Coronary/Graft Acute MI Revascularization;  Surgeon: Lennette Bihari, MD;  Location: [facility];  Service: Cardiovascular;  Laterality: N/A;   HERNIA REPAIR     pt denies   LEFT HEART CATH AND CORONARY ANGIOGRAPHY N/A 12/30/2019   Procedure: LEFT HEART CATH AND CORONARY ANGIOGRAPHY;  Surgeon: Lennette Bihari, MD;  Location: [facility];  Service: Cardiovascular;  Laterality: N/A;   Right knee arthroscopy  07/27/1991   Right thyroidectomy  07/26/2005   TOTAL ABDOMINAL HYSTERECTOMY W/ BILATERAL SALPINGOOPHORECTOMY  07/27/1991   UPPER GASTROINTESTINAL ENDOSCOPY      Social History Jessica [name]  reports that she has never smoked. She has never used smokeless tobacco.  She reports that she does not drink alcohol and does not use drugs.  family history includes COPD in her father; CVA in her mother; Coronary artery disease in her father; Diabetes Mellitus II in her maternal grandmother and mother; Heart disease in her mother; Hyperlipidemia in her sister; Hypertension in her father, mother, sister, and sister; Uterine cancer in her sister.  Allergies  Allergen Reactions   Codeine Shortness Of Breath and Nausea And Vomiting   Erythromycin Nausea And Vomiting    Levaquin [Levofloxacin] Shortness Of Breath   Motrin [Ibuprofen] Other (See Comments)    Pt told to avoid ibuprofen due to her heart.   Ultram [Tramadol] Nausea And Vomiting       PHYSICAL EXAMINATION: Vital signs: BP 130/80   Pulse 72   Ht 5' (1.524 m)   Wt 123 lb (55.8 kg)   BMI 24.02 kg/m   Constitutional: Thin and chronically ill-appearing, no acute distress Psychiatric: alert and oriented x3, cooperative Eyes: Blind Mouth: No throat rash Neck: supple no lymphadenopathy Cardiovascular: heart regular rate and rhythm, no murmur Lungs: clear to auscultation bilaterally Abdomen: soft, nontender, nondistended, no obvious ascites, no peritoneal signs, normal bowel sounds, no organomegaly Rectal: Omitted Extremities: no clubbing, cyanosis, or lower extremity edema bilaterally Skin: no lesions on visible extremities Neuro: No focal deficits.  Cranial nerves intact  ASSESSMENT:  1.  Chronic dysphagia.  No response to upper endoscopy with esophageal dilation on 2 occasions.  Negative esophagram with tablet.  Question dysmotility 2.  GERD.  On pantoprazole and famotidine 3.  Multiple colonoscopies negative or with diminutive adenoma.  Last such exam 2020.  Aged out of surveillance  PLAN:  1.  Esophageal manometry to rule out significant motility disorder.  Further recommendations after the above 2.  Continue pantoprazole and famotidine 3.  Ongoing general medical care with Dr. [name] A total time of 30 minutes was spent preparing to see the patient, obtaining comprehensive interval history, performing medically appropriate physical exam, counseling and educating the patient regarding the above listed issues, ordering esophageal manometry, and documenting clinical information in the health

## 2023-10-11 NOTE — Patient Instructions (Signed)
 You have been scheduled for an esophageal manometry test at [facility] on 03/07/2024 at 10:30am. Please arrive 30 minutes prior to your procedure for registration. You will need to go to outpatient registration (1st floor of the hospital) first. Make certain to bring your insurance cards as well as a complete list of medications.  Please remember the following:  1) Do not take any muscle relaxants, xanax (alprazolam) or ativan for 1 day prior to your test as well as the day of the test.  2) Nothing to eat or drink after 12:00 midnight on the night before your test.  3) Hold all diabetic medications/insulin the morning of the test. You may eat and take your medications after the test.  It will take at least 2 weeks to receive the results of this test from your physician.  ------------------------------------------ ABOUT ESOPHAGEAL MANOMETRY Esophageal manometry (muh-NOM-uh-tree) is a test that gauges how well your esophagus works. Your esophagus is the long, muscular tube that connects your throat to your stomach. Esophageal manometry measures the rhythmic muscle contractions (peristalsis) that occur in your esophagus when you swallow. Esophageal manometry also measures the coordination and force exerted by the muscles of your esophagus.  During esophageal manometry, a thin, flexible tube (catheter) that contains sensors is passed through your nose, down your esophagus and into your stomach. Esophageal manometry can be helpful in diagnosing some mostly uncommon disorders that affect your esophagus.  Why it's done Esophageal manometry is used to evaluate the movement (motility) of food through the esophagus and into the stomach. The test measures how well the circular bands of muscle (sphincters) at the top and bottom of your esophagus open and close, as well as the pressure, strength and pattern of the wave of esophageal muscle contractions that moves food along.  What you can  expect Esophageal manometry is an outpatient procedure done without sedation. Most people tolerate it well. You may be asked to change into a hospital gown before the test starts.  During esophageal manometry  While you are sitting up, a member of your health care team sprays your throat with a numbing medication or puts numbing gel in your nose or both.  A catheter is guided through your nose into your esophagus. The catheter may be sheathed in a water-filled sleeve. It doesn't interfere with your breathing. However, your eyes may water, and you may gag. You may have a slight nosebleed from irritation.  After the catheter is in place, you may be asked to lie on your back on an exam table, or you may be asked to remain seated.  You then swallow small sips of water. As you do, a computer connected to the catheter records the pressure, strength and pattern of your esophageal muscle contractions.  During the test, you'll be asked to breathe slowly and smoothly, remain as still as possible, and swallow only when you're asked to do so.  A member of your health care team may move the catheter down into your stomach while the catheter continues its measurements.  The catheter then is slowly withdrawn. The test usually lasts 20 to 30 minutes.  After esophageal manometry  When your esophageal manometry is complete, you may return to your normal activities  This test typically takes 30-45 minutes to complete. ________________________________________________________________________________

## 2023-10-20 DIAGNOSIS — Z1339 Encounter for screening examination for other mental health and behavioral disorders: Secondary | ICD-10-CM | POA: Diagnosis not present

## 2023-10-20 DIAGNOSIS — Z0000 Encounter for general adult medical examination without abnormal findings: Secondary | ICD-10-CM | POA: Diagnosis not present

## 2023-10-20 DIAGNOSIS — R7989 Other specified abnormal findings of blood chemistry: Secondary | ICD-10-CM | POA: Diagnosis not present

## 2023-10-20 DIAGNOSIS — R1319 Other dysphagia: Secondary | ICD-10-CM | POA: Diagnosis not present

## 2023-10-20 DIAGNOSIS — H543 Unqualified visual loss, both eyes: Secondary | ICD-10-CM | POA: Diagnosis not present

## 2023-10-20 DIAGNOSIS — Z1331 Encounter for screening for depression: Secondary | ICD-10-CM | POA: Diagnosis not present

## 2023-10-20 DIAGNOSIS — R82998 Other abnormal findings in urine: Secondary | ICD-10-CM | POA: Diagnosis not present

## 2023-10-20 DIAGNOSIS — E538 Deficiency of other specified B group vitamins: Secondary | ICD-10-CM | POA: Diagnosis not present

## 2023-10-20 DIAGNOSIS — I129 Hypertensive chronic kidney disease with stage 1 through stage 4 chronic kidney disease, or unspecified chronic kidney disease: Secondary | ICD-10-CM | POA: Diagnosis not present

## 2023-10-20 DIAGNOSIS — E1129 Type 2 diabetes mellitus with other diabetic kidney complication: Secondary | ICD-10-CM | POA: Diagnosis not present

## 2023-10-20 DIAGNOSIS — E039 Hypothyroidism, unspecified: Secondary | ICD-10-CM | POA: Diagnosis not present

## 2023-10-20 DIAGNOSIS — I700 Atherosclerosis of aorta: Secondary | ICD-10-CM | POA: Diagnosis not present

## 2023-10-20 DIAGNOSIS — N184 Chronic kidney disease, stage 4 (severe): Secondary | ICD-10-CM | POA: Diagnosis not present

## 2023-10-27 ENCOUNTER — Encounter: Payer: Self-pay | Admitting: Cardiovascular Disease

## 2023-10-27 ENCOUNTER — Ambulatory Visit: Payer: Medicare HMO | Attending: Cardiovascular Disease | Admitting: Cardiovascular Disease

## 2023-10-27 DIAGNOSIS — Z9861 Coronary angioplasty status: Secondary | ICD-10-CM | POA: Diagnosis not present

## 2023-10-27 DIAGNOSIS — I952 Hypotension due to drugs: Secondary | ICD-10-CM | POA: Diagnosis not present

## 2023-10-27 DIAGNOSIS — E118 Type 2 diabetes mellitus with unspecified complications: Secondary | ICD-10-CM | POA: Diagnosis not present

## 2023-10-27 DIAGNOSIS — E039 Hypothyroidism, unspecified: Secondary | ICD-10-CM | POA: Diagnosis not present

## 2023-10-27 DIAGNOSIS — E785 Hyperlipidemia, unspecified: Secondary | ICD-10-CM

## 2023-10-27 DIAGNOSIS — I10 Essential (primary) hypertension: Secondary | ICD-10-CM | POA: Diagnosis not present

## 2023-10-27 DIAGNOSIS — I2510 Atherosclerotic heart disease of native coronary artery without angina pectoris: Secondary | ICD-10-CM | POA: Diagnosis not present

## 2023-10-27 MED ORDER — AMLODIPINE BESYLATE 2.5 MG PO TABS: 2.5000 mg | ORAL_TABLET | Freq: Every day | ORAL | 3 refills | Status: AC

## 2023-10-27 NOTE — Progress Notes (Signed)
 Cardiology Office Note    Date:  10/31/2023   ID:  Jessica [name], Jessica [name] 1951-06-20, MRN [id number]  PCP:  Cleatis Polka., MD  Cardiologist:  Nicki Guadalajara, MD   12 -month F/U office visit  History of Present Illness:  Jessica [name] is a 73 y.o. female who suffered an acute coronary syndrome on December 30, 2019.  I last saw her on October 15, 2022.  She presents for 1 year follow-up evaluation.    Jessica [name]  has a history of hypertension and hyperlipidemia.  In early June 2021 she began to experience episodic shortness of breath.  On the morning of December 30, 2019 she experienced significant substernal chest pressure and tightness with radiation to her back.  She presented to [facility] emergency room where her ECG initially showed early inferior ST elevation.  A code STEMI was activated.  She was taken emergently to the catheterization laboratory by me and was found to have mild nonobstructive stenosis of 30% in the proximal LAD in the region of the first diagonal septal perforating artery.  She had a normal ramus intermediate and left circumflex vessel.  She had a dominant RCA with total mid distal occlusion of the PDA vessel.  Her vessel caliber was small and she underwent successful balloon angioplasty with restoration of TIMI-3 flow.  When evaluated by [name] in July 2021 she continued to be on antiplatelet therapy with aspirin and Brilinta.  An initial echo Doppler study had shown an EF of 55 to 60% with grade 1 diastolic dysfunction with wall motion abnormality involving the inferior wall apical segment inferoseptal segment.  She was readmitted to the hospital on January 13, 2020 with associated chest and epigastric pain with radiation to her upper back and shoulder blades.  Amlodipine and Imdur were added to her medical regimen.  She subsequently developed hypotension and amlodipine dose was decreased to 5 mg and carvedilol to 6.25 mg twice a day.  I saw her in October 2021 for my initial  in office evaluation.  At that time he denied any recurrent chest pain off and felt like she had a yawn to take a deep breath.   She was on aspirin and ticagrelor for antiplatelet therapy.  She waas on isosorbide 15 mg and carvedilol 6.25 mg twice a day; however was only taking carvedilol on a daily basis. She is diabetic on Jardiance 10 mg and has hypothyroidism on levothyroxine 75 mcg. On rosuvastatin 20 mg LDL cholesterol was 47.  During my initial evaluation, since she was having difficulty taking the carvedilol twice a day I changed her to metoprolol succinate 12.5 mg daily.  She had been on levothyroxine for hypothyroidism and laboratory by her primary physician in June 2021 revealed her to be over suppressed.  She sees Dr. [name] for her primary care.  She was evaluated in a telemedicine visit by [name], PA-C in November.  This evaluation was prompted by her prior emergency room visit where she presented with syncope.  Apparently, the patient was walking through her kitchen at a fast pace and accidentally struck her abdomen against the corner of the countertop.  He is legally blind.  An abdominal/pelvic CT showed a large abdominal wall hematoma secondary to her blunt trauma.  She required several units of packed red blood cells.  Aspirin and Brilinta were held.  Ultimately it was felt that she could discontinue Brilinta she had previously undergone balloon angioplasty not  stenting due to her all distal vessel.  When valuated by [name], her hemoglobin had increased.  Time she had noted some mild lightheadedness and dizziness.  I saw her on September 11, 2020.  She had Covid in December 20221 and underwent antibody infusion therapy.  Her symptoms included weakness, fatigue, cough and fever.  At her evaluation she admitted at times to the sensation of her heart racing.  She experienced a slight lump in her abdomen at the site of her recent blunt trauma from hitting the corner of her  countertop.  At the time of injury, she had developed a significant abdominal wall hematoma.  Her hemoglobin has been stable and she states most recently it was 13.2.  She denied recurrent anginal symptoms.  Lipid studies were excellent with an LDL at 29 in January 2022.  I saw her on October 05, 2021 at which time she felt well and was not experiencing any chest pain.She is now seeing Dr. [name] of nephrology.  She is followed by Dr. [name] for primary care.  She has been without any chest pain or awareness of palpitations.  She has continued to be on isosorbide which is 15 mg, metoprolol succinate 50 mg.  She is diabetic on Jardiance in addition to Trulicity.  She is tolerating rosuvastatin 40 mg and fenofibrate for lipids and she remains on levothyroxine for hypothyroidism.  Most recent lipid studies in January 2023 showed an LDL cholesterol at 41 although triglycerides remain elevated at 224.   She was hospitalized from August 14 through March 10, 2022 with palpitations.  Her blood pressure had been elevated.  Chemistry was notable for creatinine 1.77 and slight elevation of transaminases.  Fenofibrate was discontinued during that hospitalization.  I last saw her on October 15, 2022.  At that time she was feeling well.  She is legally blind.  She had worn an event monitor from August 25 through April 17, 2022.  She was in sinus rhythm with as well as heart rate at 61 bpm and the fastest was sinus tachycardia.  There were very rare isolated PACs and PVCs.  There was no high-grade ectopy.  There were no episodes of atrial fibrillation, SVT, or pauses.  She was evaluated by [name], NP in January 2024 at that time she had run out of her Jardiance for approximately 3 to 4 weeks.  She had recently fallen and had developed a significant left buttock hematoma leading to an ER evaluation September 28, 2022 with discharge September 29, 2022.  She had experienced 2 falls.  The first fall seem to be mechanical after  tripping.  There was concern of possible syncope for the second fall.  There was concern that she may have been dehydrated from the recent urinary infection.  She was mildly hyponatremic with mild hyperkalemia.  There was a low suspicion for infection or seizure activity.  She underwent an echo Doppler study on September 28, 2022 which showed an EF of 60 to 65% with normal diastolic parameters and no wall motion abnormalities.  Valves were essentially normal.  During that evaluation, her blood pressure was low and rhythm was stable.  I suggested she decrease her metoprolol succinate from 50 mg down to 37.5 mg.  No she has a prescription for hydralazine she had not been taking this for some time.  She continued to be on rosuvastatin 40 mg for hyperlipidemia and levothyroxine for hypothyroidism and was back on Jardiance and Trulicity for diabetes mellitus.  Since  I last saw her, she was evaluated on February 08, 2023 by Bernadene Person, NP.  At that time, she denied any recent palpitations on her current dose of metoprolol.  Presently, Jessica [name] is in the office today with her husband.  She is scheduled to undergo manometry of her esophagus by Dr. Yancey Flemings.  She continues to see Dr. [name] at [facility] for primary care.  She states her blood pressure has been very stable for the past 4 to 6 months but then at other times it may transiently increase.  However most of the time she believes her blood pressure has been stable in the mid 120s systolically.  She denies chest tightness or pressure.  She is unaware of recent palpitations.  She continues to be on metoprolol succinate 37.5 mg, levothyroxine 75 mcg, rosuvastatin 40 mg, Protonix 40 mg, and takes Trulicity and Jardiance for diabetes mellitus.  She presents for evaluation.   Past Medical History:  Diagnosis Date   Anemia    Anxiety    CKD (chronic kidney disease)    CKD III per PCP notes-pt denies   Congenital blindness    Diabetes mellitus    Diverticula  of small intestine    Esophageal stricture    GERD (gastroesophageal reflux disease)    H/O hiatal hernia    Hyperlipidemia    Hypertension    Hypothyroidism    Iron deficiency anemia    Osteopenia    Osteoporosis    Postoperative nausea and vomiting    Retinitis    STEMI (ST elevation myocardial infarction) (HCC)    SVT (supraventricular tachycardia) (HCC)    Vitamin B 12 deficiency     Past Surgical History:  Procedure Laterality Date   BACK SURGERY     CARPAL TUNNEL RELEASE     Right    CESAREAN SECTION     CHOLECYSTECTOMY  07/27/1983   COLONOSCOPY     CORONARY BALLOON ANGIOPLASTY N/A 12/30/2019   Procedure: CORONARY BALLOON ANGIOPLASTY;  Surgeon: Lennette Bihari, MD;  Location: [facility] INVASIVE CV LAB;  Service: Cardiovascular;  Laterality: N/A;   CORONARY/GRAFT ACUTE MI REVASCULARIZATION N/A 12/30/2019   Procedure: Coronary/Graft Acute MI Revascularization;  Surgeon: Lennette Bihari, MD;  Location: [facility] INVASIVE CV LAB;  Service: Cardiovascular;  Laterality: N/A;   HERNIA REPAIR     pt denies   LEFT HEART CATH AND CORONARY ANGIOGRAPHY N/A 12/30/2019   Procedure: LEFT HEART CATH AND CORONARY ANGIOGRAPHY;  Surgeon: Lennette Bihari, MD;  Location: [facility] INVASIVE CV LAB;  Service: Cardiovascular;  Laterality: N/A;   Right knee arthroscopy  07/27/1991   Right thyroidectomy  07/26/2005   TOTAL ABDOMINAL HYSTERECTOMY W/ BILATERAL SALPINGOOPHORECTOMY  07/27/1991   UPPER GASTROINTESTINAL ENDOSCOPY      Current Medications: Outpatient Medications Prior to Visit  Medication Sig Dispense Refill   aspirin EC 81 MG tablet Take 81 mg by mouth daily. Swallow whole.     Coenzyme Q10 (CO Q 10 PO) Take 1 capsule by mouth daily.     cyanocobalamin (,VITAMIN B-12,) 1000 MCG/ML injection Inject 1,000 mcg into the muscle See admin instructions. 1000 mcg on the 10th of every month, every 30 days.     Dulaglutide (TRULICITY) 1.5 MG/0.5ML SOPN Inject 1.5 mg into the skin every Wednesday.      empagliflozin (JARDIANCE) 10 MG TABS tablet Take 1 tablet (10 mg total) by mouth daily. 30 tablet 6   famotidine (PEPCID) 40 MG tablet TAKE 1 TABLET TWICE DAILY 180  tablet 3   hydrALAZINE (APRESOLINE) 10 MG tablet Take 1 tablet (10 mg total) by mouth 3 (three) times daily as needed. hydralazine 10 mg as needed systolic blood pressure (top number) greater than 160. 120 tablet 3   levothyroxine (SYNTHROID) 50 MCG tablet Take 75 mcg by mouth daily before breakfast.     LORazepam (ATIVAN) 0.5 MG tablet Take 0.5 mg by mouth 2 (two) times daily as needed for anxiety.     magnesium oxide (MAG-OX) 400 MG tablet Take 400 mg by mouth daily.     metoprolol succinate (TOPROL-XL) 25 MG 24 hr tablet Take 1.5 tablets (37.5 mg total) by mouth daily. 135 tablet 3   nitroGLYCERIN (NITROSTAT) 0.4 MG SL tablet Place 1 tablet (0.4 mg total) under the tongue every 5 (five) minutes x 3 doses as needed for chest pain. 25 tablet 3   pantoprazole (PROTONIX) 40 MG tablet Take 40 mg by mouth 2 (two) times daily.     Probiotic Product (PROBIOTIC PO) Take 1 capsule by mouth 2 (two) times daily.     rosuvastatin (CRESTOR) 40 MG tablet TAKE 1 TABLET EVERY DAY 90 tablet 1   sertraline (ZOLOFT) 50 MG tablet Take 50 mg by mouth at bedtime.      acetaminophen (TYLENOL) 325 MG tablet Take 2 tablets (650 mg total) by mouth every 6 (six) hours as needed. (Patient not taking: Reported on 02/08/2023) 240 tablet 0   methocarbamol (ROBAXIN) 500 MG tablet Take 1 tablet (500 mg total) by mouth every 8 (eight) hours as needed for muscle spasms. (Patient not taking: Reported on 10/27/2023) 30 tablet 0   ondansetron (ZOFRAN-ODT) 4 MG disintegrating tablet Take 4 mg by mouth every 6 (six) hours as needed. (Patient not taking: Reported on 10/27/2023)     No facility-administered medications prior to visit.     Allergies:   Codeine, Erythromycin, Levaquin [levofloxacin], Motrin [ibuprofen], and Ultram [tramadol]   Social History   Socioeconomic  History   Marital status: Married    Spouse name: Not on file   Number of children: 4   Years of education: Not on file   Highest education level: Not on file  Occupational History   Occupation: house wife    Occupation: retired    Associate Professor: [employer]  Tobacco Use   Smoking status: Never   Smokeless tobacco: Never  Vaping Use   Vaping status: Never Used  Substance and Sexual Activity   Alcohol use: No   Drug use: No   Sexual activity: Not Currently  Other Topics Concern   Not on file  Social History Narrative   ** Merged History Encounter **       Social Drivers of Health   Financial Resource Strain: Not on file  Food Insecurity: No Food Insecurity (12/20/2022)   Hunger Vital Sign    Worried About Running Out of Food in the Last Year: Never true    Ran Out of Food in the Last Year: Never true  Transportation Needs: No Transportation Needs (12/20/2022)   PRAPARE - Administrator, Civil Service (Medical): No    Lack of Transportation (Non-Medical): No  Physical Activity: Not on file  Stress: Not on file  Social Connections: Unknown (12/06/2021)   [facility]   Social Network    Social Network: Not on file     Family History:  The patient's family history includes COPD in her father; CVA in her mother; Coronary  artery disease in her father; Diabetes Mellitus II in her maternal grandmother and mother; Heart disease in her mother; Hyperlipidemia in her sister; Hypertension in her father, mother, sister, and sister; Uterine cancer in her sister.   ROS General: Negative; No fevers, chills, or night sweats;  HEENT: Legally blind, no changes in hearing, sinus congestion, difficulty swallowing Pulmonary: Negative; No cough, wheezing, shortness of breath, hemoptysis Cardiovascular: See HPI GI: Negative; No nausea, vomiting, diarrhea, or abdominal pain GU: Negative; No dysuria, hematuria, or difficulty  voiding Musculoskeletal: Negative; no myalgias, joint pain, or weakness Hematologic/Oncology: Recent abdominal wall hematoma secondary to blunt trauma while on aspirin/Brilinta. Endocrine: Negative; no heat/cold intolerance; no diabetes Neuro: Negative; no changes in balance, headaches Skin: Negative; No rashes or skin lesions Psychiatric: Negative; No behavioral problems, depression Sleep: Negative; No snoring, daytime sleepiness, hypersomnolence, bruxism, restless legs, hypnogognic hallucinations, no cataplexy Other comprehensive 14 point system review is negative.   PHYSICAL EXAM:   VS:  BP (!) 170/90   Pulse 70   Ht 5\' 5"  (1.651 m)   Wt 121 lb 9.6 oz (55.2 kg)   SpO2 97%   BMI 20.24 kg/m     Repeat blood pressure by me 150/80 and repeat 140/82.  Wt Readings from Last 3 Encounters:  10/27/23 121 lb 9.6 oz (55.2 kg)  10/11/23 123 lb (55.8 kg)  02/08/23 114 lb 9.6 oz (52 kg)    General: Alert, oriented, no distress.  Skin: normal turgor, no rashes, warm and dry HEENT: Normocephalic, atraumatic. Pupils equal round and reactive to light; sclera anicteric; extraocular muscles intact; legally blind Nose without nasal septal hypertrophy Mouth/Parynx benign; Mallinpatti scale 2 Neck: No JVD, no carotid bruits; normal carotid upstroke Lungs: clear to ausculatation and percussion; no wheezing or rales Chest wall: without tenderness to palpitation Heart: PMI not displaced, RRR, s1 s2 normal, 1/6 systolic murmur, no diastolic murmur, no rubs, gallops, thrills, or heaves Abdomen: soft, nontender; no hepatosplenomehaly, BS+; abdominal aorta nontender and not dilated by palpation. Back: no CVA tenderness Pulses 2+ Musculoskeletal: full range of motion, normal strength, no joint deformities Extremities: no clubbing cyanosis or edema, Homan's sign negative  Neurologic: grossly nonfocal; Cranial nerves grossly wnl Psychologic: Normal mood and affect   Studies/Labs Reviewed:   EKG  Interpretation Date/Time:  Thursday October 27 2023 08:44:48 EDT Ventricular Rate:  70 PR Interval:  194 QRS Duration:  78 QT Interval:  394 QTC Calculation: 425 R Axis:   18  Text Interpretation: Normal sinus rhythm Low voltage QRS When compared with ECG of 19-Dec-2022 19:52, PREVIOUS ECG IS PRESENT Confirmed by Nicki Guadalajara ([id number]) on 10/27/2023 8:51:36 AM    October 15, 2022 ECG (independently read by me): NSR at 76, no ectopy  October 05, 2021 ECG (independently read by me): NSR at 64; no ST changes, no ectopr   September 11, 2020 ECG (independently read by me): NSR at 70, no ectopy  October 2021 ECG (independently read by me): NSR at 64, no ST changes, no ectopy, normal intervals  Recent Labs:    Latest Ref Rng & Units 12/22/2022    2:17 AM 12/21/2022   12:48 AM 12/20/2022    7:06 AM  BMP  Glucose 70 - 99 mg/dL 427  062  376   BUN 8 - 23 mg/dL 18  21  23    Creatinine 0.44 - 1.00 mg/dL 2.83  1.51  7.61   Sodium 135 - 145 mmol/L 135  137  134   Potassium 3.5 - 5.1  mmol/L 4.3  4.0  4.8   Chloride 98 - 111 mmol/L 111  113  110   CO2 22 - 32 mmol/L 15  18  16    Calcium 8.9 - 10.3 mg/dL 7.3  6.7  6.7         Latest Ref Rng & Units 12/19/2022    5:33 PM 09/28/2022    6:09 PM 09/28/2022    3:05 AM  Hepatic Function  Total Protein 6.5 - 8.1 g/dL 6.0   6.5   Albumin 3.5 - 5.0 g/dL 3.4  3.2  3.8   AST 15 - 41 U/L 31   34   ALT 0 - 44 U/L 33   33   Alk Phosphatase 38 - 126 U/L 26   28   Total Bilirubin 0.3 - 1.2 mg/dL 0.5   0.5        Latest Ref Rng & Units 12/22/2022    2:17 AM 12/21/2022   12:48 AM 12/20/2022    3:26 PM  CBC  WBC 4.0 - 10.5 K/uL 11.3  10.1  13.8   Hemoglobin 12.0 - 15.0 g/dL 7.6  7.6  9.0   Hematocrit 36.0 - 46.0 % 22.7  22.6  27.2   Platelets 150 - 400 K/uL 170  160  176    Lab Results  Component Value Date   MCV 89.4 12/22/2022   MCV 89.7 12/21/2022   MCV 92.2 12/20/2022   Lab Results  Component Value Date   TSH 6.543 (H) 09/28/2022   Lab Results   Component Value Date   HGBA1C 7.7 (H) 09/28/2022     BNP    Component Value Date/Time   BNP 15.6 09/21/2017 2244    ProBNP No results found for: "PROBNP"   Lipid Panel     Component Value Date/Time   CHOL 116 12/31/2019 0325   TRIG 216 (H) 12/31/2019 0325   HDL 26 (L) 12/31/2019 0325   CHOLHDL 4.5 12/31/2019 0325   VLDL 43 (H) 12/31/2019 0325   LDLCALC 47 12/31/2019 0325     RADIOLOGY: No results found.   Additional studies/ records that were reviewed today include:  Prox LAD lesion is 30% stenosed. 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 Post intervention, there is a 5% residual stenosis.   Mild nonobstructive stenosis of 30% in the proximal LAD in the region of the first diagonal and septal perforating artery.   Normal ramus intermediate and left circumflex coronary arteries.   Dominant RCA with total mid distal occlusion of the PDA vessel.   LVEDP 22 mmHg.   RECOMMENDATION: DAPT initially but since no stenting was performed duration may be less than 1 year.  Will DC bivalirudin several hours post procedure.  Hydrate.  Aggressive lipid-lowering therapy with target LDL less than 70 and optimal blood pressure control.    Intervention      ECHO: 09/28/2022  1. Left ventricular ejection fraction, by estimation, is 60 to 65%. The  left ventricle has normal function. The left ventricle has no regional  wall motion abnormalities. Left ventricular diastolic parameters were  normal.   2. Right ventricular systolic function is normal. The right ventricular  size is normal.   3. The mitral valve is normal in structure. No evidence of mitral valve  regurgitation. No evidence of mitral stenosis.   4. The aortic valve is normal in structure. Aortic valve regurgitation is  not visualized. No aortic stenosis is present.   5. The inferior vena cava is normal in  size with greater than 50%  respiratory variability, suggesting right atrial pressure of 3 mmHg.    ASSESSMENT:     1. Coronary artery disease involving native coronary artery of native heart without angina pectoris   2. History of percutaneous coronary intervention: December 30, 2019   3. Essential hypertension   4. Hyperlipidemia LDL goal <55   5. Hypothyroidism, unspecified type   6. Type 2 diabetes mellitus with complication, without long-term current use of insulin [facility])     PLAN:  Jessica [name] is a 73 year-old female who developed an acute coronary syndrome on December 30, 2019 and was found to have total mid distal occlusion of the PDA branch of her right coronary artery.  Due to the vessel caliber, stenting was not done but she underwent successful PTCA.  When I saw her for my initial evaluation with me in October 2021 she was continuing to feel well on a regimen consisting of  isosorbide at 15 mg in addition to carvedilol which is supposed to be 6.25 mg twice a day but apparently she has only been taking often as a daily dose.  During that evaluation I recommended changing carvedilol to metoprolol succinate and started her at low-dose of 12.5 mg.  She was doing well on aspirin/Brilinta until she had hard blunt trauma to her lower left abdominal wall when she hit a countertop.  This resulted in development of a significant abdominal wall hematoma and she required 2 units of packed red blood cell transfusion.  When seen by me in February 2022 her hemoglobin was stable and recommended she stay off Brilinta since she had not been stented.  Over the past year, she has done well and denies any anginal symptomatology.  Her blood pressure on repeat by me today was 124/78 which was stable on her regimen consisting of metoprolol succinate 50 mg, isosorbide 15 mg daily.  She has CKD and creatinine in January 2023 was 1.86.  She is followed by Dr. [name] of nephrology.  Lipid studies in January 2023 were excellent with an LDL cholesterol at 41, however triglycerides remain slightly elevated at 224 and she has low HDL  cholesterol at 33.  She had been on fenofibrate, but this had been discontinued after an ER evaluation.  She had fallen leading to ER evaluation on September 28, 2022 where she was found to have significant left buttock hematoma..  An echo Doppler study showed normal LV function with EF 60 to 65% with normal diastolic parameters and no wall motion abnormalities.  Valves were normal.  A Zio monitor did not show any evidence of SVT PAF or pauses.  With her low blood pressure I am suggesting deep creasing metoprolol succinate from 50 mg down to 37.5 mg daily.  Although she has a prescription for hydralazine she has not taken this in some time.  She continues to be on rosuvastatin 40 mg for hyperlipidemia.  She is on levothyroxine 50 mcg for hypothyroidism.  She is now back on Jardiance and Trulicity for her diabetes mellitus.  Her blood pressure today was elevated upon arrival.  She states often times her blood pressure may be in the perfectly normal range for 4 to 6 weeks and then it will increase.  On repeat by me today blood pressure was 150/80 and then again 140.  82.  She has continued to be on metoprolol succinate 37.5 mg daily.  She has not been taking any hydralazine.  With her blood pressure elevation, I  have suggested the addition of low-dose amlodipine that she take 2.5 mg at nighttime prior to bed.  With her CAD, I also have recommended a baby aspirin 81 mg since it does not appear that she had gastroenteritis recently.  She continues to be on levothyroxine 75 mcg for hypothyroidism and is on Jardiance and Trulicity for her diabetes.  She is followed by Dr. Yancey Flemings for GI and is scheduled to go undergo esophageal manometry in August 2025.  I discussed with her my upcoming retirement over the next several months.  I will transition her to the care of Dr. Jodelle Red at our [place] office I will schedule follow-up in approximately 6 months or sooner as needed.    Medication Adjustments/Labs and  Tests Ordered: Current medicines are reviewed at length with the patient today.  Concerns regarding medicines are outlined above.  Medication changes, Labs and Tests ordered today are listed in the Patient Instructions below. Patient Instructions  Medication Instructions:  Begin Amlodipine 2.5mg  nightly at bedtime.  *If you need a refill on your cardiac medications before your next appointment, please call your pharmacy*   Lab Work: No labs were ordered during today's visit.  If you have labs (blood work) drawn today and your tests are completely normal, you will receive your results only by: MyChart Message (if you have MyChart) OR A paper copy in the mail If you have any lab test that is abnormal or we need to change your treatment, we will call you to review the results.   Testing/Procedures: No procedures were ordered during today's visit.    Follow-Up: At [facility] you and your health needs are our priority.  As part of our continuing mission to provide you with exceptional heart care, we have created designated Provider Care Teams.  These Care Teams include your primary Cardiologist (physician) and Advanced Practice Providers (APPs -  Physician Assistants and Nurse Practitioners) who all work together to provide you with the care you need, when you need it.  We recommend signing up for the patient portal called "MyChart".  Sign up information is provided on this After Visit Summary.  MyChart is used to connect with patients for Virtual Visits (Telemedicine).  Patients are able to view lab/test results, encounter notes, upcoming appointments, etc.  Non-urgent messages can be sent to your provider as well.   To learn more about what you can do with MyChart, go to [email or web address].    Your next appointment:   6 month(s)  Provider:   Jodelle Red, MD    Other Instructions Thank you for choosing [facility]!       Signed, Nicki Guadalajara, MD   10/31/2023 3:58 PM    [facility] [address], [place], [place]  [address] Phone: [phone]

## 2023-10-27 NOTE — Patient Instructions (Addendum)
 Medication Instructions:  Begin Amlodipine 2.5mg  nightly at bedtime.  *If you need a refill on your cardiac medications before your next appointment, please call your pharmacy*   Lab Work: No labs were ordered during today's visit.  If you have labs (blood work) drawn today and your tests are completely normal, you will receive your results only by: MyChart Message (if you have MyChart) OR A paper copy in the mail If you have any lab test that is abnormal or we need to change your treatment, we will call you to review the results.   Testing/Procedures: No procedures were ordered during today's visit.    Follow-Up: At [facility], you and your health needs are our priority.  As part of our continuing mission to provide you with exceptional heart care, we have created designated Provider Care Teams.  These Care Teams include your primary Cardiologist (physician) and Advanced Practice Providers (APPs -  Physician Assistants and Nurse Practitioners) who all work together to provide you with the care you need, when you need it.  We recommend signing up for the patient portal called "MyChart".  Sign up information is provided on this After Visit Summary.  MyChart is used to connect with patients for Virtual Visits (Telemedicine).  Patients are able to view lab/test results, encounter notes, upcoming appointments, etc.  Non-urgent messages can be sent to your provider as well.   To learn more about what you can do with MyChart, go to [email or web address].    Your next appointment:   6 month(s)  Provider:   Jodelle Red, MD    Other Instructions Thank you for choosing [facility]!

## 2023-10-31 ENCOUNTER — Encounter: Payer: Self-pay | Admitting: Cardiovascular Disease

## 2023-11-17 ENCOUNTER — Other Ambulatory Visit: Payer: Self-pay | Admitting: Nurse Practitioner

## 2023-11-24 ENCOUNTER — Other Ambulatory Visit (HOSPITAL_COMMUNITY): Payer: Self-pay

## 2023-11-25 ENCOUNTER — Encounter (HOSPITAL_COMMUNITY)
Admission: RE | Admit: 2023-11-25 | Discharge: 2023-11-25 | Disposition: A | Discharge status: Home / Self Care | Source: Ambulatory Visit | Attending: Internal Medicine | Admitting: Internal Medicine

## 2023-11-25 DIAGNOSIS — M810 Age-related osteoporosis without current pathological fracture: Secondary | ICD-10-CM | POA: Diagnosis not present

## 2023-11-25 MED ORDER — DENOSUMAB 60 MG/ML ~~LOC~~ SOSY
PREFILLED_SYRINGE | SUBCUTANEOUS | Status: AC
  Filled 2023-11-25: qty 1

## 2023-11-25 MED ORDER — DENOSUMAB 60 MG/ML ~~LOC~~ SOSY
60.0000 mg | PREFILLED_SYRINGE | Freq: Once | SUBCUTANEOUS | Status: AC
  Administered 2023-11-25: 60 mg via SUBCUTANEOUS

## 2023-12-14 ENCOUNTER — Other Ambulatory Visit: Payer: Self-pay | Admitting: Cardiovascular Disease

## 2023-12-22 DIAGNOSIS — R5383 Other fatigue: Secondary | ICD-10-CM | POA: Diagnosis not present

## 2023-12-22 DIAGNOSIS — R7989 Other specified abnormal findings of blood chemistry: Secondary | ICD-10-CM | POA: Diagnosis not present

## 2023-12-22 DIAGNOSIS — N3941 Urge incontinence: Secondary | ICD-10-CM | POA: Diagnosis not present

## 2023-12-22 DIAGNOSIS — S61411A Laceration without foreign body of right hand, initial encounter: Secondary | ICD-10-CM | POA: Diagnosis not present

## 2023-12-22 DIAGNOSIS — N184 Chronic kidney disease, stage 4 (severe): Secondary | ICD-10-CM | POA: Diagnosis not present

## 2023-12-22 DIAGNOSIS — I129 Hypertensive chronic kidney disease with stage 1 through stage 4 chronic kidney disease, or unspecified chronic kidney disease: Secondary | ICD-10-CM | POA: Diagnosis not present

## 2023-12-22 DIAGNOSIS — R109 Unspecified abdominal pain: Secondary | ICD-10-CM | POA: Diagnosis not present

## 2023-12-22 DIAGNOSIS — D649 Anemia, unspecified: Secondary | ICD-10-CM | POA: Diagnosis not present

## 2023-12-22 DIAGNOSIS — N390 Urinary tract infection, site not specified: Secondary | ICD-10-CM | POA: Diagnosis not present

## 2023-12-22 DIAGNOSIS — D631 Anemia in chronic kidney disease: Secondary | ICD-10-CM | POA: Diagnosis not present

## 2023-12-22 DIAGNOSIS — E1129 Type 2 diabetes mellitus with other diabetic kidney complication: Secondary | ICD-10-CM | POA: Diagnosis not present

## 2023-12-22 DIAGNOSIS — E039 Hypothyroidism, unspecified: Secondary | ICD-10-CM | POA: Diagnosis not present

## 2023-12-22 DIAGNOSIS — W540XXA Bitten by dog, initial encounter: Secondary | ICD-10-CM | POA: Diagnosis not present

## 2023-12-25 ENCOUNTER — Emergency Department (HOSPITAL_COMMUNITY)
Admission: EM | Admit: 2023-12-25 | Discharge: 2023-12-26 | Disposition: A | Discharge status: Home / Self Care | Attending: Emergency Medicine | Admitting: Emergency Medicine

## 2023-12-25 ENCOUNTER — Emergency Department (HOSPITAL_COMMUNITY)

## 2023-12-25 ENCOUNTER — Other Ambulatory Visit: Payer: Self-pay

## 2023-12-25 ENCOUNTER — Encounter (HOSPITAL_COMMUNITY): Payer: Self-pay | Admitting: *Deleted

## 2023-12-25 DIAGNOSIS — R079 Chest pain, unspecified: Secondary | ICD-10-CM | POA: Diagnosis not present

## 2023-12-25 DIAGNOSIS — Z7982 Long term (current) use of aspirin: Secondary | ICD-10-CM | POA: Diagnosis not present

## 2023-12-25 DIAGNOSIS — H9203 Otalgia, bilateral: Secondary | ICD-10-CM | POA: Diagnosis not present

## 2023-12-25 DIAGNOSIS — H9202 Otalgia, left ear: Secondary | ICD-10-CM | POA: Diagnosis not present

## 2023-12-25 DIAGNOSIS — R0789 Other chest pain: Secondary | ICD-10-CM | POA: Diagnosis not present

## 2023-12-25 DIAGNOSIS — M25531 Pain in right wrist: Secondary | ICD-10-CM | POA: Diagnosis not present

## 2023-12-25 DIAGNOSIS — R0602 Shortness of breath: Secondary | ICD-10-CM | POA: Diagnosis not present

## 2023-12-25 DIAGNOSIS — R911 Solitary pulmonary nodule: Secondary | ICD-10-CM | POA: Diagnosis not present

## 2023-12-25 LAB — BASIC METABOLIC PANEL WITH GFR
Anion gap: 12 (ref 5–15)
BUN: 23 mg/dL (ref 8–23)
CO2: 16 mmol/L — ABNORMAL LOW (ref 22–32)
Calcium: 8.7 mg/dL — ABNORMAL LOW (ref 8.9–10.3)
Chloride: 100 mmol/L (ref 98–111)
Creatinine, Ser: 2 mg/dL — ABNORMAL HIGH (ref 0.44–1.00)
GFR, Estimated: 26 mL/min — ABNORMAL LOW (ref >60)
Glucose, Bld: 186 mg/dL — ABNORMAL HIGH (ref 70–99)
Potassium: 4.2 mmol/L (ref 3.5–5.1)
Sodium: 128 mmol/L — ABNORMAL LOW (ref 135–145)

## 2023-12-25 LAB — CBC
HCT: 36.8 % (ref 36.0–46.0)
Hemoglobin: 11.9 g/dL — ABNORMAL LOW (ref 12.0–15.0)
MCH: 31.1 pg (ref 26.0–34.0)
MCHC: 32.3 g/dL (ref 30.0–36.0)
MCV: 96.1 fL (ref 80.0–100.0)
Platelets: 217 10*3/uL (ref 150–400)
RBC: 3.83 MIL/uL — ABNORMAL LOW (ref 3.87–5.11)
RDW: 12.4 % (ref 11.5–15.5)
WBC: 10.1 10*3/uL (ref 4.0–10.5)
nRBC: 0 % (ref 0.0–0.2)

## 2023-12-25 LAB — TROPONIN I (HIGH SENSITIVITY)
Troponin I (High Sensitivity): 14 ng/L (ref <18)
Troponin I (High Sensitivity): 15 ng/L (ref <18)

## 2023-12-25 MED ORDER — NEOMYCIN-POLYMYXIN-HC 3.5-10000-1 OT SOLN
3.0000 [drp] | Freq: Once | OTIC | Status: AC
  Administered 2023-12-25: 3 [drp] via OTIC
  Filled 2023-12-25: qty 10

## 2023-12-25 MED ORDER — SODIUM CHLORIDE 0.9 % IV BOLUS
500.0000 mL | Freq: Once | INTRAVENOUS | Status: AC
  Administered 2023-12-25: 500 mL via INTRAVENOUS

## 2023-12-25 NOTE — Discharge Instructions (Addendum)
 Today's evaluation has been reassuring.  Please be sure to follow-up with your physician.  Use the provided eardrops 3 times daily for the next 5 days.  Return here for concerning changes in your condition.

## 2023-12-25 NOTE — ED Provider Notes (Signed)
 [place] [facility] AT [facility] Provider Note   CSN: [id number] Arrival date & time: 12/25/23  2029     History {Add pertinent medical, surgical, social history, OB history to HPI:1} Chief Complaint  Patient presents with   Chest Pain    Jessica [name] is a 73 y.o. female.  HPI Patient presents with chest pain.  Onset was 3 hours ago, while she was at church.  She notes that she had right arm pain, which migrated to her anterior chest, jaw.  No syncope, no fever, no vomiting, no nausea.  With ongoing chest discomfort she called for transport.  Patient received nitroglycerin  with some improvement in transport.  Patient was bitten by her dog 4 days ago, saw her physician, started on oral antibiotics.  Since that time she has mild pain persistently in the right wrist, no substantial spread, no inability to move the hand, wrist.  Right arm is where her pain began earlier tonight    Home Medications Prior to Admission medications   Medication Sig Start Date End Date Taking? Authorizing Provider  acetaminophen  (TYLENOL ) 325 MG tablet Take 2 tablets (650 mg total) by mouth every 6 (six) hours as needed. Patient not taking: Reported on 02/08/2023 09/29/22   Oral Billings, MD  amLODipine  (NORVASC ) 2.5 MG tablet Take 1 tablet (2.5 mg total) by mouth daily. 10/27/23 01/25/24  Millicent Ally, MD  aspirin  EC 81 MG tablet Take 81 mg by mouth daily. Swallow whole.    [provider]  Coenzyme Q10 (CO Q 10 PO) Take 1 capsule by mouth daily.    [provider]  cyanocobalamin  (,VITAMIN B-12,) 1000 MCG/ML injection Inject 1,000 mcg into the muscle See admin instructions. 1000 mcg on the 10th of every month, every 30 days. 11/02/19   [provider]  Dulaglutide (TRULICITY) 1.5 MG/0.5ML SOPN Inject 1.5 mg into the skin every Wednesday. 03/20/21   [provider]  empagliflozin  (JARDIANCE ) 10 MG TABS tablet Take 1 tablet (10 mg total) by mouth daily.  03/25/21   Kroeger, Krista M., PA-C  famotidine  (PEPCID ) 40 MG tablet TAKE 1 TABLET TWICE DAILY 04/12/23   Graciella Lavender, PA  hydrALAZINE  (APRESOLINE ) 10 MG tablet Take 1 tablet (10 mg total) by mouth 3 (three) times daily as needed. hydralazine  10 mg as needed systolic blood pressure (top number) greater than 160. 02/08/23   Monge, Nelva Bang, NP  levothyroxine  (SYNTHROID ) 50 MCG tablet Take 75 mcg by mouth daily before breakfast.    [provider]  LORazepam  (ATIVAN ) 0.5 MG tablet Take 0.5 mg by mouth 2 (two) times daily as needed for anxiety.    [provider]  magnesium  oxide (MAG-OX) 400 MG tablet Take 400 mg by mouth daily.    [provider]  methocarbamol  (ROBAXIN ) 500 MG tablet Take 1 tablet (500 mg total) by mouth every 8 (eight) hours as needed for muscle spasms. Patient not taking: Reported on 10/27/2023 12/22/22   Maczis, Michael M, PA-C  metoprolol  succinate (TOPROL -XL) 25 MG 24 hr tablet TAKE 1.5 TABLETS BY MOUTH DAILY. 12/14/23   Millicent Ally, MD  nitroGLYCERIN  (NITROSTAT ) 0.4 MG SL tablet Place 1 tablet (0.4 mg total) under the tongue every 5 (five) minutes x 3 doses as needed for chest pain. 02/08/23   Jude Norton, NP  ondansetron  (ZOFRAN -ODT) 4 MG disintegrating tablet Take 4 mg by mouth every 6 (six) hours as needed. Patient not taking: Reported on 10/27/2023 12/29/22  [provider]  pantoprazole  (PROTONIX ) 40 MG tablet Take 40 mg by mouth 2 (two) times daily. 03/22/20   [provider]  Probiotic Product (PROBIOTIC PO) Take 1 capsule by mouth 2 (two) times daily.    [provider]  rosuvastatin  (CRESTOR ) 40 MG tablet TAKE 1 TABLET EVERY DAY 11/17/23   Jude Norton, NP  sertraline  (ZOLOFT ) 50 MG tablet Take 50 mg by mouth at bedtime.  03/06/20   [provider]      Allergies    Codeine, Erythromycin, Levaquin [levofloxacin], Motrin [ibuprofen], and Ultram  [tramadol ]    Review of Systems   Review of  Systems  Physical Exam Updated Vital Signs BP (!) 147/95 (BP Location: Right Arm)   Pulse 94   Temp 98.2 F (36.8 C)   Resp 20   Ht 5\' 5"  (1.651 m)   Wt 55.2 kg   SpO2 100%   BMI 20.25 kg/m  Physical Exam Vitals and nursing note reviewed.  Constitutional:      General: She is not in acute distress.    Appearance: She is well-developed.     Comments: Blind  HENT:     Head: Normocephalic and atraumatic.  Eyes:     Conjunctiva/sclera: Conjunctivae normal.  Cardiovascular:     Rate and Rhythm: Normal rate and regular rhythm.  Pulmonary:     Effort: Pulmonary effort is normal. No respiratory distress.     Breath sounds: Normal breath sounds. No stridor.  Abdominal:     General: There is no distension.  Skin:    General: Skin is warm and dry.     Comments: Right forearm with wound dressing in place no surrounding erythema.  Neurological:     Mental Status: She is alert and oriented to person, place, and time.     Cranial Nerves: No cranial nerve deficit.  Psychiatric:        Mood and Affect: Mood normal.     ED Results / Procedures / Treatments   Labs (all labs ordered are listed, but only abnormal results are displayed) Labs Reviewed  BASIC METABOLIC PANEL WITH GFR - Abnormal; Notable for the following components:      Result Value   Sodium 128 (*)    CO2 16 (*)    Glucose, Bld 186 (*)    Creatinine, Ser 2.00 (*)    Calcium  8.7 (*)    GFR, Estimated 26 (*)    All other components within normal limits  CBC - Abnormal; Notable for the following components:   RBC 3.83 (*)    Hemoglobin 11.9 (*)    All other components within normal limits  TROPONIN I (HIGH SENSITIVITY)  TROPONIN I (HIGH SENSITIVITY)    EKG EKG Interpretation Date/Time:  Sunday December 25 2023 20:36:09 EDT Ventricular Rate:  95 PR Interval:  174 QRS Duration:  78 QT Interval:  366 QTC Calculation: 459 R Axis:   28  Text Interpretation: Normal sinus rhythm Low voltage QRS Cannot rule out  Anterior infarct , age undetermined Abnormal ECG Confirmed by Dorenda Gandy [phone]) on 12/25/2023 9:17:38 PM  Radiology DG Chest 2 View Result Date: 12/25/2023 EXAM: 2 VIEW(S) XRAY OF THE CHEST 12/25/2023 09:54:00 PM COMPARISON: CT chest 12/19/2022 CLINICAL HISTORY: Chest pain and shortness of breath earlier tonight. The patient started having chest pain while at church, originally with right wrist pain, then shortness of breath and chest pain. She took an aspirin  and a sublingual nitroglycerin  that did not help her pain.  FINDINGS: LUNGS AND PLEURA: Stable lingular nodule, better evaluated on prior CT, favored to be benign. No pulmonary edema. No pleural effusion. No pneumothorax. HEART AND MEDIASTINUM: No acute abnormality of the cardiac and mediastinal silhouettes. BONES AND SOFT TISSUES: No acute osseous abnormality. IMPRESSION: 1. No acute findings. 2. Stable lingular nodule, better evaluated on prior CT, favored to be benign. 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  Patient is awake and alert, hemodynamically unremarkable, no increased work of breathing low suspicion for PE, no evidence for pneumonia. Cardiac 85 sinus normal pulse ox 100% room air normal  Amount and/or Complexity of Data Reviewed Independent Historian: spouse External Data Reviewed: notes. Labs: ordered. Decision-making details  documented in ED Course. Radiology: ordered and independent interpretation performed. Decision-making details documented in ED Course. ECG/medicine tests: ordered and independent interpretation performed. Decision-making details documented in ED Course.  Risk Prescription drug management.   10:52 PM On repeat exam now coming by her daughter.  Daughter notes that the patient also complained of left ear pain, and clarifying this the patient notes that she has had left ear pain with a fullness sensation for some time, has discussed this with her physician, has not had further follow-up yet.  Now with initial troponin normal, remaining labs consistent with prior aside from slightly worse hyponatremia.  No evidence for cellulitis on repeat exam.  Patient awaiting second troponin.  Final Clinical Impression(s) / ED Diagnoses Final diagnoses:  Atypical chest pain  Left ear pain

## 2023-12-25 NOTE — ED Triage Notes (Signed)
 The pt  started having chest pain while she was at church 1800  she originally had rt wrist pain initially then sob and chest pain  she took an aspirin  and a sl nitroglycerin   that did not help her pain

## 2023-12-26 NOTE — ED Provider Notes (Signed)
 I assumed care of this patient from previous provider.  Please see their note for further details of history, exam, and MDM.   Briefly patient is a 73 y.o. female who presented with chest pain. W/u thus far is reassuring pending delta trop. DC if negative.  Trop negative.   The patient appears reasonably screened and/or stabilized for discharge and I doubt any other medical condition or other [facility] requiring further screening, evaluation, or treatment in the ED at this time. I have discussed the findings, Dx and Tx plan with the patient/family who expressed understanding and agree(s) with the plan. Discharge instructions discussed at length. The patient/family was given strict return precautions who verbalized understanding of the instructions. No further questions at time of discharge.  Disposition: Discharge  Condition: Good  ED Discharge Orders     None       Follow Up: [facility], [facility] [address] [place] [address] [phone]   ENT as needed if your ear pain does not improve  Jeannine Milroy., MD [address] [place] [address] [phone]  Call in 1 day to ensure appropriate ongoing evaluation of your chest pain.      Lindle Rhea, MD 12/26/23 [phone]

## 2023-12-29 DIAGNOSIS — E538 Deficiency of other specified B group vitamins: Secondary | ICD-10-CM | POA: Diagnosis not present

## 2023-12-29 DIAGNOSIS — E039 Hypothyroidism, unspecified: Secondary | ICD-10-CM | POA: Diagnosis not present

## 2023-12-29 DIAGNOSIS — E871 Hypo-osmolality and hyponatremia: Secondary | ICD-10-CM | POA: Diagnosis not present

## 2023-12-29 DIAGNOSIS — D631 Anemia in chronic kidney disease: Secondary | ICD-10-CM | POA: Diagnosis not present

## 2023-12-29 DIAGNOSIS — R5383 Other fatigue: Secondary | ICD-10-CM | POA: Diagnosis not present

## 2023-12-29 DIAGNOSIS — E1129 Type 2 diabetes mellitus with other diabetic kidney complication: Secondary | ICD-10-CM | POA: Diagnosis not present

## 2023-12-30 ENCOUNTER — Other Ambulatory Visit: Payer: Self-pay | Admitting: Internal Medicine

## 2023-12-30 DIAGNOSIS — R109 Unspecified abdominal pain: Secondary | ICD-10-CM

## 2024-01-09 ENCOUNTER — Ambulatory Visit
Admission: RE | Admit: 2024-01-09 | Discharge: 2024-01-09 | Disposition: A | Discharge status: Home / Self Care | Source: Ambulatory Visit | Attending: Internal Medicine | Admitting: Internal Medicine

## 2024-01-09 DIAGNOSIS — M7989 Other specified soft tissue disorders: Secondary | ICD-10-CM | POA: Diagnosis not present

## 2024-01-09 DIAGNOSIS — R911 Solitary pulmonary nodule: Secondary | ICD-10-CM | POA: Diagnosis not present

## 2024-01-09 DIAGNOSIS — R109 Unspecified abdominal pain: Secondary | ICD-10-CM

## 2024-01-09 DIAGNOSIS — Z9049 Acquired absence of other specified parts of digestive tract: Secondary | ICD-10-CM | POA: Diagnosis not present

## 2024-02-13 ENCOUNTER — Other Ambulatory Visit: Payer: Self-pay | Admitting: Physician Assistant

## 2024-03-02 ENCOUNTER — Telehealth: Payer: Self-pay | Admitting: Internal Medicine

## 2024-03-02 NOTE — Telephone Encounter (Signed)
 I have spoken to patient about esophageal manometry scheduled for 03/07/24. She says she has never been given instructions for manometry prep and was never told that she was scheduled. She does not recall getting instructions (but printed on after visit summary at 10/11/23 office visit). I have made these instructions available again through MyChart message as per patient request and have given verbal prep instructions as well. Patient asks about what manometry testing is for and how the test is done. This information was explained to patient and she had no further questions by the end of our call.

## 2024-03-02 NOTE — Telephone Encounter (Signed)
 Patient called and stated that she has a procedure schedule at the hospital for August the 13 th and she was just advised of what she is not allowed to take. Patient took her Trulicity injection yesterday and was now wondering if we need to reschedule her procedure. Please advise.

## 2024-03-07 ENCOUNTER — Encounter (HOSPITAL_COMMUNITY)
Admission: RE | Disposition: A | Discharge status: Home / Self Care | Payer: Self-pay | Source: Ambulatory Visit | Attending: Internal Medicine

## 2024-03-07 ENCOUNTER — Ambulatory Visit (HOSPITAL_COMMUNITY)
Admission: RE | Admit: 2024-03-07 | Discharge: 2024-03-07 | Disposition: A | Discharge status: Home / Self Care | Source: Ambulatory Visit | Attending: Internal Medicine | Admitting: Internal Medicine

## 2024-03-07 ENCOUNTER — Encounter (HOSPITAL_COMMUNITY): Payer: Self-pay | Admitting: Internal Medicine

## 2024-03-07 DIAGNOSIS — R1310 Dysphagia, unspecified: Secondary | ICD-10-CM | POA: Diagnosis not present

## 2024-03-07 HISTORY — PX: ESOPHAGEAL MANOMETRY: SHX5429

## 2024-03-07 SURGERY — MANOMETRY, ESOPHAGUS

## 2024-03-07 MED ORDER — LIDOCAINE VISCOUS HCL 2 % MT SOLN
OROMUCOSAL | Status: AC
  Filled 2024-03-07: qty 15

## 2024-03-07 SURGICAL SUPPLY — 2 items
FACESHIELD LNG OPTICON STERILE (SAFETY) IMPLANT
GLOVE BIO SURGEON STRL SZ8 (GLOVE) ×2 IMPLANT

## 2024-03-07 NOTE — Progress Notes (Signed)
 Esophageal manometry performed per protocol without complications.  Patient tolerated with some vomiting of saline.

## 2024-03-08 ENCOUNTER — Encounter (HOSPITAL_COMMUNITY): Payer: Self-pay | Admitting: Internal Medicine

## 2024-03-21 DIAGNOSIS — I129 Hypertensive chronic kidney disease with stage 1 through stage 4 chronic kidney disease, or unspecified chronic kidney disease: Secondary | ICD-10-CM | POA: Diagnosis not present

## 2024-03-21 DIAGNOSIS — I2510 Atherosclerotic heart disease of native coronary artery without angina pectoris: Secondary | ICD-10-CM | POA: Diagnosis not present

## 2024-03-21 DIAGNOSIS — E1129 Type 2 diabetes mellitus with other diabetic kidney complication: Secondary | ICD-10-CM | POA: Diagnosis not present

## 2024-03-21 DIAGNOSIS — Z23 Encounter for immunization: Secondary | ICD-10-CM | POA: Diagnosis not present

## 2024-03-21 DIAGNOSIS — N184 Chronic kidney disease, stage 4 (severe): Secondary | ICD-10-CM | POA: Diagnosis not present

## 2024-03-23 ENCOUNTER — Telehealth: Payer: Self-pay | Admitting: Internal Medicine

## 2024-03-23 NOTE — Telephone Encounter (Signed)
 Inbound call from patient requesting to speak with nurse in regarding an update of 8/13 manometry results. Please advise, thank you

## 2024-03-23 NOTE — Telephone Encounter (Signed)
 Patient is advised that manometry results typically take about 14 days to be read. Advised that when results are available, we will certainly be in contact with her. 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

## 2024-03-29 NOTE — Telephone Encounter (Signed)
 Dr Shila-  Have you seen manometry information come through on this patient?

## 2024-03-29 NOTE — Telephone Encounter (Signed)
 Inbound call from pt requesting to speak with the nurse in regards to her manometry results. Please advise.

## 2024-04-11 NOTE — Telephone Encounter (Signed)
 Normal study, no significant abnormality.Please inform patient the results. Thanks

## 2024-04-11 NOTE — Telephone Encounter (Signed)
 Patient has been advised of manometry which was normal with no significant abnormalities. Patient verbalizes understanding.

## 2024-04-12 ENCOUNTER — Ambulatory Visit: Payer: Self-pay | Admitting: Internal Medicine

## 2024-04-12 DIAGNOSIS — F419 Anxiety disorder, unspecified: Secondary | ICD-10-CM | POA: Diagnosis not present

## 2024-04-12 DIAGNOSIS — H543 Unqualified visual loss, both eyes: Secondary | ICD-10-CM | POA: Diagnosis not present

## 2024-04-12 DIAGNOSIS — I129 Hypertensive chronic kidney disease with stage 1 through stage 4 chronic kidney disease, or unspecified chronic kidney disease: Secondary | ICD-10-CM | POA: Diagnosis not present

## 2024-04-12 DIAGNOSIS — N184 Chronic kidney disease, stage 4 (severe): Secondary | ICD-10-CM | POA: Diagnosis not present

## 2024-04-12 DIAGNOSIS — K219 Gastro-esophageal reflux disease without esophagitis: Secondary | ICD-10-CM | POA: Diagnosis not present

## 2024-04-12 DIAGNOSIS — E785 Hyperlipidemia, unspecified: Secondary | ICD-10-CM | POA: Diagnosis not present

## 2024-04-12 DIAGNOSIS — D649 Anemia, unspecified: Secondary | ICD-10-CM | POA: Diagnosis not present

## 2024-04-12 DIAGNOSIS — E1122 Type 2 diabetes mellitus with diabetic chronic kidney disease: Secondary | ICD-10-CM | POA: Diagnosis not present

## 2024-04-13 ENCOUNTER — Telehealth (HOSPITAL_COMMUNITY): Payer: Self-pay | Admitting: Pharmacy Technician

## 2024-04-13 ENCOUNTER — Other Ambulatory Visit (HOSPITAL_COMMUNITY): Payer: Self-pay | Admitting: Internal Medicine

## 2024-04-13 DIAGNOSIS — M810 Age-related osteoporosis without current pathological fracture: Secondary | ICD-10-CM | POA: Insufficient documentation

## 2024-04-13 NOTE — Telephone Encounter (Addendum)
 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

## 2024-04-24 DIAGNOSIS — Z881 Allergy status to other antibiotic agents status: Secondary | ICD-10-CM | POA: Diagnosis not present

## 2024-04-24 DIAGNOSIS — Z87440 Personal history of urinary (tract) infections: Secondary | ICD-10-CM | POA: Diagnosis not present

## 2024-04-24 DIAGNOSIS — I25119 Atherosclerotic heart disease of native coronary artery with unspecified angina pectoris: Secondary | ICD-10-CM | POA: Diagnosis not present

## 2024-04-24 DIAGNOSIS — F411 Generalized anxiety disorder: Secondary | ICD-10-CM | POA: Diagnosis not present

## 2024-04-24 DIAGNOSIS — Z79620 Long term (current) use of immunosuppressive biologic: Secondary | ICD-10-CM | POA: Diagnosis not present

## 2024-04-24 DIAGNOSIS — Z8673 Personal history of transient ischemic attack (TIA), and cerebral infarction without residual deficits: Secondary | ICD-10-CM | POA: Diagnosis not present

## 2024-04-24 DIAGNOSIS — N2581 Secondary hyperparathyroidism of renal origin: Secondary | ICD-10-CM | POA: Diagnosis not present

## 2024-04-24 DIAGNOSIS — Z7982 Long term (current) use of aspirin: Secondary | ICD-10-CM | POA: Diagnosis not present

## 2024-04-24 DIAGNOSIS — I129 Hypertensive chronic kidney disease with stage 1 through stage 4 chronic kidney disease, or unspecified chronic kidney disease: Secondary | ICD-10-CM | POA: Diagnosis not present

## 2024-04-24 DIAGNOSIS — K224 Dyskinesia of esophagus: Secondary | ICD-10-CM | POA: Diagnosis not present

## 2024-04-24 DIAGNOSIS — F329 Major depressive disorder, single episode, unspecified: Secondary | ICD-10-CM | POA: Diagnosis not present

## 2024-04-24 DIAGNOSIS — K5900 Constipation, unspecified: Secondary | ICD-10-CM | POA: Diagnosis not present

## 2024-04-24 DIAGNOSIS — Z8249 Family history of ischemic heart disease and other diseases of the circulatory system: Secondary | ICD-10-CM | POA: Diagnosis not present

## 2024-04-24 DIAGNOSIS — Z79890 Hormone replacement therapy (postmenopausal): Secondary | ICD-10-CM | POA: Diagnosis not present

## 2024-04-24 DIAGNOSIS — M810 Age-related osteoporosis without current pathological fracture: Secondary | ICD-10-CM | POA: Diagnosis not present

## 2024-04-24 DIAGNOSIS — E1122 Type 2 diabetes mellitus with diabetic chronic kidney disease: Secondary | ICD-10-CM | POA: Diagnosis not present

## 2024-04-24 DIAGNOSIS — N184 Chronic kidney disease, stage 4 (severe): Secondary | ICD-10-CM | POA: Diagnosis not present

## 2024-04-24 DIAGNOSIS — H548 Legal blindness, as defined in USA: Secondary | ICD-10-CM | POA: Diagnosis not present

## 2024-04-24 DIAGNOSIS — K219 Gastro-esophageal reflux disease without esophagitis: Secondary | ICD-10-CM | POA: Diagnosis not present

## 2024-04-24 DIAGNOSIS — E785 Hyperlipidemia, unspecified: Secondary | ICD-10-CM | POA: Diagnosis not present

## 2024-04-24 DIAGNOSIS — N3941 Urge incontinence: Secondary | ICD-10-CM | POA: Diagnosis not present

## 2024-04-24 DIAGNOSIS — I252 Old myocardial infarction: Secondary | ICD-10-CM | POA: Diagnosis not present

## 2024-04-24 DIAGNOSIS — E890 Postprocedural hypothyroidism: Secondary | ICD-10-CM | POA: Diagnosis not present

## 2024-04-24 DIAGNOSIS — I700 Atherosclerosis of aorta: Secondary | ICD-10-CM | POA: Diagnosis not present

## 2024-04-24 DIAGNOSIS — D519 Vitamin B12 deficiency anemia, unspecified: Secondary | ICD-10-CM | POA: Diagnosis not present

## 2024-04-24 DIAGNOSIS — Z823 Family history of stroke: Secondary | ICD-10-CM | POA: Diagnosis not present

## 2024-04-24 DIAGNOSIS — Z9181 History of falling: Secondary | ICD-10-CM | POA: Diagnosis not present

## 2024-05-28 ENCOUNTER — Inpatient Hospital Stay (HOSPITAL_COMMUNITY): Admission: RE | Admit: 2024-05-28 | Source: Ambulatory Visit

## 2024-06-14 ENCOUNTER — Ambulatory Visit (HOSPITAL_COMMUNITY)
Admission: RE | Admit: 2024-06-14 | Discharge: 2024-06-14 | Disposition: A | Discharge status: Home / Self Care | Source: Ambulatory Visit | Attending: Internal Medicine | Admitting: Internal Medicine

## 2024-06-14 VITALS — BP 149/86 | HR 73 | Temp 97.6°F | Resp 16

## 2024-06-14 DIAGNOSIS — M810 Age-related osteoporosis without current pathological fracture: Secondary | ICD-10-CM | POA: Diagnosis not present

## 2024-06-14 MED ORDER — DENOSUMAB 60 MG/ML ~~LOC~~ SOSY
PREFILLED_SYRINGE | SUBCUTANEOUS | Status: AC
  Filled 2024-06-14: qty 1

## 2024-06-14 MED ORDER — DENOSUMAB 60 MG/ML ~~LOC~~ SOSY
60.0000 mg | PREFILLED_SYRINGE | Freq: Once | SUBCUTANEOUS | Status: AC
  Administered 2024-06-14: 60 mg via SUBCUTANEOUS

## 2024-11-26 ENCOUNTER — Encounter (HOSPITAL_COMMUNITY): Payer: Self-pay

## 2024-12-13 ENCOUNTER — Encounter (HOSPITAL_COMMUNITY): Payer: Self-pay
